# Patient Record
Sex: Female | Born: 1935
Health system: Southern US, Community
[De-identification: ages and names within clinical notes are randomized; demographics above are authoritative.]

## PROBLEM LIST (undated history)

## (undated) DIAGNOSIS — K219 Gastro-esophageal reflux disease without esophagitis: Secondary | ICD-10-CM

## (undated) DIAGNOSIS — Z8739 Personal history of other diseases of the musculoskeletal system and connective tissue: Secondary | ICD-10-CM

## (undated) DIAGNOSIS — I639 Cerebral infarction, unspecified: Secondary | ICD-10-CM

## (undated) DIAGNOSIS — K222 Esophageal obstruction: Secondary | ICD-10-CM

## (undated) DIAGNOSIS — D126 Benign neoplasm of colon, unspecified: Secondary | ICD-10-CM

## (undated) DIAGNOSIS — I495 Sick sinus syndrome: Secondary | ICD-10-CM

## (undated) DIAGNOSIS — K2210 Ulcer of esophagus without bleeding: Secondary | ICD-10-CM

## (undated) DIAGNOSIS — M1990 Unspecified osteoarthritis, unspecified site: Secondary | ICD-10-CM

## (undated) DIAGNOSIS — I2510 Atherosclerotic heart disease of native coronary artery without angina pectoris: Secondary | ICD-10-CM

## (undated) DIAGNOSIS — K449 Diaphragmatic hernia without obstruction or gangrene: Secondary | ICD-10-CM

## (undated) DIAGNOSIS — E785 Hyperlipidemia, unspecified: Secondary | ICD-10-CM

## (undated) DIAGNOSIS — E039 Hypothyroidism, unspecified: Secondary | ICD-10-CM

## (undated) DIAGNOSIS — K5790 Diverticulosis of intestine, part unspecified, without perforation or abscess without bleeding: Secondary | ICD-10-CM

## (undated) DIAGNOSIS — I480 Paroxysmal atrial fibrillation: Secondary | ICD-10-CM

## (undated) DIAGNOSIS — I10 Essential (primary) hypertension: Secondary | ICD-10-CM

## (undated) DIAGNOSIS — F0390 Unspecified dementia without behavioral disturbance: Secondary | ICD-10-CM

## (undated) DIAGNOSIS — E669 Obesity, unspecified: Secondary | ICD-10-CM

## (undated) HISTORY — PX: TONSILLECTOMY AND ADENOIDECTOMY: SUR1326

## (undated) HISTORY — DX: Sick sinus syndrome: I49.5

## (undated) HISTORY — DX: Unspecified osteoarthritis, unspecified site: M19.90

## (undated) HISTORY — PX: CHOLECYSTECTOMY: SHX55

## (undated) HISTORY — DX: Paroxysmal atrial fibrillation: I48.0

## (undated) HISTORY — DX: Hyperlipidemia, unspecified: E78.5

## (undated) HISTORY — DX: Hypothyroidism, unspecified: E03.9

## (undated) HISTORY — DX: Benign neoplasm of colon, unspecified: D12.6

## (undated) HISTORY — DX: Diaphragmatic hernia without obstruction or gangrene: K44.9

## (undated) HISTORY — DX: Obesity, unspecified: E66.9

## (undated) HISTORY — DX: Diverticulosis of intestine, part unspecified, without perforation or abscess without bleeding: K57.90

## (undated) HISTORY — PX: APPENDECTOMY: SHX54

## (undated) HISTORY — DX: Essential (primary) hypertension: I10

## (undated) HISTORY — DX: Gastro-esophageal reflux disease without esophagitis: K21.9

## (undated) HISTORY — DX: Ulcer of esophagus without bleeding: K22.10

## (undated) HISTORY — PX: TOTAL ABDOMINAL HYSTERECTOMY W/ BILATERAL SALPINGOOPHORECTOMY: SHX83

## (undated) HISTORY — DX: Cerebral infarction, unspecified: I63.9

## (undated) HISTORY — DX: Atherosclerotic heart disease of native coronary artery without angina pectoris: I25.10

## (undated) HISTORY — DX: Esophageal obstruction: K22.2

---

## 1997-01-17 ENCOUNTER — Encounter: Payer: Self-pay | Admitting: Gastroenterology

## 1998-02-13 ENCOUNTER — Other Ambulatory Visit: Admission: RE | Admit: 1998-02-13 | Discharge: 1998-02-13 | Payer: Self-pay | Admitting: Radiology

## 1998-03-29 ENCOUNTER — Encounter: Payer: Self-pay | Admitting: Gastroenterology

## 1999-03-24 ENCOUNTER — Encounter: Payer: Self-pay | Admitting: Internal Medicine

## 1999-03-24 ENCOUNTER — Ambulatory Visit (HOSPITAL_COMMUNITY): Admission: RE | Admit: 1999-03-24 | Discharge: 1999-03-24 | Payer: Self-pay | Admitting: Internal Medicine

## 1999-10-02 ENCOUNTER — Encounter: Admission: RE | Admit: 1999-10-02 | Discharge: 1999-11-04 | Payer: Self-pay | Admitting: Rheumatology

## 2000-02-22 HISTORY — PX: CORONARY ARTERY BYPASS GRAFT: SHX141

## 2000-03-13 ENCOUNTER — Encounter: Payer: Self-pay | Admitting: Emergency Medicine

## 2000-03-13 ENCOUNTER — Inpatient Hospital Stay (HOSPITAL_COMMUNITY): Admission: EM | Admit: 2000-03-13 | Discharge: 2000-03-26 | Payer: Self-pay | Admitting: Emergency Medicine

## 2000-03-16 ENCOUNTER — Encounter: Payer: Self-pay | Admitting: Internal Medicine

## 2000-03-16 ENCOUNTER — Encounter: Payer: Self-pay | Admitting: Cardiology

## 2000-03-18 ENCOUNTER — Encounter: Payer: Self-pay | Admitting: Thoracic Surgery (Cardiothoracic Vascular Surgery)

## 2000-03-19 ENCOUNTER — Encounter: Payer: Self-pay | Admitting: Thoracic Surgery (Cardiothoracic Vascular Surgery)

## 2000-03-20 ENCOUNTER — Encounter: Payer: Self-pay | Admitting: Thoracic Surgery (Cardiothoracic Vascular Surgery)

## 2000-03-21 ENCOUNTER — Encounter: Payer: Self-pay | Admitting: Thoracic Surgery (Cardiothoracic Vascular Surgery)

## 2000-03-22 ENCOUNTER — Encounter: Payer: Self-pay | Admitting: Thoracic Surgery (Cardiothoracic Vascular Surgery)

## 2000-03-24 ENCOUNTER — Encounter: Payer: Self-pay | Admitting: *Deleted

## 2000-03-25 HISTORY — PX: PACEMAKER PLACEMENT: SHX43

## 2000-03-26 ENCOUNTER — Encounter: Payer: Self-pay | Admitting: Cardiology

## 2000-04-12 ENCOUNTER — Encounter: Admission: RE | Admit: 2000-04-12 | Discharge: 2000-07-11 | Payer: Self-pay | Admitting: Internal Medicine

## 2000-05-18 ENCOUNTER — Encounter (HOSPITAL_COMMUNITY): Admission: RE | Admit: 2000-05-18 | Discharge: 2000-08-16 | Payer: Self-pay | Admitting: *Deleted

## 2001-02-11 ENCOUNTER — Encounter: Payer: Self-pay | Admitting: Emergency Medicine

## 2001-02-11 ENCOUNTER — Inpatient Hospital Stay (HOSPITAL_COMMUNITY): Admission: EM | Admit: 2001-02-11 | Discharge: 2001-02-13 | Payer: Self-pay | Admitting: Emergency Medicine

## 2001-02-13 ENCOUNTER — Encounter: Payer: Self-pay | Admitting: *Deleted

## 2001-11-23 HISTORY — PX: COLONOSCOPY W/ POLYPECTOMY: SHX1380

## 2002-07-24 DIAGNOSIS — D126 Benign neoplasm of colon, unspecified: Secondary | ICD-10-CM

## 2002-07-24 HISTORY — DX: Benign neoplasm of colon, unspecified: D12.6

## 2002-07-26 ENCOUNTER — Encounter: Payer: Self-pay | Admitting: Gastroenterology

## 2002-11-23 HISTORY — PX: REPLACEMENT TOTAL KNEE: SUR1224

## 2003-01-24 ENCOUNTER — Encounter: Payer: Self-pay | Admitting: Gastroenterology

## 2003-05-30 ENCOUNTER — Encounter: Admission: RE | Admit: 2003-05-30 | Discharge: 2003-05-30 | Payer: Self-pay | Admitting: Orthopedic Surgery

## 2003-05-30 ENCOUNTER — Encounter: Payer: Self-pay | Admitting: Orthopedic Surgery

## 2003-06-29 ENCOUNTER — Encounter: Payer: Self-pay | Admitting: Cardiology

## 2003-07-13 ENCOUNTER — Encounter: Payer: Self-pay | Admitting: Orthopedic Surgery

## 2003-07-18 ENCOUNTER — Inpatient Hospital Stay (HOSPITAL_COMMUNITY): Admission: RE | Admit: 2003-07-18 | Discharge: 2003-07-22 | Payer: Self-pay | Admitting: Orthopedic Surgery

## 2003-07-18 ENCOUNTER — Encounter: Payer: Self-pay | Admitting: Orthopedic Surgery

## 2003-07-20 ENCOUNTER — Encounter: Payer: Self-pay | Admitting: Orthopedic Surgery

## 2003-07-22 ENCOUNTER — Inpatient Hospital Stay (HOSPITAL_COMMUNITY)
Admission: RE | Admit: 2003-07-22 | Discharge: 2003-07-28 | Payer: Self-pay | Admitting: Physical Medicine & Rehabilitation

## 2004-06-10 ENCOUNTER — Encounter: Admission: RE | Admit: 2004-06-10 | Discharge: 2004-06-10 | Payer: Self-pay | Admitting: Otolaryngology

## 2004-06-11 ENCOUNTER — Ambulatory Visit (HOSPITAL_BASED_OUTPATIENT_CLINIC_OR_DEPARTMENT_OTHER): Admission: RE | Admit: 2004-06-11 | Discharge: 2004-06-11 | Payer: Self-pay | Admitting: Otolaryngology

## 2004-06-11 ENCOUNTER — Ambulatory Visit (HOSPITAL_COMMUNITY): Admission: RE | Admit: 2004-06-11 | Discharge: 2004-06-11 | Payer: Self-pay | Admitting: Otolaryngology

## 2004-12-20 ENCOUNTER — Ambulatory Visit: Payer: Self-pay | Admitting: Cardiology

## 2004-12-26 ENCOUNTER — Ambulatory Visit: Payer: Self-pay

## 2005-01-29 ENCOUNTER — Ambulatory Visit: Payer: Self-pay | Admitting: Cardiology

## 2005-03-03 ENCOUNTER — Ambulatory Visit: Payer: Self-pay | Admitting: Internal Medicine

## 2005-03-18 ENCOUNTER — Ambulatory Visit: Payer: Self-pay | Admitting: Cardiology

## 2005-04-17 ENCOUNTER — Ambulatory Visit: Payer: Self-pay | Admitting: Cardiology

## 2005-05-23 HISTORY — PX: CATARACT EXTRACTION: SUR2

## 2005-05-29 ENCOUNTER — Ambulatory Visit: Payer: Self-pay | Admitting: Cardiology

## 2005-06-30 ENCOUNTER — Ambulatory Visit: Payer: Self-pay | Admitting: Cardiology

## 2005-07-24 ENCOUNTER — Ambulatory Visit: Payer: Self-pay | Admitting: Gastroenterology

## 2005-08-05 ENCOUNTER — Ambulatory Visit: Payer: Self-pay | Admitting: Cardiology

## 2005-08-06 ENCOUNTER — Ambulatory Visit: Payer: Self-pay | Admitting: Gastroenterology

## 2005-08-06 ENCOUNTER — Encounter (INDEPENDENT_AMBULATORY_CARE_PROVIDER_SITE_OTHER): Payer: Self-pay | Admitting: Specialist

## 2005-09-17 ENCOUNTER — Ambulatory Visit: Payer: Self-pay | Admitting: Internal Medicine

## 2005-10-01 ENCOUNTER — Ambulatory Visit: Payer: Self-pay | Admitting: Internal Medicine

## 2005-10-06 ENCOUNTER — Ambulatory Visit: Payer: Self-pay | Admitting: Cardiology

## 2005-10-27 ENCOUNTER — Encounter: Admission: RE | Admit: 2005-10-27 | Discharge: 2005-11-22 | Payer: Self-pay | Admitting: Internal Medicine

## 2005-11-05 ENCOUNTER — Ambulatory Visit: Payer: Self-pay | Admitting: Cardiology

## 2005-11-26 ENCOUNTER — Ambulatory Visit: Payer: Self-pay | Admitting: Cardiology

## 2005-12-24 ENCOUNTER — Ambulatory Visit: Payer: Self-pay | Admitting: Cardiology

## 2006-01-26 ENCOUNTER — Ambulatory Visit: Payer: Self-pay | Admitting: Cardiology

## 2006-02-24 ENCOUNTER — Ambulatory Visit: Payer: Self-pay | Admitting: Cardiology

## 2006-04-06 ENCOUNTER — Ambulatory Visit: Payer: Self-pay | Admitting: Cardiology

## 2006-04-08 ENCOUNTER — Ambulatory Visit: Payer: Self-pay | Admitting: Internal Medicine

## 2006-05-10 ENCOUNTER — Ambulatory Visit: Payer: Self-pay | Admitting: Cardiology

## 2006-06-15 ENCOUNTER — Ambulatory Visit: Payer: Self-pay | Admitting: Cardiology

## 2006-07-21 ENCOUNTER — Ambulatory Visit: Payer: Self-pay | Admitting: Cardiology

## 2006-08-18 ENCOUNTER — Ambulatory Visit: Payer: Self-pay | Admitting: Cardiology

## 2006-09-15 ENCOUNTER — Ambulatory Visit: Payer: Self-pay | Admitting: Cardiology

## 2006-10-11 ENCOUNTER — Ambulatory Visit: Payer: Self-pay | Admitting: Internal Medicine

## 2006-10-11 LAB — CONVERTED CEMR LAB
Chol/HDL Ratio, serum: 4.1
Creatinine,U: 95.7 mg/dL
Microalb, Ur: 0.6 mg/dL (ref 0.0–1.9)
Potassium: 3.4 meq/L — ABNORMAL LOW (ref 3.5–5.1)
Triglyceride fasting, serum: 261 mg/dL (ref 0–149)

## 2006-10-19 ENCOUNTER — Ambulatory Visit: Payer: Self-pay | Admitting: Cardiology

## 2006-11-11 ENCOUNTER — Ambulatory Visit: Payer: Self-pay | Admitting: Cardiology

## 2006-11-18 ENCOUNTER — Ambulatory Visit: Payer: Self-pay | Admitting: Internal Medicine

## 2006-11-19 ENCOUNTER — Ambulatory Visit: Payer: Self-pay

## 2006-12-02 ENCOUNTER — Encounter (INDEPENDENT_AMBULATORY_CARE_PROVIDER_SITE_OTHER): Payer: Self-pay | Admitting: *Deleted

## 2006-12-02 ENCOUNTER — Ambulatory Visit: Payer: Self-pay | Admitting: Cardiology

## 2006-12-03 ENCOUNTER — Ambulatory Visit: Payer: Self-pay | Admitting: Cardiology

## 2007-01-06 ENCOUNTER — Ambulatory Visit: Payer: Self-pay | Admitting: Cardiology

## 2007-02-03 ENCOUNTER — Ambulatory Visit: Payer: Self-pay | Admitting: Cardiology

## 2007-02-17 ENCOUNTER — Ambulatory Visit: Payer: Self-pay | Admitting: Internal Medicine

## 2007-02-17 LAB — CONVERTED CEMR LAB
AST: 27 units/L (ref 0–37)
BUN: 21 mg/dL (ref 6–23)
Creatinine, Ser: 0.9 mg/dL (ref 0.4–1.2)
Direct LDL: 89.4 mg/dL

## 2007-03-03 ENCOUNTER — Ambulatory Visit: Payer: Self-pay | Admitting: Cardiology

## 2007-03-23 DIAGNOSIS — Z860100 Personal history of colon polyps, unspecified: Secondary | ICD-10-CM | POA: Insufficient documentation

## 2007-03-24 ENCOUNTER — Ambulatory Visit: Payer: Self-pay | Admitting: Internal Medicine

## 2007-03-31 ENCOUNTER — Ambulatory Visit: Payer: Self-pay | Admitting: Cardiology

## 2007-04-28 ENCOUNTER — Ambulatory Visit: Payer: Self-pay | Admitting: Cardiology

## 2007-05-01 ENCOUNTER — Encounter: Payer: Self-pay | Admitting: Internal Medicine

## 2007-05-26 ENCOUNTER — Ambulatory Visit: Payer: Self-pay | Admitting: Cardiology

## 2007-05-30 ENCOUNTER — Telehealth (INDEPENDENT_AMBULATORY_CARE_PROVIDER_SITE_OTHER): Payer: Self-pay | Admitting: *Deleted

## 2007-05-30 ENCOUNTER — Encounter: Payer: Self-pay | Admitting: Internal Medicine

## 2007-06-14 ENCOUNTER — Ambulatory Visit: Payer: Self-pay | Admitting: Internal Medicine

## 2007-06-14 DIAGNOSIS — E039 Hypothyroidism, unspecified: Secondary | ICD-10-CM | POA: Insufficient documentation

## 2007-06-17 ENCOUNTER — Encounter (INDEPENDENT_AMBULATORY_CARE_PROVIDER_SITE_OTHER): Payer: Self-pay | Admitting: *Deleted

## 2007-06-17 LAB — CONVERTED CEMR LAB
ALT: 27 units/L (ref 0–35)
AST: 28 units/L (ref 0–37)
HDL: 33.7 mg/dL — ABNORMAL LOW (ref >39.0)
Hgb A1c MFr Bld: 6.8 % — ABNORMAL HIGH (ref 4.6–6.0)
TSH: 6.01 microintl units/mL — ABNORMAL HIGH (ref 0.35–5.50)
Triglycerides: 308 mg/dL (ref 0–149)

## 2007-06-23 ENCOUNTER — Ambulatory Visit: Payer: Self-pay | Admitting: Cardiology

## 2007-07-11 ENCOUNTER — Ambulatory Visit: Payer: Self-pay | Admitting: Internal Medicine

## 2007-07-11 LAB — CONVERTED CEMR LAB: LDL Goal: 100 mg/dL

## 2007-07-21 ENCOUNTER — Ambulatory Visit: Payer: Self-pay | Admitting: Cardiology

## 2007-08-18 ENCOUNTER — Ambulatory Visit: Payer: Self-pay | Admitting: Cardiology

## 2007-08-24 ENCOUNTER — Telehealth (INDEPENDENT_AMBULATORY_CARE_PROVIDER_SITE_OTHER): Payer: Self-pay | Admitting: *Deleted

## 2007-09-16 ENCOUNTER — Ambulatory Visit: Payer: Self-pay | Admitting: Cardiology

## 2007-09-27 ENCOUNTER — Encounter: Payer: Self-pay | Admitting: Internal Medicine

## 2007-10-12 ENCOUNTER — Ambulatory Visit: Payer: Self-pay | Admitting: Cardiology

## 2007-10-13 ENCOUNTER — Ambulatory Visit: Payer: Self-pay | Admitting: Internal Medicine

## 2007-10-17 ENCOUNTER — Encounter (INDEPENDENT_AMBULATORY_CARE_PROVIDER_SITE_OTHER): Payer: Self-pay | Admitting: *Deleted

## 2007-10-25 ENCOUNTER — Ambulatory Visit: Payer: Self-pay | Admitting: Internal Medicine

## 2007-10-25 DIAGNOSIS — I2510 Atherosclerotic heart disease of native coronary artery without angina pectoris: Secondary | ICD-10-CM

## 2007-11-10 ENCOUNTER — Ambulatory Visit: Payer: Self-pay | Admitting: Cardiology

## 2007-11-24 HISTORY — PX: ROTATOR CUFF REPAIR: SHX139

## 2007-12-08 ENCOUNTER — Ambulatory Visit: Payer: Self-pay | Admitting: Cardiology

## 2007-12-29 ENCOUNTER — Ambulatory Visit: Payer: Self-pay | Admitting: Internal Medicine

## 2007-12-31 LAB — CONVERTED CEMR LAB
Creatinine,U: 62 mg/dL
Hgb A1c MFr Bld: 6 % (ref 4.6–6.0)
Microalb, Ur: 1.7 mg/dL (ref 0.0–1.9)

## 2008-01-02 ENCOUNTER — Encounter (INDEPENDENT_AMBULATORY_CARE_PROVIDER_SITE_OTHER): Payer: Self-pay | Admitting: *Deleted

## 2008-01-02 ENCOUNTER — Ambulatory Visit: Payer: Self-pay | Admitting: Cardiology

## 2008-01-05 ENCOUNTER — Ambulatory Visit: Payer: Self-pay | Admitting: Internal Medicine

## 2008-01-09 ENCOUNTER — Telehealth: Payer: Self-pay | Admitting: Internal Medicine

## 2008-03-07 ENCOUNTER — Ambulatory Visit: Payer: Self-pay | Admitting: Internal Medicine

## 2008-03-12 ENCOUNTER — Encounter (INDEPENDENT_AMBULATORY_CARE_PROVIDER_SITE_OTHER): Payer: Self-pay | Admitting: *Deleted

## 2008-03-15 ENCOUNTER — Ambulatory Visit: Payer: Self-pay | Admitting: Cardiology

## 2008-05-08 ENCOUNTER — Encounter: Admission: RE | Admit: 2008-05-08 | Discharge: 2008-05-08 | Payer: Self-pay | Admitting: Orthopedic Surgery

## 2008-06-05 ENCOUNTER — Encounter: Payer: Self-pay | Admitting: Internal Medicine

## 2008-06-05 ENCOUNTER — Encounter: Payer: Self-pay | Admitting: Cardiology

## 2008-06-06 ENCOUNTER — Ambulatory Visit: Payer: Self-pay | Admitting: Cardiology

## 2008-06-06 ENCOUNTER — Ambulatory Visit: Payer: Self-pay

## 2008-06-11 ENCOUNTER — Telehealth (INDEPENDENT_AMBULATORY_CARE_PROVIDER_SITE_OTHER): Payer: Self-pay | Admitting: *Deleted

## 2008-06-15 ENCOUNTER — Encounter: Payer: Self-pay | Admitting: Internal Medicine

## 2008-06-19 ENCOUNTER — Telehealth (INDEPENDENT_AMBULATORY_CARE_PROVIDER_SITE_OTHER): Payer: Self-pay | Admitting: *Deleted

## 2008-06-26 ENCOUNTER — Ambulatory Visit: Payer: Self-pay | Admitting: Internal Medicine

## 2008-06-27 ENCOUNTER — Telehealth (INDEPENDENT_AMBULATORY_CARE_PROVIDER_SITE_OTHER): Payer: Self-pay | Admitting: *Deleted

## 2008-07-02 ENCOUNTER — Encounter (INDEPENDENT_AMBULATORY_CARE_PROVIDER_SITE_OTHER): Payer: Self-pay | Admitting: *Deleted

## 2008-07-02 LAB — CONVERTED CEMR LAB: Hgb A1c MFr Bld: 6.5 % — ABNORMAL HIGH (ref 4.6–6.0)

## 2008-07-04 ENCOUNTER — Ambulatory Visit (HOSPITAL_COMMUNITY): Admission: RE | Admit: 2008-07-04 | Discharge: 2008-07-05 | Payer: Self-pay | Admitting: Orthopedic Surgery

## 2008-08-10 ENCOUNTER — Ambulatory Visit: Payer: Self-pay | Admitting: Gastroenterology

## 2008-08-14 ENCOUNTER — Ambulatory Visit: Payer: Self-pay | Admitting: Internal Medicine

## 2008-08-22 ENCOUNTER — Encounter: Payer: Self-pay | Admitting: Gastroenterology

## 2008-08-22 ENCOUNTER — Ambulatory Visit: Payer: Self-pay | Admitting: Gastroenterology

## 2008-08-22 LAB — HM COLONOSCOPY

## 2008-08-23 ENCOUNTER — Encounter: Payer: Self-pay | Admitting: Gastroenterology

## 2008-08-23 ENCOUNTER — Telehealth: Payer: Self-pay | Admitting: Gastroenterology

## 2008-09-07 ENCOUNTER — Ambulatory Visit: Payer: Self-pay | Admitting: Cardiology

## 2008-09-16 DIAGNOSIS — I495 Sick sinus syndrome: Secondary | ICD-10-CM | POA: Insufficient documentation

## 2008-09-16 DIAGNOSIS — Z950 Presence of cardiac pacemaker: Secondary | ICD-10-CM

## 2008-09-16 DIAGNOSIS — I25810 Atherosclerosis of coronary artery bypass graft(s) without angina pectoris: Secondary | ICD-10-CM

## 2008-09-16 DIAGNOSIS — E785 Hyperlipidemia, unspecified: Secondary | ICD-10-CM

## 2008-09-16 DIAGNOSIS — I10 Essential (primary) hypertension: Secondary | ICD-10-CM

## 2008-09-20 ENCOUNTER — Telehealth (INDEPENDENT_AMBULATORY_CARE_PROVIDER_SITE_OTHER): Payer: Self-pay | Admitting: *Deleted

## 2008-09-27 ENCOUNTER — Telehealth (INDEPENDENT_AMBULATORY_CARE_PROVIDER_SITE_OTHER): Payer: Self-pay | Admitting: *Deleted

## 2008-09-28 ENCOUNTER — Encounter: Payer: Self-pay | Admitting: Internal Medicine

## 2008-12-07 ENCOUNTER — Ambulatory Visit: Payer: Self-pay | Admitting: Cardiology

## 2008-12-21 ENCOUNTER — Encounter (INDEPENDENT_AMBULATORY_CARE_PROVIDER_SITE_OTHER): Payer: Self-pay | Admitting: *Deleted

## 2009-01-09 ENCOUNTER — Telehealth (INDEPENDENT_AMBULATORY_CARE_PROVIDER_SITE_OTHER): Payer: Self-pay | Admitting: *Deleted

## 2009-01-10 ENCOUNTER — Ambulatory Visit: Payer: Self-pay | Admitting: Cardiology

## 2009-01-10 ENCOUNTER — Ambulatory Visit: Payer: Self-pay | Admitting: Internal Medicine

## 2009-01-23 ENCOUNTER — Telehealth (INDEPENDENT_AMBULATORY_CARE_PROVIDER_SITE_OTHER): Payer: Self-pay | Admitting: *Deleted

## 2009-01-27 LAB — CONVERTED CEMR LAB
ALT: 26 units/L (ref 0–35)
AST: 27 units/L (ref 0–37)
Alkaline Phosphatase: 22 units/L — ABNORMAL LOW (ref 39–117)
Creatinine, Ser: 1.3 mg/dL — ABNORMAL HIGH (ref 0.4–1.2)
Microalb, Ur: 3 mg/dL — ABNORMAL HIGH (ref 0.0–1.9)
Potassium: 3.9 meq/L (ref 3.5–5.1)
Total Bilirubin: 0.6 mg/dL (ref 0.3–1.2)
Total CHOL/HDL Ratio: 4.7
Triglycerides: 298 mg/dL (ref 0–149)

## 2009-01-28 ENCOUNTER — Ambulatory Visit: Payer: Self-pay | Admitting: Internal Medicine

## 2009-03-07 ENCOUNTER — Telehealth (INDEPENDENT_AMBULATORY_CARE_PROVIDER_SITE_OTHER): Payer: Self-pay | Admitting: *Deleted

## 2009-03-08 ENCOUNTER — Telehealth (INDEPENDENT_AMBULATORY_CARE_PROVIDER_SITE_OTHER): Payer: Self-pay | Admitting: *Deleted

## 2009-03-08 ENCOUNTER — Ambulatory Visit: Payer: Self-pay | Admitting: Cardiology

## 2009-03-28 ENCOUNTER — Encounter: Payer: Self-pay | Admitting: Internal Medicine

## 2009-05-31 ENCOUNTER — Ambulatory Visit: Payer: Self-pay | Admitting: Internal Medicine

## 2009-06-07 ENCOUNTER — Ambulatory Visit: Payer: Self-pay | Admitting: Cardiology

## 2009-06-09 LAB — CONVERTED CEMR LAB
Cholesterol: 247 mg/dL — ABNORMAL HIGH (ref 0–200)
Creatinine, Ser: 1.4 mg/dL — ABNORMAL HIGH (ref 0.4–1.2)
Direct LDL: 113.9 mg/dL
HDL: 42.1 mg/dL (ref >39.00)
Triglycerides: 423 mg/dL — ABNORMAL HIGH (ref 0.0–149.0)

## 2009-06-11 ENCOUNTER — Encounter (INDEPENDENT_AMBULATORY_CARE_PROVIDER_SITE_OTHER): Payer: Self-pay | Admitting: *Deleted

## 2009-06-12 ENCOUNTER — Telehealth (INDEPENDENT_AMBULATORY_CARE_PROVIDER_SITE_OTHER): Payer: Self-pay | Admitting: *Deleted

## 2009-06-25 ENCOUNTER — Ambulatory Visit: Payer: Self-pay | Admitting: Internal Medicine

## 2009-06-25 DIAGNOSIS — R079 Chest pain, unspecified: Secondary | ICD-10-CM

## 2009-06-26 ENCOUNTER — Encounter: Payer: Self-pay | Admitting: Internal Medicine

## 2009-06-26 ENCOUNTER — Encounter (INDEPENDENT_AMBULATORY_CARE_PROVIDER_SITE_OTHER): Payer: Self-pay | Admitting: *Deleted

## 2009-06-26 LAB — CONVERTED CEMR LAB
CK-MB: 2.9 ng/mL (ref 0.3–4.0)
Total CK: 104 units/L (ref 7–177)

## 2009-06-27 ENCOUNTER — Encounter (INDEPENDENT_AMBULATORY_CARE_PROVIDER_SITE_OTHER): Payer: Self-pay | Admitting: *Deleted

## 2009-07-31 ENCOUNTER — Encounter: Payer: Self-pay | Admitting: Internal Medicine

## 2009-08-27 ENCOUNTER — Ambulatory Visit: Payer: Self-pay | Admitting: Internal Medicine

## 2009-08-27 DIAGNOSIS — R319 Hematuria, unspecified: Secondary | ICD-10-CM

## 2009-08-27 LAB — CONVERTED CEMR LAB
Glucose, Urine, Semiquant: NEGATIVE
Specific Gravity, Urine: <1.005
pH: 7.5

## 2009-08-30 ENCOUNTER — Telehealth (INDEPENDENT_AMBULATORY_CARE_PROVIDER_SITE_OTHER): Payer: Self-pay | Admitting: *Deleted

## 2009-09-06 ENCOUNTER — Ambulatory Visit: Payer: Self-pay | Admitting: Cardiology

## 2009-09-23 ENCOUNTER — Telehealth (INDEPENDENT_AMBULATORY_CARE_PROVIDER_SITE_OTHER): Payer: Self-pay | Admitting: *Deleted

## 2009-09-30 ENCOUNTER — Encounter: Payer: Self-pay | Admitting: Internal Medicine

## 2009-10-10 ENCOUNTER — Ambulatory Visit: Payer: Self-pay | Admitting: Internal Medicine

## 2009-10-13 LAB — CONVERTED CEMR LAB
Cholesterol: 196 mg/dL (ref 0–200)
Direct LDL: 80.4 mg/dL
HDL: 38.8 mg/dL — ABNORMAL LOW (ref >39.00)
Hgb A1c MFr Bld: 6 % (ref 4.6–6.5)

## 2009-10-14 ENCOUNTER — Encounter (INDEPENDENT_AMBULATORY_CARE_PROVIDER_SITE_OTHER): Payer: Self-pay | Admitting: *Deleted

## 2009-11-13 ENCOUNTER — Ambulatory Visit: Payer: Self-pay | Admitting: Internal Medicine

## 2009-11-13 DIAGNOSIS — R1032 Left lower quadrant pain: Secondary | ICD-10-CM

## 2009-11-18 LAB — CONVERTED CEMR LAB
Basophils Absolute: 0.1 10*3/uL (ref 0.0–0.1)
Eosinophils Relative: 2.7 % (ref 0.0–5.0)
Hemoglobin: 10.9 g/dL — ABNORMAL LOW (ref 12.0–15.0)
MCHC: 33.7 g/dL (ref 30.0–36.0)
Monocytes Absolute: 0.6 10*3/uL (ref 0.1–1.0)
Neutrophils Relative %: 49.9 % (ref 43.0–77.0)
Platelets: 198 10*3/uL (ref 150.0–400.0)
RBC: 3.46 M/uL — ABNORMAL LOW (ref 3.87–5.11)
RDW: 12.3 % (ref 11.5–14.6)

## 2009-11-27 ENCOUNTER — Ambulatory Visit: Payer: Self-pay | Admitting: Internal Medicine

## 2009-11-27 LAB — CONVERTED CEMR LAB: OCCULT 3: NEGATIVE

## 2009-11-28 ENCOUNTER — Encounter (INDEPENDENT_AMBULATORY_CARE_PROVIDER_SITE_OTHER): Payer: Self-pay | Admitting: *Deleted

## 2009-11-29 ENCOUNTER — Ambulatory Visit: Payer: Self-pay | Admitting: Internal Medicine

## 2009-12-02 LAB — CONVERTED CEMR LAB
Eosinophils Relative: 2.1 % (ref 0.0–5.0)
Folate: >20 ng/mL
Lymphs Abs: 2.2 10*3/uL (ref 0.7–4.0)
MCHC: 32.4 g/dL (ref 30.0–36.0)
MCV: 93.3 fL (ref 78.0–100.0)
Monocytes Absolute: 0.6 10*3/uL (ref 0.1–1.0)
Monocytes Relative: 9.5 % (ref 3.0–12.0)
Neutro Abs: 3.8 10*3/uL (ref 1.4–7.7)
Platelets: 179 10*3/uL (ref 150.0–400.0)
RBC: 3.41 M/uL — ABNORMAL LOW (ref 3.87–5.11)
RDW: 12.4 % (ref 11.5–14.6)
Transferrin: 207.6 mg/dL — ABNORMAL LOW (ref 212.0–360.0)
Vitamin B-12: 238 pg/mL (ref 211–911)

## 2009-12-06 ENCOUNTER — Ambulatory Visit: Payer: Self-pay | Admitting: Cardiology

## 2009-12-26 ENCOUNTER — Ambulatory Visit: Payer: Self-pay | Admitting: Cardiology

## 2009-12-27 ENCOUNTER — Telehealth (INDEPENDENT_AMBULATORY_CARE_PROVIDER_SITE_OTHER): Payer: Self-pay | Admitting: *Deleted

## 2010-01-05 ENCOUNTER — Encounter: Payer: Self-pay | Admitting: Internal Medicine

## 2010-01-07 ENCOUNTER — Ambulatory Visit: Payer: Self-pay | Admitting: Internal Medicine

## 2010-01-13 LAB — CONVERTED CEMR LAB
Cholesterol: 153 mg/dL (ref 0–200)
Microalb Creat Ratio: 10.9 mg/g (ref 0.0–30.0)
Microalb, Ur: 1.1 mg/dL (ref 0.0–1.9)
Total CHOL/HDL Ratio: 4
Triglycerides: 385 mg/dL — ABNORMAL HIGH (ref 0.0–149.0)
VLDL: 77 mg/dL — ABNORMAL HIGH (ref 0.0–40.0)

## 2010-01-14 ENCOUNTER — Encounter (INDEPENDENT_AMBULATORY_CARE_PROVIDER_SITE_OTHER): Payer: Self-pay | Admitting: *Deleted

## 2010-01-14 ENCOUNTER — Ambulatory Visit: Payer: Self-pay | Admitting: Internal Medicine

## 2010-01-14 DIAGNOSIS — D649 Anemia, unspecified: Secondary | ICD-10-CM

## 2010-02-13 ENCOUNTER — Ambulatory Visit: Payer: Self-pay | Admitting: Gastroenterology

## 2010-02-13 DIAGNOSIS — K5900 Constipation, unspecified: Secondary | ICD-10-CM | POA: Insufficient documentation

## 2010-02-13 DIAGNOSIS — R109 Unspecified abdominal pain: Secondary | ICD-10-CM | POA: Insufficient documentation

## 2010-03-07 ENCOUNTER — Ambulatory Visit: Payer: Self-pay | Admitting: Cardiology

## 2010-03-19 ENCOUNTER — Ambulatory Visit: Payer: Self-pay | Admitting: Internal Medicine

## 2010-03-19 DIAGNOSIS — R531 Weakness: Secondary | ICD-10-CM

## 2010-03-24 ENCOUNTER — Encounter: Payer: Self-pay | Admitting: Internal Medicine

## 2010-03-24 ENCOUNTER — Telehealth: Payer: Self-pay | Admitting: Internal Medicine

## 2010-04-07 ENCOUNTER — Telehealth (INDEPENDENT_AMBULATORY_CARE_PROVIDER_SITE_OTHER): Payer: Self-pay | Admitting: *Deleted

## 2010-04-07 ENCOUNTER — Encounter: Payer: Self-pay | Admitting: Internal Medicine

## 2010-04-08 ENCOUNTER — Ambulatory Visit: Payer: Self-pay | Admitting: Internal Medicine

## 2010-04-09 ENCOUNTER — Encounter: Admission: RE | Admit: 2010-04-09 | Discharge: 2010-04-09 | Payer: Self-pay | Admitting: Neurology

## 2010-04-12 LAB — CONVERTED CEMR LAB
HDL: 41.2 mg/dL (ref >39.00)
Total CHOL/HDL Ratio: 5
Total Protein: 6.6 g/dL (ref 6.0–8.3)
Triglycerides: 464 mg/dL — ABNORMAL HIGH (ref 0.0–149.0)
VLDL: 92.8 mg/dL — ABNORMAL HIGH (ref 0.0–40.0)

## 2010-04-14 ENCOUNTER — Ambulatory Visit: Payer: Self-pay | Admitting: Internal Medicine

## 2010-04-14 DIAGNOSIS — R1013 Epigastric pain: Secondary | ICD-10-CM

## 2010-04-14 DIAGNOSIS — K3189 Other diseases of stomach and duodenum: Secondary | ICD-10-CM

## 2010-04-24 ENCOUNTER — Ambulatory Visit (HOSPITAL_COMMUNITY): Admission: RE | Admit: 2010-04-24 | Discharge: 2010-04-24 | Payer: Self-pay | Admitting: Neurology

## 2010-04-24 ENCOUNTER — Encounter (INDEPENDENT_AMBULATORY_CARE_PROVIDER_SITE_OTHER): Payer: Self-pay | Admitting: Neurology

## 2010-04-24 ENCOUNTER — Telehealth (INDEPENDENT_AMBULATORY_CARE_PROVIDER_SITE_OTHER): Payer: Self-pay | Admitting: *Deleted

## 2010-04-24 ENCOUNTER — Ambulatory Visit: Payer: Self-pay

## 2010-04-24 ENCOUNTER — Ambulatory Visit: Payer: Self-pay | Admitting: Internal Medicine

## 2010-04-25 ENCOUNTER — Encounter: Payer: Self-pay | Admitting: Internal Medicine

## 2010-04-25 ENCOUNTER — Telehealth (INDEPENDENT_AMBULATORY_CARE_PROVIDER_SITE_OTHER): Payer: Self-pay | Admitting: *Deleted

## 2010-04-29 ENCOUNTER — Telehealth (INDEPENDENT_AMBULATORY_CARE_PROVIDER_SITE_OTHER): Payer: Self-pay | Admitting: *Deleted

## 2010-04-30 ENCOUNTER — Ambulatory Visit: Payer: Self-pay | Admitting: Internal Medicine

## 2010-04-30 DIAGNOSIS — K219 Gastro-esophageal reflux disease without esophagitis: Secondary | ICD-10-CM

## 2010-04-30 DIAGNOSIS — Z8679 Personal history of other diseases of the circulatory system: Secondary | ICD-10-CM | POA: Insufficient documentation

## 2010-05-20 ENCOUNTER — Encounter: Payer: Self-pay | Admitting: Internal Medicine

## 2010-06-06 ENCOUNTER — Ambulatory Visit: Payer: Self-pay | Admitting: Cardiology

## 2010-07-09 ENCOUNTER — Encounter: Payer: Self-pay | Admitting: Internal Medicine

## 2010-07-15 ENCOUNTER — Ambulatory Visit: Payer: Self-pay | Admitting: Internal Medicine

## 2010-07-18 LAB — CONVERTED CEMR LAB
Basophils Relative: 0.6 % (ref 0.0–3.0)
Cholesterol: 246 mg/dL — ABNORMAL HIGH (ref 0–200)
Direct LDL: 130.8 mg/dL
Eosinophils Relative: 3.5 % (ref 0.0–5.0)
HCT: 32 % — ABNORMAL LOW (ref 36.0–46.0)
Lymphocytes Relative: 52.8 % — ABNORMAL HIGH (ref 12.0–46.0)
MCHC: 33.6 g/dL (ref 30.0–36.0)
MCV: 91.8 fL (ref 78.0–100.0)
Neutro Abs: 1.9 10*3/uL (ref 1.4–7.7)
Neutrophils Relative %: 32.6 % — ABNORMAL LOW (ref 43.0–77.0)
Platelets: 207 10*3/uL (ref 150.0–400.0)
RDW: 13.1 % (ref 11.5–14.6)
Total CHOL/HDL Ratio: 5
VLDL: 68.8 mg/dL — ABNORMAL HIGH (ref 0.0–40.0)
WBC: 5.7 10*3/uL (ref 4.5–10.5)

## 2010-07-22 ENCOUNTER — Ambulatory Visit: Payer: Self-pay | Admitting: Family Medicine

## 2010-07-24 ENCOUNTER — Ambulatory Visit: Payer: Self-pay | Admitting: Internal Medicine

## 2010-07-24 LAB — CONVERTED CEMR LAB
Bilirubin Urine: NEGATIVE
Ketones, urine, test strip: NEGATIVE
Specific Gravity, Urine: <1.005
pH: 7.5

## 2010-08-12 ENCOUNTER — Telehealth (INDEPENDENT_AMBULATORY_CARE_PROVIDER_SITE_OTHER): Payer: Self-pay | Admitting: *Deleted

## 2010-08-13 ENCOUNTER — Encounter: Payer: Self-pay | Admitting: Internal Medicine

## 2010-08-22 ENCOUNTER — Telehealth (INDEPENDENT_AMBULATORY_CARE_PROVIDER_SITE_OTHER): Payer: Self-pay | Admitting: *Deleted

## 2010-08-25 ENCOUNTER — Encounter: Payer: Self-pay | Admitting: Internal Medicine

## 2010-09-05 ENCOUNTER — Encounter: Payer: Self-pay | Admitting: Cardiology

## 2010-09-07 ENCOUNTER — Encounter: Payer: Self-pay | Admitting: Internal Medicine

## 2010-09-16 ENCOUNTER — Ambulatory Visit: Payer: Self-pay | Admitting: Cardiology

## 2010-09-19 ENCOUNTER — Encounter (INDEPENDENT_AMBULATORY_CARE_PROVIDER_SITE_OTHER): Payer: Self-pay | Admitting: *Deleted

## 2010-10-01 ENCOUNTER — Encounter: Payer: Self-pay | Admitting: Internal Medicine

## 2010-10-28 ENCOUNTER — Telehealth: Payer: Self-pay | Admitting: Internal Medicine

## 2010-11-25 ENCOUNTER — Ambulatory Visit
Admission: RE | Admit: 2010-11-25 | Discharge: 2010-11-25 | Payer: Self-pay | Source: Home / Self Care | Attending: Internal Medicine | Admitting: Internal Medicine

## 2010-12-01 LAB — CONVERTED CEMR LAB
Direct LDL: 159.5 mg/dL
HDL: 44.6 mg/dL (ref >39.00)
Hgb A1c MFr Bld: 6.4 % (ref 4.6–6.5)
Total CHOL/HDL Ratio: 6

## 2010-12-02 ENCOUNTER — Ambulatory Visit
Admission: RE | Admit: 2010-12-02 | Discharge: 2010-12-02 | Payer: Self-pay | Source: Home / Self Care | Attending: Internal Medicine | Admitting: Internal Medicine

## 2010-12-05 ENCOUNTER — Encounter: Payer: Self-pay | Admitting: Internal Medicine

## 2010-12-16 ENCOUNTER — Ambulatory Visit: Payer: Self-pay | Admitting: Cardiology

## 2010-12-23 ENCOUNTER — Telehealth (INDEPENDENT_AMBULATORY_CARE_PROVIDER_SITE_OTHER): Payer: Self-pay | Admitting: *Deleted

## 2010-12-23 NOTE — Procedures (Signed)
Summary: EGD/[facility]  EGD/[facility]   Imported By: Sherian Rein 02/13/2010 15:09:00  _____________________________________________________________________  External Attachment:    Type:   Image     Comment:   External Document

## 2010-12-23 NOTE — Cardiovascular Report (Signed)
Summary: TTM   TTM   Imported By: Roderic Ovens 08/21/2010 16:13:12  _____________________________________________________________________  External Attachment:    Type:   Image     Comment:   External Document

## 2010-12-23 NOTE — Assessment & Plan Note (Signed)
Summary: sinus infection??/kn   Vital Signs:  Patient profile:   77 year old female Weight:      178.6 pounds O2 Sat:      98 % on Room air Temp:     98.3 degrees F oral Pulse rate:   83 / minute Pulse rhythm:   regular BP sitting:   120 / 60  (left arm) Cuff size:   regular  Vitals Entered By: Almeta Monas CMA Duncan Dull) (July 22, 2010 11:17 AM)  O2 Flow:  Room air CC: c/o sinus pressure/tightness, post nasal drip causing a cough and difficulty breathing and post nasal drip, URI symptoms   History of Present Illness:       This is an 77 year old woman who presents with URI symptoms.  The symptoms began 2 weeks ago.  The patient complains of nasal congestion, purulent nasal discharge, sore throat, dry cough, and sick contacts.  The patient denies fever, low-grade fever (<100.5 degrees), fever of 100.5-103 degrees, fever of 103.1-104 degrees, fever to >104 degrees, stiff neck, dyspnea, wheezing, rash, vomiting, diarrhea, use of an antipyretic, and response to antipyretic.  The patient also reports itchy throat and headache.  The patient denies itchy watery eyes, sneezing, seasonal symptoms, response to antihistamine, muscle aches, and severe fatigue.  The patient denies the following risk factors for Strep sinusitis: unilateral facial pain, unilateral nasal discharge, poor response to decongestant, double sickening, tooth pain, Strep exposure, tender adenopathy, and absence of cough.  + b/l sinus pressure and pain.  Pt using otc afrin ---she used almost the whole bottle.    Current Medications (verified): 1)  Glimepiride 2 Mg Tabs (Glimepiride) .... Take One Tablet  Once Daily 2)  Metformin Hcl 1000 Mg  Tabs (Metformin Hcl) .... Take One Tablet Twice Daily 3)  Digoxin 0.125 Mg  Tabs (Digoxin) .... Take One Daily Except 1 and 1/2 Tab Q Wed 4)  Niacin 500 Mg  Tbcr (Niacin) .... Take One Tablet Daily 5)  Fish Oil 1000 Mg Caps (Omega-3 Fatty Acids) .Marland Kitchen.. 1 By Mouth Once Daily 6)  Metoprolol  Succinate 200 Mg  Tb24 (Metoprolol Succinate) .... Take One Tab Daily 7)  Levothyroxine Sodium 25 Mcg  Tabs (Levothyroxine Sodium) .... Take One Tablet Daily Except 1 & 1/2 Weds 8)  Enalapril Maleate 20 Mg  Tabs (Enalapril Maleate) .... Take One Tablet Daily 9)  Plavix 75 Mg Tabs (Clopidogrel Bisulfate) .Marland Kitchen.. 1 By Mouth Once Daily 10)  Januvia 100 Mg  Tabs (Sitagliptin Phosphate) .Marland Kitchen.. 1qd 11)  Hydrochlorothiazide 25 Mg  Tabs (Hydrochlorothiazide) .Marland Kitchen.. 1 By Mouth Once Daily 12)  Calcium 500 Mg Tabs (Calcium Carbonate) .Marland Kitchen.. 1 By Mouth Once Daily 13)  Nitrostat 0.3 Mg Subl (Nitroglycerin) .Marland Kitchen.. 1 Under Tongue As Needed Chest Pain 14)  Antara 130 Mg Caps (Fenofibrate Micronized) .Marland Kitchen.. 1 Once Daily 15)  Zantac 150 Maximum Strength 150 Mg Tabs (Ranitidine Hcl) .Marland Kitchen.. 1 Two Times A Day Pre Meals in Oplace of Omeprazole 16)  Augmentin 875-125 Mg Tabs (Amoxicillin-Pot Clavulanate) .Marland Kitchen.. 1 By Mouth Two Times A Day 17)  Nasonex 50 Mcg/act Susp (Mometasone Furoate) .... 2 Sprays Each Nostril Once Daily  Allergies (verified): 1)  Codeine  Past History:  Past medical, surgical, family and social histories (including risk factors) reviewed for relevance to current acute and chronic problems.  Past Medical History: Reviewed history from 02/13/2010 and no changes required. Diabetes mellitus, type I coronary artery disease 1. Coronary artery disease status post coronary bypass graft surgery  2001. 2. Sick sinus syndrome status post Medtronic DDD pacemaker implanted     with good pacer function not pacer dependent. 3. Hypertension. 4. Diabetes. 5. Hyperlipidemia.  Peptic stricture Erosive esophagitis Sliding hiatal hernia GERD Diverticulosis Adenomatous Colon Polyps 07/2002 Arthritis Hypothyroidism  Past Surgical History: Reviewed history from 02/13/2010 and no changes required. CABG-5 VESSEL-02/2000 PACEMAKER CP 2*- HH 01/2001 t&a APPENDECTOMY CHOLECYSTECTOMY TAH&BSO-ENDOMETIOSIS tkr  RIGHTR- 06/2003 2003 COLONSCOPY-POLYPS 05/2005-CATARACTS SURGERY Knee replacement  Family History: Reviewed history from 02/13/2010 and no changes required. Family History of Colon Cancer:Mother developed at age 30 and a brother at age 47. Family History of Breast Cancer:mother and sister Family History of Prostate Cancer:brother  Social History: Reviewed history from 02/13/2010 and no changes required. she lives with her grandson and his wife.  She does not smoke. 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  Otoscopic examination reveals clear canals, tympanic membranes are intact bilaterally without bulging, retraction, inflammation or discharge. Hearing is grossly normal bilaterally. Nose:  L frontal sinus tenderness, L maxillary sinus tenderness, R frontal sinus tenderness, and R maxillary sinus tenderness.   Mouth:  Oral mucosa and oropharynx without lesions or exudates.  Teeth in good repair. Neck:  No deformities, masses, or tenderness noted. Lungs:  Normal respiratory effort, chest expands symmetrically. Lungs are clear to auscultation, no crackles or wheezes. Heart:  Normal rate and regular rhythm. S1 and S2 normal without gallop, murmur, click, rub or other extra sounds. Psych:  Cognition and judgment appear intact. Alert and cooperative with normal attention span and concentration. No apparent delusions, illusions, hallucinations   Impression & Recommendations:  Problem # 1:  SINUSITIS - ACUTE-NOS (ICD-461.9)  Her updated medication list for this problem includes:    Augmentin 875-125 Mg Tabs (Amoxicillin-pot clavulanate) .Marland Kitchen... 1 by mouth two times a day    Nasonex 50 Mcg/act Susp (Mometasone  furoate) .Marland Kitchen... 2 sprays each nostril once daily  Instructed on treatment. Call if symptoms persist or worsen.   Complete Medication List: 1)  Glimepiride 2 Mg Tabs (Glimepiride) .... Take one tablet  once daily 2)  Metformin Hcl 1000 Mg Tabs (Metformin hcl) .... Take one tablet twice daily 3)  Digoxin 0.125 Mg Tabs (Digoxin) .... Take one daily except 1 and 1/2 tab q wed 4)  Niacin 500 Mg Tbcr (Niacin) .... Take one tablet daily 5)  Fish Oil 1000 Mg Caps (Omega-3 fatty acids) .Marland Kitchen.. 1 by mouth once daily 6)  Metoprolol Succinate 200 Mg Tb24 (Metoprolol succinate) .... Take one tab daily 7)  Levothyroxine Sodium 25 Mcg Tabs (Levothyroxine sodium) .... Take one tablet daily except 1 & 1/2 weds 8)  Enalapril Maleate 20 Mg Tabs (Enalapril maleate) .... Take one tablet daily 9)  Plavix 75 Mg Tabs (Clopidogrel bisulfate) .Marland Kitchen.. 1 by mouth once daily 10)  Januvia 100 Mg Tabs (Sitagliptin phosphate) .Marland Kitchen.. 1qd 11)  Hydrochlorothiazide 25 Mg Tabs (Hydrochlorothiazide) .Marland Kitchen.. 1 by mouth once daily 12)  Calcium 500 Mg Tabs (Calcium carbonate) .Marland Kitchen.. 1 by mouth once daily 13)  Nitrostat 0.3 Mg Subl (Nitroglycerin) .Marland Kitchen.. 1 under tongue as needed chest pain 14)  Antara 130 Mg Caps (Fenofibrate micronized) .Marland Kitchen.. 1 once daily 15)  Zantac 150 Maximum Strength 150 Mg Tabs (Ranitidine hcl) .Marland Kitchen.. 1 two  times a day pre meals in oplace of omeprazole 16)  Augmentin 875-125 Mg Tabs (Amoxicillin-pot clavulanate) .Marland Kitchen.. 1 by mouth two times a day 17)  Nasonex 50 Mcg/act Susp (Mometasone furoate) .... 2 sprays each nostril once daily  Patient Instructions: 1)  Acute sinusitis symptoms for less than 10 days are not helped by antibiotics. Use warm moist compresses, and over the counter decongestants( only as directed). Call if no improvement in 5-7 days, sooner if increasing pain, fever, or new symptoms.  2)  Nasonex 2 sprays each nostril once daily  3)  Use a Neti pot ( available at drug store) to rinse sinuses 4)  keep appointment  with Dr Alwyn Ren next week  Prescriptions: AUGMENTIN 875-125 MG TABS (AMOXICILLIN-POT CLAVULANATE) 1 by mouth two times a day  #20 x 0   Entered and Authorized by:   Loreen Freud DO   Signed by:   Loreen Freud DO on 07/22/2010   Method used:   Electronically to        [facility] (retail)       [address]       [place], [place]  [address]       Ph: [phone] or [phone]       Fax: [phone]   RxID:   [id number] AUGMENTIN 875-125 MG TABS (AMOXICILLIN-POT CLAVULANATE) 1 by mouth two times a day  #20 x 0   Entered and Authorized by:   Loreen Freud DO   Signed by:   Loreen Freud DO on 07/22/2010   Method used:   Electronically to        [facility] [address] (retail)       [phone]       [place], [place]  [address]       Ph: [phone]       Fax: [phone]   RxID:   [id number]

## 2010-12-23 NOTE — Miscellaneous (Signed)
Summary: [facility]  [facility]   Imported By: Lanelle Bal 09/13/2010 08:45:00  _____________________________________________________________________  External Attachment:    Type:   Image     Comment:   External Document

## 2010-12-23 NOTE — Assessment & Plan Note (Signed)
Summary: discuss patient wanting to drive/alr   Vital Signs:  Patient profile:   77 year old female Weight:      181.4 pounds Pulse rate:   76 / minute Resp:     17 per minute BP sitting:   140 / 78  (left arm) Cuff size:   large  Vitals Entered By: Shonna Chock (April 30, 2010 2:26 PM) CC: Here with daughter to discuss driving status Comments REVIEWED MED LIST, PATIENT AGREED DOSE AND INSTRUCTION CORRECT    Primary Care Provider:  Marga Melnick, MD  CC:  Here with daughter to discuss driving status.  History of Present Illness: [name] , her daughter [name] & her son -in-law are here to discuss Anaise driving. TIA occurred 03/15/2010. The prior visits were reviewed; the event 03/18/2010 was not dropping food  only. Her daughter noted complete weakness in LUE > RUE ; she actually dropped her purse X 2 that day. There wa improvement over 30 minutes but the next day she dropped her mail . That episode lasted 1-2 hrs. Since then she has some "pulling/ tight  sensation" in the LUE  after AC exposure. Office notes & 2D ECHO reviewed with & copy given to [name].  Allergies: 1)  Codeine  Review of Systems ENT:  Complains of difficulty swallowing; denies hoarseness. CV:  Denies chest pain or discomfort and palpitations. GI:  Complains of indigestion; denies abdominal pain, bloody stools, and dark tarry stools; She took PPI 06/07; she was not taking Ranitidine regularly. Neuro:  Complains of numbness, poor balance, and tingling; denies brief paralysis, disturbances in coordination, falling down, headaches, and weakness; Intermittent N&T in feet. Fall walking in hallway in 11/2009; "I stepped on the broom  I was carrying".  Physical Exam  General:  well-nourished,in no acute distress; alert,appropriate and cooperative throughout examination Eyes:  No corneal or conjunctival inflammation noted. EOMI. Perrla. Field of  Vision grossly normal. Lungs:  Normal respiratory effort, chest  expands symmetrically. Lungs are clear to auscultation, no crackles or wheezes. Heart:  Normal rate and regular rhythm. S1 and S2 normal without gallop, murmur, click, rub .Disatnt heart sounds; S4 with slurring Pulses:  R and L carotid,radial,dorsalis pedis and posterior tibial pulses are full and equal bilaterally. Faint carotid bruits Extremities:  1+ left pedal lipedema and 1+ right lipedal edema.   Neurologic:  alert & oriented X3, strength normal in all extremities, sensation intact to light touch, and DTRs symmetrical and normal.  Shuffling gait with forward lean & decreased arm swing. + Tinel's LUE Skin:  Intact without suspicious lesions or rashes Psych:  memory intact for recent and remote, normally interactive, good eye contact, not anxious appearing, and not depressed appearing.     Impression & Recommendations:  Problem # 1:  TRANSIENT ISCHEMIC ATTACKS, HX OF (ICD-V12.50)  Orders: Neurology Referral (Neuro)  Problem # 2:  GERD (ICD-530.81)  Her updated medication list for this problem includes:    Zantac 150 Maximum Strength 150 Mg Tabs (Ranitidine hcl) .Marland Kitchen... 1 two times a day pre meals in oplace of omeprazole. TAKE THIS WITHOUT FAIL two times a day PRE MEALS !  Complete Medication List: 1)  Glimepiride 2 Mg Tabs (Glimepiride) .... Take one tablet  once daily 2)  Metformin Hcl 1000 Mg Tabs (Metformin hcl) .... Take one tablet twice daily 3)  Digoxin 0.125 Mg Tabs (Digoxin) .... Take one daily except 1 and 1/2 tab q wed 4)  Niacin 500 Mg Tbcr (Niacin) .Marland KitchenMarland KitchenMarland Kitchen  Take one tablet daily 5)  Fish Oil 1000 Mg Caps (Omega-3 fatty acids) .Marland Kitchen.. 1 by mouth once daily 6)  Metoprolol Succinate 200 Mg Tb24 (Metoprolol succinate) .... Take one tab daily 7)  Levothyroxine Sodium 25 Mcg Tabs (Levothyroxine sodium) .... Take one tablet daily except 1 & 1/2 weds 8)  Enalapril Maleate 20 Mg Tabs (Enalapril maleate) .... Take one tablet daily 9)  Plavix 75 Mg Tabs (Clopidogrel bisulfate) .Marland Kitchen.. 1 by  mouth once daily 10)  Januvia 100 Mg Tabs (Sitagliptin phosphate) .Marland Kitchen.. 1qd 11)  Hydrochlorothiazide 25 Mg Tabs (Hydrochlorothiazide) .Marland Kitchen.. 1 by mouth once daily 12)  Calcium 500 Mg Tabs (Calcium carbonate) .Marland Kitchen.. 1 by mouth once daily 13)  Nitrostat 0.3 Mg Subl (Nitroglycerin) .Marland Kitchen.. 1 under tongue as needed chest pain 14)  Antara 130 Mg Caps (Fenofibrate micronized) .Marland Kitchen.. 1 once daily 15)  Zantac 150 Maximum Strength 150 Mg Tabs (Ranitidine hcl) .Marland Kitchen.. 1 two times a day pre meals in oplace of omeprazole  Patient Instructions: 1)  Do not take Omeprazole whle on Plavix.Rantidine  150 mg two times a day pre meals.Avoid foods high in acid (tomatoes, citrus juices, spicy foods). Avoid eating within two hours of lying down or before exercising. Do not over eat; try smaller more frequent meals. Elevate head of bed twelve inches when sleeping. Do not drive until cleared by Dr Terrace Arabia.

## 2010-12-23 NOTE — Progress Notes (Signed)
Summary: refill 90 day supply  Phone Note Refill Request Message from:  Patient on August 12, 2010 9:11 AM  Refills Requested: Medication #1:  LEVOTHYROXINE SODIUM 25 MCG  TABS take one tablet daily except 1 & 1/2 Weds  Medication #2:  ZANTAC 150 MAXIMUM STRENGTH 150 MG TABS 1 two times a day pre meals in oplace of Omeprazole patient wants 90 day supply - mail order  -  [facility]  Initial call taken by: Okey Regal Spring,  August 12, 2010 9:14 AM    Prescriptions: ZANTAC 150 MAXIMUM STRENGTH 150 MG TABS (RANITIDINE HCL) 1 two times a day pre meals in oplace of Omeprazole  #180 x 1   Entered by:   Shonna Chock CMA   Authorized by:   Marga Melnick MD   Signed by:   Shonna Chock CMA on 08/12/2010   Method used:   Faxed to ...       [facility] (mail[facility]order)       [address] [place], [place]  [address]       Ph: [phone]       Fax: [phone]   RxID:   [id number] LEVOTHYROXINE SODIUM 25 MCG  TABS (LEVOTHYROXINE SODIUM) take one tablet daily except 1 & 1/2 Weds  #90 Each x 1   Entered by:   Shonna Chock CMA   Authorized by:   Marga Melnick MD   Signed by:   Shonna Chock CMA on 08/12/2010   Method used:   Faxed to ...       [facility]       [address] [place], [place]  [address]       Ph: [phone]       Fax: [phone]   RxID:   [id number]

## 2010-12-23 NOTE — Assessment & Plan Note (Signed)
Summary: 3 month roa//lch   Vital Signs:  Patient profile:   77 year old female Weight:      183.6 pounds Pulse rate:   80 / minute Resp:     17 per minute BP sitting:   120 / 78  (left arm) Cuff size:   large  Vitals Entered By: Shonna Chock (Apr 14, 2010 3:18 PM) CC: 3 Month follow-up, discuss labs (copy given) Comments **Patient was rx'ed Plavix and has come to the conclusion after today she will no longer take cause it hurts her to swallow since taking med.**   Primary Care Provider:  Marga Melnick, MD  CC:  3 Month follow-up and discuss labs (copy given).  History of Present Illness: Labs reviewed & risks discussed. Role of High Fructose Corn Syrup  in raising TG reviewed. TG 464 & LDL 105 on Simvastain 40 mg at bedtime  M,W, F & Sun and Antara 130 mg T, Th & Sat. No specific diet ; no CVE . Dr Terrace Arabia Rxed Plavix ; the package insert instructed her not to take  Omeprazole. Dr Charlies Constable had Rxed Diclofennac 12/2008 according to her history but this is not in that OV note. His 12/2009 note has her on Diclofenac 50 mg qd.. Dyspepsia since Plavix started. Risks of NSAIDS , PPI & Plavix discussed.  Allergies: 1)  Codeine  Review of Systems ENT:  Complains of difficulty swallowing; denies hoarseness. GI:  Complains of indigestion; denies abdominal pain, bloody stools, and dark tarry stools.  Physical Exam  General:  in no acute distress; alert,appropriate and cooperative throughout examination Eyes:  No corneal or conjunctival inflammation noted. No icterus Mouth:  Oral mucosa and oropharynx without lesions or exudates.  Teeth in good repair. no pharyngeal erythema.   Lungs:  Normal respiratory effort, chest expands symmetrically. Lungs are clear to auscultation, no crackles or wheezes. Heart:  Normal rate and regular rhythm. S1 and S2 normal without gallop, murmur, click, rub . S4 with sluring Abdomen:  Bowel sounds positive,abdomen soft and non-tender without masses,  organomegaly or hernias noted. Pulses:  R and L carotid,radial,dorsalis pedis and posterior tibial pulses are full and equal bilaterally Extremities:  No clubbing, cyanosis. Lipedema   Impression & Recommendations:  Problem # 1:  HYPERLIPIDEMIA-MIXED (ICD-272.4)  The following medications were removed from the medication list:    Simvastatin 40 Mg Tabs (Simvastatin) .Marland Kitchen... Take one tab daily Her updated medication list for this problem includes:    Niacin 500 Mg Tbcr (Niacin) .Marland Kitchen... Take one tablet daily    Antara 130 Mg Caps (Fenofibrate micronized) .Marland Kitchen... 1 once daily.Note: statin will be held with concentration on getting TG < 300. If LDL rises significantly, Crestor & Trilipx combo will be considered.  Problem # 2:  DYSPEPSIA (ICD-536.8) In context of combimed Plavix, NSAID, PPI & low dose ASA therapy. See med adjustments.  Complete Medication List: 1)  Glimepiride 2 Mg Tabs (Glimepiride) .... Take one tablet  once daily 2)  Metformin Hcl 1000 Mg Tabs (Metformin hcl) .... Take one tablet twice daily 3)  Digoxin 0.125 Mg Tabs (Digoxin) .... Take one daily except 1 and 1/2 tab q wed 4)  Niacin 500 Mg Tbcr (Niacin) .... Take one tablet daily 5)  Fish Oil 1000 Mg Caps (Omega-3 fatty acids) .Marland Kitchen.. 1 by mouth once daily 6)  Metoprolol Succinate 200 Mg Tb24 (Metoprolol succinate) .... Take one tab daily 7)  Levothyroxine Sodium 25 Mcg Tabs (Levothyroxine sodium) .... Take one tablet daily  except 1 & 1/2 weds 8)  Enalapril Maleate 20 Mg Tabs (Enalapril maleate) .... Take one tablet daily 9)  Adult Aspirin Ec Low Strength 81 Mg Tbec (Aspirin) 10)  Januvia 100 Mg Tabs (Sitagliptin phosphate) .Marland Kitchen.. 1qd 11)  Hydrochlorothiazide 25 Mg Tabs (Hydrochlorothiazide) .Marland Kitchen.. 1 by mouth once daily 12)  Calcium 500 Mg Tabs (Calcium carbonate) .Marland Kitchen.. 1 by mouth once daily 13)  Nitrostat 0.3 Mg Subl (Nitroglycerin) .Marland Kitchen.. 1 under tongue as needed chest pain 14)  Antara 130 Mg Caps (Fenofibrate micronized) .Marland Kitchen.. 1  once daily 15)  Zantac 150 Maximum Strength 150 Mg Tabs (Ranitidine hcl) .Marland Kitchen.. 1 two times a day pre meals in oplace of omeprazole  Patient Instructions: 1)  Avoid Diclofenac & Omeprazole due to GI risks with Plavix. Stop Simvastatin . Take Ranitidine 150 mg before b'fast & eve meal. 2)  Please schedule a follow-up appointment in 3 months.Hepatic Panel prior to visit, ICD-9:995.20;Lipid Panel prior to visit, ICD-9:272.2;HbgA1C prior to visit, ICD-9:250.00; CBC, ICD -9: 530.81.Marland Kitchen Consume LESS THAN 40 grams of High Fructose Corn Syrup "sugar"/ day as discussed.Avoid foods high in acid (tomatoes, citrus juices, spicy foods). Avoid eating within two hours of lying down or before exercising. Do not over eat; try smaller more frequent meals. Elevate head of bed twelve inches when sleeping. Prescriptions: ZANTAC 150 MAXIMUM STRENGTH 150 MG TABS (RANITIDINE HCL) 1 two times a day pre meals in oplace of Omeprazole  #60 x 2   Entered and Authorized by:   Marga Melnick MD   Signed by:   Marga Melnick MD on 04/14/2010   Method used:   Print then Give to Patient   RxID:   [id number] ANTARA 130 MG CAPS (FENOFIBRATE MICRONIZED) 1 once daily  #30 x 2   Entered and Authorized by:   Marga Melnick MD   Signed by:   Marga Melnick MD on 04/14/2010   Method used:   Print then Give to Patient   RxID:   [id number]

## 2010-12-23 NOTE — Progress Notes (Signed)
Summary: schedule nct/emc per ov [id number]  Phone Note Call from Patient Call back at Home Phone [phone]   Caller: Patient Summary of Call: patient was given a splint & had labs - she said the splint isnt helping - per note on ov [id number] dr hopper notes nct/emc if not better - patient would like afternoon appt  Initial call taken by: Okey Regal Spring,  Mar 24, 2010 10:10 AM  Follow-up for Phone Call        REFERRAL ENTERED, WILL SCH'D APPT & INFORM PATIENT. Follow-up by: Magdalen Spatz [facility],  Mar 24, 2010 10:25 AM  Additional Follow-up for Phone Call Additional follow up Details #1::        thanks, Hopp Additional Follow-up by: Marga Melnick MD,  Mar 24, 2010 1:04 PM

## 2010-12-23 NOTE — Cardiovascular Report (Signed)
Summary: TTM   TTM   Imported By: Roderic Ovens 06/06/2010 15:48:38  _____________________________________________________________________  External Attachment:    Type:   Image     Comment:   External Document

## 2010-12-23 NOTE — Cardiovascular Report (Signed)
Summary: TTM   TTM   Imported By: Roderic Ovens 12/31/2009 16:00:58  _____________________________________________________________________  External Attachment:    Type:   Image     Comment:   External Document

## 2010-12-23 NOTE — Progress Notes (Signed)
Summary: re; driving  Phone Note Call from Patient   Caller: Daughter [name] Summary of Call: Daughter called left msg needs some help from doctor; mother wants to drive again and has had a TIA and still having some residual from that, daughter very anxious about it. OV scheduled to discuss .Kandice Hams  April 29, 2010 3:51 PM   Initial call taken by: Kandice Hams,  April 29, 2010 3:51 PM

## 2010-12-23 NOTE — Progress Notes (Signed)
Summary:  OPINION ON TAKING HER BABY ASPIRIN/left msg to call  Phone Note Call from Patient Call back at Home Phone [phone]   Caller: Patient Summary of Call: 1)  PATIENT SAID SHE WAS CONCERNED THAT DR YEN (NEUROLOGIST) HAS TAKEN HER OFF THE BABY ASPIRIN AND TOLD HER TO TAKE PLAVIX IN ITS PLACE---WANTS TO KNOW IF DR HOPPER IS OK WITH THIS   2)   FYI----SHE IS SCHEDULED FOR LAB WORK TOMORROW ON TUESDAY 5/17 AND APPOINTMENT WITH DR HOPPER ON MON 5/23, BUT WOULD LIKE PHONE CALL BEFORE HER APPOINTMENT WITH DR HOPPER  3)   FYI----SAID SHE WILL BE HAVING A CAT SCAN AND A DOPPLER OF HER NECK BEFORE HER APPOINTMENT WITH DR Debarah Crape = 6/28 Initial call taken by: Jerolyn Shin,  Apr 07, 2010 11:24 AM  Follow-up for Phone Call        the Plavix should provide greater protection than the small dose ASA Follow-up by: Marga Melnick MD,  Apr 08, 2010 6:02 AM  Additional Follow-up for Phone Call Additional follow up Details #1::        left msg for pt to call re; Dr Caryl Never recommendations .Kandice Hams  Apr 08, 2010 8:33 AM pt was in office today for labwork, Dr Caryl Never recommendations given .Kandice Hams  Apr 08, 2010 8:51 AM

## 2010-12-23 NOTE — Progress Notes (Signed)
Summary: Refill--Januvia  Phone Note Refill Request Message from:  Pharmacy on October 28, 2010 8:49 AM  Refills Requested: Medication #1:  JANUVIA 100 MG  TABS 1qd [facility]  Initial call taken by: Lucious Groves CMA,  October 28, 2010 8:49 AM    Prescriptions: JANUVIA 100 MG  TABS (SITAGLIPTIN PHOSPHATE) 1qd  #90 x 0   Entered by:   Lucious Groves CMA   Authorized by:   Marga Melnick MD   Signed by:   Lucious Groves CMA on 10/28/2010   Method used:   Faxed to ...       [facility] (mail[facility])       [address] [place], [place]  [address]       Ph: [phone]       Fax: [phone]   RxID:   [id number]

## 2010-12-23 NOTE — Assessment & Plan Note (Signed)
Summary: problem with hands//lch   Vital Signs:  Patient profile:   77 year old female Weight:      186.2 pounds Temp:     98.6 degrees F oral Pulse rate:   76 / minute Resp:     17 per minute BP sitting:   136 / 78  (left arm) Cuff size:   large  Vitals Entered By: Shonna Chock (March 19, 2010 12:27 PM) CC: Problem with hand giving out since Sunday Comments REVIEWED MED LIST, PATIENT AGREED DOSE AND INSTRUCTION CORRECT    Primary Care Provider:  Marga Melnick, MD  CC:  Problem with hand giving out since Sunday.  History of Present Illness: Soreness & cold  in feet overnight   03/16/2010; better with heating pad. As of 04/26  she noted weakness in hands; she actually dropped some food on blouse.On thyroid replacement;no PMH of CTS  Allergies: 1)  Codeine  Review of Systems Derm:  Denies lesion(s) and rash. Neuro:  Complains of numbness and tingling; denies brief paralysis; Some N&T in hands.  Physical Exam  General:  in no acute distress; alert,appropriate and cooperative throughout examination Pulses:  R and L radial pulses are full and equal bilaterally Extremities:  No clubbing, cyanosis, edema.Mild OA hand changes Neurologic:  alert & oriented X3 and strengthessentially normal in upper  extremities.  + Tinel's on L Skin:  Intact without suspicious lesions or rashes Axillary Nodes:  No palpable lymphadenopathy   Impression & Recommendations:  Problem # 1:  WEAKNESS (ICD-780.79)  in hands x 2 days, R/O CTS  Orders: Venipuncture ([address]) TLB-TSH (Thyroid Stimulating Hormone) (84443-TSH) Durable Medical Equipment (DME)  Problem # 2:  HYPOTHYROIDISM NOS (ICD-244.9)  Her updated medication list for this problem includes:    Levothyroxine Sodium 25 Mcg Tabs (Levothyroxine sodium) .Marland Kitchen... Take one tablet daily except 1 & 1/2 weds  Orders: Venipuncture ([address]) TLB-TSH (Thyroid Stimulating Hormone) (84443-TSH) Durable Medical Equipment (DME)  Complete Medication  List: 1)  Glimepiride 2 Mg Tabs (Glimepiride) .... Take one tablet  once daily 2)  Omeprazole 20 Mg Cpdr (Omeprazole) .Marland Kitchen.. 1 by mouth once daily 3)  Metformin Hcl 1000 Mg Tabs (Metformin hcl) .... Take one tablet twice daily 4)  Digoxin 0.125 Mg Tabs (Digoxin) .... Take one daily except 1 and 1/2 tab q wed 5)  Simvastatin 40 Mg Tabs (Simvastatin) .... Take one tab daily 6)  Diclofenac Sodium 50 Mg Tbec (Diclofenac sodium) .Marland Kitchen.. 1 by mouth once daily 7)  Niacin 500 Mg Tbcr (Niacin) .... Take one tablet daily 8)  Fish Oil 1000 Mg Caps (Omega-3 fatty acids) .Marland Kitchen.. 1 by mouth once daily 9)  Metoprolol Succinate 200 Mg Tb24 (Metoprolol succinate) .... Take one tab daily 10)  Levothyroxine Sodium 25 Mcg Tabs (Levothyroxine sodium) .... Take one tablet daily except 1 & 1/2 weds 11)  Enalapril Maleate 20 Mg Tabs (Enalapril maleate) .... Take one tablet daily 12)  Adult Aspirin Ec Low Strength 81 Mg Tbec (Aspirin) 13)  Januvia 100 Mg Tabs (Sitagliptin phosphate) .Marland Kitchen.. 1qd 14)  Hydrochlorothiazide 25 Mg Tabs (Hydrochlorothiazide) .Marland Kitchen.. 1 by mouth once daily 15)  Calcium 500 Mg Tabs (Calcium carbonate) .Marland Kitchen.. 1 by mouth once daily 16)  Nitrostat 0.3 Mg Subl (Nitroglycerin) .Marland Kitchen.. 1 under tongue as needed chest pain  Patient Instructions: 1)  Wear L wrist splint  @ night as a trial. NCT/EMG if no better  Appended Document: problem with hands//lch Medications Added * LEFT WRIST SPLINT DX: 780.79 (Weakness)  Prescriptions: LEFT WRIST SPLINT DX: 780.79 (Weakness)  #1 x 0   Entered by:   Shonna Chock   Authorized by:   Marga Melnick MD   Signed by:   Shonna Chock on 03/19/2010   Method used:   Print then Give to Patient   RxID:   [id number]

## 2010-12-23 NOTE — Letter (Signed)
Summary: CMN for Diabetes Supplies/[facility]  CMN for Diabetes Supplies/[facility]   Imported By: Lanelle Bal 08/29/2010 12:28:38  _____________________________________________________________________  External Attachment:    Type:   Image     Comment:   External Document

## 2010-12-23 NOTE — Progress Notes (Signed)
Summary: Refill Request  Phone Note Refill Request Call back at [phone] Message from:  Pharmacy on April 24, 2010 10:06 AM  Refills Requested: Medication #1:  JANUVIA 100 MG  TABS 1qd   Dosage confirmed as above?Dosage Confirmed   Supply Requested: 3 months [facility]  Next Appointment Scheduled: 8.30.11 Initial call taken by: Harold Barban,  April 24, 2010 10:06 AM    Prescriptions: JANUVIA 100 MG  TABS (SITAGLIPTIN PHOSPHATE) 1qd  #90 x 1   Entered by:   Shonna Chock   Authorized by:   Marga Melnick MD   Signed by:   Shonna Chock on 04/24/2010   Method used:   Faxed to ...       [facility] ([facility]       [address] [place], [place]  [address]       Ph: [phone]       Fax: [phone]   RxID:   [id number]

## 2010-12-23 NOTE — Procedures (Signed)
Summary: Colonoscopy and biopsy   Colonoscopy  Procedure date:  08/06/2005  Findings:      Results: Diverticulosis.       Pathology:  Adenomatous polyp.        Location:  [facility].    Procedures Next Due Date:    Colonoscopy: 07/2008 Patient Name: [name], [name] MRN:  Procedure Procedures: Colonoscopy CPT: [id number].    with biopsy. CPT: Q5068410.    with polypectomy. CPT: A3573898.  Personnel: Endoscopist: Venita Lick. Russella Dar, MD, Clementeen Graham.  Exam Location: Exam performed in Outpatient Clinic. Outpatient  Patient Consent: Procedure, Alternatives, Risks and Benefits discussed, consent obtained, from patient. Consent was obtained by the RN.  Indications  Surveillance of: Adenomatous Polyp(s). Initial polypectomy was performed in 2003. in Sep.  Increased Risk Screening: For family history of colorectal neoplasia, in  parent age at onset: 46. sibling age at onset: 79.  Comments: Mother and brother with colon cancer. History  Current Medications: Patient is not currently taking Coumadin.  Pre-Exam Physical: Performed Aug 06, 2005. Cardio-pulmonary exam, Rectal exam, HEENT exam , Abdominal exam, Mental status exam WNL.  Exam Exam: Extent of exam reached: Cecum, extent intended: Cecum.  The cecum was identified by appendiceal orifice and IC valve. Colon retroflexion performed. ASA Classification: II. Tolerance: excellent.  Monitoring: Pulse and BP monitoring, Oximetry used. Supplemental O2 given.  Colon Prep Used Glycolax for colon prep. Prep results: good.  Sedation Meds: Patient assessed and found to be appropriate for moderate (conscious) sedation. Fentanyl 50 mcg. given IV. Versed 3 mg. given IV.  Findings NORMAL EXAM: Hepatic Flexure.  NORMAL EXAM: Splenic Flexure.  - DIVERTICULOSIS: Descending Colon to Sigmoid Colon. Not bleeding. ICD9: Diverticulosis: 562.10.  POLYP: Ascending Colon, Maximum size: 4 mm. sessile polyp. Procedure:  snare without cautery,  removed, retrieved, Polyp sent to pathology. ICD9: Colon Polyps: 211.3.  POLYP: Transverse Colon, Maximum size: 2 mm. sessile polyp. Procedure:  biopsy without cautery, removed, retrieved, sent to pathology. ICD9: Colon Polyps: 211.3.  NORMAL EXAM: Cecum.  NORMAL EXAM: Rectum.   Assessment  Diagnoses: 211.3: Colon Polyps.  562.10: Diverticulosis.   Events  Unplanned Interventions: No intervention was required.  Unplanned Events: There were no complications. Plans  Post Exam Instructions: Post sedation instructions given. No aspirin or non-steroidal containing medications: 2 weeks.  Medication Plan: Await pathology.  Patient Education: Patient given standard instructions for: Polyps. Diverticulosis.  Disposition: After procedure patient sent to recovery. After recovery patient sent home.  Scheduling/Referral: Colonoscopy, to [facility] T. Russella Dar, MD, [facility], FHx & h/o adenomas, around Aug 06, 2008.    This report was created from the original endoscopy report, which was reviewed and signed by the above listed endoscopist.    cc: Titus Dubin. Alwyn Ren, MD           SP Surgical Pathology - STATUS: Final             By: Morrie Sheldon,       Perform Date: 14Sep06 00:00  Ordered By: Rica Records Date: 14Sep06 17:09  Facility: [facility]                               Department: CPATH  Service Report Text  [facility]   [address] [address]   [place], [place] [address]   Telephone [phone] or [phone] Fax [phone]  REPORT OF SURGICAL PATHOLOGY    Case #: [id number]   Patient Name: [name], [name]   PID: [id number]   Pathologist: Beulah Gandy. Luisa Hart, MD   DOB/Age 12/26/1924 (Age: 77) Gender: F   Date Taken: 08/06/2005   Date Received: 08/06/2005    FINAL DIAGNOSIS    ***MICROSCOPIC EXAMINATION AND DIAGNOSIS***    COLON, ASCENDING AND TRANSVERSE POLYPS: TUBULAR ADENOMAS. NO   HIGH GRADE DYSPLASIA OR  MALIGNANCY IDENTIFIED.    gdt   Date Reported: 08/07/2005 Beulah Gandy. Luisa Hart, MD   *** Electronically Signed Out By JDP ***    Clinical information   HX adenomatous polyps; R/O adenoma (caf)    specimen(s) obtained   Colon, polyp(s), ascending, transverse    Gross Description   Received in formalin are tan, soft tissue fragments that are   submitted in toto. Number: 3   Size: 0.3 to 0.5 cm, 1 block submitted.   NOTE: There was only one bottle received for this case. (lc:tw   08/06/05)    tw/

## 2010-12-23 NOTE — Miscellaneous (Signed)
Summary: dx code correction   Clinical Lists Changes  Problems: Changed problem from PACEMAKER (ICD-V45.Marland Kitchen01) to PACEMAKER, PERMANENT (ICD-V45.01) changed the incorrect dx code to correct dx code Genella Mech  September 19, 2010 10:31 AM

## 2010-12-23 NOTE — Assessment & Plan Note (Signed)
Summary: Gastroenterology  [name]  MR#:  [id number] Page #  [name]   GASTROENTEROLOGY OFFICE NOTE  NAME:  [name], [name]   OFFICE NO:  [id number]  DATE:  01/24/03  DOB:  September 11, 2025  REFERRING PHYSICIAN:  Dr. Marga Melnick.  HISTORY OF PRESENT ILLNESS:  The patient has had worsening problems with chest pressure, bloating, and occasional dysphagia.  All of her symptoms appear to be related to meals.  Occasionally, she has a pressure sensation around her jaw and teeth.  These symptoms are not exertional, they have not been associated with shortness of breath or diaphoresis.  She was evaluated by Dr. Alwyn Ren for these complaints several weeks ago, and she is now referred here for further evaluation.  She is maintained on Prilosec 20 mg q.d.  Last endoscopy was performed in May of 1999 with dilation of a peptic stricture.  She did have erosive esophagitis and a sliding hiatal hernia noted.    CURRENT MEDICATIONS:  Listed on the chart, updated and reviewed.  ALLERGIES:  Codeine, leading to a rash.  No latex allergy.  PHYSICAL EXAMINATION:  No acute distress.  Vital signs:  Weight 190 pounds, blood pressure is 140/82, pulse 84 and regular.  HEENT exam:  Anicteric sclerae; oropharynx is clear.  Chest:  Clear to auscultation and percussion, no chest wall tenderness appreciated.  Cardiac:  Regular rate and rhythm without murmurs.  The abdomen is soft and nontender with normal, active bowel sounds, no palpable organomegaly, masses, or hernias.  Neurologic:  Alert and oriented x 3.  Grossly nonfocal.  ASSESSMENT AND PLAN:  Presumed gastroesophageal reflux disease.  Rule out peptic stricture.  Increase Prilosec to 20 mg p.o. b.i.d. taken a half-hour before breakfast and dinner.  After that is completed, change to Protonix 40 mg q.a.m.  Intensify all antireflux measures.  Plan for return office visit in six weeks.  If all the patient's symptoms have not come under good control, and in particular, if her  dysphagia persists, we will need to proceed with upper endoscopy.  If her chest and jaw symptoms  do not rapidly abate on increased acid suppression, she is to return to Dr. Alwyn Ren for further followup and evaluation.    Venita Lick. Russella Dar, M.D., F.A.C.G.  [id number] cc:  Dr. Marga Melnick D:  01/24/03; T:  ; Job [id number]

## 2010-12-23 NOTE — Procedures (Signed)
Summary: [facility]   Imported By: Sherian Rein 02/13/2010 15:06:30  _____________________________________________________________________  External Attachment:    Type:   Image     Comment:   External Document

## 2010-12-23 NOTE — Progress Notes (Signed)
Summary: Refill Request from Patient  Phone Note Refill Request Message from:  Patient on December 27, 2009 1:30 PM  Refills Requested: Medication #1:  JANUVIA 100 MG  TABS 1qd   Dosage confirmed as above?Dosage Confirmed Patient needs to have prescription printed out so she can mail it out. Would like to pick up on monday.  Initial call taken by: Harold Barban,  December 27, 2009 1:30 PM  Follow-up for Phone Call        RX placed at the front desk for pick-up Follow-up by: Shonna Chock,  December 27, 2009 1:41 PM    Prescriptions: JANUVIA 100 MG  TABS (SITAGLIPTIN PHOSPHATE) 1qd  #90 x 0   Entered by:   Shonna Chock   Authorized by:   Marga Melnick MD   Signed by:   Shonna Chock on 12/27/2009   Method used:   Print then Give to Patient   RxID:   [id number]

## 2010-12-23 NOTE — Progress Notes (Signed)
Summary: Note Dropped off by patient  Phone Note Refill Request Message from:  Patient  Refills Requested: Medication #1:  ANTARA 130 MG CAPS 1 once daily Patient was given a script for this but didnt get it filled cause for a 30 day supply from [facility] 127.00, [facility] will charge 131.00 for 67-months supply   Method Requested: Fax to Mail Away Pharmacy    Prescriptions: ANTARA 130 MG CAPS (FENOFIBRATE MICRONIZED) 1 once daily  #90 x 0   Entered by:   Shonna Chock   Authorized by:   Marga Melnick MD   Signed by:   Shonna Chock on 04/25/2010   Method used:   Faxed to ...       [facility] ([facility])       [address] [place], [place]  [address]       Ph: [phone]       Fax: [phone]   RxID:   [id number]

## 2010-12-23 NOTE — Miscellaneous (Signed)
Summary: Orders Update  Clinical Lists Changes  Orders: Added new Referral order of Misc. Referral (Misc. Ref) - Signed 

## 2010-12-23 NOTE — Letter (Signed)
[facility]   Imported By: Lanelle Bal 01/11/2010 10:10:43  _____________________________________________________________________  External Attachment:    Type:   Image     Comment:   External Document

## 2010-12-23 NOTE — Assessment & Plan Note (Signed)
Summary: rto 3 months.cbs   Vital Signs:  Patient profile:   77 year old female Weight:      179.6 pounds BMI:     29.09 Pulse rate:   76 / minute Resp:     19 per minute BP sitting:   118 / 60  (left arm)  Vitals Entered By: Shonna Chock CMA (July 24, 2010 10:29 AM) CC: Follow-up on labs (patient with mailed copy)   Primary Care Provider:  Marga Melnick, MD  CC:  Follow-up on labs (patient with mailed copy).  History of Present Illness: Sinus symptoms improved with meds. Labs reviewed & risks discussed.  Old labs reviewed; TG improved  but not @ goal. Anemia stable to improved vs 11/2009. B12 level & iron levels were low normal then. Negative  ROS for blood loss or bleeding  dyscrasias. CCU urinalysis reviewed : leuks & large blood. S/P sulfa agent with resolution, ie no  active GU symptoms. Hyperlipidemia Follow-Up      This is an 77 year old woman who presents for Hyperlipidemia follow-up ( see comments concerning TG above).  The patient denies muscle aches, GI upset, abdominal pain, flushing, itching, constipation, and diarrhea.  The patient denies the following symptoms: chest pain/pressure, exercise intolerance, dypsnea, palpitations, syncope, and pedal edema.  Compliance with medications (by patient report) has been near 100% with Antara.   Role of HFCS sugar in raising TG discussed.  Current Medications (verified): 1)  Glimepiride 2 Mg Tabs (Glimepiride) .... Take One Tablet  Once Daily 2)  Metformin Hcl 1000 Mg  Tabs (Metformin Hcl) .... Take One Tablet Twice Daily 3)  Digoxin 0.125 Mg  Tabs (Digoxin) .... Take One Daily Except 1 and 1/2 Tab Q Wed 4)  Niacin 500 Mg  Tbcr (Niacin) .... Take One Tablet Daily 5)  Fish Oil 1000 Mg Caps (Omega-3 Fatty Acids) .Marland Kitchen.. 1 By Mouth Once Daily 6)  Metoprolol Succinate 200 Mg  Tb24 (Metoprolol Succinate) .... Take One Tab Daily 7)  Levothyroxine Sodium 25 Mcg  Tabs (Levothyroxine Sodium) .... Take One Tablet Daily Except 1 & 1/2  Weds 8)  Enalapril Maleate 20 Mg  Tabs (Enalapril Maleate) .... Take One Tablet Daily 9)  Plavix 75 Mg Tabs (Clopidogrel Bisulfate) .Marland Kitchen.. 1 By Mouth Once Daily 10)  Januvia 100 Mg  Tabs (Sitagliptin Phosphate) .Marland Kitchen.. 1qd 11)  Hydrochlorothiazide 25 Mg  Tabs (Hydrochlorothiazide) .Marland Kitchen.. 1 By Mouth Once Daily 12)  Calcium 500 Mg Tabs (Calcium Carbonate) .Marland Kitchen.. 1 By Mouth Once Daily 13)  Nitrostat 0.3 Mg Subl (Nitroglycerin) .Marland Kitchen.. 1 Under Tongue As Needed Chest Pain 14)  Antara 130 Mg Caps (Fenofibrate Micronized) .Marland Kitchen.. 1 Once Daily 15)  Zantac 150 Maximum Strength 150 Mg Tabs (Ranitidine Hcl) .Marland Kitchen.. 1 Two Times A Day Pre Meals in Oplace of Omeprazole 16)  Augmentin 875-125 Mg Tabs (Amoxicillin-Pot Clavulanate) .Marland Kitchen.. 1 By Mouth Two Times A Day 17)  Nasonex 50 Mcg/act Susp (Mometasone Furoate) .... 2 Sprays Each Nostril Once Daily  Allergies: 1)  Codeine  Review of Systems General:  WEight down 20+ # with modified W W s diet.. ENT:  Denies nosebleeds. Resp:  Denies coughing up blood. GI:  Denies bloody stools and dark tarry stools. GU:  Denies discharge, dysuria, and hematuria; Recent UTI treated @ UC. Heme:  Denies abnormal bruising and bleeding.  Physical Exam  General:  well-nourished,in no acute distress; alert,appropriate and cooperative throughout examination Lungs:  Normal respiratory effort, chest expands symmetrically. Lungs are clear to auscultation,  no crackles or wheezes. Heart:  Normal rate and regular rhythm. S1 and S2 normal without gallop, murmur, click, rub. S4 Pulses:  R and L dorsalis pedis and posterior tibial pulses are  decreased due to lipedema Skin:  Intact without suspicious lesions or rashes Psych:  memory intact for recent and remote, normally interactive, and good eye contact.     Impression & Recommendations:  Problem # 1:  HYPERLIPIDEMIA-MIXED (ICD-272.4)  The following medications were removed from the medication list:    Antara 130 Mg Caps (Fenofibrate  micronized) .Marland Kitchen... 1 once daily Her updated medication list for this problem includes:    Niacin 500 Mg Tbcr (Niacin) .Marland Kitchen... Take one tablet daily    Trilipix 135 Mg Cpdr (Choline fenofibrate) .Marland Kitchen... 1 once daily  Orders: Specimen Handling (64403) UA Dipstick w/o Micro (manual) (81002)  Problem # 2:  ANEMIA, MILD (ICD-285.9)  Orders: Venipuncture (47425) TLB-IBC Pnl (Iron/FE;Transferrin) (83550-IBC) TLB-B12, Serum-Total ONLY (95638-V56) T- * Misc. Laboratory test [id number]) Specimen Handling (51884) UA Dipstick w/o Micro (manual) (81002)  Problem # 3:  HEMATURIA (ICD-599.70)  Her updated medication list for this problem includes:    Augmentin 875-125 Mg Tabs (Amoxicillin-pot clavulanate) .Marland Kitchen... 1 by mouth two times a day  Orders: Specimen Handling ([address])  Complete Medication List: 1)  Glimepiride 2 Mg Tabs (Glimepiride) .... Take one tablet  once daily 2)  Metformin Hcl 1000 Mg Tabs (Metformin hcl) .... Take one tablet twice daily 3)  Digoxin 0.125 Mg Tabs (Digoxin) .... Take one daily except 1 and 1/2 tab q wed 4)  Niacin 500 Mg Tbcr (Niacin) .... Take one tablet daily 5)  Fish Oil 1000 Mg Caps (Omega-3 fatty acids) .Marland Kitchen.. 1 by mouth once daily 6)  Metoprolol Succinate 200 Mg Tb24 (Metoprolol succinate) .... Take one tab daily 7)  Levothyroxine Sodium 25 Mcg Tabs (Levothyroxine sodium) .... Take one tablet daily except 1 & 1/2 weds 8)  Enalapril Maleate 20 Mg Tabs (Enalapril maleate) .... Take one tablet daily 9)  Plavix 75 Mg Tabs (Clopidogrel bisulfate) .Marland Kitchen.. 1 by mouth once daily 10)  Januvia 100 Mg Tabs (Sitagliptin phosphate) .Marland Kitchen.. 1qd 11)  Hydrochlorothiazide 25 Mg Tabs (Hydrochlorothiazide) .Marland Kitchen.. 1 by mouth once daily 12)  Calcium 500 Mg Tabs (Calcium carbonate) .Marland Kitchen.. 1 by mouth once daily 13)  Nitrostat 0.3 Mg Subl (Nitroglycerin) .Marland Kitchen.. 1 under tongue as needed chest pain 14)  Zantac 150 Maximum Strength 150 Mg Tabs (Ranitidine hcl) .Marland Kitchen.. 1 two times a day pre meals in oplace of  omeprazole 15)  Augmentin 875-125 Mg Tabs (Amoxicillin-pot clavulanate) .Marland Kitchen.. 1 by mouth two times a day 16)  Nasonex 50 Mcg/act Susp (Mometasone furoate) .... 2 sprays each nostril once daily 17)  Trilipix 135 Mg Cpdr (Choline fenofibrate) .Marland Kitchen.. 1 once daily  Patient Instructions: 1)  Consume < 30 grams of High Fructose Corn Syrup sugar / day . 2)  Please schedule a follow-up appointment in 4 months. 3)  Lipid Panel prior to visit, ICD-9:272.4 4)  HbgA1C prior to visit, ICD-9:250.00 Prescriptions: TRILIPIX 135 MG CPDR (CHOLINE FENOFIBRATE) 1 once daily  #30 x 5   Entered and Authorized by:   Marga Melnick MD   Signed by:   Marga Melnick MD on 07/24/2010   Method used:   Print then Give to Patient   RxID:   [id number]   Laboratory Results   Urine Tests   Date/Time Reported: July 24, 2010 12:44 PM   Routine Urinalysis   Color: yellow Appearance: Clear Glucose:  negative   (Normal Range: Negative) Bilirubin: negative   (Normal Range: Negative) Ketone: negative   (Normal Range: Negative) Spec. Gravity: <1.005   (Normal Range: 1.003-1.035) Blood: negative   (Normal Range: Negative) pH: 7.5   (Normal Range: 5.0-8.0) Protein: negative   (Normal Range: Negative) Urobilinogen: negative   (Normal Range: 0-1) Nitrite: negative   (Normal Range: Negative) Leukocyte Esterace: negative   (Normal Range: Negative)    Comments: Floydene Flock  July 24, 2010 12:44 PM

## 2010-12-23 NOTE — Letter (Signed)
Summary: New Patient letter  [facility]  [address] [place], [place] [address]   Phone: [phone]  Fax: [phone]       01/14/2010 MRN: [id number]  [facility] [address] [place], [place]  [address]  Dear Ms. Bents,  Welcome to the Gastroenterology Division at [facility].    You are scheduled to see Dr. Russella Dar on 02-13-10 at 10:00a.m. on the 3rd floor at [facility], [address]  We ask that you try to arrive at our office 15 minutes prior to your appointment time to allow for check-in.  We would like you to complete the enclosed self-administered evaluation form prior to your visit and bring it with you on the day of your appointment.  We will review it with you.  Also, please bring a complete list of all your medications or, if you prefer, bring the medication bottles and we will list them.  Please bring your insurance card so that we may make a copy of it.  If your insurance requires a referral to see a specialist, please bring your referral form from your primary care physician.  Co-payments are due at the time of your visit and may be paid by cash, check or credit card.     Your office visit will consist of a consult with your physician (includes a physical exam), any laboratory testing he/she may order, scheduling of any necessary diagnostic testing (e.g. x-ray, ultrasound, CT-scan), and scheduling of a procedure (e.g. Endoscopy, Colonoscopy) if required.  Please allow enough time on your schedule to allow for any/all of these possibilities.    If you cannot keep your appointment, please call [phone] to cancel or reschedule prior to your appointment date.  This allows [place] the opportunity to schedule an appointment for another patient in need of care.  If you do not cancel or reschedule by 5 p.m. the business day prior to your appointment date, you will be charged a $50.00 late cancellation/no-show fee.    Thank you for choosing [facility] for your medical needs.  We appreciate the opportunity to care for you.  Please visit [place] at our website  to learn more about our practice.                     Sincerely,                                                             The Gastroenterology Division

## 2010-12-23 NOTE — Letter (Signed)
Summary: [facility]  [facility]   Imported By: Lanelle Bal 06/04/2010 09:14:42  _____________________________________________________________________  External Attachment:    Type:   Image     Comment:   External Document

## 2010-12-23 NOTE — Assessment & Plan Note (Signed)
Summary: yearly      Allergies Added:   Visit Type:  Follow-up Primary Provider:  Marga Melnick MD  CC:  chest pain and stomach pain.  History of Present Illness: Patient is 77 years old and return for management of CAD and her pacemaker. She had cardiac bypass surgery in 2001. She had a DD pacemaker which is a Medtronic pacemaker implanted for sick sinus syndrome.  She has been doing well has had no shortness of breath or palpitations. She has had some epigastric pain but this has not been exertional and does not sound anginal.  She has been seen by Dr. Alwyn Ren and has been found to be anemic and she has a low iron saturation.  Current Medications (verified): 1)  Glimepiride 2 Mg Tabs (Glimepiride) .... Take One Tablet  Once Daily 2)  Omeprazole 20 Mg  Cpdr (Omeprazole) .Marland Kitchen.. 1 By Mouth Once Daily 3)  Metformin Hcl 1000 Mg  Tabs (Metformin Hcl) .... Take One Tablet Twice Daily 4)  Digoxin 0.125 Mg  Tabs (Digoxin) .... Take One Daily Except 1 and 1/2 Tab Q Wed 5)  Simvastatin 40 Mg  Tabs (Simvastatin) .... Take One Tab Daily 6)  Diclofenac Sodium 50 Mg Tbec (Diclofenac Sodium) .Marland Kitchen.. 1 By Mouth Once Daily 7)  Niacin 500 Mg  Tbcr (Niacin) .... Take One Tablet Daily 8)  Fish Oil 1000 Mg Caps (Omega-3 Fatty Acids) .Marland Kitchen.. 1 By Mouth Once Daily 9)  Metoprolol Succinate 200 Mg  Tb24 (Metoprolol Succinate) .... Take One Tab Daily 10)  Levothyroxine Sodium 25 Mcg  Tabs (Levothyroxine Sodium) .... Take One Tablet Daily Except 1 & 1/2 Weds 11)  Enalapril Maleate 20 Mg  Tabs (Enalapril Maleate) .... Take One Tablet Daily 12)  Adult Aspirin Ec Low Strength 81 Mg  Tbec (Aspirin) 13)  Januvia 100 Mg  Tabs (Sitagliptin Phosphate) .Marland Kitchen.. 1qd 14)  Hydrochlorothiazide 25 Mg  Tabs (Hydrochlorothiazide) .Marland Kitchen.. 1 By Mouth Once Daily 15)  Calcium 500 Mg Tabs (Calcium Carbonate) .Marland Kitchen.. 1 By Mouth Once Daily 16)  Nitrostat 0.3 Mg Subl (Nitroglycerin) .Marland Kitchen.. 1 Under Tongue As Needed Chest Pain 17)  Nitrofurantoin  Monohyd Macro 100 Mg Caps (Nitrofurantoin Monohyd Macro) .Marland Kitchen.. 1 Two Times A Day 18)  Cipro 500 Mg Tabs (Ciprofloxacin Hcl) .Marland Kitchen.. 1 Two Times A Day 19)  Metronidazole 250 Mg Tabs (Metronidazole) .Marland Kitchen.. 1 Three Times A Day  Allergies (verified): 1)  Codeine  Past History:  Past Medical History: Reviewed history from 09/16/2008 and no changes required. Colonic polyps, hx of Diabetes mellitus, type I coronary artery disease  1. Coronary artery disease status post coronary bypass graft surgery     2001. 2. Sick sinus syndrome status post Medtronic DDD pacemaker implanted     with good pacer function not pacer dependent. 3. Hypertension. 4. Diabetes. 5. Hyperlipidemia.   Review of Systems       ROS is negative except as outlined in HPI.   Vital Signs:  Patient profile:   77 year old female Height:      66 inches Weight:      183 pounds BMI:     29.64 Pulse rate:   74 / minute BP sitting:   125 / 68  (left arm) Cuff size:   large  Vitals Entered By: Burnett Kanaris, CNA (December 26, 2009 12:03 PM)  Physical Exam  Additional Exam:  Gen. Well-nourished, in no distress   Neck: No JVD, thyroid not enlarged, no carotid bruits Lungs: No tachypnea, clear without  rales, rhonchi or wheezes Cardiovascular: Rhythm regular, PMI not displaced,  heart sounds  normal, no murmurs or gallops, no peripheral edema, pulses normal in all 4 extremities. Abdomen: BS normal, abdomen soft and non-tender without masses or organomegaly, no hepatosplenomegaly. MS: No deformities, no cyanosis or clubbing   Neuro:  No focal sns   Skin:  no lesions    PPM Specifications PPM Vendor:  Medtronic     PPM Model Number:  [id number]     PPM Serial Number:  [id number] PPM DOI:  03/25/2000     PPM Implanting MD:  Everardo Beals. Juanda Chance, MD  Lead 1    Location: RA     DOI: 03/25/2000     Model #: [id number]     Serial #: [id number]     Status: active Lead 2    Location: RV     DOI: 03/25/2000     Model #: [id number]     Serial #:  [id number]     Status: active  Magnet Response Rate:  BOL 85 ERI 65  Indications:  Tachy-brady syndrome   PPM Follow Up Remote Check?  No Battery Voltage:  2.72 V     Battery Est. Longevity:  17 months     Pacer Dependent:  Yes       PPM Device Measurements Atrium  Impedance: 462 ohms, Threshold: 0.5 V at 0.4 msec Right Ventricle  Amplitude: 11.2 mV, Impedance: 804 ohms, Threshold: 1.0 V at 0.4 msec  Episodes MS Episodes:  0     Percent Mode Switch:  0     Ventricular High Rate:  0     Atrial Pacing:  100     Ventricular Pacing:  33  Parameters Mode:  DDDR     Lower Rate Limit:  70     Upper Rate Limit:  120 Paced AV Delay:  320     Sensed AV Delay:  300 Rate Response Parameters:  low/7 Tech Comments:  No changes made. Nearing ERI approx. 1.5 years.  Impression & Recommendations:  Problem # 1:  PACEMAKER (ICD-V45.Marland Kitchen01) We interrogated the pacemaker today and the function was good. She is atrial pacing all the time and she is pacing the ventricle about one third of the time. She is program with a long AV delay.  Problem # 2:  CAD, AUTOLOGOUS BYPASS GRAFT (ICD-414.02) She had bypass surgery in 2001. She had some epigastric pain but this is nonexertional and doesn't sound anginal. We will continue current therapy. We may consider a followup Myoview scan in the next visit since she will be more than 10 years from bypass surgery. Her updated medication list for this problem includes:    Metoprolol Succinate 200 Mg Tb24 (Metoprolol succinate) .Marland Kitchen... Take one tab daily    Enalapril Maleate 20 Mg Tabs (Enalapril maleate) .Marland Kitchen... Take one tablet daily    Adult Aspirin Ec Low Strength 81 Mg Tbec (Aspirin)    Nitrostat 0.3 Mg Subl (Nitroglycerin) .Marland Kitchen... 1 under tongue as needed chest pain  Other Orders: EKG w/ Interpretation (93000)  Patient Instructions: 1)  Your physician wants you to follow-up in: 1 year with Dr. Hillis Range.  You will receive a reminder letter in the mail two months in  advance. If you don't receive a letter, please call our office to schedule the follow-up appointment. 2)  Call Dr. Frederik Pear office to followup on your labwork. Prescriptions: HYDROCHLOROTHIAZIDE 25 MG  TABS (HYDROCHLOROTHIAZIDE) 1 by mouth once daily  #90 x 3  Entered by:   Sherri Rad, RN, BSN   Authorized by:   Lenoria Farrier, MD, [facility]   Signed by:   Sherri Rad, RN, BSN on 12/26/2009   Method used:   Faxed to ...       [facility]       [address] [place], [place]  [address]       Ph: [phone]       Fax: [phone]   RxID:   [id number] ENALAPRIL MALEATE 20 MG  TABS (ENALAPRIL MALEATE) take one tablet daily  #90 x 3   Entered by:   Sherri Rad, RN, BSN   Authorized by:   Lenoria Farrier, MD, [facility]   Signed by:   Sherri Rad, RN, BSN on 12/26/2009   Method used:   Faxed to ...       [facility] (mail[facility]order)       [address] [place], [place]  [address]       Ph: [phone]       Fax: [phone]   RxID:   [id number] METOPROLOL SUCCINATE 200 MG  TB24 (METOPROLOL SUCCINATE) take one tab daily  #90 x 3   Entered by:   Sherri Rad, RN, BSN   Authorized by:   Lenoria Farrier, MD, [facility]   Signed by:   Sherri Rad, RN, BSN on 12/26/2009   Method used:   Faxed to ...       [facility] (mail[facility]order)       [address] [place], [place]  [address]       Ph: [phone]       Fax: [phone]   RxID:   [id number] DICLOFENAC SODIUM 50 MG TBEC (DICLOFENAC SODIUM) 1 by mouth once daily  #90 x 3   Entered by:   Sherri Rad, RN, BSN   Authorized by:   Lenoria Farrier, MD, [facility]   Signed by:   Sherri Rad, RN, BSN on 12/26/2009   Method used:   Faxed to ...       [facility] (mail[facility])       [address] [place], [place]  [address]       Ph: [phone]       Fax: [phone]   RxID:   [id number] SIMVASTATIN 40 MG  TABS (SIMVASTATIN) take one tab daily  #90 x 3   Entered by:   Sherri Rad, RN, BSN   Authorized by:   Lenoria Farrier, MD, [facility]   Signed by:   Sherri Rad, RN, BSN on 12/26/2009   Method used:   Faxed to ...       [facility] (mail[facility]order)       [address] [place], [place]  [address]       Ph: [phone]       Fax: [phone]   RxID:   [id number] DIGOXIN 0.125 MG  TABS (DIGOXIN) take one daily except 1 and 1/2 tab q wed  #135 x 3   Entered by:   Sherri Rad, RN, BSN   Authorized by:   Lenoria Farrier, MD, [facility]   Signed by:   Sherri Rad, RN, BSN on 12/26/2009   Method used:   Faxed to ...       [facility]  [address] [place], [place]  [address]       Ph: [phone]       Fax: [phone]   RxID:   [id number]

## 2010-12-23 NOTE — Assessment & Plan Note (Signed)
Summary: constipation/bloating--ch.   History of Present Illness Visit Type: follow up Primary GI MD: Elie Goody MD [facility] Primary Provider: Marga Melnick, MD Chief Complaint: constipation, lower abdominal and bloating History of Present Illness:   This is an 77 year old female with the recent onset of constipation associated bloating and with left lower quadrant and right lower abdominal pain. Her symptoms have improved substantially with the use of a stool softener for the past few weeks. Recent Hemoccults obtained by Dr. Alwyn Ren, were negative. Her last colonoscopy was in September 2009 which showed diverticulosis and a small, hyperplastic colon polyps.   GI Review of Systems    Reports abdominal pain, acid reflux, and  chest pain.     Location of  Abdominal pain: lower abdomen.    Denies belching, bloating, dysphagia with liquids, dysphagia with solids, heartburn, loss of appetite, nausea, vomiting, vomiting blood, weight loss, and  weight gain.      Reports change in bowel habits, constipation, diarrhea, and  diverticulosis.     Denies anal fissure, black tarry stools, fecal incontinence, heme positive stool, hemorrhoids, irritable bowel syndrome, jaundice, light color stool, liver problems, rectal bleeding, and  rectal pain.   Current Medications (verified): 1)  Glimepiride 2 Mg Tabs (Glimepiride) .... Take One Tablet  Once Daily 2)  Omeprazole 20 Mg  Cpdr (Omeprazole) .Marland Kitchen.. 1 By Mouth Once Daily 3)  Metformin Hcl 1000 Mg  Tabs (Metformin Hcl) .... Take One Tablet Twice Daily 4)  Digoxin 0.125 Mg  Tabs (Digoxin) .... Take One Daily Except 1 and 1/2 Tab Q Wed 5)  Simvastatin 40 Mg  Tabs (Simvastatin) .... Take One Tab Daily 6)  Diclofenac Sodium 50 Mg Tbec (Diclofenac Sodium) .Marland Kitchen.. 1 By Mouth Once Daily 7)  Niacin 500 Mg  Tbcr (Niacin) .... Take One Tablet Daily 8)  Fish Oil 1000 Mg Caps (Omega-3 Fatty Acids) .Marland Kitchen.. 1 By Mouth Once Daily 9)  Metoprolol Succinate 200 Mg  Tb24  (Metoprolol Succinate) .... Take One Tab Daily 10)  Levothyroxine Sodium 25 Mcg  Tabs (Levothyroxine Sodium) .... Take One Tablet Daily Except 1 & 1/2 Weds 11)  Enalapril Maleate 20 Mg  Tabs (Enalapril Maleate) .... Take One Tablet Daily 12)  Adult Aspirin Ec Low Strength 81 Mg  Tbec (Aspirin) 13)  Januvia 100 Mg  Tabs (Sitagliptin Phosphate) .Marland Kitchen.. 1qd 14)  Hydrochlorothiazide 25 Mg  Tabs (Hydrochlorothiazide) .Marland Kitchen.. 1 By Mouth Once Daily 15)  Calcium 500 Mg Tabs (Calcium Carbonate) .Marland Kitchen.. 1 By Mouth Once Daily 16)  Nitrostat 0.3 Mg Subl (Nitroglycerin) .Marland Kitchen.. 1 Under Tongue As Needed Chest Pain  Allergies (verified): 1)  Codeine  Past History:  Past Medical History: Diabetes mellitus, type I coronary artery disease 1. Coronary artery disease status post coronary bypass graft surgery     2001. 2. Sick sinus syndrome status post Medtronic DDD pacemaker implanted     with good pacer function not pacer dependent. 3. Hypertension. 4. Diabetes. 5. Hyperlipidemia.  Peptic stricture Erosive esophagitis Sliding hiatal hernia GERD Diverticulosis Adenomatous Colon Polyps 07/2002 Arthritis Hypothyroidism  Past Surgical History: CABG-5 VESSEL-02/2000 PACEMAKER CP 2*- HH 01/2001 t&a APPENDECTOMY CHOLECYSTECTOMY TAH&BSO-ENDOMETIOSIS tkr RIGHTR- 06/2003 2003 COLONSCOPY-POLYPS 05/2005-CATARACTS SURGERY Knee replacement  Family History: Reviewed history from 02/12/2010 and no changes required. Family History of Colon Cancer:Mother developed at age 67 and a brother at age 61. Family History of Breast Cancer:mother and sister Family History of Prostate Cancer:brother  Social History: Reviewed history from 01/10/2009 and no changes required. she lives with her  grandson and his wife.  She does not smoke. 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         The pertinent positives and negatives are noted as above and in the HPI. All other ROS were reviewed and were negative.   Vital Signs:  Patient profile:   77 year old female Height:      66 inches Weight:      188 pounds BMI:     30.45 Pulse rate:   88 / minute Pulse rhythm:   regular BP sitting:   126 / 66  (left arm)  Vitals Entered By: Milford Cage NCMA (February 13, 2010 10:21 AM)  Physical Exam  General:  Well developed, well nourished, no acute distress. Head:  Normocephalic and atraumatic. Eyes:  PERRLA, no icterus. Ears:  Normal auditory acuity. Mouth:  No deformity or lesions, dentition normal. Neck:  Supple; no masses or thyromegaly. Lungs:  Clear throughout to auscultation. Heart:  Regular rate and rhythm; no murmurs, rubs,  or bruits. Abdomen:  Soft, nontender and nondistended. No masses, hepatosplenomegaly or hernias noted. Normal bowel sounds. Rectal:  Normal exam. hemocult negative brown stool.   Msk:  Symmetrical with no gross deformities. Normal posture. Pulses:  Normal pulses noted. Extremities:  No clubbing, cyanosis, edema or deformities noted. Neurologic:  Alert and  oriented x4;  grossly normal neurologically. Cervical Nodes:  No significant cervical adenopathy. Inguinal Nodes:  No significant inguinal adenopathy. Psych:  Alert and cooperative. Normal mood and affect.  Impression & Recommendations:  Problem # 1:  CONSTIPATION (ICD-564.00) Recent onset constipation, with lower abdominal pain. All symptoms had improved with the use of stool softeners. Colonoscopy September 2009 unremarkable. Begin a high fiber diet and increase fluid intake. Continue stool softeners. If her symptoms do not completely resolve with the above measures, consider repeat colonoscopy before the planned date of September 2012.  Problem # 2:  ABDOMINAL  PAIN-MULTIPLE SITES (ICD-789.09) Assessment: New As in problem #1.  Problem # 3:  COLONIC POLYPS, HX OF (ICD-V12.72) Surveillance colonoscopy recommended September 2012.  Problem # 4:  FAMILY HX COLON CANCER (ICD-V16.0) As in problem #3.  Patient Instructions: 1)  Pick up your prescription from your pharmacy.  2)  Start Benefiber once daily. 3)  High Fiber, Low Fat  Healthy Eating Plan brochure given.  4)  Constipation and Hemorrhoids brochure given.  5)  Copy sent to : Marga Melnick, MD 6)  The medication list was reviewed and reconciled.  All changed / newly prescribed medications were explained.  A complete medication list was provided to the patient / caregiver. 7)  Please schedule a follow-up appointment as needed.   Prescriptions: OMEPRAZOLE 20 MG  CPDR (OMEPRAZOLE) 1 by mouth once daily  #90 x 3   Entered by:   Christie Nottingham CMA (AAMA)   Authorized by:   Meryl Dare MD [facility]   Signed by:   Christie Nottingham CMA (AAMA) on 02/13/2010   Method used:   Faxed to ...       [facility] (mail[facility]       [address] [place], [place]  [address]       Ph: [phone]  Fax: [phone]   RxID:   [id number]

## 2010-12-23 NOTE — Letter (Signed)
[facility]   Imported By: Lanelle Bal 08/21/2010 10:09:54  _____________________________________________________________________  External Attachment:    Type:   Image     Comment:   External Document

## 2010-12-23 NOTE — Letter (Signed)
Summary: Results Follow up Letter  [facility]  [address] [place], [place] [address]   Phone: [phone]  Fax: [phone]    11/28/2009 MRN: [id number]  [facility] [address] [place], [place]  [address]  Dear Ms. Trueheart,  The following are the results of your recent test(s):  Test         Result    Pap Smear:        Normal _____  Not Normal _____ Comments: ______________________________________________________ Cholesterol: LDL(Bad cholesterol):         Your goal is less than:         HDL (Good cholesterol):       Your goal is more than: Comments:  ______________________________________________________ Mammogram:        Normal _____  Not Normal _____ Comments:  ___________________________________________________________________ Hemoccult:        Normal __X___  Not normal _______ Comments:    _____________________________________________________________________ Other Tests:    We routinely do not discuss normal results over the telephone.  If you desire a copy of the results, or you have any questions about this information we can discuss them at your next office visit.   Sincerely,

## 2010-12-23 NOTE — Assessment & Plan Note (Signed)
Summary: 2 mth fu/ns/kdc   Vital Signs:  Patient profile:   77 year old female Weight:      184.4 pounds Pulse rate:   72 / minute Resp:     17 per minute BP sitting:   122 / 68  (right arm) Cuff size:   large  Vitals Entered By: Shonna Chock (January 14, 2010 10:33 AM) CC: Follow-up visit: Discuss labs (copy given) Comments REVIEWED MED LIST, PATIENT AGREED DOSE AND INSTRUCTION CORRECT    Primary Care Provider:  Marga Melnick MD  CC:  Follow-up visit: Discuss labs (copy given).  History of Present Illness: Labs reviewed & risks discussed; on [employer] . No CVE. Dr Regino Schultze Card OV reviewed.  Allergies: 1)  Codeine  Review of Systems ENT:  Denies difficulty swallowing. CV:  Complains of shortness of breath with exertion; denies chest pain or discomfort, leg cramps with exertion, and palpitations. GI:  Complains of indigestion; denies abdominal pain, bloody stools, and dark tarry stools; FOB negative; colonoscopy will be scheduled.   Impression & Recommendations:  Problem # 1:  HYPERLIPIDEMIA-MIXED (ICD-272.4) TG markedly elevatedwhich compromises DM control Her updated medication list for this problem includes:    Simvastatin 40 Mg Tabs (Simvastatin) .Marland Kitchen... Take one tab daily    Niacin 500 Mg Tbcr (Niacin) .Marland Kitchen... Take one tablet daily  Problem # 2:  DIABETES-TYPE 2 (ICD-250.00)  Her updated medication list for this problem includes:    Glimepiride 2 Mg Tabs (Glimepiride) .Marland Kitchen... Take one tablet  once daily    Metformin Hcl 1000 Mg Tabs (Metformin hcl) .Marland Kitchen... Take one tablet twice daily    Enalapril Maleate 20 Mg Tabs (Enalapril maleate) .Marland Kitchen... Take one tablet daily    Adult Aspirin Ec Low Strength 81 Mg Tbec (Aspirin)    Januvia 100 Mg Tabs (Sitagliptin phosphate) .Marland Kitchen... 1qd  Problem # 3:  ANEMIA, MILD (ICD-285.9) B12, folate , iron panel & stool cards normal. GI F/U to be scheduled by her  Complete Medication List: 1)  Glimepiride 2 Mg Tabs (Glimepiride) ....  Take one tablet  once daily 2)  Omeprazole 20 Mg Cpdr (Omeprazole) .Marland Kitchen.. 1 by mouth once daily 3)  Metformin Hcl 1000 Mg Tabs (Metformin hcl) .... Take one tablet twice daily 4)  Digoxin 0.125 Mg Tabs (Digoxin) .... Take one daily except 1 and 1/2 tab q wed 5)  Simvastatin 40 Mg Tabs (Simvastatin) .... Take one tab daily 6)  Diclofenac Sodium 50 Mg Tbec (Diclofenac sodium) .Marland Kitchen.. 1 by mouth once daily 7)  Niacin 500 Mg Tbcr (Niacin) .... Take one tablet daily 8)  Fish Oil 1000 Mg Caps (Omega-3 fatty acids) .Marland Kitchen.. 1 by mouth once daily 9)  Metoprolol Succinate 200 Mg Tb24 (Metoprolol succinate) .... Take one tab daily 10)  Levothyroxine Sodium 25 Mcg Tabs (Levothyroxine sodium) .... Take one tablet daily except 1 & 1/2 weds 11)  Enalapril Maleate 20 Mg Tabs (Enalapril maleate) .... Take one tablet daily 12)  Adult Aspirin Ec Low Strength 81 Mg Tbec (Aspirin) 13)  Januvia 100 Mg Tabs (Sitagliptin phosphate) .Marland Kitchen.. 1qd 14)  Hydrochlorothiazide 25 Mg Tabs (Hydrochlorothiazide) .Marland Kitchen.. 1 by mouth once daily 15)  Calcium 500 Mg Tabs (Calcium carbonate) .Marland Kitchen.. 1 by mouth once daily 16)  Nitrostat 0.3 Mg Subl (Nitroglycerin) .Marland Kitchen.. 1 under tongue as needed chest pain 17)  Nitrofurantoin Monohyd Macro 100 Mg Caps (Nitrofurantoin monohyd macro) .Marland Kitchen.. 1 two times a day 18)  Cipro 500 Mg Tabs (Ciprofloxacin hcl) .Marland Kitchen.. 1 two times a day  19)  Metronidazole 250 Mg Tabs (Metronidazole) .Marland Kitchen.. 1 three times a day  Patient Instructions: 1)  Consume LESS THAN 40 grams of sugar / day from foods & drinks with High Fructose Corn Syrup as #1 2 or # 3 on label. Take Simvastatin 40 mg M, W, F & Sun eve. Take Antara 130 mg Colin Benton & Sat eve. 2)  Please schedule a follow-up appointment in 3 months. 3)  Hepatic Panel prior to visit, ICD-9: 4)  Lipid Panel prior to visit, ICD-9: 5)  HbgA1C prior to visit, ICD-9:  Appended Document: 2 mth fu/ns/kdc Note: I examined patient in addition to discussing labs but inadvertently did not enter  findings.The  + findings were:bilat carotid bruits, G 1 / 6 systolic murmur. No edema. Chest clear; no increased WOB. Abd non tender.

## 2010-12-23 NOTE — Progress Notes (Signed)
Summary: Refill Request  Phone Note Refill Request Call back at [phone] Message from:  Pharmacy on August 22, 2010 8:15 AM  Refills Requested: Medication #1:  TRILIPIX 135 MG CPDR 1 once daily.   Dosage confirmed as above?Dosage Confirmed   Supply Requested: 3 months   Last Refilled: 07/24/2010 [facility]  Next Appointment Scheduled: 1.10.12 Initial call taken by: Harold Barban,  August 22, 2010 8:15 AM  Follow-up for Phone Call        RX faxed to [phone] Follow-up by: Shonna Chock CMA,  August 22, 2010 8:56 AM    Prescriptions: TRILIPIX 135 MG CPDR (CHOLINE FENOFIBRATE) 1 once daily  #90 x 1   Entered by:   Shonna Chock CMA   Authorized by:   Marga Melnick MD   Signed by:   Shonna Chock CMA on 08/22/2010   Method used:   Print then Give to Patient   RxID:   [id number]

## 2010-12-23 NOTE — Procedures (Signed)
Summary: Colonoscopy   Colonoscopy  Procedure date:  07/26/2002  Findings:      Results: Diverticulosis.       Pathology:  Adenomatous polyp.        Location:  [facility].    Procedures Next Due Date:    Colonoscopy: 07/2007 Patient Name: [name], [name] MRN:  Procedure Procedures: Colonoscopy CPT: 8780799024.    with polypectomy. CPT: [id number].  Personnel: Endoscopist: Venita Lick. Russella Dar, MD, Clementeen Graham.  Exam Location: Exam performed in Outpatient Clinic. Outpatient  Patient Consent: Procedure, Alternatives, Risks and Benefits discussed, consent obtained, from patient. Consent was obtained by the RN.  Indications Symptoms: Diarrhea  Increased Risk Screening: For family history of colorectal neoplasia, in  parent age at onset: 28. sibling age at onset: 3.  Comments: Mother and brother with colon ca. History  Pre-Exam Physical: Performed Jul 26, 2002. Cardio-pulmonary exam, Rectal exam, Abdominal exam, Neurological exam, Mental status exam WNL.  Exam Exam: Extent of exam reached: Cecum, extent intended: Cecum.  The cecum was identified by appendiceal orifice and IC valve. Colon retroflexion performed. ASA Classification: II. Tolerance: good.  Monitoring: Pulse and BP monitoring, Oximetry used. Supplemental O2 given.  Colon Prep Used Golytely for colon prep. Prep results: good.  Sedation Meds: Patient assessed and found to be appropriate for moderate (conscious) sedation. Fentanyl 100 mcg. given IV. Versed 4 mg. given IV.  Findings NORMAL EXAM: Cecum to Transverse Colon.  - DIVERTICULOSIS: Transverse Colon to Sigmoid Colon. Not bleeding. ICD9: Diverticulosis: 562.10.  POLYP: Sigmoid Colon, Maximum size: 7 mm. sessile polyp. Distance from Anus 25 cm. Procedure:  snare with cautery, removed, retrieved, Polyp sent to pathology. ICD9: Colon Polyps: 211.3.  NORMAL EXAM: Rectum.   Assessment  Diagnoses: 211.3: Colon Polyps.  562.10: Diverticulosis.    Events  Unplanned Interventions: No intervention was required.  Unplanned Events: There were no complications. Plans  Post Exam Instructions: No aspirin or non-steroidal containing medications: 2 weeks.  Medication Plan: Await pathology. Continue current medications.  Patient Education: Patient given standard instructions for: Polyps. Diverticulosis. IBS.  Disposition: After procedure patient sent to recovery. After recovery patient sent home.  Scheduling/Referral: Colonoscopy, to [facility] T. Russella Dar, MD, [facility], around Jul 27, 2007.  Primary Care Provider, to Titus Dubin. Alwyn Ren, MD,    This report was created from the original endoscopy report, which was reviewed and signed by the above listed endoscopist.    cc: Titus Dubin. Alwyn Ren, MD

## 2010-12-24 ENCOUNTER — Ambulatory Visit: Payer: Medicare Other | Admitting: Internal Medicine

## 2010-12-24 ENCOUNTER — Ambulatory Visit: Admit: 2010-12-24 | Payer: Self-pay | Admitting: Internal Medicine

## 2010-12-25 ENCOUNTER — Ambulatory Visit (INDEPENDENT_AMBULATORY_CARE_PROVIDER_SITE_OTHER): Payer: Medicare Other | Admitting: Family Medicine

## 2010-12-25 ENCOUNTER — Encounter: Payer: Self-pay | Admitting: Family Medicine

## 2010-12-25 DIAGNOSIS — R300 Dysuria: Secondary | ICD-10-CM | POA: Insufficient documentation

## 2010-12-25 LAB — CONVERTED CEMR LAB
Ketones, urine, test strip: NEGATIVE
Nitrite: NEGATIVE
Specific Gravity, Urine: 1.015
Urobilinogen, UA: 0.2
WBC Urine, dipstick: NEGATIVE
pH: 6

## 2010-12-25 NOTE — Assessment & Plan Note (Signed)
Summary: rto 4 months/cbs   Vital Signs:  Patient profile:   77 year old female Weight:      181.6 pounds BMI:     29.42 Pulse rate:   80 / minute Resp:     15 per minute BP sitting:   132 / 78  (left arm) Cuff size:   large  Vitals Entered By: Shonna Chock CMA (December 02, 2010 10:20 AM) CC: 4 month follow-up on labs (copy given), New Rx for Nitrostat   Primary Care Provider:  Marga Melnick, MD  CC:  4 month follow-up on labs (copy given) and New Rx for Nitrostat.  History of Present Illness: Hyperlipidemia Follow-Up      This is an 77 year old woman who presents for Hyperlipidemia follow-up.  The patient denies muscle aches, GI upset, abdominal pain, flushing, itching, constipation, diarrhea, and fatigue.  Other symptoms include dypsnea and pedal edema.  The patient denies the following symptoms: chest pain/pressure, palpitations, and syncope.  Compliance with medications (by patient report) has been near 100%.  Dietary compliance has been fair.  The patient reports no exercise.  Adjunctive measures currently used by the patient include niacin and fish oil supplements.  Labs reviewed ; TG 371; A1c 6.4%.  Allergies: 1)  Codeine  Physical Exam  General:  in no acute distress; alert,appropriate and cooperative throughout examination Lungs:  Normal respiratory effort, chest expands symmetrically. Lungs are clear to auscultation, no crackles or wheezes. Heart:  Normal rate and regular rhythm. S1 and S2 normal without gallop, murmur, click, rub . S4 Pulses:  R and L carotid,radial and posterior tibial pulses are full and equal bilaterally. Decreased DPP Extremities:  Lipidema   Impression & Recommendations:  Problem # 1:  HYPERLIPIDEMIA-MIXED (ICD-272.4)  Her updated medication list for this problem includes:    Niacin 500 Mg Tbcr (Niacin) .Marland Kitchen... Take one tablet daily    Trilipix 135 Mg Cpdr (Choline fenofibrate) .Marland Kitchen... 1 each am    Pravastatin Sodium 20 Mg Tabs (Pravastatin  sodium) .Marland Kitchen... 1 at bedtime  Problem # 2:  DIABETES MELLITUS, TYPE I (ICD-250.01) controlled Her updated medication list for this problem includes:    Glimepiride 2 Mg Tabs (Glimepiride) .Marland Kitchen... 1/2 once daily    Metformin Hcl 1000 Mg Tabs (Metformin hcl) .Marland Kitchen... 1 by mouth two times a day **labs due 11/2010**    Enalapril Maleate 20 Mg Tabs (Enalapril maleate) .Marland Kitchen... Take one tablet daily    Januvia 100 Mg Tabs (Sitagliptin phosphate) .Marland Kitchen... 1qd  Complete Medication List: 1)  Glimepiride 2 Mg Tabs (Glimepiride) .... 1/2 once daily 2)  Metformin Hcl 1000 Mg Tabs (Metformin hcl) .Marland Kitchen.. 1 by mouth two times a day **labs due 11/2010** 3)  Digoxin 0.125 Mg Tabs (Digoxin) .... Take one daily except 1 and 1/2 tab q wed 4)  Niacin 500 Mg Tbcr (Niacin) .... Take one tablet daily 5)  Fish Oil 1000 Mg Caps (Omega-3 fatty acids) .Marland Kitchen.. 1 by mouth once daily 6)  Metoprolol Succinate 200 Mg Tb24 (Metoprolol succinate) .... Take one tab daily 7)  Levothyroxine Sodium 25 Mcg Tabs (Levothyroxine sodium) .... Take one tablet daily except 1 & 1/2 weds 8)  Enalapril Maleate 20 Mg Tabs (Enalapril maleate) .... Take one tablet daily 9)  Plavix 75 Mg Tabs (Clopidogrel bisulfate) .Marland Kitchen.. 1 by mouth once daily 10)  Januvia 100 Mg Tabs (Sitagliptin phosphate) .Marland Kitchen.. 1qd 11)  Hydrochlorothiazide 25 Mg Tabs (Hydrochlorothiazide) .Marland Kitchen.. 1 by mouth once daily 12)  Calcium 500 Mg Tabs (  Calcium carbonate) .Marland Kitchen.. 1 by mouth once daily 13)  Nitrostat 0.3 Mg Subl (Nitroglycerin) .Marland Kitchen.. 1 under tongue as needed chest pain 14)  Zantac 150 Maximum Strength 150 Mg Tabs (Ranitidine hcl) .Marland Kitchen.. 1 two times a day pre meals in oplace of omeprazole 15)  Nasonex 50 Mcg/act Susp (Mometasone furoate) .... 2 sprays each nostril once daily as needed 16)  Trilipix 135 Mg Cpdr (Choline fenofibrate) .Marland Kitchen.. 1 each am 17)  Pravastatin Sodium 20 Mg Tabs (Pravastatin sodium) .Marland Kitchen.. 1 at bedtime  Patient Instructions: 1)  Please schedule a follow-up appointment in 3  months. 2)  Hepatic Panel prior to visit, ICD-9:995.20 3)  Lipid Panel prior to visit, ICD-9:272.4 4)  HbgA1C prior to visit, ICD-9:250.00. Avoid High Fructose Corn Syrup sugar as discussed. Prescriptions: PRAVASTATIN SODIUM 20 MG TABS (PRAVASTATIN SODIUM) 1 at bedtime  #90 x 0   Entered and Authorized by:   Marga Melnick MD   Signed by:   Marga Melnick MD on 12/02/2010   Method used:   Print then Give to Patient   RxID:   [id number]    Orders Added: 1)  Est. Patient Level III [30865]

## 2010-12-25 NOTE — Cardiovascular Report (Signed)
Summary: TTM   TTM   Imported By: Roderic Ovens 12/19/2010 16:13:43  _____________________________________________________________________  External Attachment:    Type:   Image     Comment:   External Document

## 2010-12-25 NOTE — Cardiovascular Report (Signed)
Summary: TTM   TTM   Imported By: Roderic Ovens 11/19/2010 15:42:36  _____________________________________________________________________  External Attachment:    Type:   Image     Comment:   External Document

## 2010-12-26 ENCOUNTER — Encounter: Payer: Self-pay | Admitting: Internal Medicine

## 2010-12-26 ENCOUNTER — Ambulatory Visit (INDEPENDENT_AMBULATORY_CARE_PROVIDER_SITE_OTHER): Payer: Medicare Other | Admitting: Internal Medicine

## 2010-12-26 DIAGNOSIS — I495 Sick sinus syndrome: Secondary | ICD-10-CM

## 2010-12-26 DIAGNOSIS — I5022 Chronic systolic (congestive) heart failure: Secondary | ICD-10-CM

## 2010-12-26 DIAGNOSIS — I2510 Atherosclerotic heart disease of native coronary artery without angina pectoris: Secondary | ICD-10-CM

## 2010-12-31 NOTE — Assessment & Plan Note (Signed)
Summary: ov per dr Juanda Chance   Vital Signs:  Patient profile:   77 year old female Height:      66 inches Weight:      181 pounds BMI:     29.32 Pulse rate:   78 / minute Pulse rhythm:   regular BP sitting:   119 / 67  (left arm) Cuff size:   large  Vitals Entered By: Danielle Rankin, CMA (December 26, 2010 12:00 PM)  Visit Type:  Pacemaker check Primary Provider:  Marga Melnick, MD  CC:  edema/ankles....sob at times....denies any other complaints today.  History of Present Illness: The patient presents today for routine cardiology followup. She reports doing reasonbly well since last being seen in our clinic by Dr Juanda Chance.  She did have a TIA last summer, but denies further neurologic episodes.  The patient denies symptoms of palpitations, chest pain, orthopnea, PND, lower extremity edema, dizziness, presyncope, or syncope.  She reports occasional chest heaviness with moderate exertion.  The patient is tolerating medications without difficulties and is otherwise without complaint today.   Current Medications (verified): 1)  Glimepiride 2 Mg Tabs (Glimepiride) .... 1/2 Once Daily 2)  Metformin Hcl 1000 Mg  Tabs (Metformin Hcl) .Marland Kitchen.. 1 By Mouth Two Times A Day **labs Due 11/2010** 3)  Digoxin 0.125 Mg  Tabs (Digoxin) .... Take One Daily Except 1 and 1/2 Tab Q Wed 4)  Niacin 500 Mg  Tbcr (Niacin) .... Take One Tablet Daily 5)  Fish Oil 1000 Mg Caps (Omega-3 Fatty Acids) .Marland Kitchen.. 1 By Mouth Once Daily 6)  Metoprolol Succinate 200 Mg  Tb24 (Metoprolol Succinate) .... Take One Tab Daily 7)  Levothyroxine Sodium 25 Mcg  Tabs (Levothyroxine Sodium) .... Take One Tablet Daily Except 1 & 1/2 Weds **labs Due 02/2011** 8)  Enalapril Maleate 20 Mg  Tabs (Enalapril Maleate) .... Take One Tablet Daily 9)  Plavix 75 Mg Tabs (Clopidogrel Bisulfate) .Marland Kitchen.. 1 By Mouth Once Daily 10)  Januvia 100 Mg  Tabs (Sitagliptin Phosphate) .Marland Kitchen.. 1qd 11)  Hydrochlorothiazide 25 Mg  Tabs (Hydrochlorothiazide) .Marland Kitchen.. 1 By Mouth  Once Daily 12)  Calcium 500 Mg Tabs (Calcium Carbonate) .Marland Kitchen.. 1 By Mouth Once Daily 13)  Nitrostat 0.3 Mg Subl (Nitroglycerin) .Marland Kitchen.. 1 Under Tongue As Needed Chest Pain 14)  Zantac 150 Maximum Strength 150 Mg Tabs (Ranitidine Hcl) .Marland Kitchen.. 1 Two Times A Day Pre Meals in [place] of Omeprazole 15)  Nasonex 50 Mcg/act Susp (Mometasone Furoate) .... 2 Sprays Each Nostril Once Daily As Needed 16)  Trilipix 135 Mg Cpdr (Choline Fenofibrate) .Marland Kitchen.. 1 Each Am 17)  Pravastatin Sodium 20 Mg Tabs (Pravastatin Sodium) .Marland Kitchen.. 1 At Bedtime  Allergies: 1)  Codeine  Past History:  Past Medical History: Diabetes mellitus, type I coronary artery disease 1. Coronary artery disease status post coronary bypass graft surgery     2001. 2. Sick sinus syndrome status post Medtronic DDD pacemaker implanted  3. Hypertension. 4. Diabetes. 5. Hyperlipidemia.  Peptic stricture Erosive esophagitis Sliding hiatal hernia GERD Diverticulosis Adenomatous Colon Polyps 07/2002 Arthritis Hypothyroidism  Past Surgical History: Reviewed history from 02/13/2010 and no changes required. CABG-5 VESSEL-02/2000 PACEMAKER CP 2*- HH 01/2001 t&a APPENDECTOMY CHOLECYSTECTOMY TAH&BSO-ENDOMETIOSIS tkr RIGHTR- 06/2003 2003 COLONSCOPY-POLYPS 05/2005-CATARACTS SURGERY Knee replacement  Social History: Reviewed history from 02/13/2010 and no changes required. she lives with her grandson and his wife.  She does not smoke. 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   Physical Exam  General:  elderly, NAD Head:  normocephalic and atraumatic Eyes:  PERRLA/EOM intact; conjunctiva and lids normal. Mouth:  Teeth, gums and palate normal. Oral mucosa normal. Neck:  supple Chest Wall:  pacemaker pocket is well healed Lungs:  Clear bilaterally to auscultation and percussion. Heart:  RRR, no  m/r/g Abdomen:  Bowel sounds positive; abdomen soft and non-tender without masses, organomegaly, or hernias noted. No hepatosplenomegaly. Msk:  Back normal, normal gait. Muscle strength and tone normal. Extremities:  No clubbing or cyanosis.  1+ BLE edema Neurologic:  Alert and oriented x 3. Skin:  Intact without lesions or rashes. Psych:  Normal affect.   Impression & Recommendations:  Problem # 1:  SICK SINUS/ TACHY-BRADY SYNDROME (ICD-427.81)  normal pacemaker function as above  Her updated medication list for this problem includes:    Metoprolol Succinate 200 Mg Tb24 (Metoprolol succinate) .Marland Kitchen... Take one tab daily    Enalapril Maleate 20 Mg Tabs (Enalapril maleate) .Marland Kitchen... Take one tablet daily    Plavix 75 Mg Tabs (Clopidogrel bisulfate) .Marland Kitchen... 1 by mouth once daily    Nitrostat 0.3 Mg Subl (Nitroglycerin) .Marland Kitchen... 1 under tongue as needed chest pain  Problem # 2:  HYPERTENSION, BENIGN (ICD-401.1)  stable  Her updated medication list for this problem includes:    Metoprolol Succinate 200 Mg Tb24 (Metoprolol succinate) .Marland Kitchen... Take one tab daily    Enalapril Maleate 20 Mg Tabs (Enalapril maleate) .Marland Kitchen... Take one tablet daily    Hydrochlorothiazide 25 Mg Tabs (Hydrochlorothiazide) .Marland Kitchen... 1 by mouth once daily  Problem # 3:  HYPERLIPIDEMIA-MIXED (ICD-272.4)  stable  Her updated medication list for this problem includes:    Niacin 500 Mg Tbcr (Niacin) .Marland Kitchen... Take one tablet daily    Trilipix 135 Mg Cpdr (Choline fenofibrate) .Marland Kitchen... 1 each am    Pravastatin Sodium 20 Mg Tabs (Pravastatin sodium) .Marland Kitchen... 1 at bedtime  Problem # 4:  CAD, AUTOLOGOUS BYPASS GRAFT (ICD-414.02)  no symptoms of CAD continue current medicine regimen  Her updated medication list for this problem includes:    Metoprolol Succinate 200 Mg Tb24 (Metoprolol succinate) .Marland Kitchen... Take one tab daily    Enalapril Maleate 20 Mg Tabs (Enalapril maleate) .Marland Kitchen... Take one tablet daily    Plavix 75 Mg Tabs (Clopidogrel  bisulfate) .Marland Kitchen... 1 by mouth once daily    Nitrostat 0.3 Mg Subl (Nitroglycerin) .Marland Kitchen... 1 under tongue as needed chest pain  Problem # 5:  CHRONIC SYSTOLIC HEART FAILURE (ICD-428.22)  stable salt restriction no changes  Her updated medication list for this problem includes:    Digoxin 0.125 Mg Tabs (Digoxin) .Marland Kitchen... Take one daily except 1 and 1/2 tab q wed    Metoprolol Succinate 200 Mg Tb24 (Metoprolol succinate) .Marland Kitchen... Take one tab daily    Enalapril Maleate 20 Mg Tabs (Enalapril maleate) .Marland Kitchen... Take one tablet daily    Plavix 75 Mg Tabs (Clopidogrel bisulfate) .Marland Kitchen... 1 by mouth once daily    Hydrochlorothiazide 25 Mg Tabs (Hydrochlorothiazide) .Marland Kitchen... 1 by mouth once daily    Nitrostat 0.3 Mg Subl (Nitroglycerin) .Marland Kitchen... 1 under tongue as needed chest pain  Patient Instructions: 1)  Your physician wants you to follow-up in:  6 months in the device clinic and 12 months with Dr Johney Frame  Bonita Quin will receive a reminder letter in the mail two months in advance. If you don't receive a letter, please call our office to schedule the follow-up appointment.  Prescriptions: NITROSTAT 0.3 MG SUBL (NITROGLYCERIN) 1 under tongue as needed chest pain  #25 x 5   Entered by:   Dennis Bast, RN, BSN   Authorized by:   Hillis Range, MD   Signed by:   Dennis Bast, RN, BSN on 12/26/2010   Method used:   Faxed to ...       [facility] (mail-order)       [address] [place], [place]  [address]       Ph: [phone]       Fax: [phone]   RxID:   [id number] HYDROCHLOROTHIAZIDE 25 MG  TABS (HYDROCHLOROTHIAZIDE) 1 by mouth once daily  #90 x 3   Entered by:   Dennis Bast, RN, BSN   Authorized by:   Hillis Range, MD   Signed by:   Dennis Bast, RN, BSN on 12/26/2010   Method used:   Faxed to ...       [facility] (mail-order)       [address] [place], [place]  [address]       Ph: [phone]       Fax: [phone]   RxID:   [id number] PLAVIX 75 MG TABS (CLOPIDOGREL BISULFATE) 1 by mouth  once daily  #90 x 3   Entered by:   Dennis Bast, RN, BSN   Authorized by:   Hillis Range, MD   Signed by:   Dennis Bast, RN, BSN on 12/26/2010   Method used:   Faxed to ...       [facility] (mail-order)       [address] [place], [place]  [address]       Ph: [phone]       Fax: [phone]   RxID:   [id number] ENALAPRIL MALEATE 20 MG  TABS (ENALAPRIL MALEATE) take one tablet daily  #90 x 3   Entered by:   Dennis Bast, RN, BSN   Authorized by:   Hillis Range, MD   Signed by:   Dennis Bast, RN, BSN on 12/26/2010   Method used:   Faxed to ...       [facility] (mail-order)       [address] [place], [place]  [address]       Ph: [phone]       Fax: [phone]   RxID:   [id number] METOPROLOL SUCCINATE 200 MG  TB24 (METOPROLOL SUCCINATE) take one tab daily  #90 x 3   Entered by:   Dennis Bast, RN, BSN   Authorized by:   Hillis Range, MD   Signed by:   Dennis Bast, RN, BSN on 12/26/2010   Method used:   Faxed to ...       [facility] (mail-order)       [address] [place], [place]  [address]       Ph: [phone]       Fax: [phone]   RxID:   [id number] DIGOXIN 0.125 MG  TABS (DIGOXIN) take one daily except 1 and 1/2 tab q wed  #135 x 3   Entered by:   Dennis Bast, RN, BSN   Authorized by:   Hillis Range, MD   Signed by:   Dennis Bast, RN, BSN on 12/26/2010   Method used:   Faxed to .Marland KitchenMarland Kitchen  [facility] (mail-order)       [address] [place], [place]  [address]       Ph: [phone]       Fax: [phone]   RxID:   [id number]       PPM Specifications PPM Vendor:  Medtronic     PPM Model Number:  [id number]     PPM Serial Number:  [id number] PPM DOI:  03/25/2000     PPM Implanting MD:  Everardo Beals. Juanda Chance, MD  Lead 1    Location: RA     DOI: 03/25/2000     Model #: [id number]     Serial #: [id number]     Status: active Lead 2    Location: RV     DOI: 03/25/2000     Model #: [id number]     Serial #: [id number]     Status:  active  Magnet Response Rate:  BOL 85 ERI 65  Indications:  Tachy-brady syndrome   PPM Follow Up Battery Voltage:  2.71 V     Battery Est. Longevity:  14 mths     Pacer Dependent:  Yes       PPM Device Measurements Atrium  Amplitude: PACED mV, Impedance: 530 ohms, Threshold: 0.50 V at 0.40 msec Right Ventricle  Amplitude: 11.20 mV, Impedance: 965 ohms, Threshold: 1.00 V at 0.40 msec  Episodes MS Episodes:  0     Ventricular High Rate:  0     Atrial Pacing:  98.5%     Ventricular Pacing:  30.8%  Parameters Mode:  DDDR     Lower Rate Limit:  70     Upper Rate Limit:  120 Paced AV Delay:  320     Sensed AV Delay:  300 Rate Response Parameters:  low/7 Next Cardiology Appt Due:  06/24/2011 Tech Comments:  NORMAL DEVICE FUNCTION.  NO EPISODES OR MODE SWITCHES.  NO CHANGES MADE. ROV IN 6 MTHS W/DEVICE CLINIC. Vella Kohler  December 26, 2010 2:57 PM

## 2010-12-31 NOTE — Progress Notes (Signed)
Summary: RX  Phone Note Refill Request Call back at Home Phone [phone] Message from:  Patient on December 23, 2010 3:27 PM  Refills Requested: Medication #1:  SYNTHROID 0.025 MG   Dosage confirmed as above?Dosage Confirmed   Supply Requested: 3 months  Medication #2:  ZANTAC 150 MAXIMUM STRENGTH 150 MG TABS 1 two times a day pre meals in oplace of Omeprazole MAIL ORDERS--[facility]   Initial call taken by: Freddy Jaksch,  December 23, 2010 3:24 PM    New/Updated Medications: LEVOTHYROXINE SODIUM 25 MCG  TABS (LEVOTHYROXINE SODIUM) take one tablet daily except 1 & 1/2 Weds **LABS DUE 02/2011** Prescriptions: ZANTAC 150 MAXIMUM STRENGTH 150 MG TABS (RANITIDINE HCL) 1 two times a day pre meals in oplace of Omeprazole  #180 x 1   Entered by:   Shonna Chock CMA   Authorized by:   Marga Melnick MD   Signed by:   Shonna Chock CMA on 12/24/2010   Method used:   Faxed to ...       [facility] (mail-order)       [address] [place], [place]  [address]       Ph: [phone]       Fax: [phone]   RxID:   [id number] LEVOTHYROXINE SODIUM 25 MCG  TABS (LEVOTHYROXINE SODIUM) take one tablet daily except 1 & 1/2 Weds **LABS DUE 02/2011**  #90 x 0   Entered by:   Shonna Chock CMA   Authorized by:   Marga Melnick MD   Signed by:   Shonna Chock CMA on 12/24/2010   Method used:   Faxed to ...       [facility] (mail-order)       [address] [place], [place]  [address]       Ph: [phone]       Fax: [phone]   RxID:   [id number]

## 2010-12-31 NOTE — Assessment & Plan Note (Signed)
Summary: dsyuria    Vital Signs:  Patient profile:   77 year old female Weight:      180 pounds BMI:     29.16 Pulse rate:   80 / minute BP sitting:   130 / 60  (left arm)  Vitals Entered By: Doristine Devoid CMA (December 25, 2010 9:42 AM) CC: dysuria x2 days went to UC last week given Bactrim   History of Present Illness: 77 yo woman here today for dysuria.  last week had burning w/ urination (hx of UTIs), hematuria- went to [facility] and was started on Bactrim.  sxs improved.  Tuesday night took last pill and had 'a tinge like it was recurring'.  yesterday afternoon again had burning.  this AM no pain.  'i just want to be sure everything's ok'.  Current Medications (verified): 1)  Glimepiride 2 Mg Tabs (Glimepiride) .... 1/2 Once Daily 2)  Metformin Hcl 1000 Mg  Tabs (Metformin Hcl) .Marland Kitchen.. 1 By Mouth Two Times A Day **labs Due 11/2010** 3)  Digoxin 0.125 Mg  Tabs (Digoxin) .... Take One Daily Except 1 and 1/2 Tab Q Wed 4)  Niacin 500 Mg  Tbcr (Niacin) .... Take One Tablet Daily 5)  Fish Oil 1000 Mg Caps (Omega-3 Fatty Acids) .Marland Kitchen.. 1 By Mouth Once Daily 6)  Metoprolol Succinate 200 Mg  Tb24 (Metoprolol Succinate) .... Take One Tab Daily 7)  Levothyroxine Sodium 25 Mcg  Tabs (Levothyroxine Sodium) .... Take One Tablet Daily Except 1 & 1/2 Weds **labs Due 02/2011** 8)  Enalapril Maleate 20 Mg  Tabs (Enalapril Maleate) .... Take One Tablet Daily 9)  Plavix 75 Mg Tabs (Clopidogrel Bisulfate) .Marland Kitchen.. 1 By Mouth Once Daily 10)  Januvia 100 Mg  Tabs (Sitagliptin Phosphate) .Marland Kitchen.. 1qd 11)  Hydrochlorothiazide 25 Mg  Tabs (Hydrochlorothiazide) .Marland Kitchen.. 1 By Mouth Once Daily 12)  Calcium 500 Mg Tabs (Calcium Carbonate) .Marland Kitchen.. 1 By Mouth Once Daily 13)  Nitrostat 0.3 Mg Subl (Nitroglycerin) .Marland Kitchen.. 1 Under Tongue As Needed Chest Pain 14)  Zantac 150 Maximum Strength 150 Mg Tabs (Ranitidine Hcl) .Marland Kitchen.. 1 Two Times A Day Pre Meals in Oplace of Omeprazole 15)  Nasonex 50 Mcg/act Susp (Mometasone Furoate) .... 2  Sprays Each Nostril Once Daily As Needed 16)  Trilipix 135 Mg Cpdr (Choline Fenofibrate) .Marland Kitchen.. 1 Each Am 17)  Pravastatin Sodium 20 Mg Tabs (Pravastatin Sodium) .Marland Kitchen.. 1 At Bedtime  Allergies (verified): 1)  Codeine  Review of Systems      See HPI  Physical Exam  General:  in no acute distress; alert,appropriate and cooperative throughout examination Abdomen:  soft, NT/ND, no rebound/guarding.  no suprapubic or CVA tenderness   Impression & Recommendations:  Problem # 1:  DYSURIA (ICD-788.1) Assessment New sxs have resolved on their own.  UA w/out evidence of infxn today.  PE WNL.  reviewed supportive care and red flags that should prompt return.  Pt expresses understanding and is in agreement w/ this plan. Orders: UA Dipstick w/o Micro (manual) ([address])  Complete Medication List: 1)  Glimepiride 2 Mg Tabs (Glimepiride) .... 1/2 once daily 2)  Metformin Hcl 1000 Mg Tabs (Metformin hcl) .Marland Kitchen.. 1 by mouth two times a day **labs due 11/2010** 3)  Digoxin 0.125 Mg Tabs (Digoxin) .... Take one daily except 1 and 1/2 tab q wed 4)  Niacin 500 Mg Tbcr (Niacin) .... Take one tablet daily 5)  Fish Oil 1000 Mg Caps (Omega-3 fatty acids) .Marland Kitchen.. 1 by mouth once daily 6)  Metoprolol Succinate  200 Mg Tb24 (Metoprolol succinate) .... Take one tab daily 7)  Levothyroxine Sodium 25 Mcg Tabs (Levothyroxine sodium) .... Take one tablet daily except 1 & 1/2 weds **labs due 02/2011** 8)  Enalapril Maleate 20 Mg Tabs (Enalapril maleate) .... Take one tablet daily 9)  Plavix 75 Mg Tabs (Clopidogrel bisulfate) .Marland Kitchen.. 1 by mouth once daily 10)  Januvia 100 Mg Tabs (Sitagliptin phosphate) .Marland Kitchen.. 1qd 11)  Hydrochlorothiazide 25 Mg Tabs (Hydrochlorothiazide) .Marland Kitchen.. 1 by mouth once daily 12)  Calcium 500 Mg Tabs (Calcium carbonate) .Marland Kitchen.. 1 by mouth once daily 13)  Nitrostat 0.3 Mg Subl (Nitroglycerin) .Marland Kitchen.. 1 under tongue as needed chest pain 14)  Zantac 150 Maximum Strength 150 Mg Tabs (Ranitidine hcl) .Marland Kitchen.. 1 two times a  day pre meals in oplace of omeprazole 15)  Nasonex 50 Mcg/act Susp (Mometasone furoate) .... 2 sprays each nostril once daily as needed 16)  Trilipix 135 Mg Cpdr (Choline fenofibrate) .Marland Kitchen.. 1 each am 17)  Pravastatin Sodium 20 Mg Tabs (Pravastatin sodium) .Marland Kitchen.. 1 at bedtime  Patient Instructions: 1)  Your urine is clear of infection (thank goodness!) 2)  Continue to drink plenty of fluids 3)  Call with any questions or concerns 4)  Hang in there!!   Orders Added: 1)  UA Dipstick w/o Micro (manual) [81002] 2)  Est. Patient Level III [[address]]    Laboratory Results   Urine Tests    Routine Urinalysis   Glucose: negative   (Normal Range: Negative) Bilirubin: negative   (Normal Range: Negative) Ketone: negative   (Normal Range: Negative) Spec. Gravity: 1.015   (Normal Range: 1.003-1.035) Blood: negative   (Normal Range: Negative) pH: 6.0   (Normal Range: 5.0-8.0) Protein: negative   (Normal Range: Negative) Urobilinogen: 0.2   (Normal Range: 0-1) Nitrite: negative   (Normal Range: Negative) Leukocyte Esterace: negative   (Normal Range: Negative)

## 2011-01-06 ENCOUNTER — Other Ambulatory Visit: Payer: Self-pay | Admitting: Family Medicine

## 2011-01-06 ENCOUNTER — Encounter: Payer: Self-pay | Admitting: Family Medicine

## 2011-01-06 ENCOUNTER — Ambulatory Visit (INDEPENDENT_AMBULATORY_CARE_PROVIDER_SITE_OTHER): Payer: Medicare Other | Admitting: Family Medicine

## 2011-01-06 DIAGNOSIS — R609 Edema, unspecified: Secondary | ICD-10-CM

## 2011-01-06 LAB — BASIC METABOLIC PANEL
CO2: 27 mEq/L (ref 19–32)
Calcium: 10.1 mg/dL (ref 8.4–10.5)
Creatinine, Ser: 1.8 mg/dL — ABNORMAL HIGH (ref 0.4–1.2)
GFR: 28.01 mL/min — ABNORMAL LOW (ref >60.00)
Sodium: 136 mEq/L (ref 135–145)

## 2011-01-07 ENCOUNTER — Telehealth (INDEPENDENT_AMBULATORY_CARE_PROVIDER_SITE_OTHER): Payer: Self-pay | Admitting: *Deleted

## 2011-01-13 ENCOUNTER — Other Ambulatory Visit: Payer: Self-pay | Admitting: Internal Medicine

## 2011-01-13 ENCOUNTER — Encounter (INDEPENDENT_AMBULATORY_CARE_PROVIDER_SITE_OTHER): Payer: Self-pay | Admitting: *Deleted

## 2011-01-13 ENCOUNTER — Other Ambulatory Visit (INDEPENDENT_AMBULATORY_CARE_PROVIDER_SITE_OTHER): Payer: Medicare Other

## 2011-01-13 DIAGNOSIS — R740 Nonspecific elevation of levels of transaminase and lactic acid dehydrogenase [LDH]: Secondary | ICD-10-CM

## 2011-01-13 LAB — BUN: BUN: 31 mg/dL — ABNORMAL HIGH (ref 6–23)

## 2011-01-13 LAB — CREATININE, SERUM: Creatinine, Ser: 1.6 mg/dL — ABNORMAL HIGH (ref 0.4–1.2)

## 2011-01-14 NOTE — Assessment & Plan Note (Signed)
Summary: ankles swollen/cbs   Vital Signs:  Patient profile:   77 year old female Weight:      181 pounds BMI:     29.32 Pulse rate:   70 / minute BP sitting:   120 / 58  (left arm)  Vitals Entered By: Doristine Devoid CMA (January 06, 2011 9:29 AM) CC: swellen in ankles painful and burning sensation    History of Present Illness: 77 yo woman here today for bilateral ankle edema.  reports she has had ankle swelling for 30 yrs but for a few days last week legs have been 'burning and itching' when they swelled.  yesterday was out shopping for most of the day and again had itching and burning.  swelling did not resolve last night as it normally does when lying down.  denies SOB, CP, swelling of hands.  did not eat anything high in sodium yesterday.  in shower this AM the warm water improved the sxs.  burning and itching are improving as swelling decreases.  Current Medications (verified): 1)  Glimepiride 2 Mg Tabs (Glimepiride) .... 1/2 Once Daily 2)  Metformin Hcl 1000 Mg  Tabs (Metformin Hcl) .Marland Kitchen.. 1 By Mouth Two Times A Day 3)  Digoxin 0.125 Mg  Tabs (Digoxin) .... Take One Daily Except 1 and 1/2 Tab Q Wed 4)  Niacin 500 Mg  Tbcr (Niacin) .... Take One Tablet Daily 5)  Fish Oil 1000 Mg Caps (Omega-3 Fatty Acids) .Marland Kitchen.. 1 By Mouth Once Daily 6)  Metoprolol Succinate 200 Mg  Tb24 (Metoprolol Succinate) .... Take One Tab Daily 7)  Levothyroxine Sodium 25 Mcg  Tabs (Levothyroxine Sodium) .... Take One Tablet Daily Except 1 & 1/2 Weds **labs Due 02/2011** 8)  Enalapril Maleate 20 Mg  Tabs (Enalapril Maleate) .... Take One Tablet Daily 9)  Plavix 75 Mg Tabs (Clopidogrel Bisulfate) .Marland Kitchen.. 1 By Mouth Once Daily 10)  Januvia 100 Mg  Tabs (Sitagliptin Phosphate) .Marland Kitchen.. 1qd 11)  Hydrochlorothiazide 25 Mg  Tabs (Hydrochlorothiazide) .Marland Kitchen.. 1 By Mouth Once Daily 12)  Calcium 500 Mg Tabs (Calcium Carbonate) .Marland Kitchen.. 1 By Mouth Once Daily 13)  Nitrostat 0.3 Mg Subl (Nitroglycerin) .Marland Kitchen.. 1 Under Tongue As Needed  Chest Pain 14)  Zantac 150 Maximum Strength 150 Mg Tabs (Ranitidine Hcl) .Marland Kitchen.. 1 Two Times A Day Pre Meals in [place]place of Omeprazole 15)  Nasonex 50 Mcg/act Susp (Mometasone Furoate) .... 2 Sprays Each Nostril Once Daily As Needed 16)  Trilipix 135 Mg Cpdr (Choline Fenofibrate) .Marland Kitchen.. 1 Each Am 17)  Pravastatin Sodium 20 Mg Tabs (Pravastatin Sodium) .Marland Kitchen.. 1 At Bedtime  Allergies (verified): 1)  Codeine  Past History:  Past medical, surgical, family and social histories (including risk factors) reviewed for relevance to current acute and chronic problems.  Past Medical History: Reviewed history from 12/26/2010 and no changes required. Diabetes mellitus, type I coronary artery disease 1. Coronary artery disease status post coronary bypass graft surgery     2001. 2. Sick sinus syndrome status post Medtronic DDD pacemaker implanted  3. Hypertension. 4. Diabetes. 5. Hyperlipidemia.  Peptic stricture Erosive esophagitis Sliding hiatal hernia GERD Diverticulosis Adenomatous Colon Polyps 07/2002 Arthritis Hypothyroidism  Past Surgical History: Reviewed history from 02/13/2010 and no changes required. CABG-5 VESSEL-02/2000 PACEMAKER CP 2*- HH 01/2001 t&a APPENDECTOMY CHOLECYSTECTOMY TAH&BSO-ENDOMETIOSIS tkr RIGHTR- 06/2003 2003 COLONSCOPY-POLYPS 05/2005-CATARACTS SURGERY Knee replacement  Family History: Reviewed history from 02/13/2010 and no changes required. Family History of Colon Cancer:Mother developed at age 82 and a brother at age 38. Family History of  Breast Cancer:mother and sister Family History of Prostate Cancer:brother  Social History: Reviewed history from 02/13/2010 and no changes required. she lives with her grandson and his wife.  She does not smoke. 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 Lungs are clear to auscultation, no crackles or wheezes. Heart:  Normal rate and regular rhythm. S1 and S2 normal without gallop, murmur, click, rub . S4 Pulses:  +2 DP Extremities:  no edema of feet bilaterally very large ankles and lower legs but edema is nonpitting no ulcers or wounds on feet bilaterally, sensation intact Skin:  very dry skin w/ excoriations on anterior lower legs bilaterally   Impression & Recommendations:  Problem # 1:  EDEMA- LOCALIZED (ICD-782.3) Assessment New pt's itching and burning are likely due to excessive LE edema distending skin and compressing nerves.  sxs are improving w/ resolution of edema.  will check labs and add lasix as needed for excessive swelling.  recommended moisturizing legs to relieve itching and prevent cracking.  pt in agreement. Her updated medication list for this problem includes:    Hydrochlorothiazide 25 Mg Tabs (Hydrochlorothiazide) .Marland Kitchen... 1 by mouth once daily    Furosemide 20 Mg Tabs (Furosemide) .Marland Kitchen... Take 1 tab by mouth as needed for extreme swelling  Orders: Venipuncture (84696) TLB-BMP (Basic Metabolic Panel-BMET) (80048-METABOL) Prescription Created Electronically [id number])  Complete Medication List: 1)  Glimepiride 2 Mg Tabs (Glimepiride) .... 1/2 once daily 2)  Metformin Hcl 1000 Mg Tabs (Metformin hcl) .Marland Kitchen.. 1 by mouth two times a day 3)  Digoxin 0.125 Mg Tabs (Digoxin) .... Take one daily except 1 and 1/2 tab q wed 4)  Niacin 500 Mg Tbcr (Niacin) .... Take one tablet daily 5)  Fish Oil 1000 Mg Caps (Omega-3 fatty acids) .Marland Kitchen.. 1 by mouth once daily 6)  Metoprolol Succinate 200 Mg Tb24 (Metoprolol succinate) .... Take one tab daily 7)  Levothyroxine Sodium 25 Mcg Tabs (Levothyroxine sodium) .... Take one tablet daily except 1 & 1/2 weds **labs due 02/2011** 8)  Enalapril Maleate 20 Mg Tabs (Enalapril maleate) .... Take one tablet daily 9)  Plavix 75  Mg Tabs (Clopidogrel bisulfate) .Marland Kitchen.. 1 by mouth once daily 10)  Januvia 100 Mg Tabs (Sitagliptin phosphate) .Marland Kitchen.. 1qd 11)  Hydrochlorothiazide 25 Mg Tabs (Hydrochlorothiazide) .Marland Kitchen.. 1 by mouth once daily 12)  Calcium 500 Mg Tabs (Calcium carbonate) .Marland Kitchen.. 1 by mouth once daily 13)  Nitrostat 0.3 Mg Subl (Nitroglycerin) .Marland Kitchen.. 1 under tongue as needed chest pain 14)  Zantac 150 Maximum Strength 150 Mg Tabs (Ranitidine hcl) .Marland Kitchen.. 1 two times a day pre meals in oplace of omeprazole 15)  Nasonex 50 Mcg/act Susp (Mometasone furoate) .... 2 sprays each nostril once daily as needed 16)  Trilipix 135 Mg Cpdr (Choline fenofibrate) .Marland Kitchen.. 1 each am 17)  Pravastatin Sodium 20 Mg Tabs (Pravastatin sodium) .Marland Kitchen.. 1 at bedtime 18)  Furosemide 20 Mg Tabs (Furosemide) .... Take 1 tab by mouth as needed for extreme swelling  Patient Instructions: 1)  Please follow up as needed 2)  The pain and burning you feel is most likely due to the increased swelling pushing on nerves 3)  Try and elevate your legs as much as possible 4)  Use Aveeno lotion on your legs to moisturize and help  with your itching 5)  Take the Furosemide as needed for increased swelling 6)  We'll notify you of your lab results 7)  Call with any questions or concerns 8)  Hang in there! Prescriptions: FUROSEMIDE 20 MG TABS (FUROSEMIDE) Take 1 tab by mouth as needed for extreme swelling  #30 x 1   Entered and Authorized by:   Neena Rhymes MD   Signed by:   Neena Rhymes MD on 01/06/2011   Method used:   Electronically to        [facility] [address] (retail)       [phone]       [place], [place]  [address]       Ph: [phone]       Fax: [phone]   RxID:   [id number]    Orders Added: 1)  Venipuncture [24401] 2)  TLB-BMP (Basic Metabolic Panel-BMET) [80048-METABOL] 3)  Est. Patient Level III [02725] 4)  Prescription Created Electronically [phone]

## 2011-01-14 NOTE — Progress Notes (Signed)
Summary: refill  Phone Note Refill Request Message from:  Fax from Pharmacy on January 07, 2011 9:06 AM  Refills Requested: Medication #1:  JANUVIA 100 MG  TABS 1qd Jocelyn Lamer - fax [phone]  Initial call taken by: Okey Regal Spring,  January 07, 2011 9:07 AM    Prescriptions: JANUVIA 100 MG  TABS (SITAGLIPTIN PHOSPHATE) 1qd  #90 x 0   Entered by:   Shonna Chock CMA   Authorized by:   Marga Melnick MD   Signed by:   Shonna Chock CMA on 01/07/2011   Method used:   Faxed to ...       [facility] (mail-order)       [address] [place], [place]  [address]       Ph: [phone]       Fax: [phone]   RxID:   [id number]

## 2011-01-28 ENCOUNTER — Encounter: Payer: Self-pay | Admitting: Internal Medicine

## 2011-01-30 ENCOUNTER — Telehealth (INDEPENDENT_AMBULATORY_CARE_PROVIDER_SITE_OTHER): Payer: Self-pay | Admitting: *Deleted

## 2011-02-03 NOTE — Progress Notes (Signed)
Summary: refill  Phone Note Refill Request Message from:  Fax from Pharmacy on January 30, 2011 4:10 PM  Refills Requested: Medication #1:  TRILIPIX 135 MG CPDR 1 each am Jocelyn Lamer- fax [phone]  Initial call taken by: Okey Regal Spring,  January 30, 2011 4:10 PM    Prescriptions: TRILIPIX 135 MG CPDR (CHOLINE FENOFIBRATE) 1 each am  #90 x 0   Entered by:   Shonna Chock CMA   Authorized by:   Marga Melnick MD   Signed by:   Shonna Chock CMA on 01/31/2011   Method used:   Printed then faxed to ...       [facility] (mail-order)       [address] [place], [place]  [address]       Ph: [phone]       Fax: [phone]   RxID:   [id number]

## 2011-02-04 ENCOUNTER — Telehealth (INDEPENDENT_AMBULATORY_CARE_PROVIDER_SITE_OTHER): Payer: Self-pay | Admitting: *Deleted

## 2011-02-06 ENCOUNTER — Telehealth (INDEPENDENT_AMBULATORY_CARE_PROVIDER_SITE_OTHER): Payer: Self-pay | Admitting: *Deleted

## 2011-02-10 NOTE — Progress Notes (Signed)
Summary: refill  Phone Note Refill Request Message from:  Fax from Pharmacy on February 06, 2011 10:35 AM  Refills Requested: Medication #1:  PRAVASTATIN SODIUM 20 MG TABS 1 at bedtime**LABS DUE 02/2011** [facility] [address] - fax [phone]  Initial call taken by: Okey Regal Spring,  February 06, 2011 10:36 AM    Prescriptions: PRAVASTATIN SODIUM 20 MG TABS (PRAVASTATIN SODIUM) 1 at bedtime**LABS DUE 02/2011**  #90 x 0   Entered by:   Shonna Chock CMA   Authorized by:   Marga Melnick MD   Signed by:   Shonna Chock CMA on 02/06/2011   Method used:   Electronically to        [facility] (retail)       [phone]       [place], [place]  [address]       Ph: [phone]       Fax: [phone]   RxID:   [id number]

## 2011-02-10 NOTE — Progress Notes (Signed)
Summary: Januvia refill (already ordered in Feb??)  Phone Note Refill Request Call back at Home Phone [phone] Message from:  Patient on February 04, 2011 10:07 AM  Refills Requested: Medication #1:  JANUVIA 100 MG  TABS 1qd [facility]     patient called to order this medication, but there is a phone note for this med on 2/15  Initial call taken by: Jerolyn Shin,  February 04, 2011 10:08 AM  Follow-up for Phone Call        Patient states she has 5 pills in her current bottle then another bottle with #90. Patient informed we will refill med at her pending appointment in 02/2011 based on her lab work that she will have prior Follow-up by: Shonna Chock CMA,  February 04, 2011 11:13 AM

## 2011-02-25 ENCOUNTER — Other Ambulatory Visit: Payer: Self-pay

## 2011-03-04 ENCOUNTER — Ambulatory Visit: Payer: Self-pay | Admitting: Internal Medicine

## 2011-03-06 ENCOUNTER — Other Ambulatory Visit: Payer: Self-pay | Admitting: *Deleted

## 2011-03-06 ENCOUNTER — Encounter: Payer: Self-pay | Admitting: Internal Medicine

## 2011-03-06 DIAGNOSIS — I495 Sick sinus syndrome: Secondary | ICD-10-CM

## 2011-03-06 MED ORDER — LEVOTHYROXINE SODIUM 25 MCG PO TABS: 25.0000 ug | ORAL_TABLET | Freq: Every day | ORAL | Status: DC

## 2011-03-11 ENCOUNTER — Other Ambulatory Visit (INDEPENDENT_AMBULATORY_CARE_PROVIDER_SITE_OTHER): Payer: Medicare Other

## 2011-03-11 DIAGNOSIS — E119 Type 2 diabetes mellitus without complications: Secondary | ICD-10-CM

## 2011-03-11 DIAGNOSIS — E785 Hyperlipidemia, unspecified: Secondary | ICD-10-CM

## 2011-03-11 DIAGNOSIS — N189 Chronic kidney disease, unspecified: Secondary | ICD-10-CM

## 2011-03-11 DIAGNOSIS — T887XXA Unspecified adverse effect of drug or medicament, initial encounter: Secondary | ICD-10-CM

## 2011-03-11 LAB — HEMOGLOBIN A1C: Hgb A1c MFr Bld: 7.4 % — ABNORMAL HIGH (ref 4.6–6.5)

## 2011-03-11 LAB — LIPID PANEL
Cholesterol: 243 mg/dL — ABNORMAL HIGH (ref 0–200)
HDL: 50.7 mg/dL (ref >39.00)
Total CHOL/HDL Ratio: 5
Triglycerides: 424 mg/dL — ABNORMAL HIGH (ref 0.0–149.0)

## 2011-03-11 LAB — HEPATIC FUNCTION PANEL
Bilirubin, Direct: 0.1 mg/dL (ref 0.0–0.3)
Total Bilirubin: 0.3 mg/dL (ref 0.3–1.2)

## 2011-03-18 ENCOUNTER — Ambulatory Visit (INDEPENDENT_AMBULATORY_CARE_PROVIDER_SITE_OTHER): Payer: Medicare Other | Admitting: Internal Medicine

## 2011-03-18 DIAGNOSIS — E119 Type 2 diabetes mellitus without complications: Secondary | ICD-10-CM

## 2011-03-18 DIAGNOSIS — R1032 Left lower quadrant pain: Secondary | ICD-10-CM

## 2011-03-18 DIAGNOSIS — K5900 Constipation, unspecified: Secondary | ICD-10-CM

## 2011-03-18 DIAGNOSIS — E785 Hyperlipidemia, unspecified: Secondary | ICD-10-CM

## 2011-03-18 NOTE — Progress Notes (Signed)
Subjective:    Patient ID: [name], female    DOB: July 10, 1925, 77 y.o.   MRN: [id number]  HPI#1  Dyslipidemia assessment: Lab results  review: LDL 129 on Pravastatin 20 mg, TG 424               Family history of premature CAD/ MI: bro MI in 25s                                                                               Nutrition: her description is  Vague; she maintains she is avoiding HFCS sugars (see TG above). "Maybe I need someone to tell me what to eat"                                                                              Exercise: no   Smoking : quit 1983   HTN: not checked      Weight :no significant  change                 ROS: fatigue: no ; chest pain : no ;claudication: no; palpitations:  no;  myalgias:no;  syncope : no ; memory loss: no;skin changes: warts   #2 Diabetes status assessment :Fasting or morning glucose range:  Up to 189 ; average :  ? 140    ;   Highest glucose 2 hours after any meal:  Not checked ;  Hypoglycemia :  no  .                                          Excess thirst :no;  Excess hunger:  no ;  Excess urination:  no .                                            Lightheadedness with standing:  no ;                                                                                                          Non healing skin  ulcers or sores,especially over the feet:  no ; Numbness or tingling or burning in feet :  In feet.  Vision changes : no                                                                        Medication compliance : yes;  Eye exam : 09/11 ; Foot care : no ;  A1c/ urine microalbumin monitor:  7.4%   #3 Constipation :Onset: 1 month ago   Severity: very severe                                        Better with: CVS stool softener   Worse with: ? Related to New med( pravastatin) Associated  Symptoms: Nausea/Vomiting: no  Diarrhea: no  Melena/BRBPR: no  Hematemesis: no  Anorexia: no  Fever/Chills: no  Past Surgeries: Colon Polypectomy,Colonoscopy due 10/12 , Dr Russella Dar    Review of Systems     Objective:   Physical Exam Gen.: Healthy  in appearance but weight excess. Alert, appropriate and cooperative throughout exam.Slightly vague in reference to history, especially dietary . Neck: No deformities, masses, or tenderness noted. . Thyroid normal. Lungs: Normal respiratory effort; chest expands symmetrically. Lungs are clear to auscultation without rales, wheezes, or increased work of breathing. Heart: Normal rate and rhythm. Normal S1 and S2. No gallop, click, or rub. S4 with slurring; no murmur. Abdomen: Bowel sounds normal; abdomen soft but  Slightly tender LLQ. No masses, organomegaly or hernias noted.  No clubbing, cyanosis.OA finger deformities noted. Lipedema of ankles. Nail health  good. Vascular: Carotid, radial arterypulses are full and equal. No bruits present. Decreased pedal pulses Neurologic: Alert and oriented x3. Deep tendon reflexes symmetrical and normal.         Skin: Intact without suspicious lesions or rashes. Lymph: No cervical, axillary, or inguinal lymphadenopathy present. Psych: Mood and affect are normal. Normally interactive .                                                                                       Assessment & Plan:  #1 diabetes; control is adequate based on age and comorbidities  #2 dyslipidemia; triglycerides are 400 suggest major factor contributions  #3 constipation with slight tenderness on palpation left lower quadrant. R/O active GI process; R/O hypothyroidism #4 PMH of colon polyps Plan: #1 Nutrition consultation  #2 MiraLax every third day as needed(samples given)  #3 stool cards, TSH, CBC and differential.

## 2011-03-18 NOTE — Patient Instructions (Addendum)
.   Please complete stool cards. After this is done , start  MiraLax one packet every third day as needed. Repeat the fasting lipids and A1c in 4 months (250.02) after seeing the Nutritionist

## 2011-03-19 ENCOUNTER — Encounter: Payer: Self-pay | Admitting: Internal Medicine

## 2011-03-19 MED ORDER — POLYETHYLENE GLYCOL 3350 17 G PO PACK: 17.0000 g | PACK | Freq: Every day | ORAL | Status: AC

## 2011-03-30 ENCOUNTER — Other Ambulatory Visit (INDEPENDENT_AMBULATORY_CARE_PROVIDER_SITE_OTHER): Payer: Medicare Other

## 2011-03-30 ENCOUNTER — Telehealth: Payer: Self-pay | Admitting: Internal Medicine

## 2011-03-30 DIAGNOSIS — Z1211 Encounter for screening for malignant neoplasm of colon: Secondary | ICD-10-CM

## 2011-03-30 LAB — HEMOCCULT GUIAC POC 1CARD (OFFICE): Card #3 Fecal Occult Blood, POC: NEGATIVE

## 2011-03-30 NOTE — Telephone Encounter (Signed)
Needs refill for Januvia called into [facility] for 90 day supply plus one refill;   Also needs to let Dr Frederik Pear nurse know that [facility] will not fill prescription for Mirolax

## 2011-03-31 MED ORDER — SITAGLIPTIN PHOSPHATE 100 MG PO TABS: 100.0000 mg | ORAL_TABLET | Freq: Every day | ORAL | Status: DC

## 2011-03-31 NOTE — Telephone Encounter (Signed)
RX sent to the pharmacy, noted about Miralax

## 2011-04-07 NOTE — Assessment & Plan Note (Signed)
[facility]                            CARDIOLOGY OFFICE NOTE   NAME:Porco, ELARIA OSIAS                         MRN:          [id number]  DATE:01/02/2008                            DOB:          10-23-1925    CLINICAL HISTORY:  Mrs.  Horiuchi is 77 years old and has returned for  followup management for coronary heart disease and pacemaker.  She had  coronary bypass surgery in 2001.  She also had a Medtronic DDD pacemaker  implanted for sick sinus syndrome.  She says she done quite well since  that time.  She does have exertional shortness of breath, but this is  stable and has not changed.  She had no chest pain or palpitations.  She  had a Myoview scan 2007, which showed no evidence of ischemia and good  left ventricular function.   PAST MEDICAL HISTORY:  1. Hypertension.  2. Diabetes.  3. Hyperlipidemia.   CURRENT MEDICATIONS:  1. Include glimepiride.  2. Prilosec.  3. Actos.  4. Metformin.  5. Digoxin 0.125 mg daily except for one and a half on Wednesdays.  6. Simvastatin 40 mg daily.  7. Celebrex.  8. Niacin 500 mg daily.  9. Flaxseed oil.  10.Metoprolol 200 mg daily.  11.Enalapril 20 mg daily.  12.Levothyroxine/hydrochlorothiazide 12.5 mg daily.  13.Januvia.  14.Aspirin.  15.Calcium.   PHYSICAL EXAMINATION:  Blood pressure 142/69, pulse 74 and regular.  There is no venous tension.  The carotid pulses were full without  bruits.  CHEST:  Was clear.  CARDIAC:  Rhythm was regular.  I could hear no murmurs or gallops.  ABDOMEN:  Soft, normal bowel sounds.  There is no hepatosplenomegaly.  Peripherals were full. There is nonpitting peripheral edema.   We checked her pacemaker and she had good thresholds on both leads.  She  was atrial pacing almost all the time and ventricular pacing 34% of time  with an AV delay programmed at 320.   IMPRESSION:  1. Coronary artery disease status post coronary bypass graft surgery      2001.  2. Sick sinus  syndrome status post Medtronic DDD pacemaker implanted      with good pacer function not pacer dependent.  3. Hypertension.  4. Diabetes.  5. Hyperlipidemia.   RECOMMENDATIONS:  I think Ms. Morlock is doing quite well.  Her blood  pressure is borderline but she says it has been good on other readings.  She is only on 500 niacin and I encouraged her to take a gram a day.  Will plan to see her back in the year.  She will be 9 years post bypass  surgery at that time and we will plan a rest/stress Myoview scan prior  to that visit.     Bruce Elvera Lennox Juanda Chance, MD, [facility]  Electronically Signed    BRB/MedQ  DD: 01/02/2008  DT: 01/03/2008  Job #: [id number]

## 2011-04-07 NOTE — Op Note (Signed)
[name], [name]                  ACCOUNT NO.:  [id number]   MEDICAL RECORD NO.:  [id number]          PATIENT TYPE:  AMB   LOCATION:  DAY                          FACILITY:  [facility]   PHYSICIAN:  Ronald A. Gioffre, M.D.DATE OF BIRTH:  15-Jul-1925   DATE OF PROCEDURE:  07/04/2008  DATE OF DISCHARGE:                               OPERATIVE REPORT   SURGEON:  Georges Lynch. Darrelyn Hillock, M.D.   ASSISTANT:  Arlyn Leak, P.A.-C.   PREOPERATIVE DIAGNOSIS:  1. Tear of the rotator cuff tendon, left shoulder.  2. Severe impingement syndrome, left shoulder.  3. Frozen shoulder on the left.   PROCEDURE:  Under general anesthesia, we did a closed manipulation of a  frozen shoulder, and a routine orthopedic prepping and draping of the  left upper extremity was carried out.  Prior to taking her back for the  prep, she had an interscalene nerve block on the left in the holding  area.  She was then brought back under general anesthesia in the main OR  at which time we did a sterile prep and drape of the left shoulder.  She  first had 1 gram of IV Ancef.  An incision was then made over the  anterior aspect of the left shoulder.  Bleeders identified and  cauterized.  I then inserted self-retaining retractors.  I then  separated deltoid tendon from the acromion in the usual fashion.  I  split the proximal part of the deltoid muscle.  At this particular time,  I then protected the rotator cuff after I excised the subdeltoid bursa.  We protected the cup with a Bennett retractor, and we then did a partial  acromionectomy and acromioplasty.  We tried to reestablish the  subacromial space which was severely compromised.  At this time after  the acromionectomy acromioplasty was carried out in the usual fashion, I  then noticed a large tear of the rotator cuff anteriorly.  I utilized  the bur to bur down the lateral articular surface of the humerus.  I  then inserted a PEEK anchor.  I had 4 sutures out of the  anchor.  Following that, I used 2 of the sutures to tie the rotator cuff down to  the bone.  Following that, we then applied a TissueMend graft in the  usual fashion to reinforce the rotator cuff tendon that was sutured down  in place in the usual fashion with Ethibond suture.  Thoroughly irrigated out the area.  I then reapproximated deltoid tendon  and muscle in usual fashion.  Subcutaneous was closed with 0 Vicryl,  skin with metal staples.  Sterile Neosporin dressing was applied, and  she was placed in a shoulder immobilizer.           ______________________________  Georges Lynch Darrelyn Hillock, M.D.     RAG/MEDQ  D:  07/04/2008  T:  07/04/2008  Job:  [id number]   cc:   Windy Fast A. Darrelyn Hillock, M.D.  Fax: [phone]   Bruce R. Juanda Chance, MD, [facility]  [address]  [place], [place] [address]   Titus Dubin.  Alwyn Ren, MD,FACP,FCCP  [phone] [place]  [place] [address]

## 2011-04-10 NOTE — Op Note (Signed)
[name], [name]                            ACCOUNT NO.:  [id number]   MEDICAL RECORD NO.:  [id number]                   PATIENT TYPE:  INP   LOCATION:  2899                                 FACILITY:  [facility]   PHYSICIAN:  Georges Lynch. Darrelyn Hillock, M.D.             DATE OF BIRTH:  1925-03-14   DATE OF PROCEDURE:  07/18/2003  DATE OF DISCHARGE:                                 OPERATIVE REPORT   SURGEON:  Georges Lynch. Darrelyn Hillock, M.D.   ASSISTANT:  Ebbie Ridge. Paitsel, P.A.   PREOPERATIVE DIAGNOSIS:  Severe degenerative arthritis of the right knee  with a valgus deformity.   POSTOPERATIVE DIAGNOSIS:  Severe degenerative arthritis of the right knee  with a valgus deformity.   OPERATION:  Right total knee arthroplasty. I cemented all three components.  Vancomycin was used mixed in with the cement.   SIZES USED:  First of all we used an [employer] total knee posterior cruciate  sacrificing type. I utilized a size 9 right posterior cruciate sacrificing  type femoral  component, size 7 tibial tray with a 10 mm thickness insert.  The size of the patella was a size 26 patella. All three components were  cemented.   DESCRIPTION OF PROCEDURE:  Prior to the general anesthesia in the recovery  area, she was first given a femoral nerve block by anesthesia. She was taken  back, given a general anesthetic, sterile prep and draping of the right  lower extremity was carried out. She had 1 g of IV Ancef preop. At this  time, an incision was made over the anterior aspect of the right knee,  bleeders identified and cauterized. Two flaps were created. I then carried  out a median parapatellar incision,  reflected the patella laterally and  then flexed the knee and removed all the spurs and then did medial and  lateral meniscectomies and excised the anterior and posterior cruciate  ligaments. The initial drill hole was made in the intercondylar notch of the  femur. 12 mm thickness was removed from the distal  femur. We utilized a  second jig for a size 9 right femur and we made our anterior, posterior and  chamfering cuts. Following this, we removed 4 mm thickness off the affected  medial side of the tibia. We did utilize the intermedullary guide rods.  Following this, we then prepared our patella. We removed 10 mm thickness off  the articular surface of the patella, three drill holes then were made in  the patella for a size 26 patella. We then flexed the knee and then cut our  keel cut out of the proximal tibial metaphysis for a size 7 tray. We then  thoroughly water picked the knee, dried the knee out and did good soft  tissue releases. We then cemented all three components in simultaneously.  After this, we then removed all loose pieces of cement. We then went  through  trials again and selected a 10 mm thickness insert. We then water picked the  knee and searched for loose pieces of cement again and then dried the knee  out and then inserted our permanent 10 mm thickness tibial insert. We then  reduced the knee, took the knee through flexion extension. He had good  function and good balance medially and laterally. We then inserted a Hemovac  drain and closed the knee in layers over a Hemovac drain. Sterile dressings  were applied. The patient left the operating room in satisfactory condition.                                               Ronald A. Darrelyn Hillock, M.D.   RAG/MEDQ  D:  07/18/2003  T:  07/18/2003  Job:  [id number]

## 2011-04-10 NOTE — Discharge Summary (Signed)
[place]  Patient:    [name], [name]                   MRN: [id number] Adm. Date:  [id number] Disc. Date: [id number] Attending:  Charlett Lango Dictator:   Eugenia Pancoast, P.A. CC:         Salvatore Decent. Dorris Fetch, M.D.             Titus Dubin. Alwyn Ren, M.D. [facility]             Evette Georges, M.D. [facility]             Daisey Must, M.D. [facility]                           Discharge Summary  DATE OF BIRTH:  02/25/1925  FINAL DIAGNOSES: 1. Coronary artery disease. 2. Hypertension. 3. Diabetes mellitus, type 2. 4. Hypothyroid. 5. Hyperlipidemia. 6. Osteoarthritis. 7. Postoperative cardiac dysrhythmia.  PROCEDURES:  March 16, 2000, left heart catheterization per Dr. Loraine Leriche Pulsipher.  March 18, 2000, coronary artery bypass x 5 with the left internal mammary artery to the left anterior descending and saphenous vein graft to the posterior descending artery and saphenous vein graft to the diagonal artery, and a sequential saphenous vein graft to the first and second obtuse marginals.  Mar 25, 2000, pacer implant per Dr. Juanda Chance.  HISTORY OF PRESENT ILLNESS:  This is a 77 year old female who had been well until the afternoon of her admission when she had gone on a long bus ride where she ate too much and got back to [place].  At that time, she got off the bus and developed the sudden onset of severe midsternal chest pain. Patient points to the middle of her chest as the source of her pain.  She says the pain felt like an "explosion."  Patient also noticed some slight diaphoresis and shortness of breath, and some radiation of the pain up into the jaw and down the left arm to the medial side of the elbow.  Pain lasted approximately 30 minutes.  She was able to drive a friend home although she states the pain was a 9 out of a total of 10.  When she got home, she called her daughter.  Her daughter noted her to be profoundly short of breath on  the phone and told her to call EMS.  She went to her mothers house and then came to the emergency room with her.  The patient said she had never had any pain like this before since her hiatal hernia pain but it had been mostly been a burning sensation, not like this before.  She does not exercise on a regular basis because she has severe osteoarthritis.  EMS was called and, as noted above, she was brought to the emergency room.  She was given sublingual nitroglycerin and aspirin x 4 and an EKG was done prior to her getting to the emergency room.  She was seen and evaluated in the emergency room by Dr. Cathren Laine.  Because of her presentation suggestive of coronary artery pain and longstanding hypertension, she was subsequently admitted.  HOSPITAL COURSE:  Patient was admitted to [facility] on March 13, 2000.  At that time, she was seen initially by the emergency room doctor, then she was seen by Dr. Tawanna Cooler with the [facility] group.  He saw the patient and admitted her.  Subsequently, Dr.  Loraine Leriche Pulsipher had been consulted for cardiac workup.  She was hospitalized.  She was started on Integrilin.  She was then scheduled to undergo left heart catheterization which was done on March 16, 2000, by Dr. Loraine Leriche Pulsipher.  She was noted to have significant three-vessel disease at that time.  Because of her symptoms and the findings at catheterization, she was referred to Dr. Charlett Lango for surgical revascularization.  Dr. Dorris Fetch met with the patient and spoke with her and discussed the surgery.  Risks and benefits were explained.  Patient understood and agreed to surgery.  Subsequently, on March 18, 2000, patient underwent coronary artery bypass x 5 with the LIMA to the LAD, saphenous vein graft to the posterior descending artery, saphenous vein graft to diagonal artery, and a sequential saphenous vein graft to the first and second obtuse marginals.  Patient tolerated the  procedure well.  No intraoperative complications occurred.  Prior to patients admission, she was possibly a borderline diabetic but, postoperatively, she was started on Glucotrol for her diabetes.  She did have noted elevated blood sugars.  Postoperatively, she did satisfactorily.  She was extubated on the operative day and she continued to progress in a satisfactory manner.  She did have an episode of brady- tachycardia noted on April 28 and an episode of paroxysmal atrial fibrillation, also.  She was watched through this.  She was worked up because of this and subsequently scheduled to have a pacer implant.  Patient was transferred to the step-down unit on March 22, 2000, her fourth postoperative day.  There, she continued to progress satisfactorily.  No other brady-tachy rhythms were noted at this time.  She did go through her cardiac rehab phase without difficulty.  Medications were adjusted accordingly.  She was, as noted, started on Glucotrol.  She was also started on Zocor.  On Mar 25, 2000, the patient underwent insertion of a Sigma DDD pacemaker.  Patient tolerated the procedure well and no intraoperative complications occurred during that procedure.  She had been on Toprol and this was increased.  Patient was doing quite well.  Incisions were all healing satisfactorily at this time.  She was subsequently prepared for discharge.  DISCHARGE MEDICATIONS: 1. Lanoxin 0.125 mg q.d. 2. Zocor 20 mg q.d. 3. Synthroid 0.1 mg q.d. 4. Glucotrol XL 5 mg q.d. 5. Toprol XL 50 mg q.d. 6. Tylox one or two p.o. q.4-6h. p.r.n. pain. 7. Prilosec 20 mg q.d.  DISCHARGE INSTRUCTIONS:  Patient noted to have new-onset diabetes and, prior to her discharge, she was instructed on diet.  Patient will follow up with Dr. Gerri Spore in two weeks.  She will see Dr. Dorris Fetch in three weeks. Patient has staples in her right leg incision and these will be removed at Dr. Rondel Jumbo office on Thursday, May  10, at 9:00 a.m.   CONDITION ON DISCHARGE:  Patient is subsequently discharged home in satisfactory and stable condition on Mar 26, 2000. DD:  03/25/00 TD:  03/27/00 Job: [id number]

## 2011-04-10 NOTE — H&P (Signed)
[name], [name]                            ACCOUNT NO.:  [id number]   MEDICAL RECORD NO.:  [id number]                   PATIENT TYPE:  AMB   LOCATION:  DSC                                  FACILITY:  [facility]   PHYSICIAN:  Hermelinda Medicus, M.D.                DATE OF BIRTH:  06/26/1925   DATE OF ADMISSION:  06/11/2004  DATE OF DISCHARGE:                                HISTORY & PHYSICAL   This patient is a 77 year old female who has had persistent ear problems,  fluid behind the tympanic membrane.  She has been on antibiotics to resolve  an otitis media and mastoiditis.  She has resolved the infection, but a CT  scan was obtained after she did not resolve the fluid behind the tympanic  membrane, and this showed bilateral mastoid opacification with suspect acute  inflammatory change.  Osseous destruction is not seen, ruling out probable  cholesteatoma.  However, otitis media and serous otitis continue.  Her  hearing is down by approximately 30 decibels.  She has a flat impedance  study and bilateral myringotomy and tubes, I feel, would be an excellent way  to try to ventilate the middle ear and try to improve the mastoid status.   Her past history is pertinent in the fact that she has had a pacemaker.  She  has had coronary artery disease with previous bypass surgery in 2001.  She  has had the pacer implanted because of sick sinus syndrome.  She has had  status post DDD pacemaker.  She also has hypertension and diabetes, and her  chest x-ray showed some bronchitis, nodular opacity left base, probable  callus, rib fracture.  Her latest stress test was quite excellent in the  fact that she had a 2-dimensional transthoracic echocardiogram with M-Mode  and Doppler performed.  The study was performed and was technically adequate  and with normal IVC.  The patient has been doing well medically.  She has  felt well.   MEDICATIONS:  1. Digitek 0.125 mg.  2. Zocor 40 mg.  3. Glipizide 5 mg  daily.  4. Toprol XL 200 mg daily.  5. Protonix 40 mg daily.  6. Synthroid 0.25 mg daily.  7. Altace 10 mg daily.  8. A baby aspirin once a day.  9. Clarinex 5 mg daily.  10.      Hydrochlorothiazide 25 mg daily.  11.      Actos 30 mg daily.  12.      Niacin 500 mg daily.   She does not smoke or drink.  She had a knee replaced in August 2004.  The  coronary artery bypass graft was in April 2001.  She had a tonsillectomy and  a hysterectomy multiple years ago.   PHYSICAL EXAMINATION:  VITAL SIGNS:  Blood pressure of 134/64.  Her CBG was  122.  Weight 195.  She is 5 feet  5 inches.  Her heart rate is 74.  HEENT:  The ears show very dark tympanic membranes with the above-mentioned  hearing loss and obvious fluid behind the tympanic membranes, however no  erythema at this time.  The nose is clear, oral cavity is clear.  The  larynx, the true cords, false cords, epiglottis, base of tongue are clear of  ulceration or mass.  True cord mobility, gag reflex, tongue mobility, EOMs,  facial nerve are all symmetrical.  NECK:  Free of any thyromegaly, cervical adenopathy, or mass.  CHEST:  Clear.  No rales, rhonchi, or wheezes.  Decreased breath sounds.  CARDIOVASCULAR:  Pacemaker in place.  No opening snaps, murmurs, or gallops.  EXTREMITIES:  Unremarkable except for the knee replacement.  ABDOMEN:  Essentially mildly obese but no liver, spleen, or kidneys  palpable.   INITIAL DIAGNOSES:  1. Bilateral serous otitis, otitis media with mastoiditis.  2. History of pacemaker.  3. History of coronary artery disease and elevation of blood pressure.  4. Diabetes.  5. Post DDD pacemaker for sick sinus syndrome and paroxysmal atrial     fibrillation.                                                Hermelinda Medicus, M.D.    JC/MEDQ  D:  06/11/2004  T:  06/11/2004  Job:  [id number]   cc:   Titus Dubin. Alwyn Ren, M.D. [facility]   Carole Binning, M.D. [facility]

## 2011-04-10 NOTE — Assessment & Plan Note (Signed)
[facility]                            CARDIOLOGY OFFICE NOTE   NAME:[name]                         MRN:          [id number]  DATE:12/02/2006                            DOB:          06-30-1925    PRIMARY CARE PHYSICIAN:  Dr. Alwyn Ren.   CLINICAL HISTORY:  [name] is 77 years old and has documented coronary  artery disease with coronary artery bypass surgery in 2001.  She also  has a Medtronic DDD pacemaker implanted for a sick sinus syndrome.  She  has done quite well.  She does have shortness of breath with exertion,  which is chronic, but she has had no chest pain or palpitations.  We did  an adenosine rest stress Myoview scan prior to this visit, which showed  no evidence of ischemia.  Her ejection fraction was 56%.   PAST MEDICAL HISTORY:  1. Hypertension.  2. Diabetes.  3. Hyperlipidemia.   CURRENT MEDICATIONS:  Zocor, aspirin, Altace, Synthroid,  hydrochlorothiazide, Protonix, Toprol, Actos, Digitek, metformin and  glimepiride, both of which has just been started, Niacin, Celebrex and  calcium.   PHYSICAL EXAMINATION:  The blood pressure is 136/78 and the pulse 89 and  regular.  There is no venous distention.  The carotid pulses were full without  bruits.  CHEST:  Clear.  CARDIAC:  Rhythm was regular.  I could here no murmurs or gallops.  ABDOMEN:  Soft with normal bowel sounds.  There was no  hepatosplenomegaly.  There was non-pitting edema in the lower extremities.  The pedal pulses  were equal.   We interrogated her pacemaker today and she was pacing in both chambers  most all of the time with a short AV delay.  We increased her AV delay  to allow intrinsic conduction.  We also cut back on her output to the  right ventricle.  She has a rather modest rate response, but in view of  her age, this is probably appropriate.  She had good threshold in both  channels.   IMPRESSION:  1. Coronary artery disease, status post  coronary artery bypass graft      surgery in 2001, now stable.  2. Sick sinus syndrome, status post Medtronic DDD pacemaker      implantation, now with good pacer function.  3. Hypertension.  4. Diabetes.  5. Hyperlipidemia.   I think [name] is doing well.  Dr. Alwyn Ren has just seen her and is  adjusting her medications with regards to her sugar and her cholesterol.  She does have a history of atrial fibrillation and she is not on  Coumadin and I will query this.  Otherwise, we will see her back in a  year.   ADDENDUM:  On interrogation of the pacemaker, the mode switch was off,  so we cannot tell often she had mode switching.  We have turned the mode  switching on this time and will reassess her on her next visit to  readdress the issue of whether she should be on Coumadin.     Bruce Elvera Lennox Juanda Chance, MD, [facility]  Electronically Signed    BRB/MedQ  DD: 12/02/2006  DT: 12/03/2006  Job #: [id number]

## 2011-04-10 NOTE — Procedures (Signed)
[place]  Patient:    [name], [name]                   MRN: [id number] Proc. Date: 03/25/00 Adm. Date:  [id number] Disc. Date: [id number] Attending:  Charlett Lango CC:         Titus Dubin. Alwyn Ren, M.D. [facility]             Steven C. Dorris Fetch, M.D.             Daisey Must, M.D. [facility]             Cardiopulmonary Laboratory                           Procedure Report  PROCEDURE:  Implantation of a Medtronic Sigma DDDR generator model #SDR303-B, serial [id number] with a Medtronic bipolar screw-in ventricular lead, model #576, 52.0 cm, serial #[id number], and a Medtronic capture fix Nova atrial lead, model #5076, 45.0 cm, serial #[id number].  CARDIOLOGISTEverardo Beals Juanda Chance, M.D.  ANESTHESIA:  Local Xylocaine 1%.  INDICATIONS:  Ms. Laprade is 77 years old and recently was admitted with a small non-Q-wave infarction and was found to have three-vessel disease at cardiac catheterization by Dr. Daisey Must, and underwent bypass surgery by Dr. Salvatore Decent. Hendrickson.  She had marked periods of bradycardia and tachycardia with paroxysmal atrial fibrillation postoperatively, and had a history of this preoperatively.  The decision was made to put in a pacemaker.  It was originally scheduled yesterday for Dr. Carolanne Grumbling, but had to be postponed due to a busy schedule.  ESTIMATED BLOOD LOSS:  Less than 20 cc.  COMPLICATIONS:  None.  DESCRIPTION OF PROCEDURE:  The procedure was performed in laboratory room #3. he right anterior chest was prepped and draped in the usual fashion.  A venogram was done prior to the pacemaker insertion.  The skin and subcutaneous tissue were anesthetized with 1% local Xylocaine.  Using a #18 gauge thin-walled needle the  subclavian vein was entered and access was secured with the #0.038 wire.  An incision was made below the clavicle and extended to the prepectoral fascia.  A  pocket was made inferior to the  incision using blunt dissection.  Using two 9-French sheaths the atrial and ventricular leads were passed to the right atrium. The #0.038 wire was left in the subclavian vein for later access.  A figure-of-eight suture was placed in the entry site for hemostasis and for later securing the leads.  The ventricular lead was positioned in the right ventricular apex, with good pacing parameters as described below.  The atrial lead was screwed into the anterior atrial wall, with good pacing parameters described below. After the removal of the stylettes, and the #0.038 wire, the figure-of-eight suture was secured at the entry site.  The leads were attached to the posterior aspect of he pocket with two sutures of #1-0 silk around each Silastic collar.  The pocket was irrigated with sterile kanamycin solution.  The leads were attached to the generator with a hex nut wrench, and ________ supine into the pocket and secured loosely to the prepectoral fascia with #1-0 silk.  The subcutaneous tissue was closed with running #2-0 Dexon.  The skin was closed with running #5-0 Dexon.  PACING PARAMETERS: Ventricular bipolar:  R-wave 8.8 mV, minimum threshold capture 0.5 V, resistance 824 ohms. Ventricular unipolar:  R-wave 3.6 mV, minimum threshold capture 0.3 V, resistance 731 ohms.  Atrial bipolar:  P-wave 7.3 mV, minimum threshold capture 0.8 V, resistance 479  ohms. Atrial unipolar:  P-wave 6.9 mV, minimum threshold capture 0.4 V, resistance 410 ohms.  There was no pacing of the diaphragm in either the unipolar or the bipolar mode at 10 volts on either lead.  The patient tolerated the procedure well and left the laboratory overriding her  pacemaker in sinus rhythm. DD:  03/25/00 TD:  03/27/00 Job: [id number]

## 2011-04-10 NOTE — H&P (Signed)
NAME:  [name], [name]                            ACCOUNT NO.:  [id number]   MEDICAL RECORD NO.:  [id number]                   PATIENT TYPE:  INP   LOCATION:  NA                                   FACILITY:  [facility]   PHYSICIAN:  Georges Lynch. Gioffre, M.D.             DATE OF BIRTH:  08-Sep-1925   DATE OF ADMISSION:  DATE OF DISCHARGE:                                HISTORY & PHYSICAL   HISTORY:  The patient has had right knee pain for several years.  She has  had known degenerative arthritis in her right knee.  She has had numerous  injections of cortisone offering temporary relief.  She is taking Celebrex  200 mg twice daily, but she is no longer getting relief of her symptoms.  Her symptoms have progressed to the point that she often requires a cane for  ambulation.  She has elected to proceed with a right total hip arthroplasty.   ALLERGIES:  CODEINE causes a rash and itching.   PRIMARY CARE PHYSICIAN:  Titus Dubin. Alwyn Ren, M.D. [facility]   PAST MEDICAL HISTORY:  1. Hypertension.  2. Coronary artery disease.  3. History of myocardial infarction in 2001.  4. Hiatal hernia.  5. Gastroesophageal reflux disease.  6. History of frequent urinary tract infection.  7. Hypothyriodism.  8. Degenerative arthritis.  9. Noninsulin dependent diabetes mellitus.   CURRENT MEDICATIONS:  1. Altace 10 mg daily.  2. Synthroid 25 micrograms daily.  3. Clarinex 5 mg daily.  4. HCTZ 25 mg daily.  5. Meclizine one-half tablet q.8h. p.r.n. dizziness.  6. Metoclopramide 5 mg q.6h. p.r.n.  7. Glipizide XL 5 mg daily.  8. Actos 30 mg daily.  9. Lanoxin 0.125 mg daily.  10.      Toprol XL, 200 mg daily.  11.      Zocor 40 mg daily.  12.      Protonix 40 mg daily.   FAMILY HISTORY:  Mother as well as four brothers and one sister have heart  disease.  Mother and four brothers and sister have hypertension.  Sister has  diabetes.  Mother had colon and breast cancer.  Brother had prostate and  colon cancer.   Family history of arthritis both parents, brothers and one  sister.   REVIEW OF SYSTEMS:  GENERAL:  Denies weight change, fever, chills or  fatigue.  HEENT:  Denies headache, visual changes, tinnitus, hearing loss or  sore throat.  CARDIOVASCULAR:  Denies chest pain, palpitations, shortness of  breath or orthopnea.  PULMONARY:  Denies dyspnea, wheezing, cough or sputum  production, hemoptysis.  GI:  Denies dysphagia, nausea, vomiting,  hematemesis, or abdominal pain.  GU:  Denies dysuria, frequency, urgency or  hematuria.  ENDOCRINE:  Denies polyuria, polydipsia, appetite change, heat  or cold intolerance.  MUSCULOSKELETAL:  The patient has bilateral hip pain,  right worse than the left.  NEUROLOGIC:  Denies  dizziness, vertigo, syncope,  or seizures.  SKIN:  Denies itching, rashes, masses or moles.   PHYSICAL EXAMINATION:  VITAL SIGNS:  Temperature 97.8, pulse 64,  respirations 18, blood pressure 110/70, right arm sitting.  GENERAL:  A 77 year old female, in no acute distress.  HEENT:  PERRL, EOM'S intact.  Pharynx clear.  TM's are intact.  NECK:  Supple without masses.  CHEST:  Clear to auscultation bilaterally.  No wheezing, rales or rhonchi  noted.  HEART:  Regular rate and rhythm without murmur, gallop or rub.  ABDOMEN:  Positive bowel sounds, soft, nontender, no organomegaly or  abnormal masses.  EXTREMITIES:  Examination of her right knee reveals decreased range of  motion, very painful range of motion.  She has valgus deformity.  SKIN:  Warm and dry.   X-ray of her right knee shows severe, degenerative arthritis of the lateral  joint and valgus deformity.   IMPRESSION:  Degenerative arthritis right knee.   PLAN:  The patient is to be admitted to [facility] on July 18, 2003, to undergo a right total knee arthroplasty.      Ebbie Ridge. Paitsel, P.A.                     Ronald A. Darrelyn Hillock, M.D.    Tilden Dome  D:  07/12/2003  T:  07/13/2003  Job:  [id number]

## 2011-04-10 NOTE — Cardiovascular Report (Signed)
. [facility]  Patient:    [name], [name]                   MRN: [id number] Proc. Date: 03/16/00 Adm. Date:  [id number] Attending:  Evette Georges CC:         Cardiac Catheterization Laboratory             Titus Dubin. Alwyn Ren, M.D. [facility]                        Cardiac Catheterization  PROCEDURE PERFORMED:  Left heart catheterization with coronary angiography, left ventriculography, and an abdominal aortography.  CARDIOLOGIST:  Daisey Must, M.D.  INDICATIONS:  Ms. Gillham is a 77 year old woman who was admitted two days ago with chest pain.  She had positive troponins, and evolved anterolateral and inferior  T-wave inversions on her electrocardiogram.  DESCRIPTION OF PROCEDURE:  A 6-French sheath was placed in the right femoral artery.  The standard Judkins 6-French catheters were utilized.  Contrast was Omnipaque.  There were no complications.  RESULTS: HEMODYNAMICS: Left ventricular pressure:  156/16. Aortic pressure:  146/66.  There is no aortic valve gradient.  LEFT VENTRICULOGRAM:  There is a small focal area of akinesis of the inferior wall with hypokinesis of the inferior apical wall.  The ejection fraction is estimated at 55%-60%.  No mitral regurgitation.  ABDOMINAL AORTOGRAM:  Reveals a 25% stenosis in the left renal artery.  There is mild plaque at the distal aortic bifurcation.  The iliac arteries are free of significant disease.  CORONARY ARTERIOGRAPHY (RIGHT DOMINANT): 1. Left main coronary artery:  The left main coronary artery is normal. 2. Left anterior descending coronary artery:  The LAD has an ostial 30%-40%    stenosis.  In the midvessel just after the bifurcation of a large first    diagonal branch is a discrete 90% stenosis.  Following this is a diffuse    70% stenosis.  Further down in the midvessel is a 50%, followed by an 80%    stenosis.  There is a large first diagonal branch with a 70% stenosis at  its origin.  A small second diagonal. 3. Left circumflex coronary artery:  The left circumflex has a 40% stenosis in    the midvessel, and 40% stenosis further down in the midvessel just before    the origin of a large OM-I.  There is a small ramus intermedius.  The OM-I    is large and has a 60% stenosis at its origin, and a 75% stenosis in the body.    There is a small OM-II which has a diffuse subtotal occlusion with TIMI-2    flow.  The OM-III is a small vessel. 4. Right coronary artery:  Is diffusely ectatic.  The proximal vessel has a    30% stenosis.  There is a 90% stenosis in the midvessel, followed by a    30% stenosis further down in the midvessel.  In the distal vessel is a    90% stenosis.  There is a normal-sized posterior descending coronary artery    with a proximal 30% stenosis, followed by a 99% stenosis.  There are two    small posterolateral branches.  IMPRESSION: 1. Normal to mildly decreased left ventricular systolic function. 2. Significant three-vessel coronary artery disease as described.  PLAN:  Cardiovascular surgery will be consulted.  The patient needs aggressive isk factor reduction. DD:  03/16/00 TD:  03/15/00  Job: [id number]

## 2011-04-10 NOTE — Op Note (Signed)
[name], [name]                            ACCOUNT NO.:  [id number]   MEDICAL RECORD NO.:  [id number]                   PATIENT TYPE:  AMB   LOCATION:  DSC                                  FACILITY:  [facility]   PHYSICIAN:  Hermelinda Medicus, M.D.                DATE OF BIRTH:  18-Apr-1925   DATE OF PROCEDURE:  06/11/2004  DATE OF DISCHARGE:                                 OPERATIVE REPORT   PREOPERATIVE DIAGNOSIS:  Bilateral serous otitis and otitis media with  mastoiditis as seen on CAT scan.   POSTOPERATIVE DIAGNOSIS:  Bilateral serous otitis and otitis media with  mastoiditis as seen on CAT scan.   OPERATION:  Bilateral myringotomy and tubes.   SURGEON:  Hermelinda Medicus, M.D.   ANESTHESIA:  Local MAC standby.   ANESTHESIOLOGIST:  Janetta Hora. Gelene Mink, M.D.   PROCEDURE:  Patient was placed in supine position under local and was  prepped and draped in the usual manner.  Betadine was used for prep.  Xylocaine 1% with epinephrine in the 4 quadrants of external auditory canal  on each ear canal on each side was used for our local and then myringotomy  was carried out in each ear on the anteroinferior aspect of the tympanic  membrane.  Fluid was suctioned from beneath both tympanic membranes, which  was thick and glue-like, but did not appear to be infected.  Type 1  Paparella PE tubes were placed on both sides.  The Pediotic drops were  placed postoperatively.  The patient tolerated the procedure well and is  doing well postop.   DISCHARGE INSTRUCTIONS:  Followup will be in 2 weeks.  She is to keep her  ears dry and to use the Pediotic drops, 1-2 drops in each ear once a day x5.   FOLLOWUP:  Her followup will be in 2 weeks, then in 6 weeks, 3 months and 6  months.                                               Hermelinda Medicus, M.D.    JC/MEDQ  D:  06/11/2004  T:  06/11/2004  Job:  [id number]   cc:   Carole Binning, M.D. [facility]   Titus Dubin. Alwyn Ren, M.D. [facility]

## 2011-04-10 NOTE — Op Note (Signed)
[place]  Patient:    [name], [name]                   MRN: [id number] Proc. Date: 03/18/00 Adm. Date:  [id number] Attending:  Charlett Lango Dictator:   Salvatore Decent. Dorris Fetch, M.D. CC:         Daisey Must, M.D. [facility]             Titus Dubin. Alwyn Ren, M.D. [facility]                           Operative Report  PREOPERATIVE DIAGNOSIS:  Three vessel coronary disease, status post non-Q-wave myocardial infarction.  POSTOPERATIVE DIAGNOSIS:  Three vessel coronary disease, status post non-Q-wave myocardial infarction.  PROCEDURE: 1. Median sternotomy. 2. Coronary artery bypass grafting x 5 (LIMA to left anterior descending    artery, saphenous vein graft to first diagonal, saphenous vein graft to    obtuse marginal #1 and 2 sequentially, saphenous vein graft to posterior    descending).  SURGEON:  Dr. Charlett Lango.  ASSISTANT:  Lissa Merlin.  ANESTHESIA:  General.  FINDINGS:  The left anterior descending artery, obtuse marginal #1, PDA good targets, diagonal fair target, obtuse marginal #2 poor target, PL too small to graft.  LIMA good, vein fair.  Normal left ventricle.  HISTORY OF PRESENT ILLNESS:  [name] is a 77 year old female with no previous cardiac history who presented with acute substernal prolonged chest pain.  She ruled in for a non-Q-wave myocardial infarction.  She underwent cardiac catheterization which revealed severe three vessel coronary disease.  Her ejection fraction was 55 to 60%.  The patient was referred for coronary artery bypass grafting.  The indications, risks, benefits, and alternative treatments were discussed in detail with the patient and are outlined in the patients chart.  She understood the risks, accepted them, and agreed to proceed.  DESCRIPTION OF PROCEDURE:  [name] was taken to the preoperative holding area on 03/18/00.  Lines were placed to monitor arterial central venous and pulmonary arterial  pressure.  EKG leads were placed for continuous telemetry. The patient was taken to the operating room, anesthetized, and intubated.  A Foley catheter was placed, intravenous antibiotics were administered.  The chest, abdomen, and legs were prepped and draped in the usual fashion.  A median sternotomy was performed, and the left internal mammary artery was harvested in standard fashion.  The left internal mammary artery was a good 2 mm conduit.  Simultaneous to this the greater saphenous vein was harvested from the right leg.  The greater saphenous vein was of fair quality.  There was good quality at the lower leg, and then became very small just below the knee, and then was slightly enlarged with mild varicosities in the thigh.  The patient was fully heparinized prior to dividing the distal end of the mammary artery.  There was good flow to the cut end of the vessel.  The mammary was placed in a ______ soaked sponge and placed into the left pleural space.  The pericardium was opened.  The ascending aorta was palpated.  There was no palpable atherosclerotic disease.  The aorta was cannulated via concentric 2-0 Ethibond pledgettes and pursestring sutures.  The dual stage venous CAM was placed with a pursestring suture in the right atrial appendage. Cardiopulmonary bypass was instituted, and the patient was cooled to 32 degrees Celcius.  The coronary arteries were inspected, and anastomotic  sites were chosen.  The conduits were inspected and cut to length.  A foam pad was placed in the pericardium to protect the left phrenic nerve, and a temperature probe was placed in the myocardial septum.  A cardioplegia cannula was placed in the ascending aorta.  The aorta was cross clamped, the left ventricle was emptied via aortic root. Cardiac arrest then was achieved with a combination of cold antegrade blood cardioplegia and topical ice saline. Five-hundred cc of cardioplegia was administered.   Myocardial surface temperature was 9 degrees Celcius.  The following distal anastomoses were then performed.  First, a reverse saphenous vein graft was placed end-to-side to the posterior descending branch of the right coronary artery, the posterior descending was a 1.5 mm vessel, good quality at the site of the anastomosis.  There was a very tight lesion in the proximal portion of the vessel.  There was a posterolateral branch distal to the PVA which was too small to graft.  The end-to-side anastomosis was performed with a running 7-0 Prolene suture and was probed proximally and distally with a 1.5 mm probe to ensure patency before tying the suture.  The graft flushed well.  Additional Cardioplegia was administered down the graft, and there was good hemostasis.  Next, a reverse saphenous graft was placed end-to-side to the first diagonal branch at the left anterior descending artery.  The first diagonal was a 1.5 mm fair quality diffusely diseased vessel.  The vein graft to the diagonal was the smallest of the vein grafts, but was of good quality.  It was anastomosed end-to-side using a running 7-0 Prolene suture.  Again, the anastomosis was probed proximally and distally.  The probe passed easily, and there was good flow through the graft with flushing.  Cardioplegia was administered and there was good hemostasis.  Next, a reverse saphenous vein graft was placed sequentially to the first and second obtuse marginal branches of the left circumflex coronary artery.  The first obtuse marginal branch was a 1.5 mm good quality vessel.  A side-to-side anastomosis was performed off a side branch of the vein graft using a running 7-0 Prolene suture.  This vein graft was the best quality, was a good quality vein.  The distal end of the vein graft then was anastomosed end-to-side to the second obtuse marginal.  This was a small vessel, it was totally occluded proximally.  It was approximately 1  mm in diameter, it was diffusely diseased, and poor quality.  This anastomosis was also performed with a running 7-0  Prolene suture.  Additional cardioplegia then was administered down the vein grafts.  Next, the left internal mammary artery was brought through a window in the anterior portion of the pericardium.  The distal end was spatulated, and it was anastomosed end-to-side to the distal left anterior descending artery. The left anterior descending artery was a 1.8 mm vessel at the site of the anastomosis.  The mammary was a 2 mm vessel.  Both vessels were of good quality.  The anastomosis was performed with a running 8-0 Prolene suture.  At the completion of the mammary to left anterior descending artery anastomosis, the bullet clamp was removed from the mammary artery.  Immediate and rapid septal rewarming was noted.  Lidocaine was administered.  The mammary pedicle was taken to the epicardial surface of the heart with 6-0 Prolene sutures. The aortic cross clamp was removed.  Total cross clamp time was 56 minutes. Two defibrillations with 20 Joules were required.  The patient then resumed sinus rhythm.  A partial occlusion clamp was placed on the ascending aorta.  The Cardioplegia cannula was removed.  The proximal vein graft anastomoses were performed to 4.4 mm punch aortotomies with running 6-0 Prolene sutures.  The proximal anastomoses of the OM and PDA grafts were placed to the aorta.  Because the diagonal graft was significantly smaller and would not sit well on a 4.4 mm punch, the vein was cut short where it was larger in caliber, and anastomosed to the site of the obtuse marginal sequential graft.  This anastomosis was performed with a running 7-0 Prolene suture.  Air was aspirated from all grafts before restoring flow to the coronary circulation.  Epicardial pacing wires were placed on the right ventricle and right atrium.  The patient was rewarmed.  The patient was  weaned from cardiopulmonary bypass when the core temperature reached 37 degrees Celcius.  The patient weaned from bypass without difficulty.  Total bypass time was 129 minutes.  A test dose of Protamine was administered.  The atrial and aortic cannulas were removed.  There was good hemostasis at both cannulation sites.  The remainder of the Protamine was administered without incident.  The chest was irrigated with 1 L of warm normal saline containing 1 g of vancomycin. Hemostasis was achieved.  The pericardium was reapproximated over the base of the heart and ascending aorta with interrupted 3-0 silk sutures.  A left pleural and two mediastinal chest tubes were placed through separate subcostal incisions.  The sternum was closed with interrupted stainless steel wires. The pectoralis fascia was closed with a running #1 Vicryl suture. Subcutaneous tissue was closed with a running 2-0 Vicryl suture, and the skin was closed with a 3-0 Vicryl subcuticular suture.  The leg incision was closed in two layers with a 2-0 Vicryl subcutaneous suture and skin staples.  All sponge, needle, and instrument counts were correct at the end of the procedure.  There were no intraoperative complications.  The patient was taken from the operating table to the surgical intensive care unit intubated in stable condition.DD:  03/18/00 TD:  03/19/00 Job: [id number]

## 2011-04-10 NOTE — H&P (Signed)
[place]  Patient:    [name], [name]                   MRN: [id number] Adm. Date:  03/13/00 Attending:  Evette Georges, M.D. [facility] CC:         Titus Dubin. Alwyn Ren, M.D. [facility]                         History and Physical  TIME:  11:30 p.m.  PRIMARY CARE PHYSICIAN:  Dr. Marga Melnick.  HISTORY OF PRESENT ILLNESS:  This is one of many [facility] admissions for this 77 year old, widowed, white female who comes into the emergency room via the EMS for evaluation of chest pain.  Patient says she was well until this afternoon after she had gone on a long bus ride where she ate too much, got back to [place], got off the bus and developed the sudden onset of severe midsternal chest pain.  She points to the middle of her chest as the source of her pain.  She says the pain felt like an "explosion."  She also noticed some slight diaphoresis, some shortness of breath, and some radiation of pain up into the jaw and down the left arm to the medial side of the elbow.  This pain lasted about 30 minutes.  She was able to drive a friend home although she states the pain was a 9.  When she got home she called her daughter.  Her daughter noted her to be profoundly short of breath on the phone and told her to call EMS, went to her mothers house, and then came to the emergency room with her.  Patient says she has never had any pain like this before since her hiatal hernia pain but it has mostly been a burning sensation not like this before.  She does not exercise on a regular basis because she has severe OA so it is difficult to tell if she has had previous exercise-induced chest pain.  EMS was called as noted above. She was given sublingual nitroglycerin, ASA x 4, EKG was done, and she was brought to the emergency room.  She was seen and evaluated in the emergency room by Dr. Cathren Laine.  At that time, he felt she was clinically stable but was  ______ because of the type of pain she was having.  He therefore called me and I came to the hospital for evaluation.  Because the above history is suggestive of coronary pain and she has longstanding hypertension x 15 years, and she is an occult diabetic with a blood sugar of 180+, and because she has a positive family history of coronary disease it was felt necessary to admit patient to the hospital for evaluation.  PAST MEDICAL HISTORY:  Hospitalized: T&A, appendectomy, childbirth x 1, TAH/BSO for endometriosis, and cholecystitis.  Past illnesses: None. Injuries: None.  She has had hypertension for 15 years.  DRUG ALLERGIES:  CODEINE gives her a rash.  No airway edema.  CURRENT MEDICATIONS: 1. Celebrex 200 b.i.d. 2. Synthroid 0.1 q.d. 3. Norvasc 10 q.d. 4. Capoten 25 b.i.d. 5. Prilosec 20 q.d. 6. Claritin 10 q.d. 7. Baby aspirin one q.d.  HABITS:  She does not smoke or drink any alcohol.  As noted above, she does not exercise.  REVIEW OF SYSTEMS:  Her weight has been steady.  She says it is "too high." She has a floater in the  left eye, recently saw Dr. Nile Riggs, she said he said that was of no concern, and her glaucoma screening was negative.  She has never had any history of cardiopulmonary disease.  She had one bout of asthma when she had a bad viral infection.  She has had no history of GI problems except for the hiatal hernia.  She has been seen and evaluated by Dr. Claudette Head in 1999.  At that time, she had colon polyps removed.  She also had upper endoscopy and was told she was H. pylori positive and was treated for that.  She was asymptomatic GI-wise at the time.  She has had no history of any kidney or urinary tract problems.  Musculoskeletal problems: She has had severe OA.  She is followed by Dr. Darrelyn Hillock and had an injection in her right knee yesterday.  She has also seen Dr. Vicki Mallet for evaluation of OA.  SOCIAL HISTORY:  She is originally from [place], moved to [place] when she was 76 years old, born and raised here, had one daughter, husband died, she is a widow, and she lives alone.  FAMILY HISTORY:  Dad died of stomach problems.  She is not much sure about him because he left home.  Mother died in 36 of colon cancer.  She also had a history of cardiac arrhythmias and hypertension.  One sister died in 40 of breast cancer.  Five brothers - three died of coronary disease, two were smokers and had hypertension, the other one just had hypertension, one died of malaria, the other one is alive but he has had colon cancer.  PHYSICAL EXAMINATION:  VITALS:  Temperature 98, pulse 70 and regular, respirations 12 and regular, BP 148/62.  GENERAL:  She is a well-developed, well-nourished, slightly obese, white female in no acute distress.  HEENT:  Negative.  NECK:  Supple, thyroid not enlarged, no carotid bruits.  CHEST:  Clear to auscultation.  CARDIAC:  Negative.  ABDOMEN:  Negative, no bruits.  EXTREMITIES:  Normal skin, normal peripheral pulses, normal except for 1+ pedal edema.  She says she gets this when she sits on the bus and goes for long bus rides like she did today.  LABORATORY DATA:  Normal CBC and electrolytes except for sodium 130.  Blood sugar was 183.  LFTs were normal.  Protime was normal with an INR of 1.1 and PTT was normal.  Platelet count was normal.  CPK total of 68 with an MB fraction of 3.6, troponin 1 was negative at 0.05.  EKG was within normal limits as was the chest x-ray although it does appear to my eye to be some perihilar congestions probably secondary to the fact that it is a portable film.  IMPRESSION:  1. Chest pain rule out MI.  2. Hypertension.  3. Occult glucose intolerance.  4. Osteoarthritis - severe.  5. Hypothyroidism.  6. GERD.  7. History of allergic rhinitis.  8. Status post TAH and BSO.  9. Status post childbirth x 1. 10. Status post cholecystectomy. 11. Status  post appendectomy. 12. Status post T&A.   PLAN:  Admit, cardiac monitor, monitor blood sugar, start Glucotrol 5 XL q.d. because of elevated blood sugar.  We will also start her on heparin drip and nitroglycerin.  We will get serial enzymes, EKG, start her on a beta blocker 25 mg of atenolol q.a.m., and get a cardiac consult. DD:  03/14/00 TD:  03/13/00 Job: [id number]

## 2011-04-10 NOTE — Discharge Summary (Signed)
[place]  Patient:    [name], [name]                         MRN: [id number] Adm. Date:  [id number] Disc. Date: [id number] Attending:  Daisey Must Dictator:   Joellyn Rued, P.A.-C. CC:         Titus Dubin. Alwyn Ren, M.D. [facility]   Referring Physician Discharge [place]  DATE OF BIRTH:  [birth year]  SUMMARY OF HISTORY:  [name] is a 77 year old white female with a history of coronary artery disease, non-Q-wave myocardial infarction in May 2001.  She underwent five-vessel bypass surgery with a LIMA to the LAD, saphenous vein graft to the PDA, saphenous vein graft to the diagonal, saphenous vein graft to the OM-1 and OM-2.  She had a recent Cardiolite in January 2002 which was negative.  However, she again presents with lower substernal chest discomfort radiating to her right side and under the right scapula.  She denies any associated symptoms.  She had an episode approximately two days ago that lasted two to three hours.  Her discomfort decreased with activity.  Her history is also notable for diabetes, hypertension, permanent pacemaker post CABG secondary to bradycardia, hyperlipidemia, GERD, osteoarthritis.  LABORATORY DATA:  CKs and troponins were negative for myocardial infarction. Admission sodium was 141, potassium 4.3, BUN 24, creatinine 0.6, glucose 133. PT 13.6, PTT 26.  H&H 12.1 and 35.3, normal indices, platelets 172, wbcs 4.3.  EKG showed normal sinus rhythm, nonspecific ST-T wave changes, no acute abnormalities, as well as atrial pacing.  HOSPITAL COURSE:  [name] was admitted to the unit 2000.  Overnight, she did not have any further complaints.  Enzymes and EKGs were negative for myocardial infarction.  Stress Cardiolite was performed on March 24.  However, due to her pacemaker this was changed to an adenosine Cardiolite; adenosine Cardiolite was performed.  Imaging showed an EF of 62%.  She did not have any signs of ischemia or  scarring, thus it was felt that she could be discharged home.  DISCHARGE DIAGNOSES: 1. Chest discomfort of unknown etiology. 2. History as previously described.  DISPOSITION:  She is discharged home.  NOTE:  The discharge instruction sheet is not on her chart currently in medical records. DD:  02/28/01 TD:  02/28/01 Job: [id number]

## 2011-04-10 NOTE — Discharge Summary (Signed)
NAMEAMARE, [name]                            ACCOUNT NO.:  [id number]   MEDICAL RECORD NO.:  [id number]                   PATIENT TYPE:  INP   LOCATION:  4146                                 FACILITY:  [facility]   PHYSICIAN:  Ranelle Oyster, M.D.             DATE OF BIRTH:  April 28, 1925   DATE OF ADMISSION:  07/22/2003  DATE OF DISCHARGE:  07/28/2003                                 DISCHARGE SUMMARY   DISCHARGE DIAGNOSES:  1. Right total knee replacement.  2. Hypokalemia, resolved.  3. Hypertension.   HISTORY OF PRESENT ILLNESS:  [name] is a 77 year old female who has a  history of diabetes, hypertension, severe degenerative arthritis in the  right knee with difficulty in ambulation.  She elected to undergo right  total knee replacement July 18, 2003, by Dr. Darrelyn Hillock.  Postoperatively,  she was weightbearing as tolerated and on Coumadin for DVT prophylaxis.  She  was noted to have some sedation initially secondary to meds.  She did  require treatment with packed red blood cells for postoperative anemia.  Physical therapy has been ongoing, and currently the patient is moderate  assist for transfers, minimal assist for ambulation with walker.   PAST MEDICAL HISTORY:  1. Hypertension.  2. Coronary artery disease.  3. Status post coronary artery bypass graft with permanent pacemaker in     place.  4. Occasional urinary tract infections.  5. Hypothyroidism.  6. Occasional dizziness.  7. Diabetes mellitus, type 2.  8. Gastroparesis.  9. Osteoarthritis.  10.      Hysterectomy.  11.      Cholecystectomy.  12.      Appendectomy.  13.      Tonsillectomy.  14.      Dyslipidemia.  15.      Question of labyrinthitis.   ALLERGIES:  Codeine.   SOCIAL HISTORY:  The patient lives alone in a home with one step at entry.  She was independent with assist device prior to admission.  She does not use  any tobacco or alcohol.  The family can provide some supervision.   HOSPITAL COURSE:   [name] was admitted to rehabilitation on July 22, 2003, for inpatient therapy.  She received PT/TO daily.  Post admission,  she was maintained on Coumadin for DVT prophylaxis.  She was monitored on  a.c. and q.h.s. CBG checks and has shown reasonable control from 109 to  occasional high 150s.  The p.o. intake has been good.  The patient's  pressures initially were noted to be on the low side and hydrochlorothiazide  was held.  She was also noted to be hypokalemic with potassium at 2.9.  This  was supplemented and follow up labs of July 25, 2003, showed her  hyponatremia and hypokalemia to be much improved with sodium of 134,  potassium 4.0, chloride 96, CO2 22, BUN 16, creatinine 0.8, glucose 120.  Followup on postoperative anemia showed H&H at 10.2 and 29.3.  A urinalysis  and urine culture were done which were negative.  The patient's right hip  incision healed well without any signs or symptoms of infection; no drainage  or erythema were noted.  Sutures remained intact, and the patient is to  follow up with Dr. Darrelyn Hillock three to four days past discharge for staple  removal.  As the patient's blood pressure starting climbing upwards with  increase in mobility, hydrochlorothiazide was resumed.  At the time of  discharge, the patient was modified independent level of transfer, modified  independent level for ambulating 150 feet with a rolling walker.  Knee  flexion was at 79 degrees.  The patient was moderate independent for ADLs  including toileting as well as advanced ADLs.  Further followup therapies  are to include home health PT, OT through [facility] with  home R.N. arranged to draw pro times and [facility] to adjust  Coumadin.  On July 28, 2003, the patient is discharged to home.   DISCHARGE MEDICATIONS:  1. Glucotrol XL 5 mg daily.  2. Levothyroxine 25 mg per day.  3. Protonix 40 mg a day.  4. Ancef 10 mg every day.  5. Zocor 40 mg  q.h.s.  6. Trinsicon one p.o. b.i.d.  7. Coumadin 2 mg 1/2 tablet q.p.m.  8. Lanoxin 0.125 mg every day  9. Toprol XL 200 mg every day.  10.      Actos 30 mg every day.  11.      Oxycodone IR 50-10 mg q.4-6h. p.r.n. pain.  12.      Hydrochlorothiazide 25 mg every day.   ACTIVITY:  Walker for ambulation.  CPM use for at least 3-4 hours daily.   DIET:  Diabetic diet.  Check blood sugars on a b.i.d. basis.  Keep wound  area clean and dry.   SPECIAL INSTRUCTIONS:  No alcohol, no smoking, no driving.  No aspirin or  aspirin containing products while on Coumadin.  [facility] to check  pro time by R.N. with next pro time drawn July 31, 2003.  Coumadin to be  continued through August 18, 2003.    FOLLOW UP:  The patient is to follow up with Dr. Darrelyn Hillock and Dr. Alwyn Ren for  postoperative check and follow up with Dr. Riley Kill as needed.      Greg Cutter, P.A.                    Ranelle Oyster, M.D.    PP/MEDQ  D:  08/14/2003  T:  08/15/2003  Job:  [id number]   cc:   Windy Fast A. Darrelyn Hillock, M.D.  [address]  [place]  [place] [address]  Fax: [phone]   Titus Dubin. Alwyn Ren, M.D. [facility]

## 2011-04-10 NOTE — Discharge Summary (Signed)
NAME:  [name], [name]                            ACCOUNT NO.:  [id number]   MEDICAL RECORD NO.:  [id number]                   PATIENT TYPE:  INP   LOCATION:  5006                                 FACILITY:  [facility]   PHYSICIAN:  Georges Lynch. Darrelyn Hillock, M.D.             DATE OF BIRTH:  [birth year]   DATE OF ADMISSION:  07/18/2003  DATE OF DISCHARGE:  07/22/2003                                 DISCHARGE SUMMARY   ADMISSION DIAGNOSES:  1. Degenerative arthritis right knee.  2. Hypertension.  3. Coronary artery disease.  4. History of myocardial infarction in 2001.  5. Hiatal hernia.  6. Gastroesophageal reflux disease.  7. History of recurrent urinary tract infections.  8. Hypothyroidism.  9. Non-insulin-dependent diabetes.   DISCHARGE DIAGNOSES:  1. Degenerative arthritis right knee status post right total knee     arthroplasty.  2. Hypertension.  3. Coronary artery disease.  4. History of myocardial infarction in 2001.  5. Hiatal hernia.  6. Gastroesophageal reflux disease.  7. History of recurrent urinary tract infections.  8. Hypothyroidism.  9. Non-insulin-dependent diabetes.   PROCEDURE:  The patient was taken to the operating room July 18, 2003 to  undergo a right total knee arthroplasty.  Surgeon Georges Lynch. Darrelyn Hillock, M.D.  Assistant Ebbie Ridge. Paitsel, P.A.  Surgery was performed under general  anesthesia.  Hemovac drain was placed at the time of surgery.   CONSULTS:  Physical therapy, occupational therapy, rehabilitation.   BRIEF HISTORY:  This patient is a 77 year old female who has had right knee  pain for the past several years.  She has had known degenerative arthritis  in her right knee.  She has had numerous injections of cortisone offering  temporary relief.  She is currently taking Celebrex 200 mg b.i.d. but she is  no longer getting relief of her symptoms.  Her symptoms had progressed to  the point that she often requires a cane for ambulation.  She has elected to  proceed with a right total knee arthroplasty.   LABORATORY DATA:  Pre admission CBC:  WBC 5.4, RBC 3.92, hemoglobin 12.1,  hematocrit 34.7, platelet count 153,000.  Pre admission PT 13.6, INR 1.1,  PTT 30.  Pre admission chemistry:  Sodium 139, potassium 4.3, chloride 104,  CO2 28, glucose 92, BUN 23, creatinine 0.9, calcium 10.  Pre admission  urinalysis is negative.  The patient's blood type is A-.  Negative antibody  screen.  The patient's hemoglobin and hematocrit were followed throughout  her hospitalization.  She had a drop in her hemoglobin on July 21, 2003.  On postoperative day three hemoglobin was 7.4.  She was transfused 2 units  of packed red cells and her hemoglobin stabilized 9.2.  Pre admission x-ray  of right knee showed severe degenerative arthritis.  Postoperative x-ray of  right knee revealed good position of right total knee prosthesis.  Pre  admission chest x-ray:  No acute process seen.  Pre admission EKG:  Normal  sinus rhythm at a rate of 73.   HOSPITAL COURSE:  The patient was admitted to [facility] and taken  to the operating room.  She underwent the above stated procedure without  complication.  The patient tolerated procedure well.  Was allowed to return  to the recovery room and then to the orthopedic floor to continue her  postoperative care.  Hemovac drain was placed at the time of surgery.  It  was pulled on postoperative day one.  She was placed on PC analgesia for  pain control following surgery.  The PCA was kept until postoperative day  three when she was weaned over to oral analgesics.  Her hemoglobin and  hematocrit were followed closely throughout her hospitalization.  She did  have a drop in her hemoglobin postoperative day three which required blood  transfusion.  Her hemoglobin stabilized following the transfusion.  The  patient was slow to progress and was ambulating a few feet by postoperative  day four and it was felt that she could  benefit from an inpatient  rehabilitation stay and she was transferred to the rehabilitation unit on  postoperative day four.   DISPOSITION:  The patient was transferred to rehabilitation on July 22, 2003, postoperative day four.   TRANSFER MEDICATIONS:  1. Coumadin 5 mg one daily.  2. Trinsicon one tablet t.i.d.  3. Altace 2 mg daily.  4. Synthroid 25 mcg daily.  5. Clarinex 5 mg daily.  6. HCTZ 25 mg daily.  7. Meclizine 25 mg one-half tablet q.8h. p.r.n. dizziness.  8. Metoclopramide 5 mg q.6h. p.r.n.  9. Glipizide XL 5 mg daily.  10.      Actos 30 mg daily.  11.      Lanoxin 0.125 mg daily.  12.      Toprol XL 200 mg daily.  13.      Zocor 40 mg daily.  14.      Protonix 40 mg daily.  15.      Percocet 10/650 one to two q.6h. as needed for pain.  16.      Robaxin 500 mg one q.6h. as needed for muscle spasm.   DIET:  As tolerated.   ACTIVITY:  Total knee precautions.  Full weightbearing with walker.   FOLLOWUP:  The patient is to follow up with Windy Fast A. Gioffre, M.D. two  weeks from the date of surgery.   CONDITION ON TRANSFER:  Stable.      Ebbie Ridge. Paitsel, P.A.                     Ronald A. Darrelyn Hillock, M.D.    Tilden Dome  D:  08/06/2003  T:  08/06/2003  Job:  [id number]

## 2011-04-22 ENCOUNTER — Encounter: Payer: Medicare Other | Attending: Internal Medicine | Admitting: Dietician

## 2011-04-22 DIAGNOSIS — E119 Type 2 diabetes mellitus without complications: Secondary | ICD-10-CM | POA: Insufficient documentation

## 2011-04-22 DIAGNOSIS — Z713 Dietary counseling and surveillance: Secondary | ICD-10-CM | POA: Insufficient documentation

## 2011-04-29 ENCOUNTER — Other Ambulatory Visit: Payer: Self-pay

## 2011-04-29 MED ORDER — RANITIDINE HCL 150 MG PO TABS: 150.0000 mg | ORAL_TABLET | Freq: Two times a day (BID) | ORAL | Status: DC

## 2011-04-29 MED ORDER — CHOLINE FENOFIBRATE 135 MG PO CPDR: 135.0000 mg | DELAYED_RELEASE_CAPSULE | Freq: Every day | ORAL | Status: DC

## 2011-06-05 ENCOUNTER — Encounter: Payer: Self-pay | Admitting: Internal Medicine

## 2011-06-05 DIAGNOSIS — I495 Sick sinus syndrome: Secondary | ICD-10-CM

## 2011-06-11 ENCOUNTER — Encounter: Payer: Self-pay | Admitting: Internal Medicine

## 2011-06-15 ENCOUNTER — Encounter: Payer: Self-pay | Admitting: *Deleted

## 2011-06-15 ENCOUNTER — Ambulatory Visit (INDEPENDENT_AMBULATORY_CARE_PROVIDER_SITE_OTHER): Payer: Medicare Other | Admitting: Internal Medicine

## 2011-06-15 ENCOUNTER — Encounter: Payer: Self-pay | Admitting: Internal Medicine

## 2011-06-15 DIAGNOSIS — Z950 Presence of cardiac pacemaker: Secondary | ICD-10-CM

## 2011-06-15 DIAGNOSIS — I495 Sick sinus syndrome: Secondary | ICD-10-CM

## 2011-06-15 DIAGNOSIS — I10 Essential (primary) hypertension: Secondary | ICD-10-CM

## 2011-06-15 DIAGNOSIS — I2510 Atherosclerotic heart disease of native coronary artery without angina pectoris: Secondary | ICD-10-CM

## 2011-06-15 LAB — PACEMAKER DEVICE OBSERVATION
AL THRESHOLD: 1 V
ATRIAL PACING PM: 95.5
BAMS-0001: 150 {beats}/min
RV LEAD AMPLITUDE: 11.2 mv
RV LEAD IMPEDENCE PM: 993 Ohm
RV LEAD THRESHOLD: 1 V

## 2011-06-15 NOTE — Assessment & Plan Note (Signed)
No ischemic symptoms No changes today 

## 2011-06-15 NOTE — Progress Notes (Signed)
The patient presents today for routine electrophysiology followup.  Since last being seen in our clinic, the patient reports doing very well.  Her primary concern is with elevated blood sugars.  She continues to work with her PCP regarding this issue.  Today, she denies symptoms of palpitations, chest pain, shortness of breath, lower extremity edema (above baseline), dizziness, presyncope, or syncope.  The patient feels that she is tolerating medications without difficulties and is otherwise without complaint today.   Past Medical History  Diagnosis Date  . Sliding hiatal hernia   . Erosive esophagitis   . Peptic stricture of esophagus   . Diverticulosis   . Arthritis   . Hypothyroidism   . Diabetes mellitus     Type I  . Coronary artery disease     s/p CABG 2001  . Sick sinus syndrome     s/p Medtronic DDD pacemaker implanted  . Hypertension   . Hyperlipidemia   . GERD (gastroesophageal reflux disease)    Past Surgical History  Procedure Date  . Pacemaker placement 03/25/2000    by Dr Juanda Chance  . Tonsillectomy and adenoidectomy   . Appendectomy   . Cholecystectomy   . Total abdominal hysterectomy w/ bilateral salpingoophorectomy     Endometiosis  . Replacement total knee 2004    Right  . Colonoscopy w/ polypectomy 2003  . Cataract extraction 05/2005  . Coronary artery bypass graft 02/2000    5 vessel    Current Outpatient Prescriptions  Medication Sig Dispense Refill  . Calcium Carbonate (CALCIUM 500 PO) Take 500 mg by mouth once.        . Choline Fenofibrate (TRILIPIX) 135 MG capsule Take 1 capsule (135 mg total) by mouth daily.  90 capsule  3  . clopidogrel (PLAVIX) 75 MG tablet Take 75 mg by mouth daily.        . digoxin (LANOXIN) 0.125 MG tablet Take 125 mcg by mouth. One daily except 1 1/2 tab every Wed       . enalapril (VASOTEC) 20 MG tablet Take 20 mg by mouth daily.        . furosemide (LASIX) 20 MG tablet Take 20 mg by mouth daily. Once daily as needed for extreme  swelling       . glimepiride (AMARYL) 2 MG tablet Take 1 mg by mouth as directed.       . hydrochlorothiazide 25 MG tablet Take 25 mg by mouth daily.        Marland Kitchen levothyroxine (SYNTHROID, LEVOTHROID) 25 MCG tablet Take 1 tablet (25 mcg total) by mouth daily. One tab daily except 1 1/2 on Wed  90 tablet  0  . metoprolol (TOPROL-XL) 200 MG 24 hr tablet Take 200 mg by mouth daily.        . niacin 500 MG tablet Take 500 mg by mouth daily with breakfast.        . nitroGLYCERIN (NITROSTAT) 0.3 MG SL tablet Place 0.3 mg under the tongue every 5 (five) minutes as needed.        . Omega-3 Fatty Acids (FISH OIL) 1000 MG CAPS Take 1,000 mg by mouth daily.        . pravastatin (PRAVACHOL) 20 MG tablet Take 20 mg by mouth daily.        . ranitidine (ZANTAC) 150 MG tablet Take 1 tablet (150 mg total) by mouth 2 (two) times daily.  180 tablet  3  . sitaGLIPtan (JANUVIA) 100 MG tablet Take 1 tablet (100 mg  total) by mouth daily.  90 tablet  3    Allergies  Allergen Reactions  . Codeine     History   Social History  . Marital Status: Widowed    Spouse Name: N/A    Number of Children: N/A  . Years of Education: N/A   Occupational History  . Not on file.   Social History Main Topics  . Smoking status: Former Games developer  . Smokeless tobacco: Never Used   Comment: quit 28+ yrs ago (as of 2012)  . Alcohol Use: No  . Drug Use: No  . Sexually Active: Not on file   Other Topics Concern  . Not on file   Social History Narrative   Lives with her grandson and his wife.Daily caffeine use.    Family History  Problem Relation Age of Onset  . Colon cancer Mother 28  . Colon cancer Brother 44  . Breast cancer Mother   . Breast cancer Sister   . Prostate cancer Brother    Physical Exam: Filed Vitals:   06/15/11 0900  BP: 148/77  Pulse: 83  Height: 5\' 5"  (1.651 m)  Weight: 188 lb (85.276 kg)    GEN- The patient is overweight appearing, alert and oriented x 3 today.   Head- normocephalic,  atraumatic Eyes-  Sclera clear, conjunctiva pink Ears- hearing intact Oropharynx- clear Neck- supple, no JVP Lymph- no cervical lymphadenopathy Lungs- Clear to ausculation bilaterally, normal work of breathing Chest- R sided pacemaker pocket is well healed Heart- Regular rate and rhythm,  GI- soft, NT, ND, + BS Extremities- no clubbing, cyanosis, trace edema MS- no significant deformity or atrophy Skin- no rash or lesion  Pacemaker interrogation- reviewed in detail today,  See PACEART report  Assessment and Plan:

## 2011-06-15 NOTE — Patient Instructions (Signed)
Your physician recommends that you schedule a follow-up appointment in: 6 MONTHS WITH [facility] PAULA  [facility] MONTHLY  Your physician recommends that you continue on your current medications as directed. Please refer to the Current Medication list given to you today.

## 2011-06-15 NOTE — Assessment & Plan Note (Signed)
Normal pacemaker function See Arita Miss Art report No changes today  She is approaching ERI (estimated longevity 9 months).  We will therefore have her perform monthly TTMs.  She will return to device clinic in 6 months.

## 2011-06-15 NOTE — Assessment & Plan Note (Signed)
Above goal today She will follow BP closely at home and alert our office if remains elevated

## 2011-06-16 ENCOUNTER — Other Ambulatory Visit: Payer: Self-pay | Admitting: Internal Medicine

## 2011-06-17 ENCOUNTER — Other Ambulatory Visit (INDEPENDENT_AMBULATORY_CARE_PROVIDER_SITE_OTHER): Payer: Medicare Other

## 2011-06-17 LAB — LIPID PANEL
Cholesterol: 226 mg/dL — ABNORMAL HIGH (ref 0–200)
HDL: 48.8 mg/dL (ref >39.00)
Triglycerides: 382 mg/dL — ABNORMAL HIGH (ref 0.0–149.0)

## 2011-06-17 LAB — HEMOGLOBIN A1C: Hgb A1c MFr Bld: 8.8 % — ABNORMAL HIGH (ref 4.6–6.5)

## 2011-06-17 NOTE — Progress Notes (Signed)
Labs only

## 2011-06-18 NOTE — Progress Notes (Signed)
Labs only

## 2011-06-30 ENCOUNTER — Encounter: Payer: Self-pay | Admitting: Internal Medicine

## 2011-06-30 ENCOUNTER — Ambulatory Visit (INDEPENDENT_AMBULATORY_CARE_PROVIDER_SITE_OTHER): Payer: Medicare Other | Admitting: Internal Medicine

## 2011-06-30 DIAGNOSIS — E785 Hyperlipidemia, unspecified: Secondary | ICD-10-CM

## 2011-06-30 DIAGNOSIS — E119 Type 2 diabetes mellitus without complications: Secondary | ICD-10-CM

## 2011-06-30 DIAGNOSIS — I10 Essential (primary) hypertension: Secondary | ICD-10-CM

## 2011-06-30 MED ORDER — LINAGLIPTIN-METFORMIN HCL 2.5-500 MG PO TABS: 2.5000 mg | ORAL_TABLET | ORAL | Status: DC

## 2011-06-30 NOTE — Patient Instructions (Signed)
Do not take Januvia  or metformin. Take the Jentadueto 2.5/500 mg pills 1 daily. Check labs in 6 weeks: BUN , creatinine, K+, A1c.(250.02). Eat a low-fat diet with lots of fruits and vegetables, up to 7-9 servings per day. Avoid obesity; your goal is waist measurement < 40 inches.Consume less than 40 grams of sugar per day from foods & drinks with High Fructose Corn Sugar as #1,2,3 or # 4 on label. Follow the low carb nutrition program in [employer] as closely as possible to prevent Diabetes progression & complications. White carbohydrates (potatoes, rice, bread, and pasta) have a high spike of sugar and a high load of sugar. For example a  baked potato has a cup of sugar and a  french fry  2 teaspoons of sugar. Yams, wild  rice, whole grained bread &  wheat pasta have been much lower spike and load of  sugar. Portions should be the size of a deck of cards or your palm.

## 2011-06-30 NOTE — Progress Notes (Signed)
  Subjective:    Patient ID: [name], female    DOB: [birth year], 77 y.o.   MRN: [id number]  HPI  #1 HYPERTENSION: Disease Monitoring: Blood pressure range-148/77 @ Cardiology appt for pacer recheck; no change in meds  Chest pain, palpitations- no       Dyspnea- DOE with fast walking Medications: Compliance- yes  Lightheadedness,Syncope- no    Edema- yes but stable  #2 DIABETES: Disease Monitoring: Blood Sugar ranges-up to 200 off Metformin  & decreased Glimiperide dose   Polyuria/phagia/dipsia- no       Visual problems- no Medications: Compliance- yes  Hypoglycemic symptoms- no A1c 8.8%; creatinine 1.4 (4/12)   #3 HYPERLIPIDEMIA: Disease Monitoring: See symptoms for Hypertension Medications: Compliance- yes  Abd pain, bowel changes- no   Muscle aches- no LDL 110.8  ROS See HPI above   PMH Smoking Status noted : quit 1983       Review of Systems     Objective:   Physical Exam she is healthy appearing and well-nourished. She appears younger than her stated age.  Her chest is clear with no walls or rhonchi  She has S4 gallop with a grade 1/2-1 systolic murmur  She has no organomegaly or masses. There is no aortic aneurysm or bruits.  All pulses are intact and without deficit.  Lip edema is present at the ankles.  She is alert and oriented x3.  She has no she has no significant skin lesions. Nail health is excellent.          Assessment & Plan:   #1 diabetes; suboptimal control  #2 renal insufficiency, mild, improved off metformin  #3 hypertension well controlled  #4 dyslipidemia, LDL improved and almost at goal. Major dietary issue is elevated triglycerides.

## 2011-07-06 ENCOUNTER — Ambulatory Visit: Payer: Medicare Other | Admitting: Dietician

## 2011-07-18 ENCOUNTER — Other Ambulatory Visit: Payer: Self-pay | Admitting: Internal Medicine

## 2011-08-05 ENCOUNTER — Other Ambulatory Visit: Payer: Self-pay | Admitting: Internal Medicine

## 2011-08-10 ENCOUNTER — Other Ambulatory Visit: Payer: Self-pay | Admitting: Internal Medicine

## 2011-08-11 ENCOUNTER — Other Ambulatory Visit (INDEPENDENT_AMBULATORY_CARE_PROVIDER_SITE_OTHER): Payer: Medicare Other

## 2011-08-11 LAB — HEMOGLOBIN A1C: Hgb A1c MFr Bld: 8.6 % — ABNORMAL HIGH (ref 4.6–6.5)

## 2011-08-21 LAB — DIFFERENTIAL
Eosinophils Absolute: 0.1
Lymphs Abs: 2.3
Neutro Abs: 3
Neutrophils Relative %: 50

## 2011-08-21 LAB — COMPREHENSIVE METABOLIC PANEL
AST: 33
Albumin: 4
Calcium: 10.3
Chloride: 105
Creatinine, Ser: 1.19
GFR calc Af Amer: 53 — ABNORMAL LOW
Total Protein: 6.7

## 2011-08-21 LAB — APTT: aPTT: 29

## 2011-08-21 LAB — URINALYSIS, ROUTINE W REFLEX MICROSCOPIC
Hgb urine dipstick: NEGATIVE
Protein, ur: NEGATIVE
Urobilinogen, UA: 0.2

## 2011-08-21 LAB — URINE CULTURE: Colony Count: 10000

## 2011-08-21 LAB — GLUCOSE, CAPILLARY
Glucose-Capillary: 125 — ABNORMAL HIGH
Glucose-Capillary: 91

## 2011-08-21 LAB — PROTIME-INR
INR: 1
Prothrombin Time: 13.7

## 2011-08-21 LAB — CBC
MCHC: 33.9
MCV: 90.5
Platelets: 173
RDW: 13.2
WBC: 6

## 2011-08-21 LAB — TYPE AND SCREEN: Antibody Screen: NEGATIVE

## 2011-08-24 LAB — GLUCOSE, CAPILLARY
Glucose-Capillary: 107 — ABNORMAL HIGH
Glucose-Capillary: 127 — ABNORMAL HIGH

## 2011-09-01 ENCOUNTER — Encounter: Payer: Self-pay | Admitting: Internal Medicine

## 2011-09-01 ENCOUNTER — Ambulatory Visit (INDEPENDENT_AMBULATORY_CARE_PROVIDER_SITE_OTHER): Payer: Medicare Other | Admitting: Internal Medicine

## 2011-09-01 VITALS — BP 130/78 | HR 93 | Temp 97.8°F | Wt 189.0 lb

## 2011-09-01 NOTE — Patient Instructions (Signed)
Eat a low-fat diet with lots of fruits and vegetables, up to 7-9 servings per day. Avoid obesity; your goal is waist measurement < 40 inches.Consume less than 40 grams of sugar per day from foods & drinks with High Fructose Corn Sugar as #1,2,3 or # 4 on label. Follow the low carb nutrition program in [employer] as closely as possible to prevent Diabetes progression & complications. White carbohydrates (potatoes, rice, bread, and pasta) have a high spike of sugar and a high load of sugar. For example a  baked potato has a cup of sugar and a  french fry  2 teaspoons of sugar. Yams, wild  rice, whole grained bread &  wheat pasta have been much lower spike and load of  sugar. Portions should be the size of a deck of cards or your palm.  

## 2011-09-01 NOTE — Progress Notes (Signed)
Subjective:    Patient ID: [name], female    DOB: 1925/10/02, 77 y.o.   MRN: [id number]  HPI Serial labs were reviewed. The A1c over the period 2/11 to present has ranged from a low of 6.3 and a high of 8.8. Presently the A1c is 8.6. Over the last 3 years creatinine has ranged from 1.19 in 8/09 to  1.8 in 2/12. Over the last 5 months the  Range is  1.4-1.5. Diabetes status assessment: Fasting or morning glucose range:  180- 240. Highest glucose 2 hours after any meal:  Not checked. Hypoglycemia :  "Never" .                                                     Excess thirst ; hunger;urination:  no.                                  Lightheadedness with standing:  no. Chest pain:  no ; Palpitations :no ;  Pain in  calves with walking:  no .                                                                                                                                 Non healing skin  ulcers or sores,especially over the feet:  no. Numbness or tingling or burning in feet : intermittently .                                                                                                                                              Significant change in  Weight : stable. Vision changes : appt 10/12  .                                                                    Nutrition/diet: to start  Weight Watchers'. Medication compliance :  ran out of Jentadueto last week. Medication adverse  Effects:  no .   Review of Systems     Objective:   Physical Exam  She is alert and oriented and appears well-nourished.  Chest is clear with no increased work of breathing  She is an S4 with slurring.  Pedal pulses are decreased by lymphedema.          Assessment & Plan:  #1 uncontrolled diabetes. Reasonable A1c goal is 8%. Present therapies increase risk of progressive renal insufficiency (metformin) or hypoglycemia (Glimiperide). The latter may be ineffective if she is insulin  insufficient.  Plan: I recommended long-acting insulin adjusting the dose to bring the fasting blood sugars in the range of 150-200. A second opinion will be obtained from Dr. Everardo All, endocrinologist.

## 2011-09-03 DIAGNOSIS — I495 Sick sinus syndrome: Secondary | ICD-10-CM

## 2011-09-04 ENCOUNTER — Encounter: Payer: Self-pay | Admitting: Endocrinology

## 2011-09-04 ENCOUNTER — Encounter: Payer: Self-pay | Admitting: Internal Medicine

## 2011-09-04 ENCOUNTER — Ambulatory Visit (INDEPENDENT_AMBULATORY_CARE_PROVIDER_SITE_OTHER): Payer: Medicare Other | Admitting: Endocrinology

## 2011-09-04 DIAGNOSIS — E119 Type 2 diabetes mellitus without complications: Secondary | ICD-10-CM

## 2011-09-04 DIAGNOSIS — M79609 Pain in unspecified limb: Secondary | ICD-10-CM

## 2011-09-04 DIAGNOSIS — N259 Disorder resulting from impaired renal tubular function, unspecified: Secondary | ICD-10-CM

## 2011-09-04 DIAGNOSIS — Z8673 Personal history of transient ischemic attack (TIA), and cerebral infarction without residual deficits: Secondary | ICD-10-CM

## 2011-09-04 DIAGNOSIS — I2510 Atherosclerotic heart disease of native coronary artery without angina pectoris: Secondary | ICD-10-CM

## 2011-09-04 NOTE — Patient Instructions (Addendum)
good diet and exercise habits significanly improve the control of your diabetes.  please let me know if you wish to be referred to a dietician.  high blood sugar is very risky to your health.  you should see an eye doctor every year. controlling your blood pressure and cholesterol drastically reduces the damage diabetes does to your body.  this also applies to quitting smoking.  please discuss these with your doctor.  you should take an aspirin every day, unless you have been advised by a doctor not to. check your blood sugar 1 time a day.  vary the time of day when you check, between before the 3 meals, and at bedtime.  also check if you have symptoms of your blood sugar being too high or too low.  please keep a record of the readings and bring it to your next appointment here.  please call us sooner if you are having low blood sugar episodes, or if it stays over 200. we will need to take this complex situation in stages For now, add januvia 1/2 of 100 mg each morning.  Here are some samples. Our goal should be to keep the blood sugar good, while minimizing or eliminating the glimepiride. Please come back for a follow-up appointment for 1 month.  Please make an appointment.  There are several other medications we can add later.

## 2011-09-04 NOTE — Progress Notes (Signed)
Subjective:    Patient ID: [name], female    DOB: 1925-01-23, 77 y.o.   MRN: [id number]  HPI Pt was widowed many years ago.  She is here with her dtr today.  pt states 11 years h/o dm. it is complicated by cad and tia's.  she has never been on insulin.  pt says his diet is good, but and exercise is limited by med probs. Symptomatically, pt states few years of slight "stinging" of the legs, but no assoc numbness.   she brings a record of her cbg's which i have reviewed today.  It is approx 200 Past Medical History  Diagnosis Date  . Sliding hiatal hernia   . Erosive esophagitis   . Peptic stricture of esophagus   . Diverticulosis   . Arthritis   . Hypothyroidism   . Diabetes mellitus     Type I  . Coronary artery disease     s/p CABG 2001  . Sick sinus syndrome     s/p Medtronic DDD pacemaker implanted  . Hypertension   . Hyperlipidemia   . GERD (gastroesophageal reflux disease)     Past Surgical History  Procedure Date  . Pacemaker placement 03/25/2000    by Dr Juanda Chance  . Tonsillectomy and adenoidectomy   . Appendectomy   . Cholecystectomy   . Total abdominal hysterectomy w/ bilateral salpingoophorectomy     Endometiosis  . Replacement total knee 2004    Right  . Colonoscopy w/ polypectomy 2003  . Cataract extraction 05/2005  . Coronary artery bypass graft 02/2000    5 vessel    History   Social History  . Marital Status: Widowed    Spouse Name: N/A    Number of Children: N/A  . Years of Education: N/A   Occupational History  . Not on file.   Social History Main Topics  . Smoking status: Former Games developer  . Smokeless tobacco: Never Used   Comment: quit 28+ yrs ago (as of 2012)  . Alcohol Use: No  . Drug Use: No  . Sexually Active: Not on file   Other Topics Concern  . Not on file   Social History Narrative   Lives with her grandson and his wife.Daily caffeine use.    Current Outpatient Prescriptions on File Prior to Visit  Medication Sig  Dispense Refill  . Calcium Carbonate (CALCIUM 500 PO) Take 500 mg by mouth once.        . Choline Fenofibrate (TRILIPIX) 135 MG capsule Take 1 capsule (135 mg total) by mouth daily.  90 capsule  3  . clopidogrel (PLAVIX) 75 MG tablet Take 75 mg by mouth daily.        . digoxin (LANOXIN) 0.125 MG tablet Take 125 mcg by mouth. One daily except 1 1/2 tab every Wed       . enalapril (VASOTEC) 20 MG tablet Take 20 mg by mouth daily.        Marland Kitchen glimepiride (AMARYL) 2 MG tablet Take 1 mg by mouth as directed. 1/2 by mouth daily      . hydrochlorothiazide 25 MG tablet Take 25 mg by mouth daily.        . metoprolol (TOPROL-XL) 200 MG 24 hr tablet Take 200 mg by mouth daily.        . niacin 500 MG tablet Take 500 mg by mouth daily with breakfast.        . nitroGLYCERIN (NITROSTAT) 0.3 MG SL tablet Place 0.3 mg under  the tongue every 5 (five) minutes as needed.        . Omega-3 Fatty Acids (FISH OIL) 1000 MG CAPS Take 1,000 mg by mouth daily.        . pravastatin (PRAVACHOL) 20 MG tablet TAKE ONE TABLET BY MOUTH AT BEDTIME **LABS DUE 02/2011**  90 tablet  3  . ranitidine (ZANTAC) 150 MG tablet Take 1 tablet (150 mg total) by mouth 2 (two) times daily.  180 tablet  3  . SYNTHROID 25 MCG tablet TAKE 1 TABLET DAILY EXCEPT TAKE 1 AND 1/2 TABLETS ON  WEDNESDAY. COMMENTS: DUE   FOR PHYSICAL AND LABS  90 tablet  0    Allergies  Allergen Reactions  . Codeine     Rash     Family History  Problem Relation Age of Onset  . Colon cancer Mother 65  . Breast cancer Mother   . Cancer Mother 14    colon,breast  . Colon cancer Brother 64  . Cancer Brother 56    colon  . Breast cancer Sister   . Cancer Sister     breast  . Prostate cancer Brother   . Cancer Brother     prostate    BP 132/78  Pulse 84  Temp(Src) 97 F (36.1 C) (Oral)  Ht 5\' 6"  (1.676 m)  Wt 190 lb 4 oz (86.297 kg)  BMI 30.71 kg/m2  SpO2 96%   Review of Systems denies weight loss, blurry vision, chest pain, sob, n/v, urinary  frequency, excessive diaphoresis, depression, hypoglycemia, and easy bruising.  She has burning of the toes.  She has intermittent headache, memory loss, rhinorrhea, and leg cramps.     Objective:   Physical Exam VS: see vs page GEN: no distress HEAD: head: no deformity eyes: no periorbital swelling, no proptosis external nose and ears are normal mouth: no lesion seen NECK: supple, thyroid is slightly enlarged bilaterally.  No palpable nodule CHEST WALL: no deformity.  There is a median sternotomy scar LUNGS:  Clear to auscultation CV: reg rate and rhythm, no murmur. ABD: abdomen is soft, nontender.  no hepatosplenomegaly.  not distended.  no hernia MUSCULOSKELETAL: muscle bulk and strength are grossly normal.  no obvious joint swelling.  gait is normal and steady EXTEMITIES: no deformity.  no ulcer on the feet.  feet are of normal color and temp.  Trace blat leg edema.  There are bilat varicosities.  there is a vein harvest scar on the right leg.   PULSES: dorsalis pedis intact bilat.  no carotid bruit. NEURO:  cn 2-12 grossly intact.   readily moves all 4's.  sensation is intact to touch on the feet SKIN:  Normal texture and temperature.  No rash or suspicious lesion is visible.   NODES:  None palpable at the neck. PSYCH: alert, oriented x3.  Does not appear anxious nor depressed.  Lab Results  Component Value Date   HGBA1C 8.6* 08/11/2011      Assessment & Plan:  Dm, Needs increased rx, if it can be done with a regimen that avoids or minimizes hypoglycemia. Renal insufficiency.  This limits dm med options H/o tia and other med probs.  These severely limit exercise rx of dm. Leg sxs, prob neuropathic.   Cad. This makes hypoglycemia very risky.

## 2011-09-11 ENCOUNTER — Encounter: Payer: Self-pay | Admitting: Gastroenterology

## 2011-09-11 ENCOUNTER — Ambulatory Visit (INDEPENDENT_AMBULATORY_CARE_PROVIDER_SITE_OTHER): Payer: Medicare Other | Admitting: Gastroenterology

## 2011-09-11 VITALS — BP 142/80 | HR 80 | Ht 65.0 in | Wt 188.0 lb

## 2011-09-11 DIAGNOSIS — I639 Cerebral infarction, unspecified: Secondary | ICD-10-CM | POA: Insufficient documentation

## 2011-09-11 DIAGNOSIS — Z800 Family history of malignant neoplasm of digestive organs: Secondary | ICD-10-CM

## 2011-09-11 DIAGNOSIS — K5900 Constipation, unspecified: Secondary | ICD-10-CM

## 2011-09-11 DIAGNOSIS — Z86010 Personal history of colonic polyps: Secondary | ICD-10-CM

## 2011-09-11 MED ORDER — PEG-KCL-NACL-NASULF-NA ASC-C 100 G PO SOLR: 1.0000 | Freq: Once | ORAL | Status: DC

## 2011-09-11 NOTE — Progress Notes (Signed)
History of Present Illness: This is an 77 year old female with a history of an adenomatous polyp diagnosed in 2003. Her mother developed colon cancer at 61 and her brother developed colon cancer at 52. She notes a change in bowel habits starting about 6 or 7 months ago with constipation. She began a stool softener/laxative combination every other day and her constipation resolved. Her chronic reflux symptoms are fairly well controlled on ranitidine twice daily. Control was better when she took omeprazole. Denies weight loss, abdominal pain, diarrhea, change in stool caliber, melena, hematochezia, nausea, vomiting, dysphagia, chest pain.    Review of Systems: Pertinent positive and negative review of systems were noted in the above HPI section. All other review of systems were otherwise negative.  Current Medications, Allergies, Past Medical History, Past Surgical History, Family History and Social History were reviewed in [employer] record.  Physical Exam: General: Well developed , well nourished, no acute distress Head: Normocephalic and atraumatic Eyes:  sclerae anicteric, EOMI Ears: Normal auditory acuity Mouth: No deformity or lesions Neck: Supple, no masses or thyromegaly Lungs: Clear throughout to auscultation Heart: Regular rate and rhythm; no murmurs, rubs or bruits Abdomen: Soft, non tender and non distended. No masses, hepatosplenomegaly or hernias noted. Normal Bowel sounds Rectal: Deferred to colonoscopy  Musculoskeletal: Symmetrical with no gross deformities  Skin: No lesions on visible extremities Pulses:  Normal pulses noted Extremities: No clubbing, cyanosis, edema or deformities noted Neurological: Alert oriented x 4, grossly nonfocal Cervical Nodes:  No significant cervical adenopathy Inguinal Nodes: No significant inguinal adenopathy Psychological:  Alert and cooperative. Normal mood and affect  Assessment and Recommendations:  1. Personal  history of adenomatous colon polyps, strong family history of colon cancer, change in bowel habits. High-fiber diet with increased daily water intake. Continue stool softener laxative combination as this has been effective for her. The risks, benefits, and alternatives to colonoscopy with possible biopsy and possible polypectomy were discussed with the patient and they consent to proceed.   2. Prior history of TIA. Contact her prescribing physician regarding a 5-7 day hold the Plavix for colonoscopy.  3. GERD. Continue standard antireflux measures and ranitidine 150 mg twice a day. Consider increasing to 300 mg twice a day if her symptoms are not adequately controlled.

## 2011-09-11 NOTE — Patient Instructions (Signed)
You have been scheduled for a Colonoscopy. See separate instructions.  Pick up your prep kit from your pharmacy.  You will be contacted by our office prior to your procedure for directions on holding your Plavix.  If you do not hear from our office 1 week prior to your scheduled procedure, please call [phone] to discuss.  cc: Marga Melnick, MD

## 2011-09-21 ENCOUNTER — Encounter: Payer: Self-pay | Admitting: Gastroenterology

## 2011-09-21 NOTE — Telephone Encounter (Signed)
Error

## 2011-09-23 ENCOUNTER — Telehealth: Payer: Self-pay

## 2011-09-23 ENCOUNTER — Telehealth: Payer: Self-pay | Admitting: Internal Medicine

## 2011-09-23 NOTE — Telephone Encounter (Signed)
Discussed with Dr  Johney Frame  He says patient is okay to stop Plavix for her colonoscopy 5 days prior

## 2011-09-23 NOTE — Telephone Encounter (Signed)
Per Marchelle Folks calling regarding  plavix . Due to colonoscopy on 11/6. Need an answer today.

## 2011-09-23 NOTE — Telephone Encounter (Signed)
Informed patient of Dr. Johney Frame recommendations on Plavix clearance to come off 5 days prior to her procedure. Pt agreed and verbalized understanding.

## 2011-09-29 ENCOUNTER — Encounter: Payer: Self-pay | Admitting: Gastroenterology

## 2011-09-29 ENCOUNTER — Ambulatory Visit (AMBULATORY_SURGERY_CENTER): Payer: Medicare Other | Admitting: Gastroenterology

## 2011-09-29 ENCOUNTER — Ambulatory Visit (INDEPENDENT_AMBULATORY_CARE_PROVIDER_SITE_OTHER): Payer: Medicare Other | Admitting: Endocrinology

## 2011-09-29 ENCOUNTER — Encounter: Payer: Self-pay | Admitting: Endocrinology

## 2011-09-29 DIAGNOSIS — D126 Benign neoplasm of colon, unspecified: Secondary | ICD-10-CM

## 2011-09-29 DIAGNOSIS — E119 Type 2 diabetes mellitus without complications: Secondary | ICD-10-CM

## 2011-09-29 DIAGNOSIS — Z1211 Encounter for screening for malignant neoplasm of colon: Secondary | ICD-10-CM

## 2011-09-29 DIAGNOSIS — Z800 Family history of malignant neoplasm of digestive organs: Secondary | ICD-10-CM

## 2011-09-29 DIAGNOSIS — K5900 Constipation, unspecified: Secondary | ICD-10-CM

## 2011-09-29 DIAGNOSIS — Z86010 Personal history of colonic polyps: Secondary | ICD-10-CM

## 2011-09-29 LAB — GLUCOSE, CAPILLARY: Glucose-Capillary: 147 mg/dL — ABNORMAL HIGH (ref 70–99)

## 2011-09-29 MED ORDER — SODIUM CHLORIDE 0.9 % IV SOLN: 500.0000 mL | INTRAVENOUS | Status: DC

## 2011-09-29 MED ORDER — COLESEVELAM HCL 625 MG PO TABS: ORAL_TABLET | ORAL | Status: DC

## 2011-09-29 NOTE — Patient Instructions (Addendum)
good diet and exercise habits significanly improve the control of your diabetes.  please let me know if you wish to be referred to a dietician.  high blood sugar is very risky to your health.  you should see an eye doctor every year. controlling your blood pressure and cholesterol drastically reduces the damage diabetes does to your body.  this also applies to quitting smoking.  please discuss these with your doctor.  you should take an aspirin every day, unless you have been advised by a doctor not to. check your blood sugar 1 time a day.  vary the time of day when you check, between before the 3 meals, and at bedtime.  also check if you have symptoms of your blood sugar being too high or too low.  please keep a record of the readings and bring it to your next appointment here.  please call us sooner if you are having low blood sugar episodes, or if it stays over 200. we will need to take this complex situation in stages For now, add welchol 6x625 mg daily.  You should not take this with other medications.   Our goal should be to keep the blood sugar good, while minimizing or eliminating the glimepiride. Please come back for a follow-up appointment for 1 month.  Please make an appointment.

## 2011-09-29 NOTE — Progress Notes (Signed)
Subjective:    Patient ID: [name], female    DOB: 1925-05-17, 77 y.o.   MRN: [id number]  HPI pt states she feels well in general.  she brings a record of her cbg's which i have reviewed today.  It varies from 140-250.  There is no trend throughout the day.  Most are in the mid-100's.  She can't take actos due to edema.   Past Medical History  Diagnosis Date  . Sliding hiatal hernia   . Erosive esophagitis   . Peptic stricture of esophagus   . Diverticulosis   . Arthritis   . Hypothyroidism   . Diabetes mellitus     Type I  . Coronary artery disease     s/p CABG 2001  . Sick sinus syndrome     s/p Medtronic DDD pacemaker implanted  . Hypertension   . Hyperlipidemia   . GERD (gastroesophageal reflux disease)   . Adenomatous colon polyp 07/2002  . CVA (cerebral vascular accident)     Past Surgical History  Procedure Date  . Pacemaker placement 03/25/2000    by Dr Juanda Chance  . Tonsillectomy and adenoidectomy   . Appendectomy   . Cholecystectomy   . Total abdominal hysterectomy w/ bilateral salpingoophorectomy     Endometiosis  . Replacement total knee 2004    Right  . Colonoscopy w/ polypectomy 2003  . Cataract extraction 05/2005  . Coronary artery bypass graft 02/2000    5 vessel    History   Social History  . Marital Status: Widowed    Spouse Name: N/A    Number of Children: N/A  . Years of Education: N/A   Occupational History  . Not on file.   Social History Main Topics  . Smoking status: Former Games developer  . Smokeless tobacco: Never Used   Comment: quit 28+ yrs ago (as of 2012)  . Alcohol Use: No  . Drug Use: No  . Sexually Active: Not on file   Other Topics Concern  . Not on file   Social History Narrative   Lives with her grandson and his wife.Daily caffeine use.    Current Outpatient Prescriptions on File Prior to Visit  Medication Sig Dispense Refill  . Calcium Carbonate (CALCIUM 500 PO) Take 500 mg by mouth once.        . Choline Fenofibrate  (TRILIPIX) 135 MG capsule Take 1 capsule (135 mg total) by mouth daily.  90 capsule  3  . clopidogrel (PLAVIX) 75 MG tablet Take 75 mg by mouth daily.        . digoxin (LANOXIN) 0.125 MG tablet Take 125 mcg by mouth. One daily except 1 1/2 tab every Wed       . enalapril (VASOTEC) 20 MG tablet Take 20 mg by mouth daily.        Marland Kitchen glimepiride (AMARYL) 2 MG tablet Take 1 mg by mouth as directed. 1/2 by mouth daily      . hydrochlorothiazide 25 MG tablet Take 25 mg by mouth daily.        . metoprolol (TOPROL-XL) 200 MG 24 hr tablet Take 200 mg by mouth daily.        . niacin 500 MG tablet Take 500 mg by mouth daily with breakfast.        . nitroGLYCERIN (NITROSTAT) 0.3 MG SL tablet Place 0.3 mg under the tongue every 5 (five) minutes as needed.        . Omega-3 Fatty Acids (FISH OIL) 1000  MG CAPS Take 1,000 mg by mouth daily.        . peg 3350 powder (MOVIPREP) 100 G SOLR Take 1 kit (100 g total) by mouth once.  1 kit  0  . pravastatin (PRAVACHOL) 20 MG tablet TAKE ONE TABLET BY MOUTH AT BEDTIME **LABS DUE 02/2011**  90 tablet  3  . ranitidine (ZANTAC) 150 MG tablet Take 1 tablet (150 mg total) by mouth 2 (two) times daily.  180 tablet  3  . sitaGLIPtin (JANUVIA) 100 MG tablet Take 50 mg by mouth daily.        Marland Kitchen SYNTHROID 25 MCG tablet TAKE 1 TABLET DAILY EXCEPT TAKE 1 AND 1/2 TABLETS ON  WEDNESDAY. COMMENTS: DUE   FOR PHYSICAL AND LABS  90 tablet  0    Allergies  Allergen Reactions  . Codeine     Rash     Family History  Problem Relation Age of Onset  . Colon cancer Mother 109  . Breast cancer Mother   . Colon cancer Brother 29  . Breast cancer Sister   . Prostate cancer Brother     BP 128/82  Pulse 87  Temp(Src) 97.9 F (36.6 C) (Oral)  Ht 5\' 6"  (1.676 m)  Wt 181 lb 4 oz (82.214 kg)  BMI 29.25 kg/m2  SpO2 96%    Review of Systems denies hypoglycemia    Objective:   Physical Exam VITAL SIGNS:  See vs page GENERAL: no distress Ext" 1+ bilat leg edema     Assessment &  Plan:  Dm, needs increased rx

## 2011-09-29 NOTE — Patient Instructions (Signed)
Please refer to the blue and neon green sheets for instructions regarding diet and activity for the rest of today.  Handouts on polyps and diverticulosis given. Resume previous medications.

## 2011-09-30 ENCOUNTER — Telehealth: Payer: Self-pay | Admitting: *Deleted

## 2011-09-30 NOTE — Telephone Encounter (Signed)

## 2011-10-01 ENCOUNTER — Other Ambulatory Visit: Payer: Self-pay | Admitting: *Deleted

## 2011-10-01 MED ORDER — GLIMEPIRIDE 2 MG PO TABS: 1.0000 mg | ORAL_TABLET | Freq: Every day | ORAL | Status: DC

## 2011-10-05 ENCOUNTER — Encounter: Payer: Self-pay | Admitting: Gastroenterology

## 2011-10-16 ENCOUNTER — Encounter: Payer: Self-pay | Admitting: Internal Medicine

## 2011-10-20 ENCOUNTER — Other Ambulatory Visit: Payer: Self-pay | Admitting: Internal Medicine

## 2011-10-22 ENCOUNTER — Encounter: Payer: Self-pay | Admitting: Gastroenterology

## 2011-10-22 ENCOUNTER — Ambulatory Visit (INDEPENDENT_AMBULATORY_CARE_PROVIDER_SITE_OTHER): Payer: Medicare Other | Admitting: Gastroenterology

## 2011-10-22 ENCOUNTER — Other Ambulatory Visit: Payer: Self-pay | Admitting: Internal Medicine

## 2011-10-22 ENCOUNTER — Other Ambulatory Visit (INDEPENDENT_AMBULATORY_CARE_PROVIDER_SITE_OTHER): Payer: Medicare Other

## 2011-10-22 ENCOUNTER — Other Ambulatory Visit: Payer: Self-pay | Admitting: *Deleted

## 2011-10-22 VITALS — BP 124/68 | HR 72 | Ht 65.0 in | Wt 187.4 lb

## 2011-10-22 DIAGNOSIS — K219 Gastro-esophageal reflux disease without esophagitis: Secondary | ICD-10-CM

## 2011-10-22 DIAGNOSIS — E038 Other specified hypothyroidism: Secondary | ICD-10-CM

## 2011-10-22 DIAGNOSIS — R1013 Epigastric pain: Secondary | ICD-10-CM

## 2011-10-22 LAB — TSH: TSH: 3.61 u[IU]/mL (ref 0.35–5.50)

## 2011-10-22 MED ORDER — PANTOPRAZOLE SODIUM 40 MG PO TBEC: 40.0000 mg | DELAYED_RELEASE_TABLET | Freq: Every day | ORAL | Status: DC

## 2011-10-22 MED ORDER — HYDROCHLOROTHIAZIDE 25 MG PO TABS: 25.0000 mg | ORAL_TABLET | Freq: Every day | ORAL | Status: DC

## 2011-10-22 NOTE — Patient Instructions (Signed)
Stop Zantac and start Aciphex samples one tablet by mouth once daily.  A prescription for pantoprazole has been sent the pharmacy for you to take long term for acid reflux.  Patient advised to avoid spicy, acidic, citrus, chocolate, mints, fruit and fruit juices.  Limit the intake of caffeine, alcohol and Soda.  Don't exercise too soon after eating.  Don't lie down within 3-4 hours of eating.  Elevate the head of your bed. cc: Marga Melnick, MD

## 2011-10-22 NOTE — Progress Notes (Signed)
History of Present Illness: This is an 77 -year-old female who relates worsening problems with epigastric and substernal burning frequently following meals and occasionally at night for the past several weeks. She has a history of erosive esophagitis and an esophageal stricture. Her last endoscopy was in 1999. She has recently been maintained on ranitidine which is not been effective. Denies weight loss, constipation, diarrhea, change in stool caliber, melena, hematochezia, nausea, vomiting, dysphagia, chest pain.  Current Medications, Allergies, Past Medical History, Past Surgical History, Family History and Social History were reviewed in [employer] record.  Physical Exam: General: Well developed , well nourished, elderly, no acute distress Head: Normocephalic and atraumatic Eyes:  sclerae anicteric, EOMI Ears: Normal auditory acuity Mouth: No deformity or lesions Lungs: Clear throughout to auscultation Heart: Regular rate and rhythm; no murmurs, rubs or bruits Abdomen: Soft, non tender and non distended. No masses, hepatosplenomegaly or hernias noted. Normal Bowel sounds Extremities: No clubbing, cyanosis, edema or deformities noted Neurological: Alert oriented x 4, grossly nonfocal Psychological:  Alert and cooperative. Normal mood and affect  Assessment and Recommendations:  1. GERD with refractory symptoms. History of a peptic stricture and erosive esophagitis. Discontinue ranitidine and begin PPI therapy with AcipHex samples 20 mg daily and then pantoprazole 40 mg daily for long term use. Will avoid omeprazole as she is maintained on Plavix. Continue all standard antireflux measures. Contact us if her symptoms do not resolve over the next few weeks.

## 2011-10-22 NOTE — Telephone Encounter (Signed)
TSH 244.9 

## 2011-10-29 ENCOUNTER — Ambulatory Visit (INDEPENDENT_AMBULATORY_CARE_PROVIDER_SITE_OTHER): Payer: Medicare Other | Admitting: Endocrinology

## 2011-10-29 ENCOUNTER — Other Ambulatory Visit (INDEPENDENT_AMBULATORY_CARE_PROVIDER_SITE_OTHER): Payer: Medicare Other

## 2011-10-29 ENCOUNTER — Encounter: Payer: Self-pay | Admitting: Endocrinology

## 2011-10-29 VITALS — BP 122/68 | HR 89 | Temp 97.9°F | Ht 66.0 in | Wt 187.0 lb

## 2011-10-29 DIAGNOSIS — E119 Type 2 diabetes mellitus without complications: Secondary | ICD-10-CM

## 2011-10-29 LAB — HEMOGLOBIN A1C: Hgb A1c MFr Bld: 8.1 % — ABNORMAL HIGH (ref 4.6–6.5)

## 2011-10-29 MED ORDER — BROMOCRIPTINE MESYLATE 2.5 MG PO TABS: 2.5000 mg | ORAL_TABLET | Freq: Every day | ORAL | Status: DC

## 2011-10-29 NOTE — Progress Notes (Signed)
Subjective:    Patient ID: [name], female    DOB: 05/27/1925, 77 y.o.   MRN: [id number]  HPI Pt returns for f/u of type 2 DM (2001).  She is not on metformin, due to chf.  pt states she feels well in general.  she brings a record of her cbg's which i have reviewed today.  It varies from 165-227.  It is in general higher as the day goes on Past Medical History  Diagnosis Date  . Sliding hiatal hernia   . Erosive esophagitis   . Peptic stricture of esophagus   . Diverticulosis   . Arthritis   . Hypothyroidism   . Diabetes mellitus     Type I  . Coronary artery disease     s/p CABG 2001  . Sick sinus syndrome     s/p Medtronic DDD pacemaker implanted  . Hypertension   . Hyperlipidemia   . GERD (gastroesophageal reflux disease)   . Adenomatous colon polyp 07/2002  . CVA (cerebral vascular accident)     Past Surgical History  Procedure Date  . Pacemaker placement 03/25/2000    by Dr Juanda Chance  . Tonsillectomy and adenoidectomy   . Appendectomy   . Cholecystectomy   . Total abdominal hysterectomy w/ bilateral salpingoophorectomy     Endometiosis  . Replacement total knee 2004    Right  . Colonoscopy w/ polypectomy 2003  . Cataract extraction 05/2005  . Coronary artery bypass graft 02/2000    5 vessel    History   Social History  . Marital Status: Widowed    Spouse Name: N/A    Number of Children: 1  . Years of Education: N/A   Occupational History  . Retired    Social History Main Topics  . Smoking status: Former Games developer  . Smokeless tobacco: Never Used   Comment: quit 28+ yrs ago (as of 2012)  . Alcohol Use: No  . Drug Use: No  . Sexually Active: Not on file   Other Topics Concern  . Not on file   Social History Narrative   Lives with her grandson and his wife.Daily caffeine use.    Current Outpatient Prescriptions on File Prior to Visit  Medication Sig Dispense Refill  . Calcium Carbonate (CALCIUM 500 PO) Take 500 mg by mouth once.        . Choline  Fenofibrate (TRILIPIX) 135 MG capsule Take 1 capsule (135 mg total) by mouth daily.  90 capsule  3  . clopidogrel (PLAVIX) 75 MG tablet Take 75 mg by mouth daily.        . colesevelam (WELCHOL) 625 MG tablet 6 tabs daily  180 tablet  11  . digoxin (LANOXIN) 0.125 MG tablet Take 125 mcg by mouth. One daily except 1 1/2 tab every Wed       . enalapril (VASOTEC) 20 MG tablet Take 20 mg by mouth daily.        Marland Kitchen glimepiride (AMARYL) 2 MG tablet Take 0.5 tablets (1 mg total) by mouth daily.  30 tablet  2  . hydrochlorothiazide (HYDRODIURIL) 25 MG tablet Take 1 tablet (25 mg total) by mouth daily.  90 tablet  3  . metoprolol (TOPROL-XL) 200 MG 24 hr tablet Take 200 mg by mouth daily.        . niacin 500 MG tablet Take 500 mg by mouth daily with breakfast.        . nitroGLYCERIN (NITROSTAT) 0.3 MG SL tablet Place 0.3 mg under  the tongue every 5 (five) minutes as needed.        . Omega-3 Fatty Acids (FISH OIL) 1000 MG CAPS Take 1,000 mg by mouth daily.        . pantoprazole (PROTONIX) 40 MG tablet Take 1 tablet (40 mg total) by mouth daily.  30 tablet  11  . pravastatin (PRAVACHOL) 20 MG tablet TAKE ONE TABLET BY MOUTH AT BEDTIME **LABS DUE 02/2011**  90 tablet  3  . sitaGLIPtin (JANUVIA) 100 MG tablet Take 50 mg by mouth daily.        Marland Kitchen SYNTHROID 25 MCG tablet TAKE 1 TABLET DAILY EXCEPT TAKE 1 AND 1/2 TABLETS ON  WEDNESDAY. COMMENTS: DUE   FOR PHYSICAL AND LABS  90 tablet  0   Current Facility-Administered Medications on File Prior to Visit  Medication Dose Route Frequency Provider Last Rate Last Dose  . 0.9 %  sodium chloride infusion  500 mL Intravenous Continuous Eliezer Bottom., MD,FACG        Allergies  Allergen Reactions  . Codeine     Rash     Family History  Problem Relation Age of Onset  . Colon cancer Mother 18  . Breast cancer Mother   . Colon cancer Brother 34  . Breast cancer Sister   . Prostate cancer Brother    BP 122/68  Pulse 89  Temp(Src) 97.9 F (36.6 C) (Oral)   Ht 5\' 6"  (1.676 m)  Wt 187 lb (84.823 kg)  BMI 30.18 kg/m2  SpO2 96%  Review of Systems denies hypoglycemia    Objective:   Physical Exam VITAL SIGNS:  See vs page GENERAL: no distress PSYCH: Alert and oriented x 3.  Does not appear anxious nor depressed.  Lab Results  Component Value Date   HGBA1C 8.1* 10/29/2011      Assessment & Plan:  DM, needs increased rx

## 2011-10-29 NOTE — Patient Instructions (Addendum)
check your blood sugar 1 time a day.  vary the time of day when you check, between before the 3 meals, and at bedtime.  also check if you have symptoms of your blood sugar being too high or too low.  please keep a record of the readings and bring it to your next appointment here.  please call us sooner if you are having low blood sugar episodes, or if it stays over 200. we will need to take this complex situation in stages. Please come back for a follow-up appointment for 1 month.  Please make an appointment.   blood tests are being requested for you today.  please call [phone] to hear your test results.  You will be prompted to enter the 9-digit "MRN" number that appears at the top left of this page, followed by #.  Then you will hear the message.   This blood test will probably say you need insulin.  The best way to take insulin is to take it 3x a day (just before each meal).  i would be happy to refer you to a diabetes education specialist, to learn about insulin injections.   (update: i left message on phone-tree:  i sent rx to [facility].  The first week, take just 1/2 pill.  Ret 3 mos).

## 2011-11-25 DIAGNOSIS — M1710 Unilateral primary osteoarthritis, unspecified knee: Secondary | ICD-10-CM | POA: Diagnosis not present

## 2011-12-03 ENCOUNTER — Other Ambulatory Visit: Payer: Self-pay | Admitting: Internal Medicine

## 2011-12-03 MED ORDER — LEVOTHYROXINE SODIUM 25 MCG PO TABS: 25.0000 ug | ORAL_TABLET | Freq: Every day | ORAL | Status: DC

## 2011-12-03 NOTE — Telephone Encounter (Signed)
RX sent

## 2011-12-03 NOTE — Telephone Encounter (Signed)
I don't see [facility] in the computer? I called patient and she stated the fax number is [phone]. I can't do it from [place] though because Hopp needs to sign it.

## 2011-12-04 ENCOUNTER — Encounter: Payer: Self-pay | Admitting: Internal Medicine

## 2011-12-04 DIAGNOSIS — I495 Sick sinus syndrome: Secondary | ICD-10-CM | POA: Diagnosis not present

## 2011-12-07 ENCOUNTER — Ambulatory Visit (INDEPENDENT_AMBULATORY_CARE_PROVIDER_SITE_OTHER): Payer: Medicare Other | Admitting: Internal Medicine

## 2011-12-07 DIAGNOSIS — H01009 Unspecified blepharitis unspecified eye, unspecified eyelid: Secondary | ICD-10-CM | POA: Diagnosis not present

## 2011-12-07 MED ORDER — DOXYCYCLINE HYCLATE 100 MG PO TABS: 100.0000 mg | ORAL_TABLET | Freq: Two times a day (BID) | ORAL | Status: AC

## 2011-12-07 MED ORDER — GENTAMICIN SULFATE 0.3 % OP SOLN: 1.0000 [drp] | OPHTHALMIC | Status: AC

## 2011-12-07 NOTE — Progress Notes (Signed)
  Subjective:    Patient ID: [name], female    DOB: 1925/04/18, 78 y.o.   MRN: [id number]  HPI Acute visit Complains of right more than left eye irritation for the last few days along with a mild frontal headache and some sinus discharge. 3 months ago her eye checkup showed no evidence of glaucoma.  Past Medical History  Diagnosis Date  . Sliding hiatal hernia   . Erosive esophagitis   . Peptic stricture of esophagus   . Diverticulosis   . Arthritis   . Hypothyroidism   . Diabetes mellitus     Type I  . Coronary artery disease     s/p CABG 2001  . Sick sinus syndrome     s/p Medtronic DDD pacemaker implanted  . Hypertension   . Hyperlipidemia   . GERD (gastroesophageal reflux disease)   . Adenomatous colon polyp 07/2002  . CVA (cerebral vascular accident)    Past Surgical History  Procedure Date  . Pacemaker placement 03/25/2000    by Dr Juanda Chance  . Tonsillectomy and adenoidectomy   . Appendectomy   . Cholecystectomy   . Total abdominal hysterectomy w/ bilateral salpingoophorectomy     Endometiosis  . Replacement total knee 2004    Right  . Colonoscopy w/ polypectomy 2003  . Cataract extraction 05/2005  . Coronary artery bypass graft 02/2000    5 vessel     Review of Systems Vision is normal No fever or chills No chest congestion No nausea, vomiting, diarrhea.     Objective:   Physical Exam  Constitutional: She appears well-developed and well-nourished. No distress.  HENT:  Head: Normocephalic and atraumatic.       Nose not congested, sinuses nontender to palpation  Eyes: EOM are normal. Pupils are equal, round, and reactive to light.    Pulmonary/Chest: Effort normal and breath sounds normal. No respiratory distress. She has no wheezes. She has no rales.  Skin: She is not diaphoretic.       Assessment & Plan:  Posterior blepharitis: Patient with diabetes presents with blepharitis. Plan: Antibiotics, patient aware that needs to be seen again or  go to the ER if facial swelling gets worse. At the present time she does not appear to have facial cellulitis just swelling from the blepharitis.

## 2011-12-07 NOTE — Patient Instructions (Signed)
Warm compress to the right eye. Take medications as prescribed. Go to the ER immediately if the if facial swelling gets worse. Call if not better in 3 - 4  days

## 2011-12-08 ENCOUNTER — Encounter: Payer: Self-pay | Admitting: Internal Medicine

## 2011-12-09 ENCOUNTER — Telehealth: Payer: Self-pay

## 2011-12-09 NOTE — Telephone Encounter (Signed)
thank you !!

## 2011-12-09 NOTE — Telephone Encounter (Signed)
Called pt to get an update on how she was feeling.  Pt states her eye is much better and the swelling has gone down some.

## 2011-12-10 ENCOUNTER — Other Ambulatory Visit: Payer: Self-pay | Admitting: Gastroenterology

## 2011-12-10 ENCOUNTER — Other Ambulatory Visit: Payer: Self-pay | Admitting: *Deleted

## 2011-12-10 MED ORDER — PANTOPRAZOLE SODIUM 40 MG PO TBEC: 40.0000 mg | DELAYED_RELEASE_TABLET | Freq: Every day | ORAL | Status: DC

## 2011-12-10 NOTE — Telephone Encounter (Signed)
Pt is requesting refill of Welchol and Glimepiride for 90 day supply to be printed and faxed to her mail order pharmacy. Pt wants to know if Glimeperide has a 1mg  dosage so that she doesn't have to cut Glimepiride 2mg  tablet in half. Plese advise on Glimepiride dosage

## 2011-12-10 NOTE — Telephone Encounter (Signed)
Prescription printed and sent to mail order pharmacy. Pt notified by voicemail.

## 2011-12-10 NOTE — Telephone Encounter (Signed)
i changed glimepiride.  Please refill both x 1 year

## 2011-12-11 MED ORDER — COLESEVELAM HCL 625 MG PO TABS: ORAL_TABLET | ORAL | Status: DC

## 2011-12-11 MED ORDER — GLIMEPIRIDE 1 MG PO TABS: 1.0000 mg | ORAL_TABLET | Freq: Every day | ORAL | Status: DC

## 2011-12-11 NOTE — Telephone Encounter (Signed)
Rx for [facility] faxed to [facility]. Rx for Glimepiride sent to [facility]. Pt informed both rx's sent to pharmacies.

## 2011-12-15 DIAGNOSIS — H1030 Unspecified acute conjunctivitis, unspecified eye: Secondary | ICD-10-CM | POA: Diagnosis not present

## 2011-12-16 ENCOUNTER — Other Ambulatory Visit: Payer: Self-pay | Admitting: *Deleted

## 2011-12-16 MED ORDER — CLOPIDOGREL BISULFATE 75 MG PO TABS: 75.0000 mg | ORAL_TABLET | Freq: Every day | ORAL | Status: DC

## 2011-12-21 ENCOUNTER — Other Ambulatory Visit: Payer: Self-pay | Admitting: Internal Medicine

## 2011-12-21 MED ORDER — CLOPIDOGREL BISULFATE 75 MG PO TABS: 75.0000 mg | ORAL_TABLET | Freq: Every day | ORAL | Status: DC

## 2011-12-21 NOTE — Telephone Encounter (Signed)
New Msg: pt calling wanting generic plavix called into [facility].

## 2011-12-23 ENCOUNTER — Encounter: Payer: Self-pay | Admitting: Internal Medicine

## 2011-12-23 ENCOUNTER — Ambulatory Visit (INDEPENDENT_AMBULATORY_CARE_PROVIDER_SITE_OTHER): Payer: Medicare Other | Admitting: *Deleted

## 2011-12-23 DIAGNOSIS — I442 Atrioventricular block, complete: Secondary | ICD-10-CM

## 2011-12-23 LAB — PACEMAKER DEVICE OBSERVATION
BATTERY VOLTAGE: 2.65 V
RV LEAD AMPLITUDE: 11.2 mv
VENTRICULAR PACING PM: 35

## 2011-12-23 NOTE — Progress Notes (Signed)
PPM check 

## 2011-12-29 ENCOUNTER — Other Ambulatory Visit: Payer: Self-pay | Admitting: Internal Medicine

## 2011-12-29 MED ORDER — HYDROCHLOROTHIAZIDE 25 MG PO TABS: 25.0000 mg | ORAL_TABLET | Freq: Every day | ORAL | Status: DC

## 2011-12-29 NOTE — Telephone Encounter (Signed)
Refill request HCTZ.  Fax # [phone]

## 2012-01-07 ENCOUNTER — Other Ambulatory Visit: Payer: Self-pay | Admitting: *Deleted

## 2012-01-07 MED ORDER — ENALAPRIL MALEATE 20 MG PO TABS: 20.0000 mg | ORAL_TABLET | Freq: Every day | ORAL | Status: DC

## 2012-01-07 MED ORDER — METOPROLOL SUCCINATE ER 200 MG PO TB24: 200.0000 mg | ORAL_TABLET | Freq: Every day | ORAL | Status: DC

## 2012-01-25 ENCOUNTER — Encounter: Payer: Self-pay | Admitting: Internal Medicine

## 2012-01-25 ENCOUNTER — Ambulatory Visit (INDEPENDENT_AMBULATORY_CARE_PROVIDER_SITE_OTHER): Payer: Medicare Other | Admitting: Internal Medicine

## 2012-01-25 DIAGNOSIS — Z950 Presence of cardiac pacemaker: Secondary | ICD-10-CM | POA: Diagnosis not present

## 2012-01-25 DIAGNOSIS — I495 Sick sinus syndrome: Secondary | ICD-10-CM | POA: Diagnosis not present

## 2012-01-25 LAB — PACEMAKER DEVICE OBSERVATION: RV LEAD AMPLITUDE: 11.2 mv

## 2012-01-25 NOTE — Progress Notes (Signed)
PCP:  Marga Melnick, MD, MD  The patient presents today for routine electrophysiology followup.  Since last being seen in our clinic, the patient reports doing very well.  She remains active despite her age. Today, she denies symptoms of palpitations, chest pain, shortness of breath,  dizziness, presyncope, syncope, or neurologic sequela.  The patient feels that she is tolerating medications without difficulties and is otherwise without complaint today.   Past Medical History  Diagnosis Date  . Sliding hiatal hernia   . Erosive esophagitis   . Peptic stricture of esophagus   . Diverticulosis   . Arthritis   . Hypothyroidism   . Diabetes mellitus     Type I  . Coronary artery disease     s/p CABG 2001  . Sick sinus syndrome     s/p Medtronic DDD pacemaker implanted  . Hypertension   . Hyperlipidemia   . GERD (gastroesophageal reflux disease)   . Adenomatous colon polyp 07/2002  . CVA (cerebral vascular accident)    Past Surgical History  Procedure Date  . Pacemaker placement 03/25/2000    by Dr Juanda Chance  . Tonsillectomy and adenoidectomy   . Appendectomy   . Cholecystectomy   . Total abdominal hysterectomy w/ bilateral salpingoophorectomy     Endometiosis  . Replacement total knee 2004    Right  . Colonoscopy w/ polypectomy 2003  . Cataract extraction 05/2005  . Coronary artery bypass graft 02/2000    5 vessel    Current Outpatient Prescriptions  Medication Sig Dispense Refill  . bromocriptine (PARLODEL) 2.5 MG tablet Take 1 tablet (2.5 mg total) by mouth daily.  30 tablet  11  . Calcium Carbonate (CALCIUM 500 PO) Take 500 mg by mouth once.        . Choline Fenofibrate (TRILIPIX) 135 MG capsule Take 1 capsule (135 mg total) by mouth daily.  90 capsule  3  . clopidogrel (PLAVIX) 75 MG tablet Take 1 tablet (75 mg total) by mouth daily.  90 tablet  0  . colesevelam (WELCHOL) 625 MG tablet 6 tabs daily  540 tablet  3  . digoxin (LANOXIN) 0.125 MG tablet Take 125 mcg by mouth.  One daily except 1 1/2 tab every Wed       . enalapril (VASOTEC) 20 MG tablet Take 1 tablet (20 mg total) by mouth daily.  90 tablet  0  . glimepiride (AMARYL) 1 MG tablet Take 1 tablet (1 mg total) by mouth daily before breakfast.  90 tablet  3  . glimepiride (AMARYL) 1 MG tablet Take 1 tablet (1 mg total) by mouth daily before breakfast.  90 tablet  3  . hydrochlorothiazide (HYDRODIURIL) 25 MG tablet Take 1 tablet (25 mg total) by mouth daily.  90 tablet  3  . levothyroxine (SYNTHROID) 25 MCG tablet Take 1 tablet (25 mcg total) by mouth daily.  90 tablet  3  . metoprolol (TOPROL-XL) 200 MG 24 hr tablet Take 1 tablet (200 mg total) by mouth daily.  90 tablet  0  . niacin 500 MG tablet Take 500 mg by mouth daily with breakfast.        . nitroGLYCERIN (NITROSTAT) 0.3 MG SL tablet Place 0.3 mg under the tongue every 5 (five) minutes as needed.        . Omega-3 Fatty Acids (FISH OIL) 1000 MG CAPS Take 1,000 mg by mouth daily.        . pantoprazole (PROTONIX) 40 MG tablet Take 1 tablet (40 mg total)  by mouth daily.  90 tablet  3  . pravastatin (PRAVACHOL) 20 MG tablet TAKE ONE TABLET BY MOUTH AT BEDTIME **LABS DUE 02/2011**  90 tablet  3  . sitaGLIPtin (JANUVIA) 100 MG tablet Take 50 mg by mouth daily.         Current Facility-Administered Medications  Medication Dose Route Frequency Provider Last Rate Last Dose  . 0.9 %  sodium chloride infusion  500 mL Intravenous Continuous Eliezer Bottom., MD,FACG        Allergies  Allergen Reactions  . Codeine     Rash     History   Social History  . Marital Status: Widowed    Spouse Name: N/A    Number of Children: 1  . Years of Education: N/A   Occupational History  . Retired    Social History Main Topics  . Smoking status: Former Games developer  . Smokeless tobacco: Never Used   Comment: quit 28+ yrs ago (as of 2012)  . Alcohol Use: No  . Drug Use: No  . Sexually Active: Not on file   Other Topics Concern  . Not on file   Social  History Narrative   Lives with her grandson and his wife.Daily caffeine use.    Family History  Problem Relation Age of Onset  . Colon cancer Mother 34  . Breast cancer Mother   . Colon cancer Brother 48  . Breast cancer Sister   . Prostate cancer Brother     Physical Exam: Filed Vitals:   01/25/12 1003  BP: 150/76  Pulse: 82  Resp: 18  Height: 5\' 5"  (1.651 m)  Weight: 192 lb 1.6 oz (87.136 kg)    GEN- The patient is well appearing, alert and oriented x 3 today.   Head- normocephalic, atraumatic Eyes-  Sclera clear, conjunctiva pink Ears- hearing intact Oropharynx- clear Neck- supple, no JVP R sided pacemaker is well healed Lungs- Clear to ausculation bilaterally, normal work of breathing Chest- pacemaker pocket is well healed Heart- Regular rate and rhythm, no murmurs, rubs or gallops, PMI not laterally displaced GI- soft, NT, ND, + BS Extremities- no clubbing, cyanosis, or edema  Pacemaker interrogation- reviewed in detail today,  See PACEART report  Assessment and Plan:

## 2012-01-25 NOTE — Patient Instructions (Signed)
Your physician recommends that you schedule a follow-up appointment in: 4 weeks with Dr Allred.  

## 2012-01-25 NOTE — Assessment & Plan Note (Signed)
Normal pacemaker function See Arita Miss Art report No changes today  Approaching ERI, Return in 1 month for battery check

## 2012-01-27 ENCOUNTER — Other Ambulatory Visit (INDEPENDENT_AMBULATORY_CARE_PROVIDER_SITE_OTHER): Payer: Medicare Other

## 2012-01-27 ENCOUNTER — Encounter: Payer: Self-pay | Admitting: Endocrinology

## 2012-01-27 ENCOUNTER — Ambulatory Visit (INDEPENDENT_AMBULATORY_CARE_PROVIDER_SITE_OTHER): Payer: Medicare Other | Admitting: Endocrinology

## 2012-01-27 VITALS — BP 142/72 | HR 89 | Temp 97.0°F | Ht 66.0 in | Wt 192.8 lb

## 2012-01-27 DIAGNOSIS — E1165 Type 2 diabetes mellitus with hyperglycemia: Secondary | ICD-10-CM | POA: Diagnosis not present

## 2012-01-27 DIAGNOSIS — E1169 Type 2 diabetes mellitus with other specified complication: Secondary | ICD-10-CM

## 2012-01-27 LAB — HEMOGLOBIN A1C: Hgb A1c MFr Bld: 8 % — ABNORMAL HIGH (ref 4.6–6.5)

## 2012-01-27 MED ORDER — BROMOCRIPTINE MESYLATE 2.5 MG PO TABS: 2.5000 mg | ORAL_TABLET | Freq: Two times a day (BID) | ORAL | Status: DC

## 2012-01-27 NOTE — Progress Notes (Signed)
Subjective:    Patient ID: [name], female    DOB: [birth year], 78 y.o.   MRN: [id number]  HPI Pt returns for f/u of type 2 DM (2001).  She is not on metformin, due to chf.  She is on minimal amaryl, due to renal insuff.  she brings a record of her cbg's which i have reviewed today.  It varies from 93-300, but most are in the 100's.  There is no trend throughout the day.  Past Medical History  Diagnosis Date  . Sliding hiatal hernia   . Erosive esophagitis   . Peptic stricture of esophagus   . Diverticulosis   . Arthritis   . Hypothyroidism   . Diabetes mellitus     Type I  . Coronary artery disease     s/p CABG 2001  . Sick sinus syndrome     s/p Medtronic DDD pacemaker implanted  . Hypertension   . Hyperlipidemia   . GERD (gastroesophageal reflux disease)   . Adenomatous colon polyp 07/2002  . CVA (cerebral vascular accident)     Past Surgical History  Procedure Date  . Pacemaker placement 03/25/2000    by Dr Juanda Chance  . Tonsillectomy and adenoidectomy   . Appendectomy   . Cholecystectomy   . Total abdominal hysterectomy w/ bilateral salpingoophorectomy     Endometiosis  . Replacement total knee 2004    Right  . Colonoscopy w/ polypectomy 2003  . Cataract extraction 05/2005  . Coronary artery bypass graft 02/2000    5 vessel    History   Social History  . Marital Status: Widowed    Spouse Name: N/A    Number of Children: 1  . Years of Education: N/A   Occupational History  . Retired    Social History Main Topics  . Smoking status: Former Games developer  . Smokeless tobacco: Never Used   Comment: quit 28+ yrs ago (as of 2012)  . Alcohol Use: No  . Drug Use: No  . Sexually Active: Not on file   Other Topics Concern  . Not on file   Social History Narrative   Lives with her grandson and his wife.Daily caffeine use.    Current Outpatient Prescriptions on File Prior to Visit  Medication Sig Dispense Refill  . Calcium Carbonate (CALCIUM 500 PO) Take 500 mg  by mouth once.        . Choline Fenofibrate (TRILIPIX) 135 MG capsule Take 1 capsule (135 mg total) by mouth daily.  90 capsule  3  . clopidogrel (PLAVIX) 75 MG tablet Take 1 tablet (75 mg total) by mouth daily.  90 tablet  0  . colesevelam (WELCHOL) 625 MG tablet 6 tabs daily  540 tablet  3  . digoxin (LANOXIN) 0.125 MG tablet Take 125 mcg by mouth. One daily except 1 1/2 tab every Wed       . enalapril (VASOTEC) 20 MG tablet Take 1 tablet (20 mg total) by mouth daily.  90 tablet  0  . glimepiride (AMARYL) 1 MG tablet Take 1 tablet (1 mg total) by mouth daily before breakfast.  90 tablet  3  . hydrochlorothiazide (HYDRODIURIL) 25 MG tablet Take 1 tablet (25 mg total) by mouth daily.  90 tablet  3  . levothyroxine (SYNTHROID) 25 MCG tablet Take 1 tablet (25 mcg total) by mouth daily.  90 tablet  3  . metoprolol (TOPROL-XL) 200 MG 24 hr tablet Take 1 tablet (200 mg total) by mouth daily.  90  tablet  0  . niacin 500 MG tablet Take 500 mg by mouth daily with breakfast.        . nitroGLYCERIN (NITROSTAT) 0.3 MG SL tablet Place 0.3 mg under the tongue every 5 (five) minutes as needed.        . Omega-3 Fatty Acids (FISH OIL) 1000 MG CAPS Take 1,000 mg by mouth daily.        . pantoprazole (PROTONIX) 40 MG tablet Take 1 tablet (40 mg total) by mouth daily.  90 tablet  3  . pravastatin (PRAVACHOL) 20 MG tablet TAKE ONE TABLET BY MOUTH AT BEDTIME **LABS DUE 02/2011**  90 tablet  3  . sitaGLIPtin (JANUVIA) 100 MG tablet Take 50 mg by mouth daily.         Current Facility-Administered Medications on File Prior to Visit  Medication Dose Route Frequency Provider Last Rate Last Dose  . 0.9 %  sodium chloride infusion  500 mL Intravenous Continuous Meryl Dare, MD,FACG        Allergies  Allergen Reactions  . Codeine     Rash     Family History  Problem Relation Age of Onset  . Colon cancer Mother 44  . Breast cancer Mother   . Colon cancer Brother 71  . Breast cancer Sister   . Prostate  cancer Brother     BP 142/72  Pulse 89  Temp(Src) 97 F (36.1 C) (Oral)  Ht 5\' 6"  (1.676 m)  Wt 192 lb 12.8 oz (87.454 kg)  BMI 31.12 kg/m2  SpO2 96%    Review of Systems denies hypoglycemia    Objective:   Physical Exam VITAL SIGNS:  See vs page GENERAL: no distress EXTEMITIES: no deformity. no ulcer on the feet. feet are of normal color and temp. Trace blat leg edema. There are bilat varicosities. there is a vein harvest scar on the right leg.  PULSES: dorsalis pedis intact bilat.  NEURO: sensation is intact to touch on the feet    Lab Results  Component Value Date   HGBA1C 8.0* 01/27/2012      Assessment & Plan:  DM.  Therapy limited by variable cbg's.

## 2012-01-27 NOTE — Patient Instructions (Addendum)
check your blood sugar 1 time a day.  vary the time of day when you check, between before the 3 meals, and at bedtime.  also check if you have symptoms of your blood sugar being too high or too low.  please keep a record of the readings and bring it to your next appointment here.  please call us sooner if you are having low blood sugar episodes, or if it stays over 200. blood tests are being requested for you today.  please call [phone] to hear your test results.  You will be prompted to enter the 9-digit "MRN" number that appears at the top left of this page, followed by #.  Then you will hear the message. Please come back for a follow-up appointment for 3 months.  (update: i left message on phone-tree:  rx as we discussed).

## 2012-01-29 ENCOUNTER — Other Ambulatory Visit: Payer: Self-pay | Admitting: *Deleted

## 2012-01-29 MED ORDER — BROMOCRIPTINE MESYLATE 2.5 MG PO TABS: 2.5000 mg | ORAL_TABLET | Freq: Two times a day (BID) | ORAL | Status: DC

## 2012-01-29 NOTE — Telephone Encounter (Signed)
R'cd fax from [facility] regarding Bromocriptine rx. Rx was sent for 30 day supply and needs to be resent for 90 day supply. Rx sent to pharmacy.

## 2012-02-12 ENCOUNTER — Ambulatory Visit (INDEPENDENT_AMBULATORY_CARE_PROVIDER_SITE_OTHER): Payer: Medicare Other | Admitting: Family Medicine

## 2012-02-12 ENCOUNTER — Encounter: Payer: Self-pay | Admitting: Family Medicine

## 2012-02-12 VITALS — BP 140/72 | HR 86 | Temp 99.4°F | Wt 186.2 lb

## 2012-02-12 DIAGNOSIS — J329 Chronic sinusitis, unspecified: Secondary | ICD-10-CM

## 2012-02-12 DIAGNOSIS — R05 Cough: Secondary | ICD-10-CM | POA: Diagnosis not present

## 2012-02-12 MED ORDER — MOMETASONE FUROATE 50 MCG/ACT NA SUSP: 2.0000 | Freq: Every day | NASAL | Status: DC

## 2012-02-12 MED ORDER — AMOXICILLIN-POT CLAVULANATE 875-125 MG PO TABS
1.0000 | ORAL_TABLET | Freq: Two times a day (BID) | ORAL | Status: AC
Start: 2012-02-12 — End: 2012-02-22

## 2012-02-12 MED ORDER — BENZONATATE 100 MG PO CAPS: 100.0000 mg | ORAL_CAPSULE | Freq: Two times a day (BID) | ORAL | Status: AC | PRN

## 2012-02-12 NOTE — Progress Notes (Signed)
  Subjective:     [name] is a 78 y.o. female who presents for evaluation of sinus pain. Symptoms include: congestion, facial pain, headaches, nasal congestion and sinus pressure. Onset of symptoms was several weeks ago. Symptoms have been gradually worsening since that time. Past history is significant for no history of pneumonia or bronchitis. Patient is a non-smoker. Pt was using allegra, and mucinex with no relief. The following portions of the patient's history were reviewed and updated as appropriate: allergies, current medications, past family history, past medical history, past social history, past surgical history and problem list.  Review of Systems Pertinent items are noted in HPI.   Objective:    BP 140/72  Pulse 86  Temp(Src) 99.4 F (37.4 C) (Oral)  Wt 186 lb 3.2 oz (84.46 kg)  SpO2 95% General appearance: alert, cooperative, appears stated age and mild distress Ears: normal TM's and external ear canals both ears Nose: green discharge, moderate congestion, turbinates red, swollen, grade -, edematous, sinus tenderness bilateral Throat: abnormal findings: p Neck: moderate anterior cervical adenopathy, supple, symmetrical, trachea midline and thyroid not enlarged, symmetric, no tenderness/mass/nodules Lungs: clear to auscultation bilaterally Heart: S1, S2 normal   Assessment:    Acute bacterial sinusitis.    Plan:    Nasal steroids per medication orders. Antihistamines per medication orders. Augmentin per medication orders. f/u prn

## 2012-02-12 NOTE — Patient Instructions (Signed)

## 2012-02-29 ENCOUNTER — Encounter: Payer: Self-pay | Admitting: Internal Medicine

## 2012-02-29 ENCOUNTER — Ambulatory Visit (INDEPENDENT_AMBULATORY_CARE_PROVIDER_SITE_OTHER): Payer: Medicare Other | Admitting: Internal Medicine

## 2012-02-29 VITALS — BP 118/72 | HR 74 | Resp 18 | Wt 168.1 lb

## 2012-02-29 DIAGNOSIS — Z950 Presence of cardiac pacemaker: Secondary | ICD-10-CM | POA: Diagnosis not present

## 2012-02-29 DIAGNOSIS — I495 Sick sinus syndrome: Secondary | ICD-10-CM | POA: Diagnosis not present

## 2012-02-29 LAB — PACEMAKER DEVICE OBSERVATION

## 2012-02-29 NOTE — Patient Instructions (Signed)
Your physician recommends that you schedule a follow-up appointment on 04/04/12   Will tentatively schedule your pacemaker generator change for 04/05/12.  You will have labs and get instructions on 04/04/12 when you come for office visit

## 2012-02-29 NOTE — Assessment & Plan Note (Signed)
Normal pacemaker function See Arita Miss Art report No changes today  Approaching ERI, Return in 1 month for battery check

## 2012-02-29 NOTE — Progress Notes (Signed)
PCP:  Marga Melnick, MD, MD  The patient presents today for routine electrophysiology followup.  Since last being seen in our clinic, the patient reports doing very well.  She remains active despite her age. Today, she denies symptoms of palpitations, chest pain, shortness of breath,  dizziness, presyncope, syncope, or neurologic sequela.  The patient feels that she is tolerating medications without difficulties and is otherwise without complaint today.   Past Medical History  Diagnosis Date  . Sliding hiatal hernia   . Erosive esophagitis   . Peptic stricture of esophagus   . Diverticulosis   . Arthritis   . Hypothyroidism   . Diabetes mellitus     Type I  . Coronary artery disease     s/p CABG 2001  . Sick sinus syndrome     s/p Medtronic DDD pacemaker implanted  . Hypertension   . Hyperlipidemia   . GERD (gastroesophageal reflux disease)   . Adenomatous colon polyp 07/2002  . CVA (cerebral vascular accident)    Past Surgical History  Procedure Date  . Pacemaker placement 03/25/2000    by Dr Juanda Chance  . Tonsillectomy and adenoidectomy   . Appendectomy   . Cholecystectomy   . Total abdominal hysterectomy w/ bilateral salpingoophorectomy     Endometiosis  . Replacement total knee 2004    Right  . Colonoscopy w/ polypectomy 2003  . Cataract extraction 05/2005  . Coronary artery bypass graft 02/2000    5 vessel    Current Outpatient Prescriptions  Medication Sig Dispense Refill  . bromocriptine (PARLODEL) 2.5 MG tablet Take 1 tablet (2.5 mg total) by mouth 2 (two) times daily.  180 tablet  3  . Calcium Carbonate (CALCIUM 500 PO) Take 500 mg by mouth once.        . Choline Fenofibrate (TRILIPIX) 135 MG capsule Take 1 capsule (135 mg total) by mouth daily.  90 capsule  3  . clopidogrel (PLAVIX) 75 MG tablet Take 1 tablet (75 mg total) by mouth daily.  90 tablet  0  . colesevelam (WELCHOL) 625 MG tablet 6 tabs daily  540 tablet  3  . digoxin (LANOXIN) 0.125 MG tablet Take 125  mcg by mouth. One daily except 1 1/2 tab every Wed       . enalapril (VASOTEC) 20 MG tablet Take 1 tablet (20 mg total) by mouth daily.  90 tablet  0  . glimepiride (AMARYL) 1 MG tablet Take 1 tablet (1 mg total) by mouth daily before breakfast.  90 tablet  3  . hydrochlorothiazide (HYDRODIURIL) 25 MG tablet Take 1 tablet (25 mg total) by mouth daily.  90 tablet  3  . levothyroxine (SYNTHROID) 25 MCG tablet Take 1 tablet (25 mcg total) by mouth daily.  90 tablet  3  . metoprolol (TOPROL-XL) 200 MG 24 hr tablet Take 1 tablet (200 mg total) by mouth daily.  90 tablet  0  . mometasone (NASONEX) 50 MCG/ACT nasal spray Place 2 sprays into the nose daily.  17 g  12  . niacin 500 MG tablet Take 500 mg by mouth daily with breakfast.        . nitroGLYCERIN (NITROSTAT) 0.3 MG SL tablet Place 0.3 mg under the tongue every 5 (five) minutes as needed.        . Omega-3 Fatty Acids (FISH OIL) 1000 MG CAPS Take 1,000 mg by mouth daily.        . pantoprazole (PROTONIX) 40 MG tablet Take 1 tablet (40 mg total)  by mouth daily.  90 tablet  3  . pravastatin (PRAVACHOL) 20 MG tablet TAKE ONE TABLET BY MOUTH AT BEDTIME **LABS DUE 02/2011**  90 tablet  3  . sitaGLIPtin (JANUVIA) 100 MG tablet Take 50 mg by mouth daily.         Current Facility-Administered Medications  Medication Dose Route Frequency Provider Last Rate Last Dose  . 0.9 %  sodium chloride infusion  500 mL Intravenous Continuous Meryl Dare, MD,FACG        Allergies  Allergen Reactions  . Codeine     Rash     History   Social History  . Marital Status: Widowed    Spouse Name: N/A    Number of Children: 1  . Years of Education: N/A   Occupational History  . Retired    Social History Main Topics  . Smoking status: Former Games developer  . Smokeless tobacco: Never Used   Comment: quit 28+ yrs ago (as of 2012)  . Alcohol Use: No  . Drug Use: No  . Sexually Active: Not on file   Other Topics Concern  . Not on file   Social History  Narrative   Lives with her grandson and his wife.Daily caffeine use.    Family History  Problem Relation Age of Onset  . Colon cancer Mother 75  . Breast cancer Mother   . Colon cancer Brother 46  . Breast cancer Sister   . Prostate cancer Brother     Physical Exam: Filed Vitals:   02/29/12 1438  BP: 118/72  Pulse: 74  Resp: 18  Weight: 168 lb 1.9 oz (76.259 kg)    GEN- The patient is well appearing, alert and oriented x 3 today.   Head- normocephalic, atraumatic Eyes-  Sclera clear, conjunctiva pink Ears- hearing intact Oropharynx- clear Neck- supple, no JVP R sided pacemaker is well healed Lungs- Clear to ausculation bilaterally, normal work of breathing Chest- pacemaker pocket is well healed Heart- Regular rate and rhythm, no murmurs, rubs or gallops, PMI not laterally displaced GI- soft, NT, ND, + BS Extremities- no clubbing, cyanosis, or edema  Pacemaker interrogation- reviewed in detail today,  See PACEART report  Assessment and Plan:

## 2012-03-02 ENCOUNTER — Other Ambulatory Visit: Payer: Self-pay | Admitting: Internal Medicine

## 2012-03-02 MED ORDER — CLOPIDOGREL BISULFATE 75 MG PO TABS: 75.0000 mg | ORAL_TABLET | Freq: Every day | ORAL | Status: DC

## 2012-03-12 ENCOUNTER — Emergency Department (HOSPITAL_COMMUNITY)
Admission: EM | Admit: 2012-03-12 | Discharge: 2012-03-12 | Disposition: A | Discharge status: Home / Self Care | Payer: Medicare Other | Attending: Emergency Medicine | Admitting: Emergency Medicine

## 2012-03-12 ENCOUNTER — Encounter (HOSPITAL_COMMUNITY): Payer: Self-pay

## 2012-03-12 ENCOUNTER — Emergency Department (HOSPITAL_COMMUNITY): Payer: Medicare Other

## 2012-03-12 DIAGNOSIS — R0609 Other forms of dyspnea: Secondary | ICD-10-CM | POA: Insufficient documentation

## 2012-03-12 DIAGNOSIS — M129 Arthropathy, unspecified: Secondary | ICD-10-CM | POA: Diagnosis not present

## 2012-03-12 DIAGNOSIS — R0602 Shortness of breath: Secondary | ICD-10-CM | POA: Insufficient documentation

## 2012-03-12 DIAGNOSIS — I10 Essential (primary) hypertension: Secondary | ICD-10-CM | POA: Insufficient documentation

## 2012-03-12 DIAGNOSIS — I509 Heart failure, unspecified: Secondary | ICD-10-CM | POA: Diagnosis not present

## 2012-03-12 DIAGNOSIS — M7989 Other specified soft tissue disorders: Secondary | ICD-10-CM | POA: Diagnosis not present

## 2012-03-12 DIAGNOSIS — Z8673 Personal history of transient ischemic attack (TIA), and cerebral infarction without residual deficits: Secondary | ICD-10-CM | POA: Insufficient documentation

## 2012-03-12 DIAGNOSIS — R609 Edema, unspecified: Secondary | ICD-10-CM | POA: Diagnosis not present

## 2012-03-12 DIAGNOSIS — E109 Type 1 diabetes mellitus without complications: Secondary | ICD-10-CM | POA: Insufficient documentation

## 2012-03-12 DIAGNOSIS — E039 Hypothyroidism, unspecified: Secondary | ICD-10-CM | POA: Diagnosis not present

## 2012-03-12 DIAGNOSIS — Z79899 Other long term (current) drug therapy: Secondary | ICD-10-CM | POA: Diagnosis not present

## 2012-03-12 DIAGNOSIS — R0989 Other specified symptoms and signs involving the circulatory and respiratory systems: Secondary | ICD-10-CM | POA: Diagnosis not present

## 2012-03-12 DIAGNOSIS — I2510 Atherosclerotic heart disease of native coronary artery without angina pectoris: Secondary | ICD-10-CM | POA: Diagnosis not present

## 2012-03-12 DIAGNOSIS — K219 Gastro-esophageal reflux disease without esophagitis: Secondary | ICD-10-CM | POA: Insufficient documentation

## 2012-03-12 DIAGNOSIS — E785 Hyperlipidemia, unspecified: Secondary | ICD-10-CM | POA: Diagnosis not present

## 2012-03-12 DIAGNOSIS — Z950 Presence of cardiac pacemaker: Secondary | ICD-10-CM | POA: Insufficient documentation

## 2012-03-12 LAB — BASIC METABOLIC PANEL
BUN: 33 mg/dL — ABNORMAL HIGH (ref 6–23)
Creatinine, Ser: 1.38 mg/dL — ABNORMAL HIGH (ref 0.50–1.10)
GFR calc Af Amer: 39 mL/min — ABNORMAL LOW (ref >90)
GFR calc non Af Amer: 34 mL/min — ABNORMAL LOW (ref >90)
Potassium: 4.2 mEq/L (ref 3.5–5.1)

## 2012-03-12 LAB — DIFFERENTIAL
Basophils Relative: 0 % (ref 0–1)
Eosinophils Absolute: 0.3 10*3/uL (ref 0.0–0.7)
Monocytes Absolute: 0.5 10*3/uL (ref 0.1–1.0)
Monocytes Relative: 7 % (ref 3–12)
Neutrophils Relative %: 53 % (ref 43–77)

## 2012-03-12 LAB — CARDIAC PANEL(CRET KIN+CKTOT+MB+TROPI)
CK, MB: 4.1 ng/mL — ABNORMAL HIGH (ref 0.3–4.0)
Relative Index: INVALID (ref 0.0–2.5)
Total CK: 96 U/L (ref 7–177)
Troponin I: <0.3 ng/mL (ref <0.30)

## 2012-03-12 LAB — CBC
Hemoglobin: 11.2 g/dL — ABNORMAL LOW (ref 12.0–15.0)
MCH: 28.7 pg (ref 26.0–34.0)
MCHC: 33.6 g/dL (ref 30.0–36.0)

## 2012-03-12 MED ORDER — TECHNETIUM TO 99M ALBUMIN AGGREGATED
3.0000 | Freq: Once | INTRAVENOUS | Status: AC | PRN
  Administered 2012-03-12: 3 via INTRAVENOUS

## 2012-03-12 MED ORDER — FUROSEMIDE 40 MG PO TABS: 40.0000 mg | ORAL_TABLET | Freq: Every day | ORAL | Status: DC

## 2012-03-12 NOTE — Progress Notes (Signed)
9:50 PM Long wait for result of V/Q scan.  Finally called and was advised the V/Q scan was low prob for pulmonary embolism.  Reexam shows faint rales at left base, 2+ pedal edema bilaterally.  Will treat for mild CHF by changing her from HCTZ to Lasix 40 mg po QAM.  Recommended followup with Dr. Johney Frame, her cardiolgist, next week.

## 2012-03-12 NOTE — ED Notes (Signed)
Pt ambulated to bathroom with assistance of family member.

## 2012-03-12 NOTE — ED Notes (Signed)
CDU not available to take pt at this time

## 2012-03-12 NOTE — ED Notes (Signed)
Pt was brought in by EMS with c/o SOB onset after she put on her shoes worse with exertion. Pt also c/o chest tightness. Pt denies any cough, fever.

## 2012-03-12 NOTE — Discharge Instructions (Signed)
[name], your tests show that you have a mild case of congestive heart failure.  You will need to stop the diuretic medicine hydrochlorothiazide, and start the medicine furosemide, a stronger diuretic, tomorrow morning.  This will enable you to urinate out excess body fluid.  On Monday, call Dr. Jenel Lucks office to make a followup appointment to be checked for congestive heart failure.

## 2012-03-12 NOTE — ED Provider Notes (Signed)
History     CSN: [id number]  Arrival date & time 03/12/12  1355   First MD Initiated Contact with Patient 03/12/12 1412      Chief Complaint  Patient presents with  . Shortness of Breath    (Consider location/radiation/quality/duration/timing/severity/associated sxs/prior treatment) Patient is a 78 y.o. female presenting with shortness of breath. The history is provided by the patient.  Shortness of Breath  Associated symptoms include shortness of breath.  She was sitting down and when she got up at about 10 AM, she noted severe dyspnea. She denied chest pain, heaviness, tightness, or pressure. Family states that she felt like her clothes were too tight on her. Dyspnea is worse with exertion better with rest. She denies nausea, vomiting, diaphoresis. She has been treated for "the flu" over the last 3 weeks but is generally better at this point. She has chronic leg swelling which is unchanged. She does have a pacemaker and is scheduled for elective battery replacement in the next month. She's concerned that there is a problem with her pacemaker. Symptoms are described as severe.  Past Medical History  Diagnosis Date  . Sliding hiatal hernia   . Erosive esophagitis   . Peptic stricture of esophagus   . Diverticulosis   . Arthritis   . Hypothyroidism   . Diabetes mellitus     Type I  . Coronary artery disease     s/p CABG 2001  . Sick sinus syndrome     s/p Medtronic DDD pacemaker implanted  . Hypertension   . Hyperlipidemia   . GERD (gastroesophageal reflux disease)   . Adenomatous colon polyp 07/2002  . CVA (cerebral vascular accident)     Past Surgical History  Procedure Date  . Pacemaker placement 03/25/2000    by Dr Juanda Chance  . Tonsillectomy and adenoidectomy   . Appendectomy   . Cholecystectomy   . Total abdominal hysterectomy w/ bilateral salpingoophorectomy     Endometiosis  . Replacement total knee 2004    Right  . Colonoscopy w/ polypectomy 2003  . Cataract  extraction 05/2005  . Coronary artery bypass graft 02/2000    5 vessel    Family History  Problem Relation Age of Onset  . Colon cancer Mother 14  . Breast cancer Mother   . Colon cancer Brother 40  . Breast cancer Sister   . Prostate cancer Brother     History  Substance Use Topics  . Smoking status: Former Games developer  . Smokeless tobacco: Never Used   Comment: quit 28+ yrs ago (as of 2012)  . Alcohol Use: No    OB History    Grav Para Term Preterm Abortions TAB SAB Ect Mult Living                  Review of Systems  Respiratory: Positive for shortness of breath.   All other systems reviewed and are negative.    Allergies  Codeine  Home Medications   Current Outpatient Rx  Name Route Sig Dispense Refill  . BROMOCRIPTINE MESYLATE 2.5 MG PO TABS Oral Take 1 tablet (2.5 mg total) by mouth 2 (two) times daily. 180 tablet 3  . CALCIUM 500 PO Oral Take 500 mg by mouth once.      . CHOLINE FENOFIBRATE 135 MG PO CPDR Oral Take 1 capsule (135 mg total) by mouth daily. 90 capsule 3  . CLOPIDOGREL BISULFATE 75 MG PO TABS Oral Take 1 tablet (75 mg total) by mouth daily. 90 tablet  1  . COLESEVELAM HCL 625 MG PO TABS  6 tabs daily 540 tablet 3  . DIGOXIN 0.125 MG PO TABS Oral Take 125 mcg by mouth. One daily except 1 1/2 tab every Wed     . ENALAPRIL MALEATE 20 MG PO TABS Oral Take 1 tablet (20 mg total) by mouth daily. 90 tablet 0  . GLIMEPIRIDE 1 MG PO TABS Oral Take 1 tablet (1 mg total) by mouth daily before breakfast. 90 tablet 3  . HYDROCHLOROTHIAZIDE 25 MG PO TABS Oral Take 1 tablet (25 mg total) by mouth daily. 90 tablet 3  . LEVOTHYROXINE SODIUM 25 MCG PO TABS Oral Take 1 tablet (25 mcg total) by mouth daily. 90 tablet 3    [employer]  . METOPROLOL SUCCINATE ER 200 MG PO TB24 Oral Take 1 tablet (200 mg total) by mouth daily. 90 tablet 0  . MOMETASONE FUROATE 50 MCG/ACT NA SUSP Nasal Place 2 sprays into the nose daily. 17 g 12  . NIACIN 500 MG PO TABS Oral Take 500 mg  by mouth daily with breakfast.      . NITROGLYCERIN 0.3 MG SL SUBL Sublingual Place 0.3 mg under the tongue every 5 (five) minutes as needed.      Marland Kitchen FISH OIL 1000 MG PO CAPS Oral Take 1,000 mg by mouth daily.      Marland Kitchen PANTOPRAZOLE SODIUM 40 MG PO TBEC Oral Take 1 tablet (40 mg total) by mouth daily. 90 tablet 3  . PRAVASTATIN SODIUM 20 MG PO TABS  TAKE ONE TABLET BY MOUTH AT BEDTIME **LABS DUE 02/2011** 90 tablet 3  . SITAGLIPTIN PHOSPHATE 100 MG PO TABS Oral Take 50 mg by mouth daily.        BP 148/67  Pulse 65  Temp(Src) 98.2 F (36.8 C) (Oral)  Resp 18  Ht 5\' 5"  (1.651 m)  Wt 180 lb (81.647 kg)  BMI 29.95 kg/m2  SpO2 97%  Physical Exam  Nursing note and vitals reviewed.  78 year old female who is resting comfortably and in no acute distress. Vital signs are normal. Oxygen saturation is 97% which is normal. Head is normocephalic and atraumatic. PERRLA, EOMI. Oropharynx is clear. Neck is nontender and supple without adenopathy or JVD. Back is nontender and there is no presacral edema. Lungs are clear without rales, wheezes, or rhonchi. Heart has regular rate and rhythm without murmur. Abdomen is soft, flat, nontender without masses or hepatosplenomegaly. Extremities have 2-3+ edema without cyanosis. Full range of motion is present. Skin is warm and dry without rash. Neurologic: Mental status is normal, cranial nerves are intact, there no focal motor or sensory deficits.  ED Course  Procedures (including critical care time)  Results for orders placed during the hospital encounter of 03/12/12  CBC      Component Value Range   WBC 7.3  4.0 - 10.5 (K/uL)   RBC 3.90  3.87 - 5.11 (MIL/uL)   Hemoglobin 11.2 (*) 12.0 - 15.0 (g/dL)   HCT 40.9 (*) 81.1 - 46.0 (%)   MCV 85.4  78.0 - 100.0 (fL)   MCH 28.7  26.0 - 34.0 (pg)   MCHC 33.6  30.0 - 36.0 (g/dL)   RDW 91.4  78.2 - 95.6 (%)   Platelets 177  150 - 400 (K/uL)  DIFFERENTIAL      Component Value Range   Neutrophils Relative 53  43 - 77  (%)   Neutro Abs 3.8  1.7 - 7.7 (K/uL)   Lymphocytes Relative 37  12 - 46 (%)   Lymphs Abs 2.7  0.7 - 4.0 (K/uL)   Monocytes Relative 7  3 - 12 (%)   Monocytes Absolute 0.5  0.1 - 1.0 (K/uL)   Eosinophils Relative 3  0 - 5 (%)   Eosinophils Absolute 0.3  0.0 - 0.7 (K/uL)   Basophils Relative 0  0 - 1 (%)   Basophils Absolute 0.0  0.0 - 0.1 (K/uL)  BASIC METABOLIC PANEL      Component Value Range   Sodium 133 (*) 135 - 145 (mEq/L)   Potassium 4.2  3.5 - 5.1 (mEq/L)   Chloride 99  96 - 112 (mEq/L)   CO2 22  19 - 32 (mEq/L)   Glucose, Bld 196 (*) 70 - 99 (mg/dL)   BUN 33 (*) 6 - 23 (mg/dL)   Creatinine, Ser 1.61 (*) 0.50 - 1.10 (mg/dL)   Calcium 09.6  8.4 - 10.5 (mg/dL)   GFR calc non Af Amer 34 (*) >90 (mL/min)   GFR calc Af Amer 39 (*) >90 (mL/min)  PRO B NATRIURETIC PEPTIDE      Component Value Range   Pro B Natriuretic peptide (BNP) 623.2 (*) 0 - 450 (pg/mL)  CARDIAC PANEL(CRET KIN+CKTOT+MB+TROPI)      Component Value Range   Total CK 106  7 - 177 (U/L)   CK, MB 4.2 (*) 0.3 - 4.0 (ng/mL)   Troponin I <0.30  <0.30 (ng/mL)   Relative Index 4.0 (*) 0.0 - 2.5   D-DIMER, QUANTITATIVE      Component Value Range   D-Dimer, Quant 0.53 (*) 0.00 - 0.48 (ug/mL-FEU)   Dg Chest 2 View  03/12/2012  *RADIOLOGY REPORT*  Clinical Data: Productive cough for 3 weeks.  History of CABG, hypertension, diabetes, smoking.  CHEST - 2 VIEW  Comparison: 07/02/2008  Findings: The patient has right-sided transvenous pacemaker, leads to the right atrium, right ventricle.  The patient has had median sternotomy CABG.  Heart is mildly enlarged.  No edema.  There are no focal consolidations or pleural effusions.  There is mild perihilar bronchitic change.  Surgical clips are present in the right upper quadrant of the abdomen.  Degenerative changes are noted in the thoracic spine.  IMPRESSION:  1.  Mild cardiomegaly without pulmonary edema. 2.  Bronchitic changes without focal pulmonary abnormality.  Original  Report Authenticated By: Patterson Hammersmith, M.D.      Date: 03/12/2012  Rate: 65  Rhythm: Electronic ventricular pacing  QRS Axis: left  Intervals: normal  ST/T Wave abnormalities: normal  Conduction Disutrbances:none  Narrative Interpretation: Electronic ventricular paced rhythm. When compared with ECG of 07/04/2008, electronic ventricular pacing has replaced atrial paced rhythm.  Old EKG Reviewed: changes noted  Workup is positive for slight elevation in BNP, and slight elevation of d-dimer. Slight elevation of CK-MB is of uncertain significance. Her renal function has not adequate for her CT angiogram so she will be sent for a V./Q. scan. Cardiac markers will be repeated. If all testing is negative, anticipate sending her home with increased diuretic. Case will be signed out to Dr. Ignacia Palma.  No diagnosis found.    MDM  Acute dyspnea which seems most likely to be congestive heart failure. Cardiac markers will be obtained to evaluate for possible occult cardiac injury. D-dimer will be obtained to evaluate for possible pulmonary embolism and chest x-ray obtained.        Dione Booze, MD 03/12/12 [phone]

## 2012-03-14 MED ORDER — TECHNETIUM TO 99M ALBUMIN AGGREGATED
3.0000 | Freq: Once | INTRAVENOUS | Status: AC | PRN
  Administered 2012-03-12: 3 via INTRAVENOUS

## 2012-03-14 MED ORDER — XENON XE 133 GAS
20.0000 | GAS_FOR_INHALATION | Freq: Once | RESPIRATORY_TRACT | Status: AC | PRN
  Administered 2012-03-12: 20 via RESPIRATORY_TRACT

## 2012-03-16 ENCOUNTER — Encounter: Payer: Self-pay | Admitting: Internal Medicine

## 2012-03-16 ENCOUNTER — Encounter (HOSPITAL_COMMUNITY): Payer: Self-pay | Admitting: Pharmacy Technician

## 2012-03-16 ENCOUNTER — Encounter: Payer: Self-pay | Admitting: *Deleted

## 2012-03-16 ENCOUNTER — Other Ambulatory Visit: Payer: Self-pay | Admitting: *Deleted

## 2012-03-16 ENCOUNTER — Ambulatory Visit (INDEPENDENT_AMBULATORY_CARE_PROVIDER_SITE_OTHER): Payer: Medicare Other | Admitting: Internal Medicine

## 2012-03-16 VITALS — BP 140/78 | HR 66 | Resp 18 | Ht 65.0 in | Wt 193.1 lb

## 2012-03-16 DIAGNOSIS — I10 Essential (primary) hypertension: Secondary | ICD-10-CM

## 2012-03-16 DIAGNOSIS — I495 Sick sinus syndrome: Secondary | ICD-10-CM

## 2012-03-16 DIAGNOSIS — I5022 Chronic systolic (congestive) heart failure: Secondary | ICD-10-CM | POA: Diagnosis not present

## 2012-03-16 LAB — CBC WITH DIFFERENTIAL/PLATELET
Basophils Relative: 0.4 % (ref 0.0–3.0)
Eosinophils Relative: 1.9 % (ref 0.0–5.0)
HCT: 33.2 % — ABNORMAL LOW (ref 36.0–46.0)
Lymphs Abs: 2 10*3/uL (ref 0.7–4.0)
MCV: 89.4 fl (ref 78.0–100.0)
Monocytes Absolute: 0.6 10*3/uL (ref 0.1–1.0)
Monocytes Relative: 8.1 % (ref 3.0–12.0)
Neutrophils Relative %: 63.7 % (ref 43.0–77.0)
RBC: 3.71 Mil/uL — ABNORMAL LOW (ref 3.87–5.11)
WBC: 7.7 10*3/uL (ref 4.5–10.5)

## 2012-03-16 LAB — PACEMAKER DEVICE OBSERVATION: BRDY-0002RV: 65 {beats}/min

## 2012-03-16 LAB — BASIC METABOLIC PANEL
Chloride: 101 mEq/L (ref 96–112)
Potassium: 4 mEq/L (ref 3.5–5.1)

## 2012-03-16 NOTE — Assessment & Plan Note (Signed)
Stable No change required today  

## 2012-03-16 NOTE — Patient Instructions (Signed)
Pacer generator change on 03/18/12 at 4:30

## 2012-03-16 NOTE — Assessment & Plan Note (Signed)
She has converted to ERI battery status with her pacemaker and has thus lost AV synchrony with VVI pacing.  She is very symptomatic with this. Risks, benefits, and alternatives to PPM pulse generator replacement were discussed at length with the patient and her daughter.  They accept the risks and wish to proceed.  We will therefore plan to proceed with PPM generator change later this week.

## 2012-03-16 NOTE — Progress Notes (Signed)
PCP:  William Hopper, MD, MD  The patient presents today for electrophysiology followup.  Since last being seen in our clinic, she has reached ERI.  Upon reaching ERI 03/12/12, she abruptly developed shortness of breath for which she presented to [facility].  She was started on lasix.  Her exercise tolerance remains limited with conversion of her pacemaker to VVI.  Today, she denies symptoms of palpitations, chest pain, dizziness, presyncope, syncope, or neurologic sequela.  The patient feels that she is tolerating medications without difficulties and is otherwise without complaint today.   Past Medical History  Diagnosis Date  . Sliding hiatal hernia   . Erosive esophagitis   . Peptic stricture of esophagus   . Diverticulosis   . Arthritis   . Hypothyroidism   . Diabetes mellitus     Type I  . Coronary artery disease     s/p CABG 2001  . Sick sinus syndrome     s/p Medtronic DDD pacemaker implanted  . Hypertension   . Hyperlipidemia   . GERD (gastroesophageal reflux disease)   . Adenomatous colon polyp 07/2002  . CVA (cerebral vascular accident)    Past Surgical History  Procedure Date  . Pacemaker placement 03/25/2000    by Dr Brodie  . Tonsillectomy and adenoidectomy   . Appendectomy   . Cholecystectomy   . Total abdominal hysterectomy w/ bilateral salpingoophorectomy     Endometiosis  . Replacement total knee 2004    Right  . Colonoscopy w/ polypectomy 2003  . Cataract extraction 05/2005  . Coronary artery bypass graft 02/2000    5 vessel  . Rotator cuff repair     Current Outpatient Prescriptions  Medication Sig Dispense Refill  . Calcium Carbonate (CALCIUM 500 PO) Take 500 mg by mouth once.       . Choline Fenofibrate 135 MG capsule Take 135 mg by mouth daily.      . digoxin (LANOXIN) 0.125 MG tablet Take 125 mcg by mouth See admin instructions. One daily except 1 1/2 tab every Wed      . fish oil-omega-3 fatty acids 1000 MG capsule Take 1 g by mouth daily.      .  niacin 500 MG tablet Take 500 mg by mouth daily with breakfast.       . nitroGLYCERIN (NITROSTAT) 0.3 MG SL tablet Place 0.3 mg under the tongue every 5 (five) minutes as needed. For chest pain      . pravastatin (PRAVACHOL) 20 MG tablet Take 20 mg by mouth daily.      . sitaGLIPtin (JANUVIA) 100 MG tablet Take 50 mg by mouth daily.       . DISCONTD: bromocriptine (PARLODEL) 2.5 MG tablet Take 1 tablet (2.5 mg total) by mouth 2 (two) times daily.  180 tablet  3  . DISCONTD: clopidogrel (PLAVIX) 75 MG tablet Take 1 tablet (75 mg total) by mouth daily.  90 tablet  1  . DISCONTD: colesevelam (WELCHOL) 625 MG tablet 6 tabs daily  540 tablet  3  . DISCONTD: enalapril (VASOTEC) 20 MG tablet Take 1 tablet (20 mg total) by mouth daily.  90 tablet  0  . DISCONTD: furosemide (LASIX) 40 MG tablet Take 1 tablet (40 mg total) by mouth daily.  30 tablet  0  . DISCONTD: glimepiride (AMARYL) 1 MG tablet Take 1 tablet (1 mg total) by mouth daily before breakfast.  90 tablet  3  . DISCONTD: levothyroxine (SYNTHROID) 25 MCG tablet Take 1 tablet (25   mcg total) by mouth daily.  90 tablet  3  . DISCONTD: metoprolol (TOPROL-XL) 200 MG 24 hr tablet Take 1 tablet (200 mg total) by mouth daily.  90 tablet  0  . DISCONTD: mometasone (NASONEX) 50 MCG/ACT nasal spray Place 2 sprays into the nose daily.  17 g  12  . DISCONTD: pantoprazole (PROTONIX) 40 MG tablet Take 1 tablet (40 mg total) by mouth daily.  90 tablet  3  . bromocriptine (PARLODEL) 2.5 MG tablet Take 2.5 mg by mouth 2 (two) times daily.      . clopidogrel (PLAVIX) 75 MG tablet Take 75 mg by mouth daily.      . colesevelam (WELCHOL) 625 MG tablet Take 1,875 mg by mouth 2 (two) times daily with a meal.      . enalapril (VASOTEC) 20 MG tablet Take 20 mg by mouth daily.      . furosemide (LASIX) 40 MG tablet Take 40 mg by mouth daily.      . glimepiride (AMARYL) 1 MG tablet Take 1 mg by mouth daily before breakfast.      . levothyroxine (SYNTHROID, LEVOTHROID) 25  MCG tablet Take 25 mcg by mouth daily.      . meclizine (ANTIVERT) 25 MG tablet Take 25 mg by mouth 3 (three) times daily as needed. For dizziness      . metoprolol (TOPROL-XL) 200 MG 24 hr tablet Take 200 mg by mouth daily.      . mometasone (NASONEX) 50 MCG/ACT nasal spray Place 2 sprays into the nose daily.      . pantoprazole (PROTONIX) 40 MG tablet Take 40 mg by mouth daily.       Current Facility-Administered Medications  Medication Dose Route Frequency Provider Last Rate Last Dose  . 0.9 %  sodium chloride infusion  500 mL Intravenous Continuous Malcolm T Stark, MD,FACG        Allergies  Allergen Reactions  . Codeine     Rash     History   Social History  . Marital Status: Widowed    Spouse Name: N/A    Number of Children: 1  . Years of Education: N/A   Occupational History  . Retired    Social History Main Topics  . Smoking status: Former Smoker  . Smokeless tobacco: Never Used   Comment: quit 28+ yrs ago (as of 2012)  . Alcohol Use: No  . Drug Use: No  . Sexually Active: Not on file   Other Topics Concern  . Not on file   Social History Narrative   Lives with her grandson and his wife.Daily caffeine use.    Family History  Problem Relation Age of Onset  . Colon cancer Mother 88  . Breast cancer Mother   . Colon cancer Brother 76  . Breast cancer Sister   . Prostate cancer Brother     Physical Exam: Filed Vitals:   03/16/12 1024  BP: 140/78  Pulse: 66  Resp: 18  Height: 5' 5" (1.651 m)  Weight: 193 lb 1.9 oz (87.599 kg)    GEN- The patient is well appearing, alert and oriented x 3 today.   Head- normocephalic, atraumatic Eyes-  Sclera clear, conjunctiva pink Ears- hearing intact Oropharynx- clear Neck- supple, no JVP R sided pacemaker is well healed Lungs- Clear to ausculation bilaterally, normal work of breathing Chest- pacemaker pocket is well healed Heart- Regular rate and rhythm, no murmurs, rubs or gallops, PMI not laterally  displaced GI- soft, NT,   ND, + BS Extremities- no clubbing, cyanosis, +1 edema  Pacemaker interrogation- reviewed in detail today,  See PACEART report  Assessment and Plan:  

## 2012-03-16 NOTE — Assessment & Plan Note (Signed)
Worsened by VVI pacing as above. Proceed with generator change later this week

## 2012-03-17 ENCOUNTER — Other Ambulatory Visit: Payer: Self-pay | Admitting: *Deleted

## 2012-03-17 DIAGNOSIS — E785 Hyperlipidemia, unspecified: Secondary | ICD-10-CM | POA: Diagnosis not present

## 2012-03-17 DIAGNOSIS — I10 Essential (primary) hypertension: Secondary | ICD-10-CM | POA: Diagnosis not present

## 2012-03-17 DIAGNOSIS — E109 Type 1 diabetes mellitus without complications: Secondary | ICD-10-CM | POA: Diagnosis not present

## 2012-03-17 DIAGNOSIS — Z45018 Encounter for adjustment and management of other part of cardiac pacemaker: Secondary | ICD-10-CM | POA: Diagnosis not present

## 2012-03-17 DIAGNOSIS — I2510 Atherosclerotic heart disease of native coronary artery without angina pectoris: Secondary | ICD-10-CM | POA: Diagnosis not present

## 2012-03-17 DIAGNOSIS — Z8673 Personal history of transient ischemic attack (TIA), and cerebral infarction without residual deficits: Secondary | ICD-10-CM | POA: Diagnosis not present

## 2012-03-17 DIAGNOSIS — K219 Gastro-esophageal reflux disease without esophagitis: Secondary | ICD-10-CM | POA: Diagnosis not present

## 2012-03-17 MED ORDER — SODIUM CHLORIDE 0.9 % IR SOLN
80.0000 mg | Status: DC
  Filled 2012-03-17 (×2): qty 2

## 2012-03-17 MED ORDER — CEFAZOLIN SODIUM-DEXTROSE 2-3 GM-% IV SOLR
2.0000 g | INTRAVENOUS | Status: DC
  Filled 2012-03-17 (×2): qty 50

## 2012-03-17 MED ORDER — SODIUM CHLORIDE 0.9 % IV SOLN
INTRAVENOUS | Status: DC
  Administered 2012-03-18: 06:00:00 via INTRAVENOUS

## 2012-03-17 MED ORDER — CHLORHEXIDINE GLUCONATE 4 % EX LIQD
60.0000 mL | Freq: Once | CUTANEOUS | Status: DC
  Filled 2012-03-17: qty 60

## 2012-03-17 MED ORDER — METOPROLOL SUCCINATE ER 200 MG PO TB24: 200.0000 mg | ORAL_TABLET | Freq: Every day | ORAL | Status: DC

## 2012-03-17 NOTE — Telephone Encounter (Signed)
Refilled toprol

## 2012-03-18 ENCOUNTER — Encounter (HOSPITAL_COMMUNITY)
Admission: RE | Disposition: A | Discharge status: Home / Self Care | Payer: Self-pay | Source: Ambulatory Visit | Attending: Internal Medicine

## 2012-03-18 ENCOUNTER — Ambulatory Visit (HOSPITAL_COMMUNITY)
Admission: RE | Admit: 2012-03-18 | Discharge: 2012-03-18 | Disposition: A | Discharge status: Home / Self Care | Payer: Medicare Other | Source: Ambulatory Visit | Attending: Internal Medicine | Admitting: Internal Medicine

## 2012-03-18 DIAGNOSIS — Z45018 Encounter for adjustment and management of other part of cardiac pacemaker: Secondary | ICD-10-CM | POA: Insufficient documentation

## 2012-03-18 DIAGNOSIS — I2510 Atherosclerotic heart disease of native coronary artery without angina pectoris: Secondary | ICD-10-CM | POA: Insufficient documentation

## 2012-03-18 DIAGNOSIS — Z8673 Personal history of transient ischemic attack (TIA), and cerebral infarction without residual deficits: Secondary | ICD-10-CM | POA: Insufficient documentation

## 2012-03-18 DIAGNOSIS — I10 Essential (primary) hypertension: Secondary | ICD-10-CM | POA: Insufficient documentation

## 2012-03-18 DIAGNOSIS — K219 Gastro-esophageal reflux disease without esophagitis: Secondary | ICD-10-CM | POA: Insufficient documentation

## 2012-03-18 DIAGNOSIS — I495 Sick sinus syndrome: Secondary | ICD-10-CM

## 2012-03-18 DIAGNOSIS — E109 Type 1 diabetes mellitus without complications: Secondary | ICD-10-CM | POA: Insufficient documentation

## 2012-03-18 DIAGNOSIS — I5022 Chronic systolic (congestive) heart failure: Secondary | ICD-10-CM | POA: Insufficient documentation

## 2012-03-18 DIAGNOSIS — E785 Hyperlipidemia, unspecified: Secondary | ICD-10-CM | POA: Insufficient documentation

## 2012-03-18 HISTORY — PX: PERMANENT PACEMAKER GENERATOR CHANGE: SHX6022

## 2012-03-18 LAB — GLUCOSE, CAPILLARY
Glucose-Capillary: 186 mg/dL — ABNORMAL HIGH (ref 70–99)
Glucose-Capillary: 177 mg/dL — ABNORMAL HIGH (ref 70–99)

## 2012-03-18 LAB — SURGICAL PCR SCREEN: MRSA, PCR: NEGATIVE

## 2012-03-18 SURGERY — PERMANENT PACEMAKER GENERATOR CHANGE

## 2012-03-18 MED ORDER — HYDROCODONE-ACETAMINOPHEN 5-325 MG PO TABS: 1.0000 | ORAL_TABLET | ORAL | Status: DC | PRN

## 2012-03-18 MED ORDER — MUPIROCIN 2 % EX OINT
TOPICAL_OINTMENT | CUTANEOUS | Status: AC
  Administered 2012-03-18: 1 via NASAL
  Filled 2012-03-18: qty 22

## 2012-03-18 MED ORDER — SODIUM CHLORIDE 0.9 % IJ SOLN: 3.0000 mL | INTRAMUSCULAR | Status: DC | PRN

## 2012-03-18 MED ORDER — ACETAMINOPHEN 325 MG PO TABS: 325.0000 mg | ORAL_TABLET | ORAL | Status: DC | PRN

## 2012-03-18 MED ORDER — SODIUM CHLORIDE 0.9 % IV SOLN: 250.0000 mL | INTRAVENOUS | Status: DC | PRN

## 2012-03-18 MED ORDER — ONDANSETRON HCL 4 MG/2ML IJ SOLN: 4.0000 mg | Freq: Four times a day (QID) | INTRAMUSCULAR | Status: DC | PRN

## 2012-03-18 MED ORDER — SODIUM CHLORIDE 0.9 % IJ SOLN: 3.0000 mL | Freq: Two times a day (BID) | INTRAMUSCULAR | Status: DC

## 2012-03-18 MED ORDER — LIDOCAINE HCL (PF) 1 % IJ SOLN
INTRAMUSCULAR | Status: AC
  Filled 2012-03-18: qty 60

## 2012-03-18 NOTE — Discharge Instructions (Signed)
Pacemaker Battery Change A pacemaker battery usually lasts 4 to 12 years. Once or twice per year, you will be asked to visit your caregiver to have a full evaluation of your pacemaker. When a battery needs to be replaced, the entire pacemaker is actually replaced so that you can benefit from new circuitry and any new features that have recently been added to pacemakers. Most often, this procedure is very simple because the leads are already in place. After giving medicine to numb the skin, your health care provider makes a cut to reopen the pocket holding the pacemaker and disconnects the old device from its leads. The leads are routinely tested at this time. If they are working okay, the new pacemaker may simply be connected to the existing leads. If there is any problem with the old lead system, it may be wise to replace the lead system while inserting the new pacemaker. There are many things that affect how long a pacemaker battery will last:  Age of the pacemaker.   Number of leads (1, 2 or 3).   Pacemaker work load. If the pacemaker is helping the heart more often, then the battery will not last as long as if the pacemaker does not need to help the heart.   Resistance of the leads. The greater the resistance, the greater the drain on the battery. This can increase as the leads get older or if one or more of the leads does not have the best contact with the heart.   Power (voltage) settings.   The health of the person's heart. If the health of the heart gets worse, then the pacemaker may have to work more often and the setting changed to accommodate these changes.  Your health care provider will be alerted to the fact that it is time to replace the battery during follow-up exams. He or she will check your pacemaker using a small table-top computer, called a programmer, and a wand. The wand is about the same size as a remote control. Your provider puts the wand on your body in the area where the  pacemaker is located. Information from the pacemaker is received about how well your heart is working and the status of the battery. It is not painful, and it usually takes just a few minutes. You will have plenty of time before the battery is fully used up to plan for replacement.  LET YOUR CAREGIVER KNOW ABOUT:   Symptoms of chest pain, trouble breathing, palpitations, lightheadedness, or feelings of an abnormal or irregular heart beat.   Allergies.   Medications taken including herbs, eye drops, over the counter medications, and creams   Use of steroids (by mouth or creams).   Possible pregnancy, if applicable.   Previous problems with anesthetics or Novocaine.   History of blood clots (thrombophlebitis).   History of bleeding or blood problems.   Surgery since your last pacemaker placement.   Other health problems.  RISKS AND COMPLICATIONS These are very uncommon but include:  Bleeding.   Bruising of the skin around where the incision was made.   Pain at the site of the incision.   Pulling apart of the skin at the incision site.   Infection.   Allergic reaction to anesthetics or medicines used during the procedure.  Diabetics may have a temporary increase in their blood sugar after any surgical procedure.  BEFORE THE PROCEDURE  Wash all of the skin around the area of the chest where the pacemaker is located.   Try to remove any loose, scaling skin. Unless advised otherwise, avoid using aspirin, ibuprofen, or naproxen for 3-4 days before the procedure. Ask your caregiver for help with any other medication adjustments before the pacemaker is replaced. Unless advised otherwise, do not eat or drink after midnight on the night before the procedure EXCEPT for drinking water and taking your medications as you normally would. AFTER THE PROCEDURE   A heart monitor and the pacemaker programmer will be used to make sure that the new pacemaker is working properly.   You can go home  after the procedure.   Your caregiver will advise you if you need to have any stitches. They will be removed 5-7 days after the procedure.  HOME CARE INSTRUCTIONS   Keep the incision clean and dry.   Unless advised otherwise, you may shower after carefully covering the incision with plastic wrap that is taped to your chest.   For the first week after the replacement, avoid stretching motions that pull at the incision site and avoid heavy exercise with the arm on the same side as the incision.   Only take over-the-counter or prescription medicines for pain, discomfort, or fever as directed by your caregiver.   Your caregiver will tell you when you will need to next test your pacemaker by telephone or when to return to the office for re-exam and/or removal of stitches, if necessary.  SEEK MEDICAL CARE IF:   You have unusual pain at the incision site that is not adequately helped by over-the-counter or prescription medicine.   There is drainage or pus from the incision site.   You develop red streaking that extends above or below the incision site.   You feel brief intermittent palpitations, lightheadedness or any symptoms that you feel might be related to your heart.  SEEK IMMEDIATE MEDICAL CARE IF:   You experience chest pain that is different than the pain at the incision site.   You experience:   Shortness of breath.   Palpitations.   Irregular heart beat.   Lightheadedness that does not go away quickly.   Fainting.   You develop a fever.   You have pain that gets worse even though you are taking pain medicine.  MAKE SURE YOU:   Understand these instructions.   Will watch your condition.   Will get help right away if you are not doing well or get worse.  Document Released: 02/17/2007 Document Revised: 10/29/2011 Document Reviewed: 05/23/2007 [employer] Patient Information 2012 [employer]. 

## 2012-03-18 NOTE — Op Note (Signed)
SURGEON:  Hillis Range, MD     PREPROCEDURE DIAGNOSES:   1. Sick sinus syndrome.   2. Pacemaker at elective replacement indicator    POSTPROCEDURE DIAGNOSES:   1. Sick sinus syndrome.   2. Pacemaker at elective replacement indicator    PROCEDURES:   1. Pacemaker pulse generator replacement.   2. Skin pocket revision.     INTRODUCTION:  [name] is a 78 y.o. female with a history of sick sinus syndrome. She has done well since his pacemaker was implanted.  She has recently reached ERI battery status.  She presents today for pacemaker pulse generator replacement.       DESCRIPTION OF THE PROCEDURE:  Informed written consent was obtained, and the patient was brought to the electrophysiology lab in the fasting state.  The patient's pacemaker was interrogated today and found to be at elective replacement indicator battery status.  The patient was adequately sedated with intravenous Versed as outlined in the nursing report.  The patient's right chest was prepped and draped in the usual sterile fashion by the EP lab staff.  The skin overlying the existing pacemaker was infiltrated with lidocaine for local analgesia.  A 4-cm incision was made over the pacemaker pocket.  Using a combination of sharp and blunt dissection, the pacemaker was exposed and removed from the body.  The device was disconnected from the leads There was no foreign matter or debris within the pocket.  The atrial lead was confirmed to be a Medtronic model [id number] (serial number [id number]) lead implanted on 03/25/2000.  The right ventricular lead was confirmed to be a Medtronic model [id number] (serial number [id number]) lead implanted on the same date as the atrial lead (above).  Both leads were examined and their integrity was confirmed to be intact.  Atrial lead P-waves measured 4 mV with impedance of 421 ohms and a threshold of 0.6 V at 0.5 msec.  Right ventricular lead R-waves measured 19 mV with impedance of 880 ohms and a  threshold of 0.8 V at 0.5 msec.  Both leads were connected to a Medtronic adapt L model ADDDR 1 (serial number [id number]) pacemaker.  The pocket was revised to accommodate this new device.  Electrocautery was required to assure hemostasis.  The pocket was irrigated with copious gentamicin solution. The pacemaker was then placed into the pocket.  The pocket was then closed in 2 layers with 2-0 Vicryl suture over the subcutaneous and subcuticular layers.  Steri-Strips and a sterile dressing were then applied.  There were no early apparent complications.     CONCLUSIONS:   1. Successful pacemaker pulse generator replacement for elective replacement indicator battery status   2. No early apparent complications.     Hillis Range, MD 03/18/2012 8:29 AM

## 2012-03-18 NOTE — H&P (View-Only) (Signed)
PCP:  Marga Melnick, MD, MD  The patient presents today for electrophysiology followup.  Since last being seen in our clinic, she has reached ERI.  Upon reaching ERI 03/12/12, she abruptly developed shortness of breath for which she presented to [facility] ER.  She was started on lasix.  Her exercise tolerance remains limited with conversion of her pacemaker to VVI.  Today, she denies symptoms of palpitations, chest pain, dizziness, presyncope, syncope, or neurologic sequela.  The patient feels that she is tolerating medications without difficulties and is otherwise without complaint today.   Past Medical History  Diagnosis Date  . Sliding hiatal hernia   . Erosive esophagitis   . Peptic stricture of esophagus   . Diverticulosis   . Arthritis   . Hypothyroidism   . Diabetes mellitus     Type I  . Coronary artery disease     s/p CABG 2001  . Sick sinus syndrome     s/p Medtronic DDD pacemaker implanted  . Hypertension   . Hyperlipidemia   . GERD (gastroesophageal reflux disease)   . Adenomatous colon polyp 07/2002  . CVA (cerebral vascular accident)    Past Surgical History  Procedure Date  . Pacemaker placement 03/25/2000    by Dr Juanda Chance  . Tonsillectomy and adenoidectomy   . Appendectomy   . Cholecystectomy   . Total abdominal hysterectomy w/ bilateral salpingoophorectomy     Endometiosis  . Replacement total knee 2004    Right  . Colonoscopy w/ polypectomy 2003  . Cataract extraction 05/2005  . Coronary artery bypass graft 02/2000    5 vessel  . Rotator cuff repair     Current Outpatient Prescriptions  Medication Sig Dispense Refill  . Calcium Carbonate (CALCIUM 500 PO) Take 500 mg by mouth once.       . Choline Fenofibrate 135 MG capsule Take 135 mg by mouth daily.      . digoxin (LANOXIN) 0.125 MG tablet Take 125 mcg by mouth See admin instructions. One daily except 1 1/2 tab every Wed      . fish oil-omega-3 fatty acids 1000 MG capsule Take 1 g by mouth daily.      .  niacin 500 MG tablet Take 500 mg by mouth daily with breakfast.       . nitroGLYCERIN (NITROSTAT) 0.3 MG SL tablet Place 0.3 mg under the tongue every 5 (five) minutes as needed. For chest pain      . pravastatin (PRAVACHOL) 20 MG tablet Take 20 mg by mouth daily.      . sitaGLIPtin (JANUVIA) 100 MG tablet Take 50 mg by mouth daily.       Marland Kitchen DISCONTD: bromocriptine (PARLODEL) 2.5 MG tablet Take 1 tablet (2.5 mg total) by mouth 2 (two) times daily.  180 tablet  3  . DISCONTD: clopidogrel (PLAVIX) 75 MG tablet Take 1 tablet (75 mg total) by mouth daily.  90 tablet  1  . DISCONTD: colesevelam (WELCHOL) 625 MG tablet 6 tabs daily  540 tablet  3  . DISCONTD: enalapril (VASOTEC) 20 MG tablet Take 1 tablet (20 mg total) by mouth daily.  90 tablet  0  . DISCONTD: furosemide (LASIX) 40 MG tablet Take 1 tablet (40 mg total) by mouth daily.  30 tablet  0  . DISCONTD: glimepiride (AMARYL) 1 MG tablet Take 1 tablet (1 mg total) by mouth daily before breakfast.  90 tablet  3  . DISCONTD: levothyroxine (SYNTHROID) 25 MCG tablet Take 1 tablet (25  mcg total) by mouth daily.  90 tablet  3  . DISCONTD: metoprolol (TOPROL-XL) 200 MG 24 hr tablet Take 1 tablet (200 mg total) by mouth daily.  90 tablet  0  . DISCONTD: mometasone (NASONEX) 50 MCG/ACT nasal spray Place 2 sprays into the nose daily.  17 g  12  . DISCONTD: pantoprazole (PROTONIX) 40 MG tablet Take 1 tablet (40 mg total) by mouth daily.  90 tablet  3  . bromocriptine (PARLODEL) 2.5 MG tablet Take 2.5 mg by mouth 2 (two) times daily.      . clopidogrel (PLAVIX) 75 MG tablet Take 75 mg by mouth daily.      . colesevelam (WELCHOL) 625 MG tablet Take 1,875 mg by mouth 2 (two) times daily with a meal.      . enalapril (VASOTEC) 20 MG tablet Take 20 mg by mouth daily.      . furosemide (LASIX) 40 MG tablet Take 40 mg by mouth daily.      Marland Kitchen glimepiride (AMARYL) 1 MG tablet Take 1 mg by mouth daily before breakfast.      . levothyroxine (SYNTHROID, LEVOTHROID) 25  MCG tablet Take 25 mcg by mouth daily.      . meclizine (ANTIVERT) 25 MG tablet Take 25 mg by mouth 3 (three) times daily as needed. For dizziness      . metoprolol (TOPROL-XL) 200 MG 24 hr tablet Take 200 mg by mouth daily.      . mometasone (NASONEX) 50 MCG/ACT nasal spray Place 2 sprays into the nose daily.      . pantoprazole (PROTONIX) 40 MG tablet Take 40 mg by mouth daily.       Current Facility-Administered Medications  Medication Dose Route Frequency Provider Last Rate Last Dose  . 0.9 %  sodium chloride infusion  500 mL Intravenous Continuous Meryl Dare, MD,FACG        Allergies  Allergen Reactions  . Codeine     Rash     History   Social History  . Marital Status: Widowed    Spouse Name: N/A    Number of Children: 1  . Years of Education: N/A   Occupational History  . Retired    Social History Main Topics  . Smoking status: Former Games developer  . Smokeless tobacco: Never Used   Comment: quit 28+ yrs ago (as of 2012)  . Alcohol Use: No  . Drug Use: No  . Sexually Active: Not on file   Other Topics Concern  . Not on file   Social History Narrative   Lives with her grandson and his wife.Daily caffeine use.    Family History  Problem Relation Age of Onset  . Colon cancer Mother 33  . Breast cancer Mother   . Colon cancer Brother 6  . Breast cancer Sister   . Prostate cancer Brother     Physical Exam: Filed Vitals:   03/16/12 1024  BP: 140/78  Pulse: 66  Resp: 18  Height: 5\' 5"  (1.651 m)  Weight: 193 lb 1.9 oz (87.599 kg)    GEN- The patient is well appearing, alert and oriented x 3 today.   Head- normocephalic, atraumatic Eyes-  Sclera clear, conjunctiva pink Ears- hearing intact Oropharynx- clear Neck- supple, no JVP R sided pacemaker is well healed Lungs- Clear to ausculation bilaterally, normal work of breathing Chest- pacemaker pocket is well healed Heart- Regular rate and rhythm, no murmurs, rubs or gallops, PMI not laterally  displaced GI- soft, NT,  ND, + BS Extremities- no clubbing, cyanosis, +1 edema  Pacemaker interrogation- reviewed in detail today,  See PACEART report  Assessment and Plan:

## 2012-03-18 NOTE — Interval H&P Note (Signed)
History and Physical Interval Note:  03/18/2012 7:21 AM  Lynnell L Gorelick  has presented today for surgery, with the diagnosis of chf/eri  The various methods of treatment have been discussed with the patient and family. After consideration of risks, benefits and other options for treatment, the patient has consented to  Procedure(s) (LRB): PERMANENT PACEMAKER INSERTION (N/A) as a surgical intervention .  The patients' history has been reviewed, patient examined, no change in status, stable for surgery.  I have reviewed the patients' chart and labs.  Questions were answered to the patient's satisfaction.     Hillis Range

## 2012-03-23 ENCOUNTER — Other Ambulatory Visit: Payer: Self-pay | Admitting: *Deleted

## 2012-03-23 MED ORDER — ENALAPRIL MALEATE 20 MG PO TABS: 20.0000 mg | ORAL_TABLET | Freq: Every day | ORAL | Status: DC

## 2012-03-25 ENCOUNTER — Encounter: Payer: Self-pay | Admitting: Internal Medicine

## 2012-03-25 ENCOUNTER — Other Ambulatory Visit: Payer: Medicare Other

## 2012-03-25 ENCOUNTER — Ambulatory Visit (INDEPENDENT_AMBULATORY_CARE_PROVIDER_SITE_OTHER): Payer: Medicare Other | Admitting: Nurse Practitioner

## 2012-03-25 ENCOUNTER — Encounter: Payer: Self-pay | Admitting: Nurse Practitioner

## 2012-03-25 ENCOUNTER — Ambulatory Visit (INDEPENDENT_AMBULATORY_CARE_PROVIDER_SITE_OTHER): Payer: Medicare Other | Admitting: *Deleted

## 2012-03-25 VITALS — BP 138/68 | HR 82 | Ht 66.0 in | Wt 196.4 lb

## 2012-03-25 DIAGNOSIS — I5022 Chronic systolic (congestive) heart failure: Secondary | ICD-10-CM

## 2012-03-25 DIAGNOSIS — I495 Sick sinus syndrome: Secondary | ICD-10-CM

## 2012-03-25 DIAGNOSIS — I10 Essential (primary) hypertension: Secondary | ICD-10-CM

## 2012-03-25 LAB — PACEMAKER DEVICE OBSERVATION
AL IMPEDENCE PM: 403 Ohm
ATRIAL PACING PM: 99
BATTERY VOLTAGE: 2.79 V
RV LEAD IMPEDENCE PM: 644 Ohm
RV LEAD THRESHOLD: 1 V

## 2012-03-25 NOTE — Progress Notes (Signed)
[name] Date of Birth: 09/05/1925 Medical Record #[id number]  History of Present Illness: [name] is seen back today for a post pacemaker generator visit. She is seen for Dr. Johney Frame. She has a known history of CAD, remote CABG, prior CVA, CRI, DM, HTN, HLD, and sick sinus syndrome.   She comes in today. She is here alone. She is doing well. Feels much better with her new generator. She had lost her AV synchrony and was quite symptomatic. She says she is no longer short of breath. She is back on her regular medicines. She has her regular follow up with Dr. Everardo All next month. No chest pain. Not short of breath. Her pacemaker site is a little sore but overall, she is doing well. She is anxious to resume driving.   Current Outpatient Prescriptions on File Prior to Visit  Medication Sig Dispense Refill  . bromocriptine (PARLODEL) 2.5 MG tablet Take 2.5 mg by mouth 2 (two) times daily.      . Calcium Carbonate (CALCIUM 500 PO) Take 500 mg by mouth once.       . Choline Fenofibrate 135 MG capsule Take 135 mg by mouth daily.      . clopidogrel (PLAVIX) 75 MG tablet Take 75 mg by mouth daily.      . colesevelam (WELCHOL) 625 MG tablet Take 1,875 mg by mouth 2 (two) times daily with a meal.      . digoxin (LANOXIN) 0.125 MG tablet Take 125 mcg by mouth See admin instructions. One daily except 1 1/2 tab every Wed      . enalapril (VASOTEC) 20 MG tablet Take 1 tablet (20 mg total) by mouth daily.  90 tablet  1  . fish oil-omega-3 fatty acids 1000 MG capsule Take 1 g by mouth daily.      Marland Kitchen glimepiride (AMARYL) 1 MG tablet Take 1 mg by mouth daily before breakfast.      . levothyroxine (SYNTHROID, LEVOTHROID) 25 MCG tablet Take 25 mcg by mouth daily.      . meclizine (ANTIVERT) 25 MG tablet Take 25 mg by mouth 3 (three) times daily as needed. For dizziness      . metoprolol (TOPROL-XL) 200 MG 24 hr tablet Take 1 tablet (200 mg total) by mouth daily.  90 tablet  3  . niacin 500 MG tablet Take 500  mg by mouth daily with breakfast.       . nitroGLYCERIN (NITROSTAT) 0.3 MG SL tablet Place 0.3 mg under the tongue every 5 (five) minutes as needed. For chest pain      . pantoprazole (PROTONIX) 40 MG tablet Take 40 mg by mouth daily.      . pravastatin (PRAVACHOL) 20 MG tablet Take 20 mg by mouth daily.      . sitaGLIPtin (JANUVIA) 100 MG tablet Take 50 mg by mouth daily.         Allergies  Allergen Reactions  . Codeine     Rash     Past Medical History  Diagnosis Date  . Sliding hiatal hernia   . Erosive esophagitis   . Peptic stricture of esophagus   . Diverticulosis   . Arthritis   . Hypothyroidism   . Diabetes mellitus     Type I  . Coronary artery disease     s/p CABG 2001  . Sick sinus syndrome     s/p Medtronic DDD pacemaker implanted  . Hypertension   . Hyperlipidemia   . GERD (gastroesophageal  reflux disease)   . Adenomatous colon polyp 07/2002  . CVA (cerebral vascular accident)     Past Surgical History  Procedure Date  . Pacemaker placement 03/25/2000    by Dr Juanda Chance  . Tonsillectomy and adenoidectomy   . Appendectomy   . Cholecystectomy   . Total abdominal hysterectomy w/ bilateral salpingoophorectomy     Endometiosis  . Replacement total knee 2004    Right  . Colonoscopy w/ polypectomy 2003  . Cataract extraction 05/2005  . Coronary artery bypass graft 02/2000    5 vessel  . Rotator cuff repair     History  Smoking status  . Former Smoker  Smokeless tobacco  . Never Used  Comment: quit 28+ yrs ago (as of 2012)    History  Alcohol Use No    Family History  Problem Relation Age of Onset  . Colon cancer Mother 21  . Breast cancer Mother   . Colon cancer Brother 45  . Breast cancer Sister   . Prostate cancer Brother     Review of Systems: The review of systems is positive for some arthritis.  She says her blood pressure at home has been very good. She was anxious about being late for today's appointment which is probably why it is  elevated. All other systems were reviewed and are negative.  Physical Exam: BP 138/68  Pulse 82  Ht 5\' 6"  (1.676 m)  Wt 196 lb 6.4 oz (89.086 kg)  BMI 31.70 kg/m2 Patient is very pleasant and in no acute distress. She is obese. Skin is warm and dry. Color is normal.  HEENT is unremarkable. Normocephalic/atraumatic. PERRL. Sclera are nonicteric. Neck is supple. No masses. No JVD. Lungs are clear. Cardiac exam shows a regular rate and rhythm. Pacemaker is in the right upper chest. Little swelling but no signs of infection. Abdomen is obese but soft. Extremities are without edema. Gait and ROM are intact. No gross neurologic deficits noted.   LABORATORY DATA: Lab Results  Component Value Date   WBC 7.7 03/16/2012   HGB 11.2* 03/16/2012   HCT 33.2* 03/16/2012   PLT 177.0 03/16/2012   GLUCOSE 252* 03/16/2012   CHOL 226* 06/17/2011   TRIG 382.0* 06/17/2011   HDL 48.80 06/17/2011   LDLDIRECT 110.8 06/17/2011   ALT 23 03/11/2011   AST 23 03/11/2011   NA 134* 03/16/2012   K 4.0 03/16/2012   CL 101 03/16/2012   CREATININE 1.9* 03/16/2012   BUN 43* 03/16/2012   CO2 21 03/16/2012   TSH 3.61 10/22/2011   INR 1.0 07/02/2008   HGBA1C 8.0* 01/27/2012   MICROALBUR 1.1 01/07/2010    Assessment / Plan:

## 2012-03-25 NOTE — Assessment & Plan Note (Signed)
Her blood pressure is well controlled at home. No change in her current medicines.

## 2012-03-25 NOTE — Assessment & Plan Note (Signed)
She is now s/p generator replacement. She feels much better. Her device has already been checked today. Her wound is ok. She may resume driving. She will have her regular follow up with Dr. Everardo All next month. No change in her current medicines. We will see her back according in 3 months with the device clinic. Patient is agreeable to this plan and will call if any problems develop in the interim.

## 2012-03-25 NOTE — Patient Instructions (Signed)
I think you are doing well. You may resume driving  Check your blood pressure at home.   Call the [facility] office at [phone] if you have any questions, problems or concerns.

## 2012-03-25 NOTE — Assessment & Plan Note (Signed)
Her shortness of breath is now resolved with resumption of AV synchrony.

## 2012-03-25 NOTE — Progress Notes (Signed)
Wound check pacer in clinic/seeing lori

## 2012-04-04 ENCOUNTER — Encounter: Payer: Medicare Other | Admitting: Internal Medicine

## 2012-04-13 ENCOUNTER — Telehealth: Payer: Self-pay | Admitting: Internal Medicine

## 2012-04-13 NOTE — Telephone Encounter (Signed)
[facility] Triage Call Report Triage Record Num: [id number] Operator: Amy Head Patient Name: [name] Call Date & Time: 04/12/2012 5:15:54PM Patient Phone: [phone] PCP: Marga Melnick Patient Gender: Female PCP Fax : [phone] Patient DOB: 07/23/1925 Practice Name: [facility] Reason for Call: Caller: [name]/Other; PCP: Marga Melnick; CB#: [phone]; Call regarding Stroke sx; hx TIA approx 1 year ago. Pt went out today, driving and got lost. Could not make sense of where she was. Went by office recognized it and was able to get home. (This was Friday, 04/08/12) Has been alert with some trouble "retrieving words since." Today, 04/12/12 is very confused and is arguing that today is not "today" is confused on date and time. Since Friday, has not been "right." Is now C/O that she can not see. Is also having hearing problems that are increased. Advised 911 per disp. Protocol(s) Used: Neurological Deficits Recommended Outcome per Protocol: Activate EMS 911 Reason for Outcome: Sudden change in mental status Care Advice: ~ Protect the patient from falling or other harm. ~ Do not give the patient anything to eat or drink. ~ An adult should stay with the patient, preferably one trained in CPR. ~ IMMEDIATE ACTION Write down provider's name. List or place the following in a bag for transport with the patient: current prescription and/or nonprescription medications; alternative treatments, therapies and medications; and street drugs. ~

## 2012-04-13 NOTE — Telephone Encounter (Signed)
Noted, will forward to Dr.Hopper as a FYI  

## 2012-04-14 ENCOUNTER — Encounter: Payer: Self-pay | Admitting: Internal Medicine

## 2012-04-14 ENCOUNTER — Ambulatory Visit (INDEPENDENT_AMBULATORY_CARE_PROVIDER_SITE_OTHER): Payer: Medicare Other | Admitting: Internal Medicine

## 2012-04-14 ENCOUNTER — Ambulatory Visit: Payer: Medicare Other | Admitting: Internal Medicine

## 2012-04-14 VITALS — BP 148/90 | HR 83 | Temp 97.9°F | Wt 189.0 lb

## 2012-04-14 DIAGNOSIS — N289 Disorder of kidney and ureter, unspecified: Secondary | ICD-10-CM

## 2012-04-14 DIAGNOSIS — E785 Hyperlipidemia, unspecified: Secondary | ICD-10-CM

## 2012-04-14 DIAGNOSIS — E871 Hypo-osmolality and hyponatremia: Secondary | ICD-10-CM | POA: Diagnosis not present

## 2012-04-14 DIAGNOSIS — R4182 Altered mental status, unspecified: Secondary | ICD-10-CM

## 2012-04-14 DIAGNOSIS — H9190 Unspecified hearing loss, unspecified ear: Secondary | ICD-10-CM | POA: Diagnosis not present

## 2012-04-14 DIAGNOSIS — H539 Unspecified visual disturbance: Secondary | ICD-10-CM

## 2012-04-14 DIAGNOSIS — R0989 Other specified symptoms and signs involving the circulatory and respiratory systems: Secondary | ICD-10-CM

## 2012-04-14 NOTE — Patient Instructions (Addendum)
Please try to go on My Chart within the next 24 hours to allow me to release the results directly to you. .Share reports with  N[facility].  You can  NOT drive until the evaluation is complete and you're cleared by the specialist. Driving could jeopardize your health as well as the health of the general population.

## 2012-04-14 NOTE — Progress Notes (Signed)
Subjective:    Patient ID: [name], female    DOB: [birth year], 78 y.o.   MRN: [id number]  HPI She presents today with significant mental status changes over the last 2 weeks at least. On 04/08/12 she became lost temporarily while driving. She was able to find her way back home. Over the ensuing 4 days she had intermittent confusion. She also had some associated decreased ability to focus in reference to her vision. Her family believes that she's had progressive hearing loss over past year , worse in past month "dramatically".  She contacted the on-call nursing service  5/21 who recommended she be taken emergency room emergently because of her difficulty with mentation and talking. According to her daughter she has had difficulty with word retrieval. She was arguing with her son-in-law as to whether the date on the newspaper was correct. The ER visit was not completed, because she refused to go.  She denies visual or auditory hallucinations. She has had vivid dreams but not frank  nightmares.  She has not had head injury or loss of consciousness  Significantly she is status post pacemaker generator replacement for sinoatrial node dysfunction. That have been associated with dyspnea which is resolved after return of atrial ventricular synchrony.  The cardiology visits 03/25/12 were reviewed. Labs collected prior to that visit 03/16/12 revealed a hematocrit of 33.2; sodium 134; creatinine 1.9; and A1c of 8%.  She is on Plavix in addition to multiple other medications.  She did have a TIA in 2011 for which he was followed by neurology  She has not had carotid Doppler or echocardiogram recently according to her daughter    Review of Systems She was treated for blepharitis in January of this year with followup by Dr. Nile Riggs, her Ophthalmologist. In December she was treated by her otolaryngologist for recurrent sinus infections. Dr. Ezzard Standing has performed audiology evaluations within the past  year She denies fever, chills, significant weight change, fatigue, weakness or night sweats She has not had diplopia or difficulty swallowing She has had some right-sided frontal headaches according to her daughter. She actually describes discomfort in the orbital areas intermittently as "soreness". She has had some intermittent dizziness without frank vertigo Some  anxiety w/o depression Endocrine: She has had some cold intolerance for which she has worn a heavier house coat. Hematologic/lymphatic: No bruising or  abnormal clotting       Objective:   Physical Exam  Gen. appearance: Well-nourished but weight excess, in no distress Eyes: Extraocular motion intact, field of vision decreased OS laterally, vision grossly intact, no nystagmus ENT: Canals clear, tympanic membranes normal, tuning fork exam abnormal with BC > AC   & lateralization to L , hearing grossly decreased on L Neck: Normal range of motion, no masses, normal thyroid Cardiovascular: Rate and rhythm irregular;distant  faint systolic murmur; occasional extra heart sounds Vasc: bilateral carotid bruits; pulses intact  Muscle skeletal: minor OA finger changes, tone, &  strength normal Extremities: lipedema @ elbows & ankles Neuro:see cranial nerve deficit, deep tendon  reflexes normal,limping on L hip ; Romberg unsteady , finger to nose good Lymph: No cervical or axillary LA Skin: Warm and dry without suspicious lesions or rashes Psych: no anxiety or mood change. Normally interactive and cooperative.  Mini-Mental Status exam was completed . Results revealed:    25 out of 30        Assessment & Plan:  #1 mental status changes, fluctuating.  #2 abnormal tuning fork exam  #3  questionable visual field cut  #4 past medical history TIAs  #5 hyponatremia  #6 renal insufficiency  Plan: See orders and recommendations. [facility] should be involved in her care. The clinical factors to address the polypharmacy and  limiting any nonessential medications.

## 2012-04-15 LAB — BASIC METABOLIC PANEL
BUN: 21 mg/dL (ref 6–23)
CO2: 25 mEq/L (ref 19–32)
GFR: 39.33 mL/min — ABNORMAL LOW (ref >60.00)
Glucose, Bld: 92 mg/dL (ref 70–99)
Potassium: 4.4 mEq/L (ref 3.5–5.1)
Sodium: 141 mEq/L (ref 135–145)

## 2012-04-20 ENCOUNTER — Ambulatory Visit (INDEPENDENT_AMBULATORY_CARE_PROVIDER_SITE_OTHER)
Admission: RE | Admit: 2012-04-20 | Discharge: 2012-04-20 | Disposition: A | Discharge status: Home / Self Care | Payer: Medicare Other | Source: Ambulatory Visit | Attending: Internal Medicine | Admitting: Internal Medicine

## 2012-04-20 DIAGNOSIS — H538 Other visual disturbances: Secondary | ICD-10-CM | POA: Diagnosis not present

## 2012-04-20 DIAGNOSIS — H539 Unspecified visual disturbance: Secondary | ICD-10-CM

## 2012-04-20 DIAGNOSIS — H9190 Unspecified hearing loss, unspecified ear: Secondary | ICD-10-CM | POA: Diagnosis not present

## 2012-04-20 DIAGNOSIS — R4182 Altered mental status, unspecified: Secondary | ICD-10-CM

## 2012-04-20 DIAGNOSIS — G459 Transient cerebral ischemic attack, unspecified: Secondary | ICD-10-CM | POA: Diagnosis not present

## 2012-04-22 ENCOUNTER — Other Ambulatory Visit: Payer: Self-pay | Admitting: *Deleted

## 2012-04-22 DIAGNOSIS — R0989 Other specified symptoms and signs involving the circulatory and respiratory systems: Secondary | ICD-10-CM

## 2012-04-26 ENCOUNTER — Other Ambulatory Visit: Payer: Self-pay | Admitting: Internal Medicine

## 2012-04-26 ENCOUNTER — Encounter (INDEPENDENT_AMBULATORY_CARE_PROVIDER_SITE_OTHER): Payer: Medicare Other

## 2012-04-26 DIAGNOSIS — R0989 Other specified symptoms and signs involving the circulatory and respiratory systems: Secondary | ICD-10-CM | POA: Diagnosis not present

## 2012-04-26 DIAGNOSIS — I6529 Occlusion and stenosis of unspecified carotid artery: Secondary | ICD-10-CM | POA: Diagnosis not present

## 2012-04-26 MED ORDER — CHOLINE FENOFIBRATE 135 MG PO CPDR: 135.0000 mg | DELAYED_RELEASE_CAPSULE | Freq: Every day | ORAL | Status: DC

## 2012-04-26 NOTE — Telephone Encounter (Signed)
refill trilpix cap 135mg   Take one capsule daily Requesting 90-day supply I do not see on meds list Last ov 5.23.13

## 2012-04-26 NOTE — Telephone Encounter (Signed)
Lipid 272.4/995.20 due 05/2012, medication was on list under generic name

## 2012-04-28 ENCOUNTER — Ambulatory Visit (INDEPENDENT_AMBULATORY_CARE_PROVIDER_SITE_OTHER): Payer: Medicare Other | Admitting: Endocrinology

## 2012-04-28 ENCOUNTER — Other Ambulatory Visit (INDEPENDENT_AMBULATORY_CARE_PROVIDER_SITE_OTHER): Payer: Medicare Other

## 2012-04-28 ENCOUNTER — Encounter: Payer: Self-pay | Admitting: Endocrinology

## 2012-04-28 VITALS — BP 134/72 | HR 78 | Temp 97.1°F | Wt 191.0 lb

## 2012-04-28 DIAGNOSIS — E119 Type 2 diabetes mellitus without complications: Secondary | ICD-10-CM | POA: Insufficient documentation

## 2012-04-28 DIAGNOSIS — N058 Unspecified nephritic syndrome with other morphologic changes: Secondary | ICD-10-CM

## 2012-04-28 DIAGNOSIS — E1129 Type 2 diabetes mellitus with other diabetic kidney complication: Secondary | ICD-10-CM | POA: Diagnosis not present

## 2012-04-28 DIAGNOSIS — E1165 Type 2 diabetes mellitus with hyperglycemia: Secondary | ICD-10-CM

## 2012-04-28 NOTE — Patient Instructions (Addendum)
check your blood sugar 1 time a day.  vary the time of day when you check, between before the 3 meals, and at bedtime.  also check if you have symptoms of your blood sugar being too high or too low.  please keep a record of the readings and bring it to your next appointment here.  please call us sooner if you are having low blood sugar episodes, or if it stays over 200. Please come back for a follow-up appointment for 3 months.  blood tests are being requested for you today.  You will receive a letter with results.  If the blood test is high, i may advise you to take an additional medication.

## 2012-04-28 NOTE — Progress Notes (Signed)
Subjective:    Patient ID: [name], female    DOB: 1925/09/14, 78 y.o.   MRN: [id number]  HPI Pt returns for f/u of type 2 DM (dx'ed 2001; complicated by CVA, renal insuff, and CAD).  She is not on metformin, due to chf.  She is not on actos, due to edema.  She is on minimal amaryl, due to renal insuff.  she brings a record of her cbg's which i have reviewed today.  It varies from 93-300, but most are in the 100's.  There is no trend throughout the day.   Past Medical History  Diagnosis Date  . Sliding hiatal hernia   . Erosive esophagitis   . Peptic stricture of esophagus   . Diverticulosis   . Arthritis   . Hypothyroidism   . Diabetes mellitus     Type I  . Coronary artery disease     s/p CABG 2001  . Sick sinus syndrome     s/p Medtronic DDD pacemaker implanted; generator change 02/2012  . Hypertension   . Hyperlipidemia   . GERD (gastroesophageal reflux disease)   . Adenomatous colon polyp 07/2002  . CVA (cerebral vascular accident)     Dr Debarah Crape, Neurology    Past Surgical History  Procedure Date  . Pacemaker placement 03/25/2000    by Dr Juanda Chance  . Tonsillectomy and adenoidectomy   . Appendectomy   . Cholecystectomy   . Total abdominal hysterectomy w/ bilateral salpingoophorectomy     Endometiosis  . Replacement total knee 2004    Right  . Colonoscopy w/ polypectomy 2003  . Cataract extraction 05/2005  . Coronary artery bypass graft 02/2000    5 vessel  . Rotator cuff repair   . Pacemaker generator replacement 03/2012    Dr. Johney Frame    History   Social History  . Marital Status: Widowed    Spouse Name: N/A    Number of Children: 1  . Years of Education: N/A   Occupational History  . Retired    Social History Main Topics  . Smoking status: Former Games developer  . Smokeless tobacco: Former Neurosurgeon    Quit date: 11/23/1981   Comment: quit 28+ yrs ago (as of 2012)  . Alcohol Use: No  . Drug Use: No  . Sexually Active: No   Other Topics Concern  . Not on file     Social History Narrative   Lives with her grandson and his wife.Daily caffeine use.    Current Outpatient Prescriptions on File Prior to Visit  Medication Sig Dispense Refill  . bromocriptine (PARLODEL) 2.5 MG tablet Take 2.5 mg by mouth 2 (two) times daily.      . Calcium Carbonate (CALCIUM 500 PO) Take 500 mg by mouth once.       . clopidogrel (PLAVIX) 75 MG tablet Take 75 mg by mouth daily.      . colesevelam (WELCHOL) 625 MG tablet Take 1,875 mg by mouth 2 (two) times daily with a meal.      . digoxin (LANOXIN) 0.125 MG tablet Take 125 mcg by mouth See admin instructions. One daily except 1 1/2 tab every Wed      . enalapril (VASOTEC) 20 MG tablet Take 1 tablet (20 mg total) by mouth daily.  90 tablet  1  . fish oil-omega-3 fatty acids 1000 MG capsule Take 1 g by mouth daily.      Marland Kitchen levothyroxine (SYNTHROID, LEVOTHROID) 25 MCG tablet Take 25 mcg by mouth daily.      Marland Kitchen  meclizine (ANTIVERT) 25 MG tablet Take 25 mg by mouth 3 (three) times daily as needed. For dizziness      . metoprolol (TOPROL-XL) 200 MG 24 hr tablet Take 1 tablet (200 mg total) by mouth daily.  90 tablet  3  . niacin 500 MG tablet Take 500 mg by mouth daily with breakfast.       . nitroGLYCERIN (NITROSTAT) 0.3 MG SL tablet Place 0.3 mg under the tongue every 5 (five) minutes as needed. For chest pain      . pantoprazole (PROTONIX) 40 MG tablet Take 40 mg by mouth daily.      . pravastatin (PRAVACHOL) 20 MG tablet Take 20 mg by mouth daily.      . sitaGLIPtin (JANUVIA) 100 MG tablet Take 50 mg by mouth daily.       . fenofibrate micronized (LOFIBRA) 134 MG capsule Take 1 capsule (134 mg total) by mouth daily before breakfast.  90 capsule  0    Allergies  Allergen Reactions  . Codeine     Rash     Family History  Problem Relation Age of Onset  . Colon cancer Mother 41  . Breast cancer Mother   . Colon cancer Brother 43  . Breast cancer Sister   . Prostate cancer Brother   . Transient ischemic attack Mother    . Heart disease      3 brothers had MI; 1 pre 11  . Hypertension Sister     BP 134/72  Pulse 78  Temp 97.1 F (36.2 C) (Oral)  Wt 191 lb (86.637 kg)  SpO2 94%    Review of Systems denies hypoglycemia    Objective:   Physical Exam EXTEMITIES: no deformity. no ulcer on the feet. feet are of normal color and temp. Trace blat leg edema. There are bilat varicosities. there is a vein harvest scar on the right leg.  PULSES: dorsalis pedis intact bilat.  NEURO: sensation is intact to touch on the feet.   Lab Results  Component Value Date   HGBA1C 7.8* 04/28/2012      Assessment & Plan:  DM.  Needs increased rx, if it can be done with a regimen that avoids or minimizes hypoglycemia.

## 2012-04-29 ENCOUNTER — Encounter: Payer: Self-pay | Admitting: Endocrinology

## 2012-04-29 MED ORDER — CANAGLIFLOZIN 100 MG PO TABS: 1.0000 | ORAL_TABLET | Freq: Every day | ORAL | Status: DC

## 2012-05-02 MED ORDER — FENOFIBRATE MICRONIZED 134 MG PO CAPS: 134.0000 mg | ORAL_CAPSULE | Freq: Every day | ORAL | Status: DC

## 2012-05-02 NOTE — Telephone Encounter (Signed)
Addended by: Verdene Rio on: 05/02/2012 06:13 PM   Modules accepted: Orders

## 2012-05-03 ENCOUNTER — Telehealth: Payer: Self-pay | Admitting: *Deleted

## 2012-05-03 ENCOUNTER — Encounter: Payer: Self-pay | Admitting: *Deleted

## 2012-05-03 DIAGNOSIS — I6350 Cerebral infarction due to unspecified occlusion or stenosis of unspecified cerebral artery: Secondary | ICD-10-CM | POA: Diagnosis not present

## 2012-05-03 DIAGNOSIS — E119 Type 2 diabetes mellitus without complications: Secondary | ICD-10-CM | POA: Diagnosis not present

## 2012-05-03 DIAGNOSIS — Z950 Presence of cardiac pacemaker: Secondary | ICD-10-CM | POA: Diagnosis not present

## 2012-05-03 DIAGNOSIS — I2510 Atherosclerotic heart disease of native coronary artery without angina pectoris: Secondary | ICD-10-CM | POA: Diagnosis not present

## 2012-05-03 DIAGNOSIS — I10 Essential (primary) hypertension: Secondary | ICD-10-CM | POA: Diagnosis not present

## 2012-05-03 DIAGNOSIS — R413 Other amnesia: Secondary | ICD-10-CM | POA: Diagnosis not present

## 2012-05-03 DIAGNOSIS — E785 Hyperlipidemia, unspecified: Secondary | ICD-10-CM | POA: Diagnosis not present

## 2012-05-03 NOTE — Telephone Encounter (Signed)
Pt return call back gave results concerning A1C... 05/03/12@3 :22pm/LMB

## 2012-05-03 NOTE — Telephone Encounter (Signed)
Called pt to inform of lab results, left message for pt to callback office (letter also mailed to pt). 

## 2012-05-09 ENCOUNTER — Encounter: Payer: Self-pay | Admitting: *Deleted

## 2012-05-11 ENCOUNTER — Telehealth: Payer: Self-pay

## 2012-05-11 NOTE — Telephone Encounter (Signed)
[name] called stating that Rx for [name] is too expensive - $200+ for a 90 day supply. Pt has received #30 but will not refill. Please advise on a cheaper alternative.

## 2012-05-16 ENCOUNTER — Encounter: Payer: Self-pay | Admitting: *Deleted

## 2012-05-16 ENCOUNTER — Telehealth: Payer: Self-pay | Admitting: Internal Medicine

## 2012-05-16 MED ORDER — DIGOXIN 125 MCG PO TABS: 125.0000 ug | ORAL_TABLET | ORAL | Status: DC

## 2012-05-16 NOTE — Telephone Encounter (Signed)
[name] informed of MD's advisement. [name] states that they have filed a Theatre manager with insurance company to see if they will pay for 90 day supply of rx and if so they will callback for refill of 90 day rx for Invokana.

## 2012-05-16 NOTE — Telephone Encounter (Signed)
This encounter was created in error - please disregard.

## 2012-05-16 NOTE — Telephone Encounter (Signed)
New Problem:    Patient called in needing a refill of her digoxin (LANOXIN) 0.125 MG tablet [facility] [address] Patient did not know the phone number to the pharmacy.  Please call once the order has been placed.

## 2012-05-16 NOTE — Telephone Encounter (Signed)
Please stop invokana back to glimepiride.  i'll see you next time.

## 2012-05-17 ENCOUNTER — Other Ambulatory Visit: Payer: Self-pay

## 2012-05-17 MED ORDER — DIGOXIN 125 MCG PO TABS: 125.0000 ug | ORAL_TABLET | ORAL | Status: DC

## 2012-05-24 ENCOUNTER — Other Ambulatory Visit: Payer: Self-pay | Admitting: Internal Medicine

## 2012-05-24 MED ORDER — DIGOXIN 125 MCG PO TABS: 125.0000 ug | ORAL_TABLET | ORAL | Status: DC

## 2012-05-24 NOTE — Telephone Encounter (Signed)
Reill-digoxin (LANOXIN) 0.125 MG tablet (90 Day supply )   [facility]

## 2012-05-30 ENCOUNTER — Other Ambulatory Visit: Payer: Self-pay | Admitting: Internal Medicine

## 2012-05-30 MED ORDER — NITROGLYCERIN 0.3 MG SL SUBL: 0.3000 mg | SUBLINGUAL_TABLET | SUBLINGUAL | Status: DC | PRN

## 2012-05-30 NOTE — Telephone Encounter (Signed)
[facility] [address] Dispense only 30  Pill.

## 2012-06-06 DIAGNOSIS — M1710 Unilateral primary osteoarthritis, unspecified knee: Secondary | ICD-10-CM | POA: Diagnosis not present

## 2012-06-06 DIAGNOSIS — H26499 Other secondary cataract, unspecified eye: Secondary | ICD-10-CM | POA: Diagnosis not present

## 2012-06-06 DIAGNOSIS — Z961 Presence of intraocular lens: Secondary | ICD-10-CM | POA: Diagnosis not present

## 2012-06-06 DIAGNOSIS — E119 Type 2 diabetes mellitus without complications: Secondary | ICD-10-CM | POA: Diagnosis not present

## 2012-06-08 DIAGNOSIS — R42 Dizziness and giddiness: Secondary | ICD-10-CM | POA: Diagnosis not present

## 2012-06-08 DIAGNOSIS — H905 Unspecified sensorineural hearing loss: Secondary | ICD-10-CM | POA: Diagnosis not present

## 2012-06-08 DIAGNOSIS — H9319 Tinnitus, unspecified ear: Secondary | ICD-10-CM | POA: Diagnosis not present

## 2012-06-10 ENCOUNTER — Telehealth: Payer: Self-pay | Admitting: Internal Medicine

## 2012-06-10 NOTE — Telephone Encounter (Signed)
Discuss with patient daughter who indicated that Pt has already seen Dr Vivia Ewing in the past when we referred her back in May. Pt daughter indicated that Dr Vivia Ewing did nothing for the Pt and notes that he was even willing to give Pt her license back. Offer to refer Pt to another neuro Pt daughter decline. Advise Pt daughter if she did not want to be referred to another neuro that she need to contact current neuro and advise them of the new changes to Pt condition. Pt daughter then states that she hopes that all this info is document because if anything happen she is going to contact her attorney because none of her doctor are willing to help her with her mom and she is all alone. Pt daughter then hung up phone. Unable to call her back due to phones being down.

## 2012-06-10 NOTE — Telephone Encounter (Signed)
Pt. Daughter Dondra Spry (ON DPR) states patient has had a sudden onset of confusion, lack of focus, aggression, which is not normal. Daughter stated Pt had a cognative test done on 5.23.13 and seem to do ok but daughter feels problem has progressed and is not sure what to to. Can call daughter back at the following #'s Home [phone] Cell [phone]

## 2012-06-10 NOTE — Telephone Encounter (Signed)
Neurology assessment indicated; diagnosis mental status changes

## 2012-06-10 NOTE — Telephone Encounter (Signed)
Left message to call office

## 2012-06-11 ENCOUNTER — Other Ambulatory Visit: Payer: Self-pay | Admitting: Internal Medicine

## 2012-06-11 DIAGNOSIS — R4182 Altered mental status, unspecified: Secondary | ICD-10-CM

## 2012-06-11 NOTE — Telephone Encounter (Signed)
It is understandable that the daughter is upset by her mother's mental status changes. It is imperative that she be reevaluated by a neurologist in view of these recurrent or new symptoms.  I can ask the local neurologist to reevaluate [name] or I can refer to a medical center;whichever would be her preference. Her veiled threat of possible legal action is inappropriate and does not benefit her mother's care.Whether this was meant as a threat or not; it certainly was perceived as such. A healthy and productive patient: caregiver relationship is not possible if this is to be the tone of future communications.

## 2012-06-11 NOTE — Telephone Encounter (Signed)
I reviewed Dr. Zannie Cove neurology evaluation. She had recommended that Mrs. Trauger not Drive until reevaluated

## 2012-06-13 NOTE — Telephone Encounter (Signed)
I spoke with patient's daughter. Dondra Spry states she spoke directly with Dr.Hopper and is awaiting appointment with Dr. Vivia Ewing. Patient's daughter expressed her apology for getting upset about this situation for it makes her emotional to see her mom with these mental health changes.

## 2012-06-15 ENCOUNTER — Other Ambulatory Visit: Payer: Self-pay

## 2012-06-15 DIAGNOSIS — H26499 Other secondary cataract, unspecified eye: Secondary | ICD-10-CM | POA: Diagnosis not present

## 2012-06-15 MED ORDER — CANAGLIFLOZIN 100 MG PO TABS: 1.0000 | ORAL_TABLET | Freq: Every day | ORAL | Status: DC

## 2012-06-16 DIAGNOSIS — Z950 Presence of cardiac pacemaker: Secondary | ICD-10-CM | POA: Diagnosis not present

## 2012-06-16 DIAGNOSIS — I2510 Atherosclerotic heart disease of native coronary artery without angina pectoris: Secondary | ICD-10-CM | POA: Diagnosis not present

## 2012-06-16 DIAGNOSIS — R413 Other amnesia: Secondary | ICD-10-CM | POA: Diagnosis not present

## 2012-06-16 DIAGNOSIS — E785 Hyperlipidemia, unspecified: Secondary | ICD-10-CM | POA: Diagnosis not present

## 2012-06-20 ENCOUNTER — Encounter: Payer: Self-pay | Admitting: Internal Medicine

## 2012-06-20 ENCOUNTER — Ambulatory Visit (INDEPENDENT_AMBULATORY_CARE_PROVIDER_SITE_OTHER): Payer: Medicare Other | Admitting: Cardiology

## 2012-06-20 ENCOUNTER — Encounter: Payer: Self-pay | Admitting: Cardiology

## 2012-06-20 VITALS — BP 139/71 | HR 81 | Ht 66.0 in | Wt 179.0 lb

## 2012-06-20 DIAGNOSIS — Z950 Presence of cardiac pacemaker: Secondary | ICD-10-CM | POA: Diagnosis not present

## 2012-06-20 DIAGNOSIS — I495 Sick sinus syndrome: Secondary | ICD-10-CM

## 2012-06-20 DIAGNOSIS — N390 Urinary tract infection, site not specified: Secondary | ICD-10-CM | POA: Diagnosis not present

## 2012-06-20 NOTE — Progress Notes (Signed)
ELECTROPHYSIOLOGY OFFICE NOTE  Patient ID: [name] MRN: [id number], DOB/AGE: [birth year]   Date of Visit: 06/20/2012  Primary Physician: Marga Melnick, MD Primary Cardiologist: previously Charlies Constable, MD Primary EP: Hillis Range, MD Reason for Visit: Device follow-up  History of Present Illness [name] is an 78 year old woman with CAD s/p CABG, HTN, dyslipidemia, DM, CVA and SSS s/p dual chamber PPM implantation who presents today for device follow-up after pacemaker generator change in April with Dr. Johney Frame. She is accompanied by her daughter. She reports recent TIAs and she is now followed regularly by neurology. She denies any other changes recently to her health or medical history. She denies any cardiac complaints. She denies CP, SOB, palpitations or syncope.   Past Medical History  Diagnosis Date  . Sliding hiatal hernia   . Erosive esophagitis   . Peptic stricture of esophagus   . Diverticulosis   . Arthritis   . Hypothyroidism   . Diabetes mellitus     Type I  . Coronary artery disease     s/p CABG 2001  . Sick sinus syndrome     s/p Medtronic DDD pacemaker implanted; generator change 02/2012  . Hypertension   . Hyperlipidemia   . GERD (gastroesophageal reflux disease)   . Adenomatous colon polyp 07/2002  . CVA (cerebral vascular accident)     Dr Debarah Crape, Neurology     Past Surgical History  Procedure Date  . Pacemaker placement 03/25/2000    by Dr Juanda Chance  . Tonsillectomy and adenoidectomy   . Appendectomy   . Cholecystectomy   . Total abdominal hysterectomy w/ bilateral salpingoophorectomy     Endometiosis  . Replacement total knee 2004    Right  . Colonoscopy w/ polypectomy 2003  . Cataract extraction 05/2005  . Coronary artery bypass graft 02/2000    5 vessel  . Rotator cuff repair   . Pacemaker generator replacement 03/2012    Dr. Johney Frame     Allergies/Intolerances Allergies  Allergen Reactions  . Codeine     Rash     Current Home  Medications Current Outpatient Prescriptions  Medication Sig Dispense Refill  . Calcium Carbonate (CALCIUM 500 PO) Take 500 mg by mouth once.       . Canagliflozin (INVOKANA) 100 MG TABS Take 1 tablet by mouth daily.  90 tablet  3  . clopidogrel (PLAVIX) 75 MG tablet Take 75 mg by mouth daily.      . colesevelam (WELCHOL) 625 MG tablet Take 1,875 mg by mouth 2 (two) times daily with a meal.      . digoxin (LANOXIN) 0.125 MG tablet Take 1 tablet (125 mcg total) by mouth See admin instructions. One daily except 1 1/2 tab every Wed  34 tablet  4  . enalapril (VASOTEC) 20 MG tablet Take 1 tablet (20 mg total) by mouth daily.  90 tablet  1  . fenofibrate micronized (LOFIBRA) 134 MG capsule Take 1 capsule (134 mg total) by mouth daily before breakfast.  90 capsule  0  . fish oil-omega-3 fatty acids 1000 MG capsule Take 1 g by mouth daily.      Marland Kitchen levothyroxine (SYNTHROID, LEVOTHROID) 25 MCG tablet Take 25 mcg by mouth daily.      . memantine (NAMENDA) 5 MG tablet Take 5 mg by mouth daily.      . metoprolol (TOPROL-XL) 200 MG 24 hr tablet Take 1 tablet (200 mg total) by mouth daily.  90 tablet  3  .  niacin 500 MG tablet Take 500 mg by mouth daily with breakfast.       . nitroGLYCERIN (NITROSTAT) 0.3 MG SL tablet Place 1 tablet (0.3 mg total) under the tongue every 5 (five) minutes as needed. For chest pain  90 tablet  3  . pantoprazole (PROTONIX) 40 MG tablet Take 40 mg by mouth daily.      . pravastatin (PRAVACHOL) 20 MG tablet Take 20 mg by mouth daily.      . sitaGLIPtin (JANUVIA) 100 MG tablet Take 50 mg by mouth daily.         Social History Social History  . Marital Status: Widowed    Spouse Name: N/A    Number of Children: 1  . Years of Education: N/A   Occupational History  . Retired    Social History Main Topics  . Smoking status: Former Games developer  . Smokeless tobacco: Former Neurosurgeon    Quit date: 11/23/1981   Comment: quit 28+ yrs ago (as of 2012)  . Alcohol Use: No  . Drug Use: No    Social History Narrative   Lives with her grandson and his wife.Daily caffeine use.     Review of Systems General: No chills, fever, night sweats or weight changes Cardiovascular: No chest pain, dyspnea on exertion, edema, orthopnea, palpitations, paroxysmal nocturnal dyspnea Dermatological: No rash, lesions or masses Respiratory: No cough, dyspnea Urologic: No hematuria, dysuria Abdominal: No nausea, vomiting, diarrhea, bright red blood per rectum, melena, or hematemesis Neurologic: No visual changes, weakness, changes in mental status All other systems reviewed and are otherwise negative except as noted above.  Physical Exam Blood pressure 139/71, pulse 81, height 5\' 6"  (1.676 m), weight 179 lb (81.194 kg).  General: Well developed, elderly appearing 78 year old female in no acute distress. HEENT: Normocephalic, atraumatic. EOMs intact. Sclera nonicteric. Oropharynx clear.  Neck: Supple. No JVD. Lungs:  Respirations regular and unlabored, CTA bilaterally. No wheezes, rales or rhonchi. Heart: RRR. S1, S2 present. No murmur, rub, S3 or S4. Abdomen: Soft, non-distended.  Extremities: No clubbing, cyanosis or edema.  Psych: Normal affect. Neuro: Alert and oriented X 3. Moves all extremities spontaneously.   Diagnostics Device interrogation shows normal PPM function with good battery status and stable lead parameter/measurements; no episodes; no programming changes made; see PaceArt report  Assessment and Plan 1. Sinus node dysfunction s/p PPM - normal device function; no programming changes made; continue routine device clinic follow-up - scheduled to see Dr. Johney Frame in 9 months; Ms. Shoults and her daughter expressed verbal understanding and agree with this plan of care  Signed, Rick Duff, PA-C 06/20/2012, 3:44 PM

## 2012-06-20 NOTE — Patient Instructions (Signed)
Your physician wants you to follow-up in: 9 months with Dr Allred You will receive a reminder letter in the mail two months in advance. If you don't receive a letter, please call our office to schedule the follow-up appointment.  

## 2012-06-21 ENCOUNTER — Telehealth: Payer: Self-pay | Admitting: Internal Medicine

## 2012-06-21 LAB — PACEMAKER DEVICE OBSERVATION
ATRIAL PACING PM: 94.7
BAMS-0001: 150 {beats}/min
RV LEAD IMPEDENCE PM: 781 Ohm
RV LEAD THRESHOLD: 1.125 V
VENTRICULAR PACING PM: 0.4

## 2012-06-21 NOTE — Telephone Encounter (Signed)
Message copied by Marshell Garfinkel on Tue Jun 21, 2012  4:01 PM ------      Message from: Pecola Lawless      Created: Tue Jun 21, 2012  1:12 PM       Please  schedule fasting Labs : CK,Lipids, hepatic panel.Codes [phone]

## 2012-06-21 NOTE — Telephone Encounter (Signed)
Pt is scheduled for tomorrow, 06/22/12.

## 2012-06-22 ENCOUNTER — Other Ambulatory Visit (INDEPENDENT_AMBULATORY_CARE_PROVIDER_SITE_OTHER): Payer: Medicare Other

## 2012-06-22 DIAGNOSIS — J0100 Acute maxillary sinusitis, unspecified: Secondary | ICD-10-CM | POA: Diagnosis not present

## 2012-06-22 DIAGNOSIS — E785 Hyperlipidemia, unspecified: Secondary | ICD-10-CM

## 2012-06-22 DIAGNOSIS — T887XXA Unspecified adverse effect of drug or medicament, initial encounter: Secondary | ICD-10-CM | POA: Diagnosis not present

## 2012-06-22 LAB — LDL CHOLESTEROL, DIRECT: Direct LDL: 114.2 mg/dL

## 2012-06-22 LAB — LIPID PANEL
Cholesterol: 220 mg/dL — ABNORMAL HIGH (ref 0–200)
HDL: 56.7 mg/dL (ref >39.00)
Total CHOL/HDL Ratio: 4
Triglycerides: 252 mg/dL — ABNORMAL HIGH (ref 0.0–149.0)
VLDL: 50.4 mg/dL — ABNORMAL HIGH (ref 0.0–40.0)

## 2012-06-22 LAB — HEPATIC FUNCTION PANEL
ALT: 27 U/L (ref 0–35)
Bilirubin, Direct: 0 mg/dL (ref 0.0–0.3)
Total Bilirubin: 0.3 mg/dL (ref 0.3–1.2)

## 2012-06-22 LAB — CK: Total CK: 89 U/L (ref 7–177)

## 2012-06-24 ENCOUNTER — Telehealth: Payer: Self-pay | Admitting: *Deleted

## 2012-06-24 NOTE — Telephone Encounter (Signed)
Pt daughter would like to be called instead of Pt when result are back if they are abnormal but ok to mail otherwise. [name]: [phone]

## 2012-06-28 NOTE — Telephone Encounter (Signed)
I spoke with patient's daughter and informed her of lab results , copy to be mailed

## 2012-07-11 DIAGNOSIS — Z8673 Personal history of transient ischemic attack (TIA), and cerebral infarction without residual deficits: Secondary | ICD-10-CM | POA: Diagnosis not present

## 2012-07-11 DIAGNOSIS — I10 Essential (primary) hypertension: Secondary | ICD-10-CM | POA: Diagnosis not present

## 2012-07-11 DIAGNOSIS — R269 Unspecified abnormalities of gait and mobility: Secondary | ICD-10-CM | POA: Diagnosis not present

## 2012-07-11 DIAGNOSIS — I2510 Atherosclerotic heart disease of native coronary artery without angina pectoris: Secondary | ICD-10-CM | POA: Diagnosis not present

## 2012-07-11 DIAGNOSIS — E119 Type 2 diabetes mellitus without complications: Secondary | ICD-10-CM | POA: Diagnosis not present

## 2012-07-11 DIAGNOSIS — R413 Other amnesia: Secondary | ICD-10-CM | POA: Diagnosis not present

## 2012-07-15 DIAGNOSIS — I2510 Atherosclerotic heart disease of native coronary artery without angina pectoris: Secondary | ICD-10-CM | POA: Diagnosis not present

## 2012-07-15 DIAGNOSIS — R413 Other amnesia: Secondary | ICD-10-CM | POA: Diagnosis not present

## 2012-07-15 DIAGNOSIS — Z8673 Personal history of transient ischemic attack (TIA), and cerebral infarction without residual deficits: Secondary | ICD-10-CM | POA: Diagnosis not present

## 2012-07-15 DIAGNOSIS — R269 Unspecified abnormalities of gait and mobility: Secondary | ICD-10-CM | POA: Diagnosis not present

## 2012-07-15 DIAGNOSIS — I10 Essential (primary) hypertension: Secondary | ICD-10-CM | POA: Diagnosis not present

## 2012-07-15 DIAGNOSIS — E119 Type 2 diabetes mellitus without complications: Secondary | ICD-10-CM | POA: Diagnosis not present

## 2012-07-19 DIAGNOSIS — Z8673 Personal history of transient ischemic attack (TIA), and cerebral infarction without residual deficits: Secondary | ICD-10-CM | POA: Diagnosis not present

## 2012-07-19 DIAGNOSIS — R413 Other amnesia: Secondary | ICD-10-CM | POA: Diagnosis not present

## 2012-07-19 DIAGNOSIS — E119 Type 2 diabetes mellitus without complications: Secondary | ICD-10-CM | POA: Diagnosis not present

## 2012-07-19 DIAGNOSIS — I2510 Atherosclerotic heart disease of native coronary artery without angina pectoris: Secondary | ICD-10-CM | POA: Diagnosis not present

## 2012-07-19 DIAGNOSIS — I10 Essential (primary) hypertension: Secondary | ICD-10-CM | POA: Diagnosis not present

## 2012-07-19 DIAGNOSIS — R269 Unspecified abnormalities of gait and mobility: Secondary | ICD-10-CM | POA: Diagnosis not present

## 2012-07-20 DIAGNOSIS — I2510 Atherosclerotic heart disease of native coronary artery without angina pectoris: Secondary | ICD-10-CM | POA: Diagnosis not present

## 2012-07-20 DIAGNOSIS — R269 Unspecified abnormalities of gait and mobility: Secondary | ICD-10-CM | POA: Diagnosis not present

## 2012-07-20 DIAGNOSIS — E119 Type 2 diabetes mellitus without complications: Secondary | ICD-10-CM | POA: Diagnosis not present

## 2012-07-20 DIAGNOSIS — R413 Other amnesia: Secondary | ICD-10-CM | POA: Diagnosis not present

## 2012-07-20 DIAGNOSIS — I10 Essential (primary) hypertension: Secondary | ICD-10-CM | POA: Diagnosis not present

## 2012-07-20 DIAGNOSIS — Z8673 Personal history of transient ischemic attack (TIA), and cerebral infarction without residual deficits: Secondary | ICD-10-CM | POA: Diagnosis not present

## 2012-07-21 DIAGNOSIS — R269 Unspecified abnormalities of gait and mobility: Secondary | ICD-10-CM | POA: Diagnosis not present

## 2012-07-21 DIAGNOSIS — E119 Type 2 diabetes mellitus without complications: Secondary | ICD-10-CM | POA: Diagnosis not present

## 2012-07-21 DIAGNOSIS — I10 Essential (primary) hypertension: Secondary | ICD-10-CM | POA: Diagnosis not present

## 2012-07-21 DIAGNOSIS — R413 Other amnesia: Secondary | ICD-10-CM | POA: Diagnosis not present

## 2012-07-21 DIAGNOSIS — Z8673 Personal history of transient ischemic attack (TIA), and cerebral infarction without residual deficits: Secondary | ICD-10-CM | POA: Diagnosis not present

## 2012-07-21 DIAGNOSIS — I2510 Atherosclerotic heart disease of native coronary artery without angina pectoris: Secondary | ICD-10-CM | POA: Diagnosis not present

## 2012-07-25 DIAGNOSIS — E119 Type 2 diabetes mellitus without complications: Secondary | ICD-10-CM | POA: Diagnosis not present

## 2012-07-25 DIAGNOSIS — R269 Unspecified abnormalities of gait and mobility: Secondary | ICD-10-CM | POA: Diagnosis not present

## 2012-07-25 DIAGNOSIS — I2510 Atherosclerotic heart disease of native coronary artery without angina pectoris: Secondary | ICD-10-CM | POA: Diagnosis not present

## 2012-07-25 DIAGNOSIS — I10 Essential (primary) hypertension: Secondary | ICD-10-CM | POA: Diagnosis not present

## 2012-07-25 DIAGNOSIS — R413 Other amnesia: Secondary | ICD-10-CM | POA: Diagnosis not present

## 2012-07-25 DIAGNOSIS — Z8673 Personal history of transient ischemic attack (TIA), and cerebral infarction without residual deficits: Secondary | ICD-10-CM | POA: Diagnosis not present

## 2012-07-26 DIAGNOSIS — E119 Type 2 diabetes mellitus without complications: Secondary | ICD-10-CM | POA: Diagnosis not present

## 2012-07-26 DIAGNOSIS — R269 Unspecified abnormalities of gait and mobility: Secondary | ICD-10-CM | POA: Diagnosis not present

## 2012-07-26 DIAGNOSIS — I10 Essential (primary) hypertension: Secondary | ICD-10-CM | POA: Diagnosis not present

## 2012-07-26 DIAGNOSIS — R413 Other amnesia: Secondary | ICD-10-CM | POA: Diagnosis not present

## 2012-07-26 DIAGNOSIS — Z8673 Personal history of transient ischemic attack (TIA), and cerebral infarction without residual deficits: Secondary | ICD-10-CM | POA: Diagnosis not present

## 2012-07-26 DIAGNOSIS — I2510 Atherosclerotic heart disease of native coronary artery without angina pectoris: Secondary | ICD-10-CM | POA: Diagnosis not present

## 2012-07-27 DIAGNOSIS — I10 Essential (primary) hypertension: Secondary | ICD-10-CM | POA: Diagnosis not present

## 2012-07-27 DIAGNOSIS — I2510 Atherosclerotic heart disease of native coronary artery without angina pectoris: Secondary | ICD-10-CM | POA: Diagnosis not present

## 2012-07-27 DIAGNOSIS — E119 Type 2 diabetes mellitus without complications: Secondary | ICD-10-CM | POA: Diagnosis not present

## 2012-07-27 DIAGNOSIS — R413 Other amnesia: Secondary | ICD-10-CM | POA: Diagnosis not present

## 2012-07-27 DIAGNOSIS — Z8673 Personal history of transient ischemic attack (TIA), and cerebral infarction without residual deficits: Secondary | ICD-10-CM | POA: Diagnosis not present

## 2012-07-27 DIAGNOSIS — R269 Unspecified abnormalities of gait and mobility: Secondary | ICD-10-CM | POA: Diagnosis not present

## 2012-07-28 DIAGNOSIS — Z8673 Personal history of transient ischemic attack (TIA), and cerebral infarction without residual deficits: Secondary | ICD-10-CM | POA: Diagnosis not present

## 2012-07-28 DIAGNOSIS — R269 Unspecified abnormalities of gait and mobility: Secondary | ICD-10-CM | POA: Diagnosis not present

## 2012-07-28 DIAGNOSIS — E119 Type 2 diabetes mellitus without complications: Secondary | ICD-10-CM | POA: Diagnosis not present

## 2012-07-28 DIAGNOSIS — I10 Essential (primary) hypertension: Secondary | ICD-10-CM | POA: Diagnosis not present

## 2012-07-28 DIAGNOSIS — I2510 Atherosclerotic heart disease of native coronary artery without angina pectoris: Secondary | ICD-10-CM | POA: Diagnosis not present

## 2012-07-28 DIAGNOSIS — R413 Other amnesia: Secondary | ICD-10-CM | POA: Diagnosis not present

## 2012-07-29 ENCOUNTER — Other Ambulatory Visit (INDEPENDENT_AMBULATORY_CARE_PROVIDER_SITE_OTHER): Payer: Medicare Other

## 2012-07-29 ENCOUNTER — Ambulatory Visit (INDEPENDENT_AMBULATORY_CARE_PROVIDER_SITE_OTHER): Payer: Medicare Other | Admitting: Endocrinology

## 2012-07-29 ENCOUNTER — Encounter: Payer: Self-pay | Admitting: Endocrinology

## 2012-07-29 VITALS — BP 128/72 | HR 74 | Temp 96.6°F | Ht 66.0 in | Wt 182.0 lb

## 2012-07-29 DIAGNOSIS — E1129 Type 2 diabetes mellitus with other diabetic kidney complication: Secondary | ICD-10-CM

## 2012-07-29 DIAGNOSIS — E1165 Type 2 diabetes mellitus with hyperglycemia: Secondary | ICD-10-CM | POA: Diagnosis not present

## 2012-07-29 DIAGNOSIS — N058 Unspecified nephritic syndrome with other morphologic changes: Secondary | ICD-10-CM

## 2012-07-29 DIAGNOSIS — Z79899 Other long term (current) drug therapy: Secondary | ICD-10-CM

## 2012-07-29 DIAGNOSIS — Z7189 Other specified counseling: Secondary | ICD-10-CM | POA: Insufficient documentation

## 2012-07-29 LAB — BASIC METABOLIC PANEL
BUN: 26 mg/dL — ABNORMAL HIGH (ref 6–23)
Chloride: 104 mEq/L (ref 96–112)
Glucose, Bld: 175 mg/dL — ABNORMAL HIGH (ref 70–99)
Potassium: 4.5 mEq/L (ref 3.5–5.1)

## 2012-07-29 MED ORDER — SITAGLIPTIN PHOSPHATE 100 MG PO TABS: 50.0000 mg | ORAL_TABLET | Freq: Every day | ORAL | Status: DC

## 2012-07-29 MED ORDER — CANAGLIFLOZIN 100 MG PO TABS
1.0000 | ORAL_TABLET | Freq: Every day | ORAL | Status: DC
Start: 2012-07-29 — End: 2013-09-04

## 2012-07-29 MED ORDER — COLESEVELAM HCL 625 MG PO TABS: 1875.0000 mg | ORAL_TABLET | Freq: Two times a day (BID) | ORAL | Status: DC

## 2012-07-29 NOTE — Patient Instructions (Addendum)
check your blood sugar 1 time a day.  vary the time of day when you check, between before the 3 meals, and at bedtime.  also check if you have symptoms of your blood sugar being too high or too low.  please keep a record of the readings and bring it to your next appointment here.  please call us sooner if you are having low blood sugar episodes, or if it stays over 200. Please come back for a follow-up appointment for 3 months.  blood tests are being requested for you today.  You will receive a letter with results.  If the blood test is high, i may advise you to take an additional medication called "nateglinide."   Please come back for a follow-up appointment in 6 weeks.

## 2012-07-29 NOTE — Progress Notes (Signed)
Subjective:    Patient ID: [name], female    DOB: 11/01/1925, 78 y.o.   MRN: [id number]  HPI Pt returns for f/u of type 2 DM (dx'ed 2001; complicated by CVA, renal insuff, and CAD).  She is not on metformin, due to chf.  She is not on actos, due to edema.  Parlodel was sopped by her neurologist, after her cva.  She is on minimal amaryl, due to renal insuff.  She is taking invokana despite the cost.  she brings a record of her cbg's which i have reviewed today.  It varies from the 100's to the low-200's. There is no trend throughout the day.  dtr and pt do not want for her to take insulin. Past Medical History  Diagnosis Date  . Sliding hiatal hernia   . Erosive esophagitis   . Peptic stricture of esophagus   . Diverticulosis   . Arthritis   . Hypothyroidism   . Diabetes mellitus     Type I  . Coronary artery disease     s/p CABG 2001  . Sick sinus syndrome     s/p Medtronic DDD pacemaker implanted; generator change 02/2012  . Hypertension   . Hyperlipidemia   . GERD (gastroesophageal reflux disease)   . Adenomatous colon polyp 07/2002  . CVA (cerebral vascular accident)     Dr Debarah Crape, Neurology    Past Surgical History  Procedure Date  . Pacemaker placement 03/25/2000    by Dr Juanda Chance  . Tonsillectomy and adenoidectomy   . Appendectomy   . Cholecystectomy   . Total abdominal hysterectomy w/ bilateral salpingoophorectomy     Endometiosis  . Replacement total knee 2004    Right  . Colonoscopy w/ polypectomy 2003  . Cataract extraction 05/2005  . Coronary artery bypass graft 02/2000    5 vessel  . Rotator cuff repair   . Pacemaker generator replacement 03/2012    Dr. Johney Frame    History   Social History  . Marital Status: Widowed    Spouse Name: N/A    Number of Children: 1  . Years of Education: N/A   Occupational History  . Retired    Social History Main Topics  . Smoking status: Former Games developer  . Smokeless tobacco: Former Neurosurgeon    Quit date: 11/23/1981   Comment: quit 28+ yrs ago (as of 2012)  . Alcohol Use: No  . Drug Use: No  . Sexually Active: No   Other Topics Concern  . Not on file   Social History Narrative   Lives with her grandson and his wife.Daily caffeine use.    Current Outpatient Prescriptions on File Prior to Visit  Medication Sig Dispense Refill  . Calcium Carbonate (CALCIUM 500 PO) Take 500 mg by mouth once.       . clopidogrel (PLAVIX) 75 MG tablet Take 75 mg by mouth daily.      . colesevelam (WELCHOL) 625 MG tablet Take 3 tablets (1,875 mg total) by mouth 2 (two) times daily with a meal.  540 tablet  3  . digoxin (LANOXIN) 0.125 MG tablet Take 1 tablet (125 mcg total) by mouth See admin instructions. One daily except 1 1/2 tab every Wed  34 tablet  4  . enalapril (VASOTEC) 20 MG tablet Take 1 tablet (20 mg total) by mouth daily.  90 tablet  1  . fenofibrate micronized (LOFIBRA) 134 MG capsule Take 1 capsule (134 mg total) by mouth daily before breakfast.  90 capsule  0  . fish oil-omega-3 fatty acids 1000 MG capsule Take 1 g by mouth daily.      Marland Kitchen levothyroxine (SYNTHROID, LEVOTHROID) 25 MCG tablet Take 25 mcg by mouth daily.      . memantine (NAMENDA) 5 MG tablet Take 5 mg by mouth daily.      . metoprolol (TOPROL-XL) 200 MG 24 hr tablet Take 1 tablet (200 mg total) by mouth daily.  90 tablet  3  . niacin 500 MG tablet Take 500 mg by mouth daily with breakfast.       . nitroGLYCERIN (NITROSTAT) 0.3 MG SL tablet Place 1 tablet (0.3 mg total) under the tongue every 5 (five) minutes as needed. For chest pain  90 tablet  3  . pantoprazole (PROTONIX) 40 MG tablet Take 40 mg by mouth daily.      . pravastatin (PRAVACHOL) 20 MG tablet Take 20 mg by mouth daily.      . sitaGLIPtin (JANUVIA) 100 MG tablet Take 0.5 tablets (50 mg total) by mouth daily.  45 tablet  3    Allergies  Allergen Reactions  . Codeine     Rash     Family History  Problem Relation Age of Onset  . Colon cancer Mother 34  . Breast cancer Mother    . Colon cancer Brother 58  . Breast cancer Sister   . Prostate cancer Brother   . Transient ischemic attack Mother   . Heart disease      3 brothers had MI; 1 pre 78  . Hypertension Sister     BP 128/72  Pulse 74  Temp 96.6 F (35.9 C) (Oral)  Ht 5\' 6"  (1.676 m)  Wt 182 lb (82.555 kg)  BMI 29.38 kg/m2  SpO2 97%    Review of Systems denies hypoglycemia    Objective:   Physical Exam VITAL SIGNS:  See vs page GENERAL: no distress      Assessment & Plan:

## 2012-07-30 ENCOUNTER — Encounter: Payer: Self-pay | Admitting: Endocrinology

## 2012-07-30 MED ORDER — NATEGLINIDE 120 MG PO TABS: 120.0000 mg | ORAL_TABLET | Freq: Three times a day (TID) | ORAL | Status: DC

## 2012-08-01 DIAGNOSIS — Z8673 Personal history of transient ischemic attack (TIA), and cerebral infarction without residual deficits: Secondary | ICD-10-CM | POA: Diagnosis not present

## 2012-08-01 DIAGNOSIS — R413 Other amnesia: Secondary | ICD-10-CM | POA: Diagnosis not present

## 2012-08-01 DIAGNOSIS — E119 Type 2 diabetes mellitus without complications: Secondary | ICD-10-CM | POA: Diagnosis not present

## 2012-08-01 DIAGNOSIS — I10 Essential (primary) hypertension: Secondary | ICD-10-CM | POA: Diagnosis not present

## 2012-08-01 DIAGNOSIS — R269 Unspecified abnormalities of gait and mobility: Secondary | ICD-10-CM | POA: Diagnosis not present

## 2012-08-01 DIAGNOSIS — I2510 Atherosclerotic heart disease of native coronary artery without angina pectoris: Secondary | ICD-10-CM | POA: Diagnosis not present

## 2012-08-02 ENCOUNTER — Telehealth: Payer: Self-pay | Admitting: *Deleted

## 2012-08-02 NOTE — Telephone Encounter (Signed)
Called pt to inform of lab results, pt's son in law informed (letter also mailed to pt).

## 2012-08-04 DIAGNOSIS — I10 Essential (primary) hypertension: Secondary | ICD-10-CM | POA: Diagnosis not present

## 2012-08-04 DIAGNOSIS — R269 Unspecified abnormalities of gait and mobility: Secondary | ICD-10-CM | POA: Diagnosis not present

## 2012-08-04 DIAGNOSIS — R413 Other amnesia: Secondary | ICD-10-CM | POA: Diagnosis not present

## 2012-08-04 DIAGNOSIS — E119 Type 2 diabetes mellitus without complications: Secondary | ICD-10-CM | POA: Diagnosis not present

## 2012-08-04 DIAGNOSIS — Z8673 Personal history of transient ischemic attack (TIA), and cerebral infarction without residual deficits: Secondary | ICD-10-CM | POA: Diagnosis not present

## 2012-08-04 DIAGNOSIS — I2510 Atherosclerotic heart disease of native coronary artery without angina pectoris: Secondary | ICD-10-CM | POA: Diagnosis not present

## 2012-08-08 ENCOUNTER — Telehealth: Payer: Self-pay | Admitting: Internal Medicine

## 2012-08-08 DIAGNOSIS — Z8673 Personal history of transient ischemic attack (TIA), and cerebral infarction without residual deficits: Secondary | ICD-10-CM | POA: Diagnosis not present

## 2012-08-08 DIAGNOSIS — R413 Other amnesia: Secondary | ICD-10-CM | POA: Diagnosis not present

## 2012-08-08 DIAGNOSIS — I2510 Atherosclerotic heart disease of native coronary artery without angina pectoris: Secondary | ICD-10-CM | POA: Diagnosis not present

## 2012-08-08 DIAGNOSIS — R269 Unspecified abnormalities of gait and mobility: Secondary | ICD-10-CM | POA: Diagnosis not present

## 2012-08-08 DIAGNOSIS — I10 Essential (primary) hypertension: Secondary | ICD-10-CM | POA: Diagnosis not present

## 2012-08-08 DIAGNOSIS — E119 Type 2 diabetes mellitus without complications: Secondary | ICD-10-CM | POA: Diagnosis not present

## 2012-08-08 NOTE — Telephone Encounter (Signed)
[facility] [address] dtr requesting written rx for Digoxin 90 day supply, dtr gail [phone] ok to leave message, will pick up

## 2012-08-09 DIAGNOSIS — R269 Unspecified abnormalities of gait and mobility: Secondary | ICD-10-CM | POA: Diagnosis not present

## 2012-08-09 DIAGNOSIS — I10 Essential (primary) hypertension: Secondary | ICD-10-CM | POA: Diagnosis not present

## 2012-08-09 DIAGNOSIS — E119 Type 2 diabetes mellitus without complications: Secondary | ICD-10-CM | POA: Diagnosis not present

## 2012-08-09 DIAGNOSIS — Z8673 Personal history of transient ischemic attack (TIA), and cerebral infarction without residual deficits: Secondary | ICD-10-CM | POA: Diagnosis not present

## 2012-08-09 DIAGNOSIS — R413 Other amnesia: Secondary | ICD-10-CM | POA: Diagnosis not present

## 2012-08-09 DIAGNOSIS — I2510 Atherosclerotic heart disease of native coronary artery without angina pectoris: Secondary | ICD-10-CM | POA: Diagnosis not present

## 2012-08-10 MED ORDER — DIGOXIN 125 MCG PO TABS: 125.0000 ug | ORAL_TABLET | ORAL | Status: DC

## 2012-08-10 NOTE — Telephone Encounter (Signed)
Called pt informed her I had sent her Digoxin 90 supply [id number] [address] [place].  She stated she would have her daughter pick it up after school.  Caralee Ates, CMA  LM for her daughter to inform her that her mothers medication had [facility] as requested.  Caralee Ates, CMA

## 2012-08-11 DIAGNOSIS — I2510 Atherosclerotic heart disease of native coronary artery without angina pectoris: Secondary | ICD-10-CM | POA: Diagnosis not present

## 2012-08-11 DIAGNOSIS — R269 Unspecified abnormalities of gait and mobility: Secondary | ICD-10-CM | POA: Diagnosis not present

## 2012-08-11 DIAGNOSIS — I10 Essential (primary) hypertension: Secondary | ICD-10-CM | POA: Diagnosis not present

## 2012-08-11 DIAGNOSIS — Z23 Encounter for immunization: Secondary | ICD-10-CM | POA: Diagnosis not present

## 2012-08-11 DIAGNOSIS — Z8673 Personal history of transient ischemic attack (TIA), and cerebral infarction without residual deficits: Secondary | ICD-10-CM | POA: Diagnosis not present

## 2012-08-11 DIAGNOSIS — R413 Other amnesia: Secondary | ICD-10-CM | POA: Diagnosis not present

## 2012-08-11 DIAGNOSIS — E119 Type 2 diabetes mellitus without complications: Secondary | ICD-10-CM | POA: Diagnosis not present

## 2012-08-15 DIAGNOSIS — R413 Other amnesia: Secondary | ICD-10-CM | POA: Diagnosis not present

## 2012-08-15 DIAGNOSIS — I2510 Atherosclerotic heart disease of native coronary artery without angina pectoris: Secondary | ICD-10-CM | POA: Diagnosis not present

## 2012-08-15 DIAGNOSIS — E119 Type 2 diabetes mellitus without complications: Secondary | ICD-10-CM | POA: Diagnosis not present

## 2012-08-15 DIAGNOSIS — R269 Unspecified abnormalities of gait and mobility: Secondary | ICD-10-CM | POA: Diagnosis not present

## 2012-08-15 DIAGNOSIS — I10 Essential (primary) hypertension: Secondary | ICD-10-CM | POA: Diagnosis not present

## 2012-08-15 DIAGNOSIS — Z8673 Personal history of transient ischemic attack (TIA), and cerebral infarction without residual deficits: Secondary | ICD-10-CM | POA: Diagnosis not present

## 2012-08-18 DIAGNOSIS — R413 Other amnesia: Secondary | ICD-10-CM | POA: Diagnosis not present

## 2012-08-18 DIAGNOSIS — I2510 Atherosclerotic heart disease of native coronary artery without angina pectoris: Secondary | ICD-10-CM | POA: Diagnosis not present

## 2012-08-18 DIAGNOSIS — R269 Unspecified abnormalities of gait and mobility: Secondary | ICD-10-CM | POA: Diagnosis not present

## 2012-08-18 DIAGNOSIS — Z8673 Personal history of transient ischemic attack (TIA), and cerebral infarction without residual deficits: Secondary | ICD-10-CM | POA: Diagnosis not present

## 2012-08-18 DIAGNOSIS — E119 Type 2 diabetes mellitus without complications: Secondary | ICD-10-CM | POA: Diagnosis not present

## 2012-08-18 DIAGNOSIS — I10 Essential (primary) hypertension: Secondary | ICD-10-CM | POA: Diagnosis not present

## 2012-08-23 DIAGNOSIS — I2510 Atherosclerotic heart disease of native coronary artery without angina pectoris: Secondary | ICD-10-CM | POA: Diagnosis not present

## 2012-08-23 DIAGNOSIS — E119 Type 2 diabetes mellitus without complications: Secondary | ICD-10-CM | POA: Diagnosis not present

## 2012-08-23 DIAGNOSIS — R269 Unspecified abnormalities of gait and mobility: Secondary | ICD-10-CM | POA: Diagnosis not present

## 2012-08-23 DIAGNOSIS — I10 Essential (primary) hypertension: Secondary | ICD-10-CM | POA: Diagnosis not present

## 2012-08-23 DIAGNOSIS — R413 Other amnesia: Secondary | ICD-10-CM | POA: Diagnosis not present

## 2012-08-23 DIAGNOSIS — Z8673 Personal history of transient ischemic attack (TIA), and cerebral infarction without residual deficits: Secondary | ICD-10-CM | POA: Diagnosis not present

## 2012-08-25 DIAGNOSIS — Z8673 Personal history of transient ischemic attack (TIA), and cerebral infarction without residual deficits: Secondary | ICD-10-CM | POA: Diagnosis not present

## 2012-08-25 DIAGNOSIS — I2510 Atherosclerotic heart disease of native coronary artery without angina pectoris: Secondary | ICD-10-CM | POA: Diagnosis not present

## 2012-08-25 DIAGNOSIS — I10 Essential (primary) hypertension: Secondary | ICD-10-CM | POA: Diagnosis not present

## 2012-08-25 DIAGNOSIS — R413 Other amnesia: Secondary | ICD-10-CM | POA: Diagnosis not present

## 2012-08-25 DIAGNOSIS — E119 Type 2 diabetes mellitus without complications: Secondary | ICD-10-CM | POA: Diagnosis not present

## 2012-08-25 DIAGNOSIS — R269 Unspecified abnormalities of gait and mobility: Secondary | ICD-10-CM | POA: Diagnosis not present

## 2012-08-29 DIAGNOSIS — Z950 Presence of cardiac pacemaker: Secondary | ICD-10-CM | POA: Diagnosis not present

## 2012-08-30 DIAGNOSIS — I2510 Atherosclerotic heart disease of native coronary artery without angina pectoris: Secondary | ICD-10-CM | POA: Diagnosis not present

## 2012-08-30 DIAGNOSIS — Z8673 Personal history of transient ischemic attack (TIA), and cerebral infarction without residual deficits: Secondary | ICD-10-CM | POA: Diagnosis not present

## 2012-08-30 DIAGNOSIS — R413 Other amnesia: Secondary | ICD-10-CM | POA: Diagnosis not present

## 2012-08-30 DIAGNOSIS — E119 Type 2 diabetes mellitus without complications: Secondary | ICD-10-CM | POA: Diagnosis not present

## 2012-08-30 DIAGNOSIS — I10 Essential (primary) hypertension: Secondary | ICD-10-CM | POA: Diagnosis not present

## 2012-08-30 DIAGNOSIS — R269 Unspecified abnormalities of gait and mobility: Secondary | ICD-10-CM | POA: Diagnosis not present

## 2012-08-31 DIAGNOSIS — I2510 Atherosclerotic heart disease of native coronary artery without angina pectoris: Secondary | ICD-10-CM | POA: Diagnosis not present

## 2012-08-31 DIAGNOSIS — R413 Other amnesia: Secondary | ICD-10-CM | POA: Diagnosis not present

## 2012-08-31 DIAGNOSIS — R269 Unspecified abnormalities of gait and mobility: Secondary | ICD-10-CM | POA: Diagnosis not present

## 2012-08-31 DIAGNOSIS — Z8673 Personal history of transient ischemic attack (TIA), and cerebral infarction without residual deficits: Secondary | ICD-10-CM | POA: Diagnosis not present

## 2012-08-31 DIAGNOSIS — I10 Essential (primary) hypertension: Secondary | ICD-10-CM | POA: Diagnosis not present

## 2012-08-31 DIAGNOSIS — E119 Type 2 diabetes mellitus without complications: Secondary | ICD-10-CM | POA: Diagnosis not present

## 2012-09-01 DIAGNOSIS — I2510 Atherosclerotic heart disease of native coronary artery without angina pectoris: Secondary | ICD-10-CM | POA: Diagnosis not present

## 2012-09-01 DIAGNOSIS — E119 Type 2 diabetes mellitus without complications: Secondary | ICD-10-CM | POA: Diagnosis not present

## 2012-09-01 DIAGNOSIS — R269 Unspecified abnormalities of gait and mobility: Secondary | ICD-10-CM | POA: Diagnosis not present

## 2012-09-01 DIAGNOSIS — I10 Essential (primary) hypertension: Secondary | ICD-10-CM | POA: Diagnosis not present

## 2012-09-01 DIAGNOSIS — Z8673 Personal history of transient ischemic attack (TIA), and cerebral infarction without residual deficits: Secondary | ICD-10-CM | POA: Diagnosis not present

## 2012-09-01 DIAGNOSIS — R413 Other amnesia: Secondary | ICD-10-CM | POA: Diagnosis not present

## 2012-09-05 DIAGNOSIS — Z8673 Personal history of transient ischemic attack (TIA), and cerebral infarction without residual deficits: Secondary | ICD-10-CM | POA: Diagnosis not present

## 2012-09-05 DIAGNOSIS — I10 Essential (primary) hypertension: Secondary | ICD-10-CM | POA: Diagnosis not present

## 2012-09-05 DIAGNOSIS — I2510 Atherosclerotic heart disease of native coronary artery without angina pectoris: Secondary | ICD-10-CM | POA: Diagnosis not present

## 2012-09-05 DIAGNOSIS — R413 Other amnesia: Secondary | ICD-10-CM | POA: Diagnosis not present

## 2012-09-05 DIAGNOSIS — E119 Type 2 diabetes mellitus without complications: Secondary | ICD-10-CM | POA: Diagnosis not present

## 2012-09-05 DIAGNOSIS — R269 Unspecified abnormalities of gait and mobility: Secondary | ICD-10-CM | POA: Diagnosis not present

## 2012-09-07 ENCOUNTER — Telehealth: Payer: Self-pay | Admitting: *Deleted

## 2012-09-07 DIAGNOSIS — E119 Type 2 diabetes mellitus without complications: Secondary | ICD-10-CM | POA: Diagnosis not present

## 2012-09-07 DIAGNOSIS — R269 Unspecified abnormalities of gait and mobility: Secondary | ICD-10-CM | POA: Diagnosis not present

## 2012-09-07 DIAGNOSIS — Z8673 Personal history of transient ischemic attack (TIA), and cerebral infarction without residual deficits: Secondary | ICD-10-CM | POA: Diagnosis not present

## 2012-09-07 DIAGNOSIS — I2510 Atherosclerotic heart disease of native coronary artery without angina pectoris: Secondary | ICD-10-CM | POA: Diagnosis not present

## 2012-09-07 DIAGNOSIS — R413 Other amnesia: Secondary | ICD-10-CM | POA: Diagnosis not present

## 2012-09-07 DIAGNOSIS — I10 Essential (primary) hypertension: Secondary | ICD-10-CM | POA: Diagnosis not present

## 2012-09-07 NOTE — Telephone Encounter (Signed)
Verbal order given  

## 2012-09-07 NOTE — Telephone Encounter (Signed)
[name] with [facility] needs a verbal order to continue home health OT & PT 2 times a week. Please advise.

## 2012-09-07 NOTE — Telephone Encounter (Signed)
OK ; use present codes

## 2012-09-08 DIAGNOSIS — I2510 Atherosclerotic heart disease of native coronary artery without angina pectoris: Secondary | ICD-10-CM | POA: Diagnosis not present

## 2012-09-08 DIAGNOSIS — E119 Type 2 diabetes mellitus without complications: Secondary | ICD-10-CM | POA: Diagnosis not present

## 2012-09-08 DIAGNOSIS — R413 Other amnesia: Secondary | ICD-10-CM | POA: Diagnosis not present

## 2012-09-08 DIAGNOSIS — I10 Essential (primary) hypertension: Secondary | ICD-10-CM | POA: Diagnosis not present

## 2012-09-08 DIAGNOSIS — Z8673 Personal history of transient ischemic attack (TIA), and cerebral infarction without residual deficits: Secondary | ICD-10-CM | POA: Diagnosis not present

## 2012-09-08 DIAGNOSIS — R269 Unspecified abnormalities of gait and mobility: Secondary | ICD-10-CM | POA: Diagnosis not present

## 2012-09-09 ENCOUNTER — Other Ambulatory Visit: Payer: Self-pay | Admitting: General Practice

## 2012-09-09 ENCOUNTER — Telehealth: Payer: Self-pay | Admitting: Internal Medicine

## 2012-09-09 DIAGNOSIS — Z5189 Encounter for other specified aftercare: Secondary | ICD-10-CM | POA: Diagnosis not present

## 2012-09-09 DIAGNOSIS — E119 Type 2 diabetes mellitus without complications: Secondary | ICD-10-CM | POA: Diagnosis not present

## 2012-09-09 DIAGNOSIS — R413 Other amnesia: Secondary | ICD-10-CM | POA: Diagnosis not present

## 2012-09-09 DIAGNOSIS — I2510 Atherosclerotic heart disease of native coronary artery without angina pectoris: Secondary | ICD-10-CM | POA: Diagnosis not present

## 2012-09-09 DIAGNOSIS — I10 Essential (primary) hypertension: Secondary | ICD-10-CM | POA: Diagnosis not present

## 2012-09-09 DIAGNOSIS — R269 Unspecified abnormalities of gait and mobility: Secondary | ICD-10-CM | POA: Diagnosis not present

## 2012-09-09 MED ORDER — NATEGLINIDE 120 MG PO TABS: 120.0000 mg | ORAL_TABLET | Freq: Three times a day (TID) | ORAL | Status: DC

## 2012-09-09 MED ORDER — PRAVASTATIN SODIUM 20 MG PO TABS: 20.0000 mg | ORAL_TABLET | Freq: Every day | ORAL | Status: DC

## 2012-09-09 NOTE — Telephone Encounter (Signed)
Received request for STARLIX to be prescribed for 90 day supply. Is this ok, last refill was sent in on 9/7 for a one month supply. Pt last seen on 07/29/12.

## 2012-09-09 NOTE — Telephone Encounter (Signed)
Pt daughter states they have switched pharmacies and need 90 day supply of Pravastatin [facility] [address]

## 2012-09-12 DIAGNOSIS — R413 Other amnesia: Secondary | ICD-10-CM | POA: Diagnosis not present

## 2012-09-12 DIAGNOSIS — Z5189 Encounter for other specified aftercare: Secondary | ICD-10-CM | POA: Diagnosis not present

## 2012-09-12 DIAGNOSIS — R269 Unspecified abnormalities of gait and mobility: Secondary | ICD-10-CM | POA: Diagnosis not present

## 2012-09-12 DIAGNOSIS — I2510 Atherosclerotic heart disease of native coronary artery without angina pectoris: Secondary | ICD-10-CM | POA: Diagnosis not present

## 2012-09-12 DIAGNOSIS — I10 Essential (primary) hypertension: Secondary | ICD-10-CM | POA: Diagnosis not present

## 2012-09-12 DIAGNOSIS — E119 Type 2 diabetes mellitus without complications: Secondary | ICD-10-CM | POA: Diagnosis not present

## 2012-09-13 ENCOUNTER — Other Ambulatory Visit: Payer: Self-pay

## 2012-09-13 MED ORDER — FENOFIBRATE MICRONIZED 134 MG PO CAPS: 134.0000 mg | ORAL_CAPSULE | Freq: Every day | ORAL | Status: DC

## 2012-09-13 NOTE — Telephone Encounter (Signed)
Rx sent pt daughter aware.    MW

## 2012-09-14 DIAGNOSIS — E119 Type 2 diabetes mellitus without complications: Secondary | ICD-10-CM | POA: Diagnosis not present

## 2012-09-14 DIAGNOSIS — Z5189 Encounter for other specified aftercare: Secondary | ICD-10-CM | POA: Diagnosis not present

## 2012-09-14 DIAGNOSIS — R413 Other amnesia: Secondary | ICD-10-CM | POA: Diagnosis not present

## 2012-09-14 DIAGNOSIS — I10 Essential (primary) hypertension: Secondary | ICD-10-CM | POA: Diagnosis not present

## 2012-09-14 DIAGNOSIS — R269 Unspecified abnormalities of gait and mobility: Secondary | ICD-10-CM | POA: Diagnosis not present

## 2012-09-14 DIAGNOSIS — I2510 Atherosclerotic heart disease of native coronary artery without angina pectoris: Secondary | ICD-10-CM | POA: Diagnosis not present

## 2012-09-16 DIAGNOSIS — E119 Type 2 diabetes mellitus without complications: Secondary | ICD-10-CM | POA: Diagnosis not present

## 2012-09-16 DIAGNOSIS — Z5189 Encounter for other specified aftercare: Secondary | ICD-10-CM | POA: Diagnosis not present

## 2012-09-16 DIAGNOSIS — R413 Other amnesia: Secondary | ICD-10-CM | POA: Diagnosis not present

## 2012-09-16 DIAGNOSIS — I10 Essential (primary) hypertension: Secondary | ICD-10-CM | POA: Diagnosis not present

## 2012-09-16 DIAGNOSIS — I2510 Atherosclerotic heart disease of native coronary artery without angina pectoris: Secondary | ICD-10-CM | POA: Diagnosis not present

## 2012-09-16 DIAGNOSIS — R269 Unspecified abnormalities of gait and mobility: Secondary | ICD-10-CM | POA: Diagnosis not present

## 2012-09-19 DIAGNOSIS — I2510 Atherosclerotic heart disease of native coronary artery without angina pectoris: Secondary | ICD-10-CM | POA: Diagnosis not present

## 2012-09-19 DIAGNOSIS — R413 Other amnesia: Secondary | ICD-10-CM | POA: Diagnosis not present

## 2012-09-19 DIAGNOSIS — R269 Unspecified abnormalities of gait and mobility: Secondary | ICD-10-CM | POA: Diagnosis not present

## 2012-09-19 DIAGNOSIS — Z5189 Encounter for other specified aftercare: Secondary | ICD-10-CM | POA: Diagnosis not present

## 2012-09-19 DIAGNOSIS — I10 Essential (primary) hypertension: Secondary | ICD-10-CM | POA: Diagnosis not present

## 2012-09-19 DIAGNOSIS — E119 Type 2 diabetes mellitus without complications: Secondary | ICD-10-CM | POA: Diagnosis not present

## 2012-09-20 DIAGNOSIS — R269 Unspecified abnormalities of gait and mobility: Secondary | ICD-10-CM | POA: Diagnosis not present

## 2012-09-20 DIAGNOSIS — Z5189 Encounter for other specified aftercare: Secondary | ICD-10-CM | POA: Diagnosis not present

## 2012-09-20 DIAGNOSIS — I10 Essential (primary) hypertension: Secondary | ICD-10-CM | POA: Diagnosis not present

## 2012-09-20 DIAGNOSIS — E119 Type 2 diabetes mellitus without complications: Secondary | ICD-10-CM | POA: Diagnosis not present

## 2012-09-20 DIAGNOSIS — I2510 Atherosclerotic heart disease of native coronary artery without angina pectoris: Secondary | ICD-10-CM | POA: Diagnosis not present

## 2012-09-20 DIAGNOSIS — R413 Other amnesia: Secondary | ICD-10-CM | POA: Diagnosis not present

## 2012-09-21 DIAGNOSIS — R413 Other amnesia: Secondary | ICD-10-CM | POA: Diagnosis not present

## 2012-09-21 DIAGNOSIS — I2510 Atherosclerotic heart disease of native coronary artery without angina pectoris: Secondary | ICD-10-CM | POA: Diagnosis not present

## 2012-09-21 DIAGNOSIS — R269 Unspecified abnormalities of gait and mobility: Secondary | ICD-10-CM | POA: Diagnosis not present

## 2012-09-21 DIAGNOSIS — Z5189 Encounter for other specified aftercare: Secondary | ICD-10-CM | POA: Diagnosis not present

## 2012-09-21 DIAGNOSIS — E119 Type 2 diabetes mellitus without complications: Secondary | ICD-10-CM | POA: Diagnosis not present

## 2012-09-21 DIAGNOSIS — I10 Essential (primary) hypertension: Secondary | ICD-10-CM | POA: Diagnosis not present

## 2012-09-23 DIAGNOSIS — R269 Unspecified abnormalities of gait and mobility: Secondary | ICD-10-CM | POA: Diagnosis not present

## 2012-09-23 DIAGNOSIS — I2510 Atherosclerotic heart disease of native coronary artery without angina pectoris: Secondary | ICD-10-CM | POA: Diagnosis not present

## 2012-09-23 DIAGNOSIS — Z5189 Encounter for other specified aftercare: Secondary | ICD-10-CM | POA: Diagnosis not present

## 2012-09-23 DIAGNOSIS — E119 Type 2 diabetes mellitus without complications: Secondary | ICD-10-CM | POA: Diagnosis not present

## 2012-09-23 DIAGNOSIS — I10 Essential (primary) hypertension: Secondary | ICD-10-CM | POA: Diagnosis not present

## 2012-09-23 DIAGNOSIS — R413 Other amnesia: Secondary | ICD-10-CM | POA: Diagnosis not present

## 2012-09-26 DIAGNOSIS — I10 Essential (primary) hypertension: Secondary | ICD-10-CM | POA: Diagnosis not present

## 2012-09-26 DIAGNOSIS — E119 Type 2 diabetes mellitus without complications: Secondary | ICD-10-CM | POA: Diagnosis not present

## 2012-09-26 DIAGNOSIS — R269 Unspecified abnormalities of gait and mobility: Secondary | ICD-10-CM | POA: Diagnosis not present

## 2012-09-26 DIAGNOSIS — Z5189 Encounter for other specified aftercare: Secondary | ICD-10-CM | POA: Diagnosis not present

## 2012-09-26 DIAGNOSIS — R413 Other amnesia: Secondary | ICD-10-CM | POA: Diagnosis not present

## 2012-09-26 DIAGNOSIS — I2510 Atherosclerotic heart disease of native coronary artery without angina pectoris: Secondary | ICD-10-CM | POA: Diagnosis not present

## 2012-09-27 DIAGNOSIS — I2510 Atherosclerotic heart disease of native coronary artery without angina pectoris: Secondary | ICD-10-CM | POA: Diagnosis not present

## 2012-09-27 DIAGNOSIS — I10 Essential (primary) hypertension: Secondary | ICD-10-CM | POA: Diagnosis not present

## 2012-09-27 DIAGNOSIS — E119 Type 2 diabetes mellitus without complications: Secondary | ICD-10-CM | POA: Diagnosis not present

## 2012-09-27 DIAGNOSIS — R413 Other amnesia: Secondary | ICD-10-CM | POA: Diagnosis not present

## 2012-09-27 DIAGNOSIS — R269 Unspecified abnormalities of gait and mobility: Secondary | ICD-10-CM | POA: Diagnosis not present

## 2012-09-27 DIAGNOSIS — Z5189 Encounter for other specified aftercare: Secondary | ICD-10-CM | POA: Diagnosis not present

## 2012-09-29 DIAGNOSIS — I10 Essential (primary) hypertension: Secondary | ICD-10-CM | POA: Diagnosis not present

## 2012-09-29 DIAGNOSIS — Z5189 Encounter for other specified aftercare: Secondary | ICD-10-CM | POA: Diagnosis not present

## 2012-09-29 DIAGNOSIS — E119 Type 2 diabetes mellitus without complications: Secondary | ICD-10-CM | POA: Diagnosis not present

## 2012-09-29 DIAGNOSIS — I2510 Atherosclerotic heart disease of native coronary artery without angina pectoris: Secondary | ICD-10-CM | POA: Diagnosis not present

## 2012-09-29 DIAGNOSIS — R413 Other amnesia: Secondary | ICD-10-CM | POA: Diagnosis not present

## 2012-09-29 DIAGNOSIS — R269 Unspecified abnormalities of gait and mobility: Secondary | ICD-10-CM | POA: Diagnosis not present

## 2012-10-06 ENCOUNTER — Other Ambulatory Visit: Payer: Self-pay

## 2012-10-06 MED ORDER — PANTOPRAZOLE SODIUM 40 MG PO TBEC: 40.0000 mg | DELAYED_RELEASE_TABLET | Freq: Every day | ORAL | Status: DC

## 2012-10-06 MED ORDER — COLESEVELAM HCL 625 MG PO TABS: 1875.0000 mg | ORAL_TABLET | Freq: Two times a day (BID) | ORAL | Status: DC

## 2012-10-06 NOTE — Telephone Encounter (Signed)
Pt daughter called and requesting Protonix be sent to [facility] [address] no longer using [facility] script. Pt aware Rx sent 90 day supply.     MW

## 2012-10-09 ENCOUNTER — Other Ambulatory Visit: Payer: Self-pay | Admitting: Endocrinology

## 2012-10-10 DIAGNOSIS — M1710 Unilateral primary osteoarthritis, unspecified knee: Secondary | ICD-10-CM | POA: Diagnosis not present

## 2012-10-24 ENCOUNTER — Telehealth: Payer: Self-pay | Admitting: Internal Medicine

## 2012-10-24 ENCOUNTER — Telehealth: Payer: Self-pay | Admitting: Cardiology

## 2012-10-24 ENCOUNTER — Telehealth: Payer: Self-pay

## 2012-10-24 ENCOUNTER — Other Ambulatory Visit: Payer: Self-pay

## 2012-10-24 MED ORDER — LEVOTHYROXINE SODIUM 25 MCG PO TABS: 25.0000 ug | ORAL_TABLET | Freq: Every day | ORAL | Status: DC

## 2012-10-24 MED ORDER — SITAGLIPTIN PHOSPHATE 100 MG PO TABS: 50.0000 mg | ORAL_TABLET | Freq: Every day | ORAL | Status: DC

## 2012-10-24 NOTE — Telephone Encounter (Signed)
Pt needs refill of metoprolol, enalapril [facility] [address], will not be using mail order anymore

## 2012-10-24 NOTE — Telephone Encounter (Signed)
Rx request for a 90 day supply of synthroid to be sent to [facility] [address]     KP

## 2012-10-24 NOTE — Telephone Encounter (Signed)
Patient's daughter called stating that her mom need a refill of Januvia 100 mg take 0.5 mg(50mg )poqd [facility] [address]. Please assist.

## 2012-10-24 NOTE — Telephone Encounter (Signed)
Rx sent to pharmacy   

## 2012-10-27 MED ORDER — METOPROLOL SUCCINATE ER 200 MG PO TB24: 200.0000 mg | ORAL_TABLET | Freq: Every day | ORAL | Status: DC

## 2012-10-27 MED ORDER — ENALAPRIL MALEATE 20 MG PO TABS: 20.0000 mg | ORAL_TABLET | Freq: Every day | ORAL | Status: DC

## 2012-10-27 NOTE — Telephone Encounter (Signed)
RX sent into pharmacy, Pt notified.

## 2012-10-31 ENCOUNTER — Other Ambulatory Visit: Payer: Self-pay | Admitting: *Deleted

## 2012-10-31 MED ORDER — CLOPIDOGREL BISULFATE 75 MG PO TABS: 75.0000 mg | ORAL_TABLET | Freq: Every day | ORAL | Status: DC

## 2012-11-14 DIAGNOSIS — Z1231 Encounter for screening mammogram for malignant neoplasm of breast: Secondary | ICD-10-CM | POA: Diagnosis not present

## 2012-11-14 DIAGNOSIS — Z803 Family history of malignant neoplasm of breast: Secondary | ICD-10-CM | POA: Diagnosis not present

## 2012-11-24 ENCOUNTER — Encounter: Payer: Self-pay | Admitting: Internal Medicine

## 2012-11-24 ENCOUNTER — Ambulatory Visit (INDEPENDENT_AMBULATORY_CARE_PROVIDER_SITE_OTHER): Payer: Medicare Other | Admitting: Internal Medicine

## 2012-11-24 VITALS — BP 130/78 | HR 82 | Temp 97.4°F | Wt 188.0 lb

## 2012-11-24 DIAGNOSIS — I10 Essential (primary) hypertension: Secondary | ICD-10-CM | POA: Diagnosis not present

## 2012-11-24 DIAGNOSIS — E785 Hyperlipidemia, unspecified: Secondary | ICD-10-CM

## 2012-11-24 DIAGNOSIS — D649 Anemia, unspecified: Secondary | ICD-10-CM | POA: Diagnosis not present

## 2012-11-24 DIAGNOSIS — E039 Hypothyroidism, unspecified: Secondary | ICD-10-CM | POA: Diagnosis not present

## 2012-11-24 DIAGNOSIS — E1165 Type 2 diabetes mellitus with hyperglycemia: Secondary | ICD-10-CM

## 2012-11-24 DIAGNOSIS — K219 Gastro-esophageal reflux disease without esophagitis: Secondary | ICD-10-CM | POA: Diagnosis not present

## 2012-11-24 DIAGNOSIS — E1129 Type 2 diabetes mellitus with other diabetic kidney complication: Secondary | ICD-10-CM | POA: Diagnosis not present

## 2012-11-24 LAB — LIPID PANEL
Cholesterol: 217 mg/dL — ABNORMAL HIGH (ref 0–200)
Triglycerides: 386 mg/dL — ABNORMAL HIGH (ref 0.0–149.0)

## 2012-11-24 LAB — HEMOGLOBIN A1C: Hgb A1c MFr Bld: 8.9 % — ABNORMAL HIGH (ref 4.6–6.5)

## 2012-11-24 LAB — CBC WITH DIFFERENTIAL/PLATELET
Basophils Relative: 0.2 % (ref 0.0–3.0)
Eosinophils Absolute: 0.3 10*3/uL (ref 0.0–0.7)
Eosinophils Relative: 4.1 % (ref 0.0–5.0)
HCT: 33.5 % — ABNORMAL LOW (ref 36.0–46.0)
Lymphs Abs: 1.7 10*3/uL (ref 0.7–4.0)
MCHC: 33.7 g/dL (ref 30.0–36.0)
MCV: 86.2 fl (ref 78.0–100.0)
Monocytes Absolute: 0.7 10*3/uL (ref 0.1–1.0)
RBC: 3.88 Mil/uL (ref 3.87–5.11)
WBC: 6.9 10*3/uL (ref 4.5–10.5)

## 2012-11-24 LAB — HEPATIC FUNCTION PANEL
ALT: 23 U/L (ref 0–35)
Albumin: 4 g/dL (ref 3.5–5.2)
Total Protein: 7.2 g/dL (ref 6.0–8.3)

## 2012-11-24 LAB — MICROALBUMIN / CREATININE URINE RATIO
Creatinine,U: 28.1 mg/dL
Microalb Creat Ratio: 40.9 mg/g — ABNORMAL HIGH (ref 0.0–30.0)

## 2012-11-24 LAB — BASIC METABOLIC PANEL
BUN: 26 mg/dL — ABNORMAL HIGH (ref 6–23)
CO2: 23 mEq/L (ref 19–32)
Chloride: 102 mEq/L (ref 96–112)
Creatinine, Ser: 1.4 mg/dL — ABNORMAL HIGH (ref 0.4–1.2)
Potassium: 3.9 mEq/L (ref 3.5–5.1)

## 2012-11-24 NOTE — Patient Instructions (Addendum)
Please review the medication list in the After Visit Summary provided.Please write the name of the prescribing physician to the right of the medication and share this with all medical staff seen at each appointment. This will help provide continuity of care; help optimize therapeutic interventions;and help prevent drug:drug adverse reaction.  Review and correct the record as indicated. Please share record with all medical staff seen.   If you activate My Chart; the results can be released to you as soon as they populate from the lab. If you choose not to use this program; the labs have to be reviewed, copied & mailed   causing a delay in getting the results to you.

## 2012-11-24 NOTE — Assessment & Plan Note (Signed)
TSH was therapeutic at 1.92 on 04/14/12.

## 2012-11-24 NOTE — Assessment & Plan Note (Signed)
A1c will be checked ; appt with Dr Everardo All 11/28/12

## 2012-11-24 NOTE — Assessment & Plan Note (Signed)
Major issue remains elevated triglycerides. Nutritional interventions discussed. Repeat fasting lipids

## 2012-11-24 NOTE — Assessment & Plan Note (Signed)
Blood pressure appears well controlled. She's had mild renal insufficiency with a GFR of 51.84 on 07/29/12

## 2012-11-24 NOTE — Progress Notes (Signed)
Subjective:    Patient ID: [name], female    DOB: 1925/04/18, 79 y.o.   MRN: [id number]  HPI The patient is here for followup of  Medication management  In context of hypothyroidism,diabetes, hyperlipidemia, GERD and hypertension.  The most recent A1c  07/29/12  was  9.2% , which correlates to an average sugar of 218  , and long-term risk of 84 % . Fasting blood sugar ranges  110- 208 ( response hesitant)   .  Two-hour postprandial glucose is  Not checked . No hypoglycemia Last ophthalmologic examination  Summer 2013 revealed no retinopathy. No active podiatry assessment on record. Diet is "kinda Weight Watchers" . Exercise : "not much" except with Physical Therapy  . S/P knee steroid injection 10/24/12 by Dr Netta Corrigan, Ortho  The most recent lipids  06/22/12  reveal LDL  114 , HDL 56.7 , and triglycerides 252   . There is medical compliance with the statin.  Blood pressure range   is   < 140/90 . There is medical compliance with antihypertensive medications          Review of Systems  Constitutional: No fever, chills, significant weight change, fatigue, weakness or night sweats Eyes: No  blurred vision, double vision, or loss of vision since CVA Cardiovascular: no chest pain, palpitations, racing (except with exercise), irregular rhythm,syncope,nausea,sweating,or  Claudication.Improvement in edema with recliner  Respiratory: No exertinal dyspnea, paroxysmal nocturnal dyspnea Gastrointestinal: No heartburn,dysphagia, diarrhea,  rectal bleeding, melena. Softener helps constipation Musculoskeletal: No myalgias or muscle cramping , weakness, or cyanosis Dermatologic: No rash, pruritus, urticaria, or change in color or temperature of skin Neurologic: Some chronic feet numbness or tingling Endocrine: No change in hair/skin/ nails, excessive thirst, excessive hunger, excessive urination, or unexplained fatigue     Objective:   Physical Exam Gen.: Adequately nourished in  appearance. Alert, appropriate and cooperative throughout exam. Eyes: No corneal or conjunctival inflammation noted. Ptosis OS Mouth: Oral mucosa and oropharynx reveal no lesions or exudates. Teeth in good repair. Upper & lower partials Neck: No deformities, masses, or tenderness noted.  Thyroid : firm , physiologically asymmetric Lungs: Normal respiratory effort; chest expands symmetrically. Lungs are clear to auscultation without rales, wheezes, or increased work of breathing. Heart: Normal rate and rhythm. Normal S1 and S2. No gallop, click, or rub. S4 w/o significant murmur. Abdomen: Bowel sounds normal; abdomen soft and nontender. No masses, organomegaly or hernias noted.Dullness RUQ                                                                                 Musculoskeletal/extremities: Minor degenerative joint changes in hands present. Fusiform changes of  the knees. Vascular: Carotid, radial artery, & dorsalis pedis  pulses are full and equal. No bruits present. Lipedema decreases PTP Neurologic: Alert and oriented x3. Deep tendon reflexes symmetrical but 0-1/2+ @ knees . Gait slow & broad; using 4 pronged cane        Skin: Intact without suspicious lesions or rashes. Lymph: No cervical, axillary lymphadenopathy present. Psych: Mood and affect are normal. Normally interactive  Assessment & Plan:

## 2012-11-24 NOTE — Assessment & Plan Note (Signed)
GERD is well controlled with the PPI. Her last hematocrit was 33.2 on 03/16/12.

## 2012-11-26 ENCOUNTER — Other Ambulatory Visit: Payer: Self-pay | Admitting: Internal Medicine

## 2012-11-28 ENCOUNTER — Telehealth: Payer: Self-pay

## 2012-11-28 ENCOUNTER — Encounter: Payer: Self-pay | Admitting: Endocrinology

## 2012-11-28 ENCOUNTER — Ambulatory Visit (INDEPENDENT_AMBULATORY_CARE_PROVIDER_SITE_OTHER): Payer: Medicare Other | Admitting: Endocrinology

## 2012-11-28 VITALS — BP 120/70 | HR 82 | Wt 187.0 lb

## 2012-11-28 DIAGNOSIS — E1129 Type 2 diabetes mellitus with other diabetic kidney complication: Secondary | ICD-10-CM

## 2012-11-28 DIAGNOSIS — E1165 Type 2 diabetes mellitus with hyperglycemia: Secondary | ICD-10-CM | POA: Diagnosis not present

## 2012-11-28 MED ORDER — INSULIN DETEMIR 100 UNIT/ML ~~LOC~~ SOLN: 10.0000 [IU] | SUBCUTANEOUS | Status: DC

## 2012-11-28 NOTE — Telephone Encounter (Signed)
Message copied by Maurice Small on Mon Nov 28, 2012  8:22 AM ------      Message from: Pecola Lawless      Created: Sat Nov 26, 2012  1:24 PM       A1c assesses average 24 hour  glucose over prior 6-12 weeks. A1c improved BUT GOALS:       Good diabetic control: 6.5-7 %       Fair diabetic control: 7-8 %       Poor diabetic control: greater than 8 % ( except with additional factors such as  advanced age; significant coronary or neurologic disease,etc). Check the A1c@ least  every  4 months . Goals for home glucose monitoring are : fasting  or morning glucose goal of  100-150. Two hours after any meal , goal = < 180, preferably < 160.      Report any low blood glucoses immediately.      Urine microalbumin assesses  microscopic kidney disease from hypertension or Diabetes; Diabetes & Hypertension control will reverse any elevation.Please take your diary of glucoses ; blood pressure readings & all actual pill bottles to appt with Dr Everardo All. Please review the medication list in the After Visit Summary provided.PLEASE write the name of the prescribing physician to the right of the medication and share this with all medical staff seen at each appointment. This will help provide continuity of care; help optimize therapeutic interventions;and help prevent drug:drug adverse reaction.        With Diabetes LDL or BAD cholesterol goal = < 100, ideally < 70.        The most common cause of elevated triglycerides is the ingestion of sugar from high fructose corn syrup sources added to processed foods & drinks.  Eat a low-fat diet with lots of fruits and vegetables, up to 7-9 servings per day. Consume less than 30 (preferably ZERO) grams of sugar per day from foods & drinks with High Fructose Corn Syrup (HFCS) sugar as #1,2,3 or # 4 on label.Whole Foods, [employer] Joes & [employer] do not carry products with HFCS.      Follow a  low carb nutrition program such as [place] or [employer] Sugar [employer]  to prevent  Diabetes progression . White carbohydrates (potatoes, rice, bread, and pasta) have a high spike of sugar and a high load of sugar. For example a  baked potato has a cup of sugar and a  french fry  2 teaspoons of sugar. Yams, wild  rice, whole grained bread &  wheat pasta have been much lower spike and load of  sugar. Portions should be the size of a deck of cards or your palm.        There is mild reduction in Alkaline Phosphatase.Dietary sources of  Alk Phos include whole grains & nuts. Alk Phos is important for optimal liver & bone health.            Anemia is present; but it is stable to improved.        BUN, creatinine, and GFR  all assess kidney function. To protect the kidneys it  is important to control your blood pressure and sugar. You should also stay well hydrated. Drink to thirst, up to 32 ounces of fluids a day. Progressive kidney impairment is typically associated with anemia which is unrelated to  iron deficiency or B12 deficiency.      Please increase your thyroid pill to 1 1/2 pills daily. Check  the TSH in 10 - 12 weeks (Code: 244.9). Please stop the Januvia until you are seen by Dr. Everardo All as it is metabolized through the kidneys. [employer]

## 2012-11-28 NOTE — Progress Notes (Signed)
Subjective:    Patient ID: [name], female    DOB: 12/24/1924, 79 y.o.   MRN: [id number]  HPI Pt returns for f/u of type 2 DM (dx'ed 2001; complicated by CVA, renal insuff, and CAD).  She is not on metformin, due to chf, she is not on actos, due to edema; parlodel was sopped by her neurologist, after her cva; she is on minimal amaryl, due to renal insuff; she is taking invokana despite the cost).  she brings a record of her cbg's which i have reviewed today.  It varies from the 100's to the low-200's. There is no trend throughout the day.  dtr and pt are hesitant for pt to take insulin, but pt agrees to start qd insulin now.   Past Medical History  Diagnosis Date  . Sliding hiatal hernia   . Erosive esophagitis   . Peptic stricture of esophagus   . Diverticulosis   . Arthritis   . Hypothyroidism   . Diabetes mellitus     Type I  . Coronary artery disease     s/p CABG 2001  . Sick sinus syndrome     s/p Medtronic DDD pacemaker implanted; generator change 02/2012  . Hypertension   . Hyperlipidemia   . GERD (gastroesophageal reflux disease)   . Adenomatous colon polyp 07/2002  . CVA (cerebral vascular accident)     Dr Debarah Crape, Neurology    Past Surgical History  Procedure Date  . Pacemaker placement 03/25/2000    by Dr Juanda Chance  . Tonsillectomy and adenoidectomy   . Appendectomy   . Cholecystectomy   . Total abdominal hysterectomy w/ bilateral salpingoophorectomy     Endometiosis  . Replacement total knee 2004    Right  . Colonoscopy w/ polypectomy 2003  . Cataract extraction 05/2005  . Coronary artery bypass graft 02/2000    5 vessel  . Rotator cuff repair   . Pacemaker generator replacement 03/2012    Dr. Johney Frame    History   Social History  . Marital Status: Widowed    Spouse Name: N/A    Number of Children: 1  . Years of Education: N/A   Occupational History  . Retired    Social History Main Topics  . Smoking status: Former Games developer  . Smokeless tobacco: Former  Neurosurgeon    Quit date: 11/23/1981     Comment: quit 28+ yrs ago (as of 2012)  . Alcohol Use: No  . Drug Use: No  . Sexually Active: No   Other Topics Concern  . Not on file   Social History Narrative   Lives with her grandson and his wife.Daily caffeine use.    Current Outpatient Prescriptions on File Prior to Visit  Medication Sig Dispense Refill  . Calcium Carbonate (CALCIUM 500 PO) Take 500 mg by mouth once.       . Canagliflozin (INVOKANA) 100 MG TABS Take 1 tablet by mouth daily.  90 tablet  3  . clopidogrel (PLAVIX) 75 MG tablet Take 1 tablet (75 mg total) by mouth daily.  90 tablet  2  . colesevelam (WELCHOL) 625 MG tablet Take 3 tablets (1,875 mg total) by mouth 2 (two) times daily with a meal.  540 tablet  1  . digoxin (LANOXIN) 0.125 MG tablet Take 1 tablet (125 mcg total) by mouth See admin instructions. One daily except 1 1/2 tab every Wed  102 tablet  1  . enalapril (VASOTEC) 20 MG tablet Take 1 tablet (20 mg total)  by mouth daily.  90 tablet  1  . fenofibrate micronized (LOFIBRA) 134 MG capsule TAKE 1 CAPSULE (134 MG TOTAL) BY MOUTH DAILY BEFORE BREAKFAST.  90 capsule  3  . fish oil-omega-3 fatty acids 1000 MG capsule Take 1 g by mouth daily.      . insulin detemir (LEVEMIR FLEXPEN) 100 UNIT/ML injection Inject 10 Units into the skin every morning. And pen needles 1/day  15 mL  12  . levothyroxine (SYNTHROID, LEVOTHROID) 25 MCG tablet Take 25 mcg by mouth. New Instruction as of 11/29/11: 1 1/2 by mouth daily      . memantine (NAMENDA) 5 MG tablet Take 5 mg by mouth daily.      . metoprolol (TOPROL-XL) 200 MG 24 hr tablet Take 1 tablet (200 mg total) by mouth daily.  90 tablet  1  . niacin 500 MG tablet Take 500 mg by mouth daily with breakfast.       . nitroGLYCERIN (NITROSTAT) 0.3 MG SL tablet Place 1 tablet (0.3 mg total) under the tongue every 5 (five) minutes as needed. For chest pain  90 tablet  3  . pantoprazole (PROTONIX) 40 MG tablet Take 1 tablet (40 mg total) by mouth  daily.  90 tablet  0  . pravastatin (PRAVACHOL) 20 MG tablet Take 1 tablet (20 mg total) by mouth daily.  90 tablet  1    Allergies  Allergen Reactions  . Codeine     Rash     Family History  Problem Relation Age of Onset  . Colon cancer Mother 45  . Breast cancer Mother   . Colon cancer Brother 33  . Breast cancer Sister   . Prostate cancer Brother   . Transient ischemic attack Mother   . Heart disease      3 brothers had MI; 1 pre 41  . Hypertension Sister     BP 120/70  Pulse 82  Wt 187 lb (84.823 kg)  SpO2 97%  Review of Systems denies hypoglycemia.      Objective:   Physical Exam VITAL SIGNS:  See vs page GENERAL: no distress PSYCH: Alert and oriented x 3.  Does not appear anxious nor depressed.    Lab Results  Component Value Date   HGBA1C 8.9* 11/24/2012      Assessment & Plan:  DM.  She needs and agrees to start insulin.

## 2012-11-28 NOTE — Patient Instructions (Addendum)
check your blood sugar twice a day.  vary the time of day when you check, between before the 3 meals, and at bedtime.  also check if you have symptoms of your blood sugar being too high or too low.  please keep a record of the readings and bring it to your next appointment here.  please call us sooner if your blood sugar goes below 70, or if you have a lot of readings over 200.  Refer to a diabetes education specialist, to learn about taking insulin.  you will receive a phone call, about a day and time for an appointment  Please come back for a follow-up appointment in 2-3 weeks.   Start "levemir," 10 units each morning.   Stop the [place] and nateglanide.

## 2012-12-06 ENCOUNTER — Encounter: Payer: Self-pay | Admitting: Internal Medicine

## 2012-12-31 ENCOUNTER — Other Ambulatory Visit: Payer: Self-pay | Admitting: Internal Medicine

## 2013-01-03 ENCOUNTER — Encounter: Payer: Self-pay | Admitting: Dietician

## 2013-01-03 ENCOUNTER — Encounter: Payer: Medicare Other | Attending: Endocrinology | Admitting: Dietician

## 2013-01-03 VITALS — Ht 67.0 in | Wt 182.4 lb

## 2013-01-03 DIAGNOSIS — E1165 Type 2 diabetes mellitus with hyperglycemia: Secondary | ICD-10-CM

## 2013-01-03 DIAGNOSIS — R350 Frequency of micturition: Secondary | ICD-10-CM | POA: Insufficient documentation

## 2013-01-03 DIAGNOSIS — E1129 Type 2 diabetes mellitus with other diabetic kidney complication: Secondary | ICD-10-CM

## 2013-01-03 DIAGNOSIS — E1169 Type 2 diabetes mellitus with other specified complication: Secondary | ICD-10-CM | POA: Diagnosis not present

## 2013-01-03 DIAGNOSIS — Z713 Dietary counseling and surveillance: Secondary | ICD-10-CM | POA: Insufficient documentation

## 2013-01-03 NOTE — Progress Notes (Signed)
Medical Nutrition Therapy:  Appt start time: 1500 end time:  1600.   Assessment:  Primary concerns today: Wants to know what she can do to not have to take insulin. She is accompanied by her daughter and son-in-law.  She notes that this daughter and grandchildren help to see that she is taken care of.  She reports that she is very independent, lives alone and wants to continue to live alone.  She feels "that living alone, it would not be safe for her at her age to be on insulin.  Something might go wrong in the middle of the night.  And, I don't want to have to go live with someone else."  Her daughter notes that while they want her to come and live with them, she realizes that her mother will insist that she remain in her home and independent.  Her current A1C is at 8.9%.  HYPER GLYCEMIA:  Gives the symptom of increased urination for high blood glucose.  HYPOGLYCEMIA:  Gives no report other than an occasional episode of being grumpy.  No glucose readings of low blood glucose.  BLOOD GLUCOSE MONITORING:  Monitoring 2 times per day and varies the times.  Fasting ranges: 173,213,219,211  AC Lunch:  233, 189, 267,197,141  After Lunch: 147, 193, 158  AC Dinner: 178, 182  HS: 165, 195  MEDICATIONS: Completed medication review.  Continues on oral meds for glucose control and has not started the Levemir insulin.   DIETARY INTAKE:  Usual eating pattern includes 2-3 meals and no snacks per day.  Everyday foods include few starchy foods, non-starchy veggies, some meat, dairy and fruit.  Avoided foods include Starches.and sugars and sweet foods.    24-hr recall:  B ( AM): 8:30-10:00  Cereal (cheerios, oatmeal, ) 1 cup milk skim, about 1 cup and 1/2 banana.   1 cup coffee with skim milk and Equal.  Oatmeal, package, plain add raisins.  Egg whites with grated cheese, toast 40 calories (20 with low fat butter   Snk ( AM): none  L ( PM): 12:00-2:00 plate of veggies or a green salad.  Cabbage, turnip  greens.  Water or tea sugar free. Rarely has a sandwich on occasion.  Dennis Killilea have a piece of fruit. Snk ( PM): none D ( PM): large salad, cukecumber, peppers, with boiled egg with chef salad,. OR veggie plate. Uses low fat salad dressing.Will make soup of chicken,  Snk ( PM): none Beverages: water, milk, unsweetened tea.    Usual physical activity: Not active.  Walks to [employer].  Trying to go to take the gargage out.   Estimated energy needs:  HT:67 in   WT: 182.4 lb  BMI: 28.6 kg/m2    1400 calories 150-155 g carbohydrates 100-105 g protein 36-38 g fat  Progress Towards Goal(s):  In progress.   Nutritional Diagnosis:  [place]-2.1 Inpaired nutrition utilization As related to blood glucose.  As evidenced by history of type 2 diabetes with increasing glucose levels, current A1C of 8.9% and needing to take insulin..    Intervention:  Nutrition Recommended that she more closely monitor her carb intake, especially her fruit intake.  She is not doing a bad job with her current intake.  Need to increase her protein intake.  Use the regular or lite salad dressings and not the fat free as they have carb added to them.  I tried to use the carb counting and translate carb counting into the point system that she is familiar with  at [employer] for the las 40-50 years. Advised her that she needs to increase her activity.  Needs to get back to regular chair exercises and to try to start to walk on a regular basis.  Try walking in stores when pushing a cart for support and to walk not shop.  Aim for goal of 20 minutes 3-4 times per week. Recommended 30-45 gm of carb for meals and 15 gm for a snack each day.  Handouts given during visit include:  Living Well with Diabetes  Carb Counting Guide by [employer] with diet prescription  Monitoring/Evaluation:  Dietary intake, exercise, blood glucose readings, and body weight in 8-12 weeks.Marland Kitchen

## 2013-01-04 ENCOUNTER — Encounter: Payer: Self-pay | Admitting: Dietician

## 2013-01-13 ENCOUNTER — Other Ambulatory Visit: Payer: Self-pay | Admitting: Family Medicine

## 2013-01-16 NOTE — Telephone Encounter (Signed)
TSH 244.9 

## 2013-03-06 ENCOUNTER — Other Ambulatory Visit: Payer: Self-pay | Admitting: Internal Medicine

## 2013-03-15 ENCOUNTER — Telehealth: Payer: Self-pay | Admitting: Internal Medicine

## 2013-03-15 DIAGNOSIS — R300 Dysuria: Secondary | ICD-10-CM | POA: Diagnosis not present

## 2013-03-15 DIAGNOSIS — N3090 Cystitis, unspecified without hematuria: Secondary | ICD-10-CM | POA: Diagnosis not present

## 2013-03-15 DIAGNOSIS — E119 Type 2 diabetes mellitus without complications: Secondary | ICD-10-CM | POA: Diagnosis not present

## 2013-03-15 NOTE — Telephone Encounter (Signed)
Noted, per Dr.hopper's protocol if patient with pending appointment, seen in ER or Urgent Care ok to close Encounter

## 2013-03-15 NOTE — Telephone Encounter (Signed)
Patient Information:  Caller Name: Michele Mcalpine  Phone: [phone]  Patient: [name], [name]  Gender: Female  DOB: 02/25/1925  Age: 79 Years  PCP: Marga Melnick  Office Follow Up:  Does the office need to follow up with this patient?: No  Instructions For The Office: N/A  RN Note:  RN returned call to Patient's Son-in-law/Phil. Son-in-Law states patient is currently being seen at the [facility] on [facility] for evaluation of urinary sx. Triage assessment not completed due to Patient currently being evaluated in UC.  Symptoms  Reason For Call & Symptoms: Urinary sx  Reviewed Health History In EMR: Yes  Reviewed Medications In EMR: Yes  Reviewed Allergies In EMR: Yes  Reviewed Surgeries / Procedures: Yes  Date of Onset of Symptoms: Unknown  Guideline(s) Used:  No Protocol Available - Sick Adult  Disposition Per Guideline:   See Today or Tomorrow in Office  Reason For Disposition Reached:   Nursing judgment  Advice Given:  N/A  Patient Refused Recommendation:  Patient Will Go To U.C.  Patient was currently being treated at Urgent Care when RN returned call to patient.

## 2013-03-16 ENCOUNTER — Other Ambulatory Visit: Payer: Self-pay | Admitting: Emergency Medicine

## 2013-03-16 MED ORDER — DIGOXIN 125 MCG PO TABS: 125.0000 ug | ORAL_TABLET | ORAL | Status: DC

## 2013-03-21 ENCOUNTER — Other Ambulatory Visit: Payer: Self-pay | Admitting: Internal Medicine

## 2013-03-21 DIAGNOSIS — N182 Chronic kidney disease, stage 2 (mild): Secondary | ICD-10-CM

## 2013-03-21 DIAGNOSIS — D649 Anemia, unspecified: Secondary | ICD-10-CM

## 2013-03-21 DIAGNOSIS — E039 Hypothyroidism, unspecified: Secondary | ICD-10-CM

## 2013-03-28 ENCOUNTER — Other Ambulatory Visit (INDEPENDENT_AMBULATORY_CARE_PROVIDER_SITE_OTHER): Payer: Medicare Other

## 2013-03-28 ENCOUNTER — Ambulatory Visit: Payer: Medicare Other | Admitting: Dietician

## 2013-03-28 DIAGNOSIS — E039 Hypothyroidism, unspecified: Secondary | ICD-10-CM | POA: Diagnosis not present

## 2013-03-28 DIAGNOSIS — N182 Chronic kidney disease, stage 2 (mild): Secondary | ICD-10-CM

## 2013-03-28 DIAGNOSIS — IMO0001 Reserved for inherently not codable concepts without codable children: Secondary | ICD-10-CM

## 2013-03-28 DIAGNOSIS — D649 Anemia, unspecified: Secondary | ICD-10-CM

## 2013-03-28 LAB — MICROALBUMIN / CREATININE URINE RATIO
Creatinine,U: 53.3 mg/dL
Microalb Creat Ratio: 24 mg/g (ref 0.0–30.0)

## 2013-03-28 LAB — CBC WITH DIFFERENTIAL/PLATELET
Basophils Absolute: 0 10*3/uL (ref 0.0–0.1)
Eosinophils Absolute: 0.2 10*3/uL (ref 0.0–0.7)
HCT: 35.2 % — ABNORMAL LOW (ref 36.0–46.0)
Hemoglobin: 12.1 g/dL (ref 12.0–15.0)
Lymphocytes Relative: 40.5 % (ref 12.0–46.0)
Lymphs Abs: 2.6 10*3/uL (ref 0.7–4.0)
MCHC: 34.5 g/dL (ref 30.0–36.0)
Neutro Abs: 3.1 10*3/uL (ref 1.4–7.7)
Platelets: 170 10*3/uL (ref 150.0–400.0)
RDW: 12.9 % (ref 11.5–14.6)

## 2013-03-28 LAB — BASIC METABOLIC PANEL
Calcium: 9.9 mg/dL (ref 8.4–10.5)
GFR: 37.64 mL/min — ABNORMAL LOW (ref >60.00)
Glucose, Bld: 171 mg/dL — ABNORMAL HIGH (ref 70–99)
Potassium: 3.9 mEq/L (ref 3.5–5.1)
Sodium: 137 mEq/L (ref 135–145)

## 2013-03-28 LAB — HEMOGLOBIN A1C: Hgb A1c MFr Bld: 8.6 % — ABNORMAL HIGH (ref 4.6–6.5)

## 2013-03-28 LAB — TSH: TSH: 1.41 u[IU]/mL (ref 0.35–5.50)

## 2013-04-03 ENCOUNTER — Ambulatory Visit (INDEPENDENT_AMBULATORY_CARE_PROVIDER_SITE_OTHER): Payer: Medicare Other | Admitting: Internal Medicine

## 2013-04-03 ENCOUNTER — Other Ambulatory Visit: Payer: Self-pay

## 2013-04-03 ENCOUNTER — Encounter: Payer: Self-pay | Admitting: Internal Medicine

## 2013-04-03 VITALS — BP 144/88 | HR 88 | Wt 175.0 lb

## 2013-04-03 DIAGNOSIS — E039 Hypothyroidism, unspecified: Secondary | ICD-10-CM | POA: Diagnosis not present

## 2013-04-03 DIAGNOSIS — R300 Dysuria: Secondary | ICD-10-CM

## 2013-04-03 DIAGNOSIS — E1165 Type 2 diabetes mellitus with hyperglycemia: Secondary | ICD-10-CM

## 2013-04-03 DIAGNOSIS — E1129 Type 2 diabetes mellitus with other diabetic kidney complication: Secondary | ICD-10-CM | POA: Diagnosis not present

## 2013-04-03 DIAGNOSIS — E785 Hyperlipidemia, unspecified: Secondary | ICD-10-CM | POA: Diagnosis not present

## 2013-04-03 LAB — POCT URINALYSIS DIPSTICK
Bilirubin, UA: NEGATIVE
Glucose, UA: 2000
Ketones, UA: NEGATIVE
Spec Grav, UA: 1.01

## 2013-04-03 MED ORDER — COLESEVELAM HCL 625 MG PO TABS: 1875.0000 mg | ORAL_TABLET | Freq: Two times a day (BID) | ORAL | Status: DC

## 2013-04-03 MED ORDER — ATORVASTATIN CALCIUM 20 MG PO TABS: 20.0000 mg | ORAL_TABLET | Freq: Every day | ORAL | Status: DC

## 2013-04-03 NOTE — Assessment & Plan Note (Signed)
It was reinforced that the major goal is to avoid hypoglycemia at all costs.  Basal insulin would be a much simpler program and more cost effective; I'll ask her to discuss this with Dr. Everardo All.

## 2013-04-03 NOTE — Patient Instructions (Addendum)
Please  schedule fasting Labs in 10-11 weeks after medication changes: CK,Lipids, hepatic panel. PLEASE BRING THESE INSTRUCTIONS TO FOLLOW UP  LAB APPOINTMENT.This will guarantee correct labs are drawn, eliminating need for repeat blood sampling ( needle sticks ! ). Diagnoses /Codes: 272.4,995.20.  If you activate the  My Chart system; lab & Xray results will be released directly  to you as soon as I review & address these through the computer. If you choose not to sign up for My Chart within 36 hours of labs being drawn; results will be reviewed & interpretation added before being copied & mailed, causing a delay in getting the results to you.If you do not receive that report within 7-10 days ,please call. Additionally you can use this system to gain direct  access to your records  if  out of town or @ an office of a  physician who is not in  the My Chart network.  This improves continuity of care & places you in control of your medical record.

## 2013-04-03 NOTE — Assessment & Plan Note (Addendum)
   I see no benefit in her staying on the evening of 5 right; her triglycerides have risen despite being on this medication. It only contributes to polypharmacy with increased risk of drug: Drug interaction and overall cost  The pravastatin is not getting her to goal; atorvastatin 20 mg would be recommended in its place.   Because of the diabetes and her vascular history; LDL goal is less than 70.

## 2013-04-03 NOTE — Assessment & Plan Note (Signed)
TSH is therapeutic; no change in thyroid dose

## 2013-04-03 NOTE — Progress Notes (Signed)
  Subjective:    Patient ID: [name], female    DOB: [birth year], 79 y.o.   MRN: [id number]  HPI  Her recent labs were reviewed and medication list verified. Despite taking Fenofibrate 134, pravastatin 20, and WelChol 625 her triglycerides remain extremely elevated at 386. LDL is 121. HDL is mildly reduced at 44.3.  Dr. Everardo All has her on an innovative regimen her diabetes; he is stressed that hypoglycemia is to be avoided at all cost. The most recent A1c is down slightly to 8.6%. It has been as high as 9.2%.  According to her daughter Dr. Everardo All has discussed multiple insulin doses per day. We discussed her memory issues precluding this as an option.   Review of Systems  She is on a carb restricted diet & has been seeing [name],Nutritionist.She exercises as walking several blocks over 30-40 minutes daily weather permitting. Specifically she denies chest pain, palpitations, dyspnea, or claudication with the slow walking. Family history is positive for  coronary disease in 2 brothers. Her LDL goal is less than 100, ideally <70. There is medication compliance with the statin. Significant abdominal symptoms or myalgias denied.  Dysuria today without associated hematuria or pyuria. She was recently treated for urinary tract infection with ciprofloxacin.   .      Objective:   Physical Exam Appears  well-nourished & in no acute distress  R > L carotid bruits are present.No neck vein distention present . Thyroid normal to palpation  Heart rhythm and rate are normal with  Grade 1 systolic murmur. No gallops.  Chest is clear with no increased work of breathing  There is no evidence of aortic aneurysm or renal artery bruits  Abdomen soft with no organomegaly or masses. No HJR  No clubbing or cyanosis .Lipedema present.  Pedal pulses are decreased   No ischemic skin changes are present . Nails healthy   Alert and oriented. Strength, tone normal          Assessment & Plan:   See Current Assessment & Plan in Problem List under specific Diagnosis  Acute dysuria  Plan: Clean catch urine

## 2013-04-04 ENCOUNTER — Ambulatory Visit: Payer: Medicare Other | Admitting: Internal Medicine

## 2013-04-04 ENCOUNTER — Encounter: Payer: Medicare Other | Attending: Endocrinology | Admitting: Dietician

## 2013-04-04 ENCOUNTER — Encounter: Payer: Self-pay | Admitting: Dietician

## 2013-04-04 VITALS — Ht 67.0 in | Wt 174.7 lb

## 2013-04-04 DIAGNOSIS — R350 Frequency of micturition: Secondary | ICD-10-CM | POA: Insufficient documentation

## 2013-04-04 DIAGNOSIS — Z713 Dietary counseling and surveillance: Secondary | ICD-10-CM | POA: Insufficient documentation

## 2013-04-04 DIAGNOSIS — E1169 Type 2 diabetes mellitus with other specified complication: Secondary | ICD-10-CM | POA: Insufficient documentation

## 2013-04-04 DIAGNOSIS — E1129 Type 2 diabetes mellitus with other diabetic kidney complication: Secondary | ICD-10-CM

## 2013-04-04 NOTE — Patient Instructions (Addendum)
   See Dr. Everardo All regarding the insulin.  Try to take the Levemir at the same time each day.  Recommend starting on a Saturday morning when your daughter can be there to help you with getting the basic steps down pat.  Keep the remaining 4 insulin pens in the refrigerator.  Do not freeze them.  The pen can stay out at room temperature until you finish using the pen.  If you have too little in the pen for the last dose, then just use a new pen.  I am told that insulin at room temperature is more comfortable than cold insulin that has a reputation to burn and sting.  Try to never touch the open needle to surface of the table or other objects, to prevent possible infection.  Keep walking in the house.  Do keep the juice in the house should you need it for a low blood glucose.

## 2013-04-04 NOTE — Progress Notes (Signed)
Medical Nutrition Therapy:  Appt start time: 1045 end time:  1145.  Assessment:  Primary concerns today: Comes today for general F/U and questions regarding her medications, specifically insulin and its injection.  Since last visit on 01/03/2013, her A1C has decreased from 8.9% to 8.6% on 03/28/2013.  She and her daughter report that she has been trying hard to omit as many carbs as possible from her diet.  She has also tried to increase her walking.  She has lost 7.7 lb since the February visit.   Daughter directs me to the medication list where she is to start Levemir 10 units in the AM.  Both are concerned regarding the injection process.  They are to see Dr. Everardo All regarding the insulin prescription.  We did a brief review of the action of the insulin and its role as a basal insulin and the advantage of taking it each morning.  She was able to do a supervised return demonstration using an insulin pen and injecting into the foam pad.  Following our review both she and the daughter appeared more comfortable with the insulin process.  She has decided to get some small cans of juice to keep on hand should she ever have a low blood glucose. Does report a recent UTI.    MEDICATIONS:Med review completed.  To start Levemir.  Currently on Invokeana 100 mg daily and Starlix 120 mg 3 times daily AC meals for diabetes.  BLOOD GLUCOSE MONITORING:  Plans to continue to monitor 2 times per day and is aware that she may need to monitor more in the afternoon early evening hours.  DIETARY INTAKE:  Report that she continues to eat three meals on a regular basis.  She is reading labels and trying to do some carb counting. Looking for another option at the breakfast meal.  She is tiring of the oatmeal.  She likes the idea of a whole grain toast with 2% cheese or PB as an alternative.  Daughter reports she continues to drink a great deal of water.      Recent physical activity: increased walking in the house.  Using  her cane for balance and is doing some walking outside.  Estimated energy needs: HT: 67 in  WT:174.7 lb  BMI: 27.4 Kg/m2  Adj WT:148 lb (67 kg) 1400 calories 150-155 g carbohydrates 100-105 g protein 36-38 g fat  Progress Towards Goal(s):  Some progress.   Nutritional Diagnosis:  Sasser-2.1 Inpaired nutrition utilization As related to glucose.  As evidenced by history of type 2 diabeteswith current A1C at 8.6% and starting Levemir to assist with glucose control.    Intervention:  Nutrition/diabetes  See Dr. Everardo All regarding the insulin.  Try to take the Levemir at the same time each day.  Recommend starting on a Saturday morning when your daughter can be there to help you with getting the basic steps down pat.  Keep the remaining 4 insulin pens in the refrigerator.  Do not freeze them.  The pen can stay out at room temperature until you finish using the pen.  If you have too little in the pen for the last dose, then just use a new pen.  I am told that insulin at room temperature is more comfortable than cold insulin that has a reputation to burn and sting.  Try to never touch the open needle to surface of the table or other objects, to prevent possible infection.  Keep walking in the house.  Do keep the juice  in the house should you need it for a low blood glucose.  Consider following up with Pincus Large CDE in the next 12-16 weeks for questions or assistance.  Handouts given during visit include:  2 packets of insulin pen needles   Monitoring/Evaluation:  Dietary intake, exercise, blood glucose, and body weight consider following up in 12-16 weeks with Pincus Large CDE.

## 2013-04-06 LAB — URINE CULTURE: Colony Count: 15000

## 2013-04-14 ENCOUNTER — Other Ambulatory Visit: Payer: Self-pay | Admitting: Internal Medicine

## 2013-04-14 ENCOUNTER — Other Ambulatory Visit: Payer: Self-pay | Admitting: Cardiology

## 2013-04-18 NOTE — Telephone Encounter (Signed)
..  Patient needs to contact office to schedule  Appointment  for future refills.Ph:[phone]. Thank you.  

## 2013-04-25 ENCOUNTER — Telehealth: Payer: Self-pay | Admitting: *Deleted

## 2013-05-09 ENCOUNTER — Other Ambulatory Visit: Payer: Self-pay | Admitting: *Deleted

## 2013-05-09 ENCOUNTER — Other Ambulatory Visit: Payer: Self-pay | Admitting: Cardiology

## 2013-05-10 ENCOUNTER — Encounter (INDEPENDENT_AMBULATORY_CARE_PROVIDER_SITE_OTHER): Payer: Medicare Other

## 2013-05-10 ENCOUNTER — Other Ambulatory Visit: Payer: Self-pay | Admitting: Internal Medicine

## 2013-05-10 DIAGNOSIS — R42 Dizziness and giddiness: Secondary | ICD-10-CM

## 2013-05-10 DIAGNOSIS — I6529 Occlusion and stenosis of unspecified carotid artery: Secondary | ICD-10-CM | POA: Diagnosis not present

## 2013-05-12 ENCOUNTER — Encounter: Payer: Self-pay | Admitting: Endocrinology

## 2013-05-12 ENCOUNTER — Other Ambulatory Visit: Payer: Self-pay

## 2013-05-12 ENCOUNTER — Other Ambulatory Visit: Payer: Self-pay | Admitting: *Deleted

## 2013-05-12 ENCOUNTER — Ambulatory Visit (INDEPENDENT_AMBULATORY_CARE_PROVIDER_SITE_OTHER): Payer: Medicare Other | Admitting: Endocrinology

## 2013-05-12 VITALS — BP 132/70 | HR 72 | Wt 177.0 lb

## 2013-05-12 DIAGNOSIS — I495 Sick sinus syndrome: Secondary | ICD-10-CM

## 2013-05-12 DIAGNOSIS — E1129 Type 2 diabetes mellitus with other diabetic kidney complication: Secondary | ICD-10-CM

## 2013-05-12 DIAGNOSIS — E1165 Type 2 diabetes mellitus with hyperglycemia: Secondary | ICD-10-CM | POA: Diagnosis not present

## 2013-05-12 DIAGNOSIS — Z950 Presence of cardiac pacemaker: Secondary | ICD-10-CM

## 2013-05-12 MED ORDER — ENALAPRIL MALEATE 20 MG PO TABS: 20.0000 mg | ORAL_TABLET | Freq: Every day | ORAL | Status: DC

## 2013-05-12 MED ORDER — DIGOXIN 125 MCG PO TABS: 125.0000 ug | ORAL_TABLET | ORAL | Status: DC

## 2013-05-12 NOTE — Progress Notes (Signed)
Subjective:    Patient ID: [name], female    DOB: January 07, 1925, 79 y.o.   MRN: [id number]  HPI Pt returns for f/u of type 2 DM (dx'ed 2001; she has mild if any neuropathy of the lower extremities; she has associated complications of CVA, renal insuff, and CAD).  She is not on metformin, due to chf, she is not on actos, due to edema; parlodel was stopped by her neurologist, after her cva; she is on minimal amaryl, due to renal insuff; she is taking invokana despite the cost).  Pt refuses insulin.  she brings a record of her cbg's which i have reviewed today.  All are in the 100's.  There is no trend throughout the day.   Past Medical History  Diagnosis Date  . Sliding hiatal hernia   . Erosive esophagitis   . Peptic stricture of esophagus   . Diverticulosis   . Arthritis   . Hypothyroidism   . Diabetes mellitus     Type I  . Coronary artery disease     s/p CABG 2001  . Sick sinus syndrome     s/p Medtronic DDD pacemaker implanted; generator change 02/2012  . Hypertension   . Hyperlipidemia   . GERD (gastroesophageal reflux disease)   . Adenomatous colon polyp 07/2002  . CVA (cerebral vascular accident)     Dr Debarah Crape, Neurology  . Obesity     Past Surgical History  Procedure Laterality Date  . Pacemaker placement  03/25/2000    by Dr Juanda Chance  . Tonsillectomy and adenoidectomy    . Appendectomy    . Cholecystectomy    . Total abdominal hysterectomy w/ bilateral salpingoophorectomy      Endometiosis  . Replacement total knee  2004    Right  . Colonoscopy w/ polypectomy  2003  . Cataract extraction  05/2005  . Coronary artery bypass graft  02/2000    5 vessel  . Rotator cuff repair    . Pacemaker generator replacement  03/2012    Dr. Johney Frame    History   Social History  . Marital Status: Widowed    Spouse Name: N/A    Number of Children: 1  . Years of Education: N/A   Occupational History  . Retired    Social History Main Topics  . Smoking status: Former Games developer  .  Smokeless tobacco: Former Neurosurgeon    Quit date: 11/23/1981     Comment: quit 28+ yrs ago (as of 2012)  . Alcohol Use: No  . Drug Use: No  . Sexually Active: No   Other Topics Concern  . Not on file   Social History Narrative   Lives with her grandson and his wife.   Daily caffeine use.    Current Outpatient Prescriptions on File Prior to Visit  Medication Sig Dispense Refill  . atorvastatin (LIPITOR) 20 MG tablet Take 1 tablet (20 mg total) by mouth daily. In place of Pravastatin  90 tablet  0  . Calcium Carbonate (CALCIUM 500 PO) Take 500 mg by mouth once.       . Canagliflozin (INVOKANA) 100 MG TABS Take 1 tablet by mouth daily.  90 tablet  3  . clopidogrel (PLAVIX) 75 MG tablet Take 1 tablet (75 mg total) by mouth daily.  90 tablet  2  . colesevelam (WELCHOL) 625 MG tablet Take 1,875 mg by mouth. 6 by mouth at once      . fish oil-omega-3 fatty acids 1000 MG capsule Take  1 g by mouth daily.      . insulin detemir (LEVEMIR FLEXPEN) 100 UNIT/ML injection Inject 10 Units into the skin every morning. And pen needles 1/day  15 mL  12  . levothyroxine (SYNTHROID, LEVOTHROID) 25 MCG tablet 1 1/2 by mouth daily  135 tablet  3  . memantine (NAMENDA) 10 MG tablet Take 10 mg by mouth 2 (two) times daily. Rx'ed by Dr.Yan      . metoprolol (TOPROL-XL) 200 MG 24 hr tablet TAKE 1 TABLET BY MOUTH EVERY DAY  60 tablet  0  . nateglinide (STARLIX) 120 MG tablet Take 120 mg by mouth 3 (three) times daily before meals.      . niacin 500 MG tablet Take 500 mg by mouth daily with breakfast.       . nitroGLYCERIN (NITROSTAT) 0.3 MG SL tablet Place 1 tablet (0.3 mg total) under the tongue every 5 (five) minutes as needed. For chest pain  90 tablet  3  . pantoprazole (PROTONIX) 40 MG tablet TAKE 1 TABLET (40 MG TOTAL) BY MOUTH DAILY.  90 tablet  2   No current facility-administered medications on file prior to visit.    Allergies  Allergen Reactions  . Codeine     Rash     Family History  Problem  Relation Age of Onset  . Colon cancer Mother 80  . Breast cancer Mother   . Transient ischemic attack Mother   . Cancer Mother   . Hypertension Mother   . Stroke Mother   . Diabetes Mother   . Colon cancer Brother 31  . Cancer Brother   . Hypertension Brother   . Heart attack Brother   . Breast cancer Sister   . Cancer Sister   . Diabetes Sister   . Prostate cancer Brother   . Heart attack Brother   . Heart disease      3 brothers had MI; 1 pre 24  . Hypertension Sister   . Heart attack Daughter   . Diabetes Maternal Aunt     BP 132/70  Pulse 72  Wt 177 lb (80.287 kg)  BMI 27.72 kg/m2  SpO2 95%  Review of Systems She has lost a few lbs, due to her efforts.      Objective:   Physical Exam VITAL SIGNS:  See vs page GENERAL: no distress   Lab Results  Component Value Date   HGBA1C 8.6* 03/28/2013      Assessment & Plan:  DM: she is advised to take insulin, even if it is just qd..  She refuses.  She is advises of risks of her refusal.

## 2013-05-12 NOTE — Patient Instructions (Addendum)
check your blood sugar twice a day.  vary the time of day when you check, between before the 3 meals, and at bedtime.  also check if you have symptoms of your blood sugar being too high or too low.  please keep a record of the readings and bring it to your next appointment here.  please call us sooner if your blood sugar goes below 70, or if you have a lot of readings over 200.   Please come back for a follow-up appointment in 3 months.   

## 2013-05-23 NOTE — Telephone Encounter (Signed)
called G.simmons left msg. for [name] that she needed an appt. w/doc b/c it has been over 1 yr since her last doc appt. That is why she only recieved 30 day supply.

## 2013-05-24 ENCOUNTER — Ambulatory Visit (INDEPENDENT_AMBULATORY_CARE_PROVIDER_SITE_OTHER): Payer: Medicare Other | Admitting: Internal Medicine

## 2013-05-24 ENCOUNTER — Other Ambulatory Visit: Payer: Self-pay | Admitting: Internal Medicine

## 2013-05-24 ENCOUNTER — Encounter: Payer: Self-pay | Admitting: Internal Medicine

## 2013-05-24 VITALS — BP 158/78 | HR 67 | Ht 66.0 in | Wt 179.6 lb

## 2013-05-24 DIAGNOSIS — I5022 Chronic systolic (congestive) heart failure: Secondary | ICD-10-CM

## 2013-05-24 DIAGNOSIS — Z950 Presence of cardiac pacemaker: Secondary | ICD-10-CM

## 2013-05-24 DIAGNOSIS — I25810 Atherosclerosis of coronary artery bypass graft(s) without angina pectoris: Secondary | ICD-10-CM | POA: Diagnosis not present

## 2013-05-24 DIAGNOSIS — I2510 Atherosclerotic heart disease of native coronary artery without angina pectoris: Secondary | ICD-10-CM

## 2013-05-24 DIAGNOSIS — I495 Sick sinus syndrome: Secondary | ICD-10-CM

## 2013-05-24 DIAGNOSIS — I10 Essential (primary) hypertension: Secondary | ICD-10-CM

## 2013-05-24 NOTE — Progress Notes (Addendum)
PCP: Marga Melnick, MD   [name] is a 79 y.o. female who presents today for routine electrophysiology followup.  Since last being seen in our clinic, the patient reports doing very well.  She has been having some problems with pain that radiates through her shoulders.  It is not related to exertion or associated with other symptoms.  The patient feels like it is related to arthritis.     Today, she denies symptoms of palpitations, exertional chest pain, shortness of breath,  lower extremity edema, dizziness, presyncope, or syncope.  The patient is otherwise without complaint today.   Past Medical History  Diagnosis Date  . Sliding hiatal hernia   . Erosive esophagitis   . Peptic stricture of esophagus   . Diverticulosis   . Arthritis   . Hypothyroidism   . Diabetes mellitus     Type I  . Coronary artery disease     s/p CABG 2001  . Sick sinus syndrome     s/p Medtronic DDD pacemaker implanted; generator change 02/2012  . Hypertension   . Hyperlipidemia   . GERD (gastroesophageal reflux disease)   . Adenomatous colon polyp 07/2002  . CVA (cerebral vascular accident)     Dr Debarah Crape, Neurology  . Obesity    Past Surgical History  Procedure Laterality Date  . Pacemaker placement  03/25/2000    by Dr Juanda Chance  . Tonsillectomy and adenoidectomy    . Appendectomy    . Cholecystectomy    . Total abdominal hysterectomy w/ bilateral salpingoophorectomy      Endometiosis  . Replacement total knee  2004    Right  . Colonoscopy w/ polypectomy  2003  . Cataract extraction  05/2005  . Coronary artery bypass graft  02/2000    5 vessel  . Rotator cuff repair    . Pacemaker generator replacement  03/2012    Dr. Johney Frame    Current Outpatient Prescriptions  Medication Sig Dispense Refill  . atorvastatin (LIPITOR) 20 MG tablet Take 1 tablet (20 mg total) by mouth daily. In place of Pravastatin  90 tablet  0  . Calcium Carbonate (CALCIUM 500 PO) Take 500 mg by mouth once.       .  Canagliflozin (INVOKANA) 100 MG TABS Take 1 tablet by mouth daily.  90 tablet  3  . clopidogrel (PLAVIX) 75 MG tablet Take 1 tablet (75 mg total) by mouth daily.  90 tablet  2  . colesevelam (WELCHOL) 625 MG tablet Take 1,875 mg by mouth. 6 by mouth at once      . digoxin (LANOXIN) 0.125 MG tablet Take 1 tablet (125 mcg total) by mouth See admin instructions. One daily except 1 1/2 tab every Wed  96 tablet  3  . enalapril (VASOTEC) 20 MG tablet Take 1 tablet (20 mg total) by mouth daily.  30 tablet  1  . fish oil-omega-3 fatty acids 1000 MG capsule Take 1 g by mouth daily.      Marland Kitchen levothyroxine (SYNTHROID, LEVOTHROID) 25 MCG tablet 1 1/2 by mouth daily  135 tablet  3  . memantine (NAMENDA) 10 MG tablet Take 10 mg by mouth 2 (two) times daily. Rx'ed by Dr.Yan      . metoprolol (TOPROL-XL) 200 MG 24 hr tablet TAKE 1 TABLET BY MOUTH EVERY DAY  60 tablet  0  . nateglinide (STARLIX) 120 MG tablet Take 120 mg by mouth 3 (three) times daily before meals.      . niacin 500  MG tablet Take 500 mg by mouth daily with breakfast.       . nitroGLYCERIN (NITROSTAT) 0.3 MG SL tablet Place 1 tablet (0.3 mg total) under the tongue every 5 (five) minutes as needed. For chest pain  90 tablet  3  . pantoprazole (PROTONIX) 40 MG tablet TAKE 1 TABLET (40 MG TOTAL) BY MOUTH DAILY.  90 tablet  2   No current facility-administered medications for this visit.    Physical Exam: Filed Vitals:   05/24/13 1147  BP: 158/78  Pulse: 67  Height: 5\' 6"  (1.676 m)  Weight: 179 lb 9.6 oz (81.466 kg)    GEN- The patient is well appearing, alert and oriented x 3 today.   Head- normocephalic, atraumatic Eyes-  Sclera clear, conjunctiva pink Ears- hearing intact Oropharynx- clear Lungs- Clear to ausculation bilaterally, normal work of breathing Chest- pacemaker pocket is well healed Heart- Regular rate and rhythm, no murmurs, rubs or gallops, PMI not laterally displaced GI- soft, NT, ND, + BS Extremities- no clubbing,  cyanosis, or edema  Pacemaker interrogation- reviewed in detail today,  See PACEART report  Assessment and Plan:  1. Sick sinus syndrome Normal pacemaker function See Pace Art report No changes today  2. CAD No ischemic symptoms No changes  3. HTN Stable No change required today  Stop digoxin today  Return to see the device clinic PA in 1 year   Hillis Range, MD

## 2013-05-24 NOTE — Patient Instructions (Addendum)
Remote monitoring is used to monitor your Pacemaker of ICD from home. This monitoring reduces the number of office visits required to check your device to one time per year. It allows [place] to keep an eye on the functioning of your device to ensure it is working properly. You are scheduled for a device check from home on August 28, 2013. You may send your transmission at any time that day. If you have a wireless device, the transmission will be sent automatically. After your physician reviews your transmission, you will receive a postcard with your next transmission date.  Your physician wants you to follow-up in: 1 year with Rick Duff, PA.  You will receive a reminder letter in the mail two months in advance. If you don't receive a letter, please call our office to schedule the follow-up appointment.  Your physician has recommended you make the following change in your medication: Stop Digoxin

## 2013-06-02 ENCOUNTER — Other Ambulatory Visit: Payer: Self-pay | Admitting: Cardiology

## 2013-06-03 LAB — PACEMAKER DEVICE OBSERVATION
AL IMPEDENCE PM: 443 Ohm
AL THRESHOLD: 0.625 V
BATTERY VOLTAGE: 2.79 V
RV LEAD AMPLITUDE: 11.2 mv

## 2013-06-08 ENCOUNTER — Other Ambulatory Visit: Payer: Self-pay | Admitting: Neurology

## 2013-06-09 ENCOUNTER — Encounter: Payer: Self-pay | Admitting: Internal Medicine

## 2013-06-28 ENCOUNTER — Other Ambulatory Visit: Payer: Self-pay

## 2013-07-03 DIAGNOSIS — Z961 Presence of intraocular lens: Secondary | ICD-10-CM | POA: Diagnosis not present

## 2013-07-03 DIAGNOSIS — E119 Type 2 diabetes mellitus without complications: Secondary | ICD-10-CM | POA: Diagnosis not present

## 2013-07-15 ENCOUNTER — Other Ambulatory Visit: Payer: Self-pay | Admitting: Internal Medicine

## 2013-07-18 ENCOUNTER — Encounter: Payer: Self-pay | Admitting: Internal Medicine

## 2013-08-01 ENCOUNTER — Encounter: Payer: Self-pay | Admitting: Endocrinology

## 2013-08-01 ENCOUNTER — Ambulatory Visit (INDEPENDENT_AMBULATORY_CARE_PROVIDER_SITE_OTHER): Payer: Medicare Other | Admitting: Endocrinology

## 2013-08-01 VITALS — BP 132/72 | HR 78 | Ht 65.0 in | Wt 184.0 lb

## 2013-08-01 DIAGNOSIS — E1129 Type 2 diabetes mellitus with other diabetic kidney complication: Secondary | ICD-10-CM | POA: Diagnosis not present

## 2013-08-01 MED ORDER — BROMOCRIPTINE MESYLATE 2.5 MG PO TABS: 2.5000 mg | ORAL_TABLET | Freq: Every day | ORAL | Status: DC

## 2013-08-01 NOTE — Progress Notes (Signed)
Subjective:    Patient ID: [name], female    DOB: 1924-12-16, 79 y.o.   MRN: [id number]  HPI Pt returns for f/u of type 2 DM (dx'ed 2001; she has mild if any neuropathy of the lower extremities; she has associated complications of CVA, renal insuff, and CAD; she is not on metformin, due to chf, she is not on actos, due to edema; parlodel was stopped by her neurologist, after her cva; she is on minimal amaryl, due to renal insuff; she is taking invokana despite the cost).  Pt refuses insulin.  she brings a record of her cbg's which i have reviewed today.  It varies from 120-210.  There is no trend throughout the day.  Past Medical History  Diagnosis Date  . Sliding hiatal hernia   . Erosive esophagitis   . Peptic stricture of esophagus   . Diverticulosis   . Arthritis   . Hypothyroidism   . Diabetes mellitus     Type I  . Coronary artery disease     s/p CABG 2001  . Sick sinus syndrome     s/p Medtronic DDD pacemaker implanted; generator change 02/2012  . Hypertension   . Hyperlipidemia   . GERD (gastroesophageal reflux disease)   . Adenomatous colon polyp 07/2002  . CVA (cerebral vascular accident)     Dr Debarah Crape, Neurology  . Obesity     Past Surgical History  Procedure Laterality Date  . Pacemaker placement  03/25/2000    by Dr Juanda Chance  . Tonsillectomy and adenoidectomy    . Appendectomy    . Cholecystectomy    . Total abdominal hysterectomy w/ bilateral salpingoophorectomy      Endometiosis  . Replacement total knee  2004    Right  . Colonoscopy w/ polypectomy  2003  . Cataract extraction  05/2005  . Coronary artery bypass graft  02/2000    5 vessel  . Rotator cuff repair    . Pacemaker generator replacement  03/2012    Dr. Johney Frame    History   Social History  . Marital Status: Widowed    Spouse Name: N/A    Number of Children: 1  . Years of Education: N/A   Occupational History  . Retired    Social History Main Topics  . Smoking status: Former Games developer  .  Smokeless tobacco: Former Neurosurgeon    Quit date: 11/23/1981     Comment: quit 28+ yrs ago (as of 2012)  . Alcohol Use: No  . Drug Use: No  . Sexual Activity: No   Other Topics Concern  . Not on file   Social History Narrative   Lives with her grandson and his wife.   Daily caffeine use.    Current Outpatient Prescriptions on File Prior to Visit  Medication Sig Dispense Refill  . atorvastatin (LIPITOR) 20 MG tablet Take 1 tablet (20 mg total) by mouth daily. In place of Pravastatin  90 tablet  0  . Calcium Carbonate (CALCIUM 500 PO) Take 500 mg by mouth once.       . Canagliflozin (INVOKANA) 100 MG TABS Take 1 tablet by mouth daily.  90 tablet  3  . clopidogrel (PLAVIX) 75 MG tablet TAKE 1 TABLET EVERY DAY  90 tablet  2  . colesevelam (WELCHOL) 625 MG tablet Take 1,875 mg by mouth. 6 by mouth at once      . enalapril (VASOTEC) 20 MG tablet Take 1 tablet (20 mg total) by mouth daily.  30 tablet  1  . fish oil-omega-3 fatty acids 1000 MG capsule Take 1 g by mouth daily.      Marland Kitchen levothyroxine (SYNTHROID, LEVOTHROID) 25 MCG tablet 1 1/2 by mouth daily  135 tablet  3  . metoprolol (TOPROL-XL) 200 MG 24 hr tablet TAKE 1 TABLET EVERY DAY  60 tablet  0  . NAMENDA 10 MG tablet TAKE 1 TABLET TWICE A DAY  180 tablet  0  . nateglinide (STARLIX) 120 MG tablet Take 120 mg by mouth 3 (three) times daily before meals.      . niacin 500 MG tablet Take 500 mg by mouth daily with breakfast.       . nitroGLYCERIN (NITROSTAT) 0.3 MG SL tablet Place 1 tablet (0.3 mg total) under the tongue every 5 (five) minutes as needed. For chest pain  90 tablet  3  . pantoprazole (PROTONIX) 40 MG tablet TAKE 1 TABLET (40 MG TOTAL) BY MOUTH DAILY.  90 tablet  2   No current facility-administered medications on file prior to visit.    Allergies  Allergen Reactions  . Codeine     Rash     Family History  Problem Relation Age of Onset  . Colon cancer Mother 53  . Breast cancer Mother   . Transient ischemic attack  Mother   . Cancer Mother   . Hypertension Mother   . Stroke Mother   . Diabetes Mother   . Colon cancer Brother 63  . Cancer Brother   . Hypertension Brother   . Heart attack Brother   . Breast cancer Sister   . Cancer Sister   . Diabetes Sister   . Prostate cancer Brother   . Heart attack Brother   . Heart disease      3 brothers had MI; 1 pre 26  . Hypertension Sister   . Heart attack Daughter   . Diabetes Maternal Aunt     BP 132/72  Pulse 78  Ht 5\' 5"  (1.651 m)  Wt 184 lb (83.462 kg)  BMI 30.62 kg/m2  SpO2 98%  Review of Systems denies hypoglycemia and weight change    Objective:   Physical Exam VITAL SIGNS:  See vs page GENERAL: no distress  Lab Results  Component Value Date   HGBA1C 8.1* 08/01/2013      Assessment & Plan:  DM: We discussed the nine oral agents available for type 2 diabetes.  This oral regimen gives the best available risk-benefit ratio.  She needs increased rx. Noncompliance: this causes high risk to her health.  she is advised to take insulin, even if it is just qd..  She refuses.  She is advises of risks of her refusal.  Renal insufficiency: this limits oral antidiabetic options.

## 2013-08-01 NOTE — Patient Instructions (Addendum)
check your blood sugar twice a day.  vary the time of day when you check, between before the 3 meals, and at bedtime.  also check if you have symptoms of your blood sugar being too high or too low.  please keep a record of the readings and bring it to your next appointment here.  please call us sooner if your blood sugar goes below 70, or if you have a lot of readings over 200.   Please come back for a follow-up appointment in 3 months.   Please add "bromocriptine," to help your blood sugar. It has possible side effects of nausea and dizziness.  These go away with time.  You can avoid these by taking it at bedtime, and by taking just take 1/2 pill for the first week.

## 2013-08-05 ENCOUNTER — Other Ambulatory Visit: Payer: Self-pay | Admitting: Cardiology

## 2013-08-07 ENCOUNTER — Telehealth: Payer: Self-pay | Admitting: Endocrinology

## 2013-08-07 NOTE — Telephone Encounter (Signed)
Pt's dtr states that Dr.Hopper took pt off the Bromicriptine, would like to know if it is necessary to take this med again?

## 2013-08-07 NOTE — Telephone Encounter (Signed)
Pt's dtr advised 

## 2013-08-07 NOTE — Telephone Encounter (Signed)
Yes, please resume

## 2013-08-14 ENCOUNTER — Other Ambulatory Visit: Payer: Self-pay | Admitting: Internal Medicine

## 2013-08-16 NOTE — Telephone Encounter (Signed)
Med filled.  

## 2013-08-23 ENCOUNTER — Telehealth: Payer: Self-pay | Admitting: Endocrinology

## 2013-08-23 ENCOUNTER — Other Ambulatory Visit: Payer: Self-pay | Admitting: Endocrinology

## 2013-08-23 NOTE — Telephone Encounter (Signed)
Ok for both

## 2013-08-23 NOTE — Telephone Encounter (Signed)
It is ok with me to refill, but i also don't have the pharmacy info. No mychart messages are in epic.

## 2013-08-23 NOTE — Telephone Encounter (Signed)
I dont have record of the email that was sent to you

## 2013-08-23 NOTE — Telephone Encounter (Signed)
Pt's dtr states she sent you an email with the pts info for [facility]. And rx's were sent in, in orrectly they need to be sent for a 90-day supply for Bromcriptine and lancets and pens

## 2013-08-24 ENCOUNTER — Telehealth: Payer: Self-pay | Admitting: Endocrinology

## 2013-08-25 ENCOUNTER — Other Ambulatory Visit: Payer: Self-pay

## 2013-08-25 NOTE — Telephone Encounter (Signed)
Form on your desk to be completed.

## 2013-08-27 NOTE — Telephone Encounter (Signed)
done

## 2013-08-28 ENCOUNTER — Ambulatory Visit (INDEPENDENT_AMBULATORY_CARE_PROVIDER_SITE_OTHER): Payer: Medicare Other | Admitting: *Deleted

## 2013-08-28 DIAGNOSIS — I495 Sick sinus syndrome: Secondary | ICD-10-CM | POA: Diagnosis not present

## 2013-08-28 DIAGNOSIS — Z950 Presence of cardiac pacemaker: Secondary | ICD-10-CM

## 2013-08-28 NOTE — Telephone Encounter (Signed)
faxed

## 2013-09-01 LAB — REMOTE PACEMAKER DEVICE
ATRIAL PACING PM: 87
BAMS-0001: 150 {beats}/min
RV LEAD IMPEDENCE PM: 731 Ohm
RV LEAD THRESHOLD: 1.125 V
VENTRICULAR PACING PM: 1

## 2013-09-04 ENCOUNTER — Other Ambulatory Visit: Payer: Self-pay | Admitting: *Deleted

## 2013-09-04 DIAGNOSIS — Z23 Encounter for immunization: Secondary | ICD-10-CM | POA: Diagnosis not present

## 2013-09-04 MED ORDER — CANAGLIFLOZIN 100 MG PO TABS: 1.0000 | ORAL_TABLET | Freq: Every day | ORAL | Status: DC

## 2013-09-04 MED ORDER — NATEGLINIDE 120 MG PO TABS: 120.0000 mg | ORAL_TABLET | Freq: Three times a day (TID) | ORAL | Status: DC

## 2013-09-05 ENCOUNTER — Other Ambulatory Visit: Payer: Self-pay

## 2013-09-18 ENCOUNTER — Encounter: Payer: Self-pay | Admitting: Internal Medicine

## 2013-09-21 ENCOUNTER — Encounter: Payer: Self-pay | Admitting: Internal Medicine

## 2013-09-28 ENCOUNTER — Other Ambulatory Visit: Payer: Self-pay

## 2013-10-03 ENCOUNTER — Other Ambulatory Visit: Payer: Self-pay | Admitting: Cardiology

## 2013-10-10 DIAGNOSIS — M1710 Unilateral primary osteoarthritis, unspecified knee: Secondary | ICD-10-CM | POA: Diagnosis not present

## 2013-10-10 DIAGNOSIS — M67919 Unspecified disorder of synovium and tendon, unspecified shoulder: Secondary | ICD-10-CM | POA: Diagnosis not present

## 2013-10-10 DIAGNOSIS — M19049 Primary osteoarthritis, unspecified hand: Secondary | ICD-10-CM | POA: Diagnosis not present

## 2013-10-14 ENCOUNTER — Other Ambulatory Visit: Payer: Self-pay | Admitting: Internal Medicine

## 2013-10-14 ENCOUNTER — Other Ambulatory Visit: Payer: Self-pay | Admitting: Neurology

## 2013-10-15 NOTE — Telephone Encounter (Signed)
Pantoprazole refilled per protocol

## 2013-10-16 ENCOUNTER — Other Ambulatory Visit: Payer: Self-pay | Admitting: *Deleted

## 2013-10-16 MED ORDER — COLESEVELAM HCL 625 MG PO TABS: 1875.0000 mg | ORAL_TABLET | Freq: Two times a day (BID) | ORAL | Status: DC

## 2013-10-31 ENCOUNTER — Encounter: Payer: Self-pay | Admitting: Endocrinology

## 2013-10-31 ENCOUNTER — Ambulatory Visit (INDEPENDENT_AMBULATORY_CARE_PROVIDER_SITE_OTHER): Payer: Medicare Other | Admitting: Endocrinology

## 2013-10-31 VITALS — BP 124/90 | HR 110 | Temp 97.7°F | Ht 63.0 in | Wt 193.0 lb

## 2013-10-31 DIAGNOSIS — E1129 Type 2 diabetes mellitus with other diabetic kidney complication: Secondary | ICD-10-CM

## 2013-10-31 LAB — BASIC METABOLIC PANEL
CO2: 24 mEq/L (ref 19–32)
Chloride: 101 mEq/L (ref 96–112)
Creatinine, Ser: 1.2 mg/dL (ref 0.4–1.2)
Glucose, Bld: 189 mg/dL — ABNORMAL HIGH (ref 70–99)
Sodium: 133 mEq/L — ABNORMAL LOW (ref 135–145)

## 2013-10-31 LAB — HEMOGLOBIN A1C: Hgb A1c MFr Bld: 7.8 % — ABNORMAL HIGH (ref 4.6–6.5)

## 2013-10-31 MED ORDER — ACARBOSE 25 MG PO TABS: 25.0000 mg | ORAL_TABLET | Freq: Three times a day (TID) | ORAL | Status: DC

## 2013-10-31 NOTE — Progress Notes (Signed)
Subjective:    Patient ID: [name], female    DOB: Jan 12, 1925, 79 y.o.   MRN: [id number]  HPI Pt returns for f/u of type 2 DM (dx'ed 2001; she has mild if any neuropathy of the lower extremities; she has associated complications of CVA, renal insuff, and CAD; she is not on metformin, due to chf, she is not on actos, due to edema; she is off amaryl, due to renal insuff; she is taking invokana despite the cost).  Pt refuses insulin.  she brings a record of her cbg's which i have reviewed today.  All are in the mid-100's.  There is no trend throughout the day.   Past Medical History  Diagnosis Date  . Sliding hiatal hernia   . Erosive esophagitis   . Peptic stricture of esophagus   . Diverticulosis   . Arthritis   . Hypothyroidism   . Diabetes mellitus     Type I  . Coronary artery disease     s/p CABG 2001  . Sick sinus syndrome     s/p Medtronic DDD pacemaker implanted; generator change 02/2012  . Hypertension   . Hyperlipidemia   . GERD (gastroesophageal reflux disease)   . Adenomatous colon polyp 07/2002  . CVA (cerebral vascular accident)     Dr Debarah Crape, Neurology  . Obesity     Past Surgical History  Procedure Laterality Date  . Pacemaker placement  03/25/2000    by Dr Juanda Chance  . Tonsillectomy and adenoidectomy    . Appendectomy    . Cholecystectomy    . Total abdominal hysterectomy w/ bilateral salpingoophorectomy      Endometiosis  . Replacement total knee  2004    Right  . Colonoscopy w/ polypectomy  2003  . Cataract extraction  05/2005  . Coronary artery bypass graft  02/2000    5 vessel  . Rotator cuff repair    . Pacemaker generator replacement  03/2012    Dr. Johney Frame    History   Social History  . Marital Status: Widowed    Spouse Name: N/A    Number of Children: 1  . Years of Education: N/A   Occupational History  . Retired    Social History Main Topics  . Smoking status: Former Games developer  . Smokeless tobacco: Former Neurosurgeon    Quit date: 11/23/1981     Comment: quit 28+ yrs ago (as of 2012)  . Alcohol Use: No  . Drug Use: No  . Sexual Activity: No   Other Topics Concern  . Not on file   Social History Narrative   Lives with her grandson and his wife.   Daily caffeine use.    Current Outpatient Prescriptions on File Prior to Visit  Medication Sig Dispense Refill  . atorvastatin (LIPITOR) 20 MG tablet TAKE 1 TABLET BY MOUTH EVERY DAY (IN PLACE OF PRAVASTATIN)  90 tablet  0  . bromocriptine (PARLODEL) 2.5 MG tablet Take 1 tablet (2.5 mg total) by mouth at bedtime.  30 tablet  11  . Calcium Carbonate (CALCIUM 500 PO) Take 500 mg by mouth once.       . Canagliflozin (INVOKANA) 100 MG TABS Take 1 tablet (100 mg total) by mouth daily.  90 tablet  2  . clopidogrel (PLAVIX) 75 MG tablet TAKE 1 TABLET EVERY DAY  90 tablet  2  . colesevelam (WELCHOL) 625 MG tablet Take 3 tablets (1,875 mg total) by mouth 2 (two) times daily before a meal.  540 tablet  3  . enalapril (VASOTEC) 20 MG tablet Take 1 tablet (20 mg total) by mouth daily.  30 tablet  1  . fish oil-omega-3 fatty acids 1000 MG capsule Take 1 g by mouth daily.      Marland Kitchen levothyroxine (SYNTHROID, LEVOTHROID) 25 MCG tablet 1 1/2 by mouth daily  135 tablet  3  . metoprolol (TOPROL-XL) 200 MG 24 hr tablet TAKE 1 TABLET EVERY DAY  30 tablet  0  . NAMENDA 10 MG tablet TAKE 1 TABLET TWICE A DAY  60 tablet  0  . nateglinide (STARLIX) 120 MG tablet Take 1 tablet (120 mg total) by mouth 3 (three) times daily before meals.  90 tablet  2  . niacin 500 MG tablet Take 500 mg by mouth daily with breakfast.       . nitroGLYCERIN (NITROSTAT) 0.3 MG SL tablet Place 1 tablet (0.3 mg total) under the tongue every 5 (five) minutes as needed. For chest pain  90 tablet  3  . pantoprazole (PROTONIX) 40 MG tablet TAKE 1 TABLET (40 MG TOTAL) BY MOUTH DAILY.  90 tablet  1   No current facility-administered medications on file prior to visit.    Allergies  Allergen Reactions  . Codeine     Rash     Family  History  Problem Relation Age of Onset  . Colon cancer Mother 68  . Breast cancer Mother   . Transient ischemic attack Mother   . Cancer Mother   . Hypertension Mother   . Stroke Mother   . Diabetes Mother   . Colon cancer Brother 54  . Cancer Brother   . Hypertension Brother   . Heart attack Brother   . Breast cancer Sister   . Cancer Sister   . Diabetes Sister   . Prostate cancer Brother   . Heart attack Brother   . Heart disease      3 brothers had MI; 1 pre 43  . Hypertension Sister   . Heart attack Daughter   . Diabetes Maternal Aunt     BP 124/90  Pulse 110  Temp(Src) 97.7 F (36.5 C) (Oral)  Ht 5\' 3"  (1.6 m)  Wt 193 lb (87.544 kg)  BMI 34.20 kg/m2  SpO2 90%  Review of Systems denies hypoglycemia.  She has gained weight    Objective:   Physical Exam VITAL SIGNS:  See vs page GENERAL: no distress  Lab Results  Component Value Date   HGBA1C 7.8* 10/31/2013      Assessment & Plan:  DM: We discussed the nine oral agents available for type 2 diabetes.  This oral regimen gives the best available risk-benefit ratio.  Needs increased rx, if it can be done with a regimen that avoids or minimizes hypoglycemia. Renal insufficiency: this limits oral agent options. Weight gain: this complicates the rx of DM.

## 2013-10-31 NOTE — Patient Instructions (Addendum)
check your blood sugar twice a day.  vary the time of day when you check, between before the 3 meals, and at bedtime.  also check if you have symptoms of your blood sugar being too high or too low.  please keep a record of the readings and bring it to your next appointment here.  please call us sooner if your blood sugar goes below 70, or if you have a lot of readings over 200.   Please come back for a follow-up appointment in 3 months.   blood tests are being requested for you today.  We'll contact you with results. Based on the results, we may need to add "acarbose."  i'll let you know.

## 2013-11-10 ENCOUNTER — Other Ambulatory Visit: Payer: Self-pay | Admitting: Cardiology

## 2013-11-10 ENCOUNTER — Other Ambulatory Visit: Payer: Self-pay | Admitting: Neurology

## 2013-11-13 ENCOUNTER — Telehealth: Payer: Self-pay | Admitting: Neurology

## 2013-11-13 NOTE — Telephone Encounter (Signed)
Patient has not been seen since 05/2012.  We have noted on past 2 refills an appt is needed, one has yet to be made.  I called the patient, got no answer.  Left message asking that they call the office to schedule appt.  Advised refills would be sent to last until she is seen once OV has been made.

## 2013-11-13 NOTE — Telephone Encounter (Signed)
Rx has been sent.  Called patient back, got no answer.  Left message  

## 2013-11-30 ENCOUNTER — Other Ambulatory Visit: Payer: Self-pay

## 2013-11-30 MED ORDER — NATEGLINIDE 120 MG PO TABS: 120.0000 mg | ORAL_TABLET | Freq: Three times a day (TID) | ORAL | Status: DC

## 2013-12-01 ENCOUNTER — Telehealth: Payer: Self-pay | Admitting: *Deleted

## 2013-12-01 ENCOUNTER — Encounter: Payer: Medicare Other | Admitting: *Deleted

## 2013-12-01 DIAGNOSIS — Z950 Presence of cardiac pacemaker: Secondary | ICD-10-CM

## 2013-12-01 DIAGNOSIS — I495 Sick sinus syndrome: Secondary | ICD-10-CM

## 2013-12-01 DIAGNOSIS — E785 Hyperlipidemia, unspecified: Secondary | ICD-10-CM

## 2013-12-01 LAB — MDC_IDC_ENUM_SESS_TYPE_REMOTE
Lead Channel Pacing Threshold Amplitude: 0.5 V
Lead Channel Pacing Threshold Pulse Width: 0.4 ms
Lead Channel Setting Pacing Amplitude: 2 V
Lead Channel Setting Pacing Pulse Width: 0.4 ms
Lead Channel Setting Sensing Sensitivity: 5.6 mV
MDC IDC MSMT LEADCHNL RA PACING THRESHOLD PULSEWIDTH: 0.4 ms
MDC IDC MSMT LEADCHNL RV PACING THRESHOLD AMPLITUDE: 1.25 V
MDC IDC MSMT LEADCHNL RV SENSING INTR AMPL: 22.4 mV
MDC IDC SET LEADCHNL RV PACING AMPLITUDE: 2.5 V

## 2013-12-01 NOTE — Telephone Encounter (Signed)
Patient called and requested a refill for atorvastatin (LIPITOR) 20 MG tablet and also they stated his pharmacy have sent numerous of requests over.   Pharmacy [id number] [address], [place]

## 2013-12-05 DIAGNOSIS — Z1231 Encounter for screening mammogram for malignant neoplasm of breast: Secondary | ICD-10-CM | POA: Diagnosis not present

## 2013-12-06 MED ORDER — ATORVASTATIN CALCIUM 20 MG PO TABS: ORAL_TABLET | ORAL | Status: DC

## 2013-12-06 NOTE — Telephone Encounter (Signed)
Refill for [facility]itor sent to [facility] [address]

## 2013-12-18 ENCOUNTER — Encounter: Payer: Self-pay | Admitting: *Deleted

## 2013-12-20 ENCOUNTER — Encounter: Payer: Self-pay | Admitting: Internal Medicine

## 2014-01-16 ENCOUNTER — Ambulatory Visit: Payer: Medicare Other | Admitting: Internal Medicine

## 2014-01-19 ENCOUNTER — Ambulatory Visit: Payer: Self-pay | Admitting: Neurology

## 2014-01-19 ENCOUNTER — Ambulatory Visit: Payer: Medicare Other | Admitting: Internal Medicine

## 2014-01-24 ENCOUNTER — Ambulatory Visit (INDEPENDENT_AMBULATORY_CARE_PROVIDER_SITE_OTHER): Payer: Medicare Other | Admitting: Internal Medicine

## 2014-01-24 ENCOUNTER — Encounter: Payer: Self-pay | Admitting: Internal Medicine

## 2014-01-24 VITALS — BP 122/68 | HR 67 | Temp 97.1°F | Resp 14

## 2014-01-24 DIAGNOSIS — R32 Unspecified urinary incontinence: Secondary | ICD-10-CM

## 2014-01-24 DIAGNOSIS — E1149 Type 2 diabetes mellitus with other diabetic neurological complication: Secondary | ICD-10-CM | POA: Diagnosis not present

## 2014-01-24 DIAGNOSIS — L84 Corns and callosities: Secondary | ICD-10-CM | POA: Diagnosis not present

## 2014-01-24 DIAGNOSIS — M21969 Unspecified acquired deformity of unspecified lower leg: Secondary | ICD-10-CM | POA: Diagnosis not present

## 2014-01-24 DIAGNOSIS — G909 Disorder of the autonomic nervous system, unspecified: Secondary | ICD-10-CM

## 2014-01-24 DIAGNOSIS — E1143 Type 2 diabetes mellitus with diabetic autonomic (poly)neuropathy: Secondary | ICD-10-CM

## 2014-01-24 MED ORDER — MIRABEGRON ER 25 MG PO TB24: 25.0000 mg | ORAL_TABLET | Freq: Every day | ORAL | Status: DC

## 2014-01-24 NOTE — Progress Notes (Signed)
Pre visit review using our clinic review tool, if applicable. No additional management support is needed unless otherwise documented below in the visit note. 

## 2014-01-24 NOTE — Patient Instructions (Addendum)
I recommend Podiatry  & Urology consultations to determine optimal therapy; please inform me if you have a physician preference. The  Referrals will be scheduled and you'll be notified of the time. Myrbetriq 25 mg daily until seen by Urology (#14 samples)

## 2014-01-24 NOTE — Progress Notes (Signed)
   Subjective:    Patient ID: [name], female    DOB: [birth year], 80 y.o.   MRN: [id number]  HPI Patient is T2DM, treated with Invokana, Acarbose, and Parlodel. Follow up today is concerning foot changes; reports redness, some burning sensation, and toe thickening.    Review of Systems FASTING HYPERGLYCEMIA OR Diabetes : 160s FBS range/average: 114-215 Highest 2 hr post meal glucose:  Medication compliance: compliant Hypoglycemia: no Ophthamology care: Fall 2014, annually Podiatry care: needs referral  Exercise of walking has been limited by weather.   Constitutional: No fever, chills, significant weight change, fatigue, weakness or night sweats  Eyes: No blurred vision, double vision, or loss of vision since CVA  Cardiovascular: no chest pain, palpitations, racing (with exertion), irregular rhythm,syncope,nausea,sweating,or Claudication. Respiratory: No exertinal dyspnea, paroxysmal nocturnal dyspnea  Gastrointestinal: No heartburn,dysphagia, diarrhea, rectal bleeding, melena.   Musculoskeletal: No myalgias or muscle cramping , weakness, or cyanosis  Dermatologic: No rash, pruritus, urticaria, or change in color or temperature of skin  Neurologic: Some chronic feet numbness or tingling  Endocrine: No change in hair/skin/ nails, excessive thirst, excessive hunger, excessive urination, or unexplained fatigue      Objective:   Physical Exam Gen.:  well-nourished; in no acute distress Eyes: Extraocular motion intact; no lid lag or proptosis ,nystagmus Mouth:  Neck: full ROM; no masses ; thyroid normal.  Heart: Normal rhythm and rate without significant murmur, gallop, or extra heart sounds Lungs: Chest clear to auscultation without rales,rales, wheezes Neuro:Deep tendon reflexes are equal and within normal limits; no tremor  Skin: Warm and dry without significant lesions or rashes; no onycholysis Lymphatic: no cervical or axillary LA Psych: Normally communicative and  interactive; no abnormal mood or affect clinically.      Assessment & Plan:

## 2014-01-24 NOTE — Progress Notes (Signed)
   Subjective:    Patient ID: [name], female    DOB: May 24, 1925, 80 y.o.   MRN: [id number]  HPI Patient is T2DM, treated with Invokana, Acarbose, and Parlodel. Follow up today is concerning foot changes;she reports redness, some burning sensation, and toe thickening.  She's not been seen by podiatrist.  She is followed by Dr. Loanne Drilling, endocrinologist. In December her A1c was 7.8% indicating adequate diabetic control  Review of Systems  She has had urinary incontinence for approximately a year. This is worse in the morning associated with urgency.  She also wears a pad during the day.  She denies polyuria, polyphagia, polydipsia.  She denies dysuria, hematuria, or pyuria,.       Objective:   Physical Exam  Gen.: Adequately nourished in appearance but weight excess. Alert and cooperative throughout exam.   Head: Normocephalic without obvious abnormalities Eyes: No corneal or conjunctival inflammation noted. No icterus Nose: External nasal exam reveals no deformity or inflammation. Nasal mucosa are pink and moist. No lesions or exudates noted.   Mouth: Oral mucosa and oropharynx reveal no lesions or exudates. Neck: No deformities, masses, or tenderness noted. Range of motion WNL. Thyroid palpable, small. Lungs: Normal respiratory effort; chest expands symmetrically. Lungs are clear to auscultation without rales, wheezes, or increased work of breathing. Heart: Normal rate and rhythm. Normal S1 and S2. No gallop, click, or rub. Grade 1/6 systolic murmur Abdomen: Bowel sounds normal; abdomen soft and nontender. No masses, organomegaly or hernias noted.No suprapubic distention.                              Musculoskeletal/extremities: No clubbing, cyanosis, or edema.Lipedema present.Chronic toenail changes.Pes planus  Vascular: Carotid, radial artery, dorsalis pedis and  posterior tibial pulses are equal but pedal pulses decreased. Faint carotid bruits present. Neurologic: Alert  and oriented x3. Deep tendon reflexes symmetrical decreased @ knees  Skin: Intact without suspicious lesions or rashes.No ischemic changes.See diabetic foot exam skin changes Lymph: No cervical, axillary lymphadenopathy present. Psych: Mood and affect are normal. Normally interactive                                                                                        Assessment & Plan:  #1 foot deformities  #2 callus formation  #3 diabetic neuropathy  #4 urinary incontinence  See plan

## 2014-01-26 ENCOUNTER — Telehealth: Payer: Self-pay

## 2014-01-26 NOTE — Telephone Encounter (Signed)
Relevant patient education mailed to patient.  

## 2014-01-30 ENCOUNTER — Ambulatory Visit (INDEPENDENT_AMBULATORY_CARE_PROVIDER_SITE_OTHER): Payer: Medicare Other | Admitting: Endocrinology

## 2014-01-30 ENCOUNTER — Telehealth: Payer: Self-pay | Admitting: *Deleted

## 2014-01-30 ENCOUNTER — Encounter: Payer: Self-pay | Admitting: Endocrinology

## 2014-01-30 VITALS — BP 126/84 | HR 87 | Temp 97.0°F | Ht 63.0 in | Wt 201.0 lb

## 2014-01-30 DIAGNOSIS — E1129 Type 2 diabetes mellitus with other diabetic kidney complication: Secondary | ICD-10-CM | POA: Diagnosis not present

## 2014-01-30 DIAGNOSIS — E1165 Type 2 diabetes mellitus with hyperglycemia: Principal | ICD-10-CM

## 2014-01-30 LAB — BASIC METABOLIC PANEL
BUN: 23 mg/dL (ref 6–23)
CHLORIDE: 105 meq/L (ref 96–112)
CO2: 30 mEq/L (ref 19–32)
CREATININE: 1.2 mg/dL (ref 0.4–1.2)
Calcium: 9.8 mg/dL (ref 8.4–10.5)
GFR: 43.57 mL/min — ABNORMAL LOW (ref >60.00)
Glucose, Bld: 160 mg/dL — ABNORMAL HIGH (ref 70–99)
POTASSIUM: 4.1 meq/L (ref 3.5–5.1)
SODIUM: 140 meq/L (ref 135–145)

## 2014-01-30 LAB — HEMOGLOBIN A1C: HEMOGLOBIN A1C: 8.1 % — AB (ref 4.6–6.5)

## 2014-01-30 MED ORDER — ACARBOSE 50 MG PO TABS: 50.0000 mg | ORAL_TABLET | Freq: Three times a day (TID) | ORAL | Status: DC

## 2014-01-30 MED ORDER — REPAGLINIDE 2 MG PO TABS: 2.0000 mg | ORAL_TABLET | Freq: Three times a day (TID) | ORAL | Status: DC

## 2014-01-30 NOTE — Patient Instructions (Addendum)
check your blood sugar twice a day.  vary the time of day when you check, between before the 3 meals, and at bedtime.  also check if you have symptoms of your blood sugar being too high or too low.  please keep a record of the readings and bring it to your next appointment here.  please call us sooner if your blood sugar goes below 70, or if you have a lot of readings over 200.   Please come back for a follow-up appointment in 3 months.   blood tests are being requested for you today.  We'll contact you with results. Based on the results, we may need to increase the "acarbose," or change nategline to repaglinide, or both.  i'll let you know.

## 2014-01-30 NOTE — Progress Notes (Signed)
Subjective:    Patient ID: [name], female    DOB: 29-Jun-1925, 80 y.o.   MRN: [id number]  HPI Pt returns for f/u of type 2 DM (dx'ed 2001; she has mild neuropathy of the lower extremities; she has associated complications of CVA, renal insuff, and CAD; she is not on metformin, due to chf, she is not on actos, due to edema; she is off amaryl, due to renal insuff; she is taking invokana despite the cost).  Pt refuses insulin.  she brings a record of her cbg's which i have reviewed today.  It varies from 130-200.  There is no trend throughout the day.   Past Medical History  Diagnosis Date  . Sliding hiatal hernia   . Erosive esophagitis   . Peptic stricture of esophagus   . Diverticulosis   . Arthritis   . Hypothyroidism   . Diabetes mellitus     Type I  . Coronary artery disease     s/p CABG 2001  . Sick sinus syndrome     s/p Medtronic DDD pacemaker implanted; generator change 02/2012  . Hypertension   . Hyperlipidemia   . GERD (gastroesophageal reflux disease)   . Adenomatous colon polyp 07/2002  . CVA (cerebral vascular accident)     Dr Evelena Leyden, Neurology  . Obesity     Past Surgical History  Procedure Laterality Date  . Pacemaker placement  03/25/2000    by Dr Olevia Perches  . Tonsillectomy and adenoidectomy    . Appendectomy    . Cholecystectomy    . Total abdominal hysterectomy w/ bilateral salpingoophorectomy      Endometiosis  . Replacement total knee  2004    Right  . Colonoscopy w/ polypectomy  2003  . Cataract extraction  05/2005  . Coronary artery bypass graft  02/2000    5 vessel  . Rotator cuff repair    . Pacemaker generator replacement  03/2012    Dr. Rayann Heman    History   Social History  . Marital Status: Widowed    Spouse Name: N/A    Number of Children: 1  . Years of Education: N/A   Occupational History  . Retired    Social History Main Topics  . Smoking status: Former Research scientist (life sciences)  . Smokeless tobacco: Former Systems developer    Quit date: 11/23/1981   Comment: quit 28+ yrs ago (as of 2012)  . Alcohol Use: No  . Drug Use: No  . Sexual Activity: No   Other Topics Concern  . Not on file   Social History Narrative   Lives with her grandson and his wife.   Daily caffeine use.    Current Outpatient Prescriptions on File Prior to Visit  Medication Sig Dispense Refill  . atorvastatin (LIPITOR) 20 MG tablet TAKE 1 TABLET BY MOUTH EVERY DAY (IN PLACE OF PRAVASTATIN)  90 tablet  3  . bromocriptine (PARLODEL) 2.5 MG tablet Take 1 tablet (2.5 mg total) by mouth at bedtime.  30 tablet  11  . Calcium Carbonate (CALCIUM 500 PO) Take 500 mg by mouth once.       . Canagliflozin (INVOKANA) 100 MG TABS Take 1 tablet (100 mg total) by mouth daily.  90 tablet  2  . clopidogrel (PLAVIX) 75 MG tablet TAKE 1 TABLET EVERY DAY  90 tablet  2  . colesevelam (WELCHOL) 625 MG tablet Take 3 tablets (1,875 mg total) by mouth 2 (two) times daily before a meal.  540 tablet  3  .  enalapril (VASOTEC) 20 MG tablet Take 1 tablet (20 mg total) by mouth daily.  30 tablet  1  . fish oil-omega-3 fatty acids 1000 MG capsule Take 1 g by mouth daily.      Marland Kitchen levothyroxine (SYNTHROID, LEVOTHROID) 25 MCG tablet 1 1/2 by mouth daily  135 tablet  3  . metoprolol (TOPROL-XL) 200 MG 24 hr tablet TAKE 1 TABLET EVERY DAY  30 tablet  3  . mirabegron ER (MYRBETRIQ) 25 MG TB24 tablet Take 1 tablet (25 mg total) by mouth daily.  14 tablet  0  . NAMENDA 10 MG tablet TAKE 1 TABLET BY MOUTH TWICE A DAY  60 tablet  2  . niacin 500 MG tablet Take 500 mg by mouth daily with breakfast.       . nitroGLYCERIN (NITROSTAT) 0.3 MG SL tablet Place 1 tablet (0.3 mg total) under the tongue every 5 (five) minutes as needed. For chest pain  90 tablet  3  . pantoprazole (PROTONIX) 40 MG tablet TAKE 1 TABLET (40 MG TOTAL) BY MOUTH DAILY.  90 tablet  1   No current facility-administered medications on file prior to visit.    Allergies  Allergen Reactions  . Codeine     Rash     Family History    Problem Relation Age of Onset  . Colon cancer Mother 71  . Breast cancer Mother   . Transient ischemic attack Mother   . Cancer Mother   . Hypertension Mother   . Stroke Mother   . Diabetes Mother   . Colon cancer Brother 20  . Cancer Brother   . Hypertension Brother   . Heart attack Brother   . Breast cancer Sister   . Cancer Sister   . Diabetes Sister   . Prostate cancer Brother   . Heart attack Brother   . Heart disease      3 brothers had MI; 1 pre 52  . Hypertension Sister   . Heart attack Daughter   . Diabetes Maternal Aunt     BP 126/84  Pulse 87  Temp(Src) 97 F (36.1 C) (Oral)  Ht 5\' 3"  (1.6 m)  Wt 201 lb (91.173 kg)  BMI 35.61 kg/m2  SpO2 95%  Review of Systems denies hypoglycemia and weight change.    Objective:   Physical Exam VITAL SIGNS:  See vs page GENERAL: no distress  Lab Results  Component Value Date   HGBA1C 8.1* 01/30/2014      Assessment & Plan:  DM: We discussed the nine oral agents available for type 2 diabetes.  This oral regimen gives the best available risk-benefit ratio.  She needs increased rx, if it can be done with a regimen that avoids or minimizes hypoglycemia.  Renal insufficiency: this limits oral agent options.

## 2014-01-30 NOTE — Telephone Encounter (Signed)
Pt states someone called yesterday, and she's not sure, but "Tell Marcie Bal to call me."

## 2014-02-08 ENCOUNTER — Other Ambulatory Visit: Payer: Self-pay | Admitting: Neurology

## 2014-02-09 ENCOUNTER — Telehealth: Payer: Self-pay | Admitting: *Deleted

## 2014-02-09 DIAGNOSIS — R32 Unspecified urinary incontinence: Secondary | ICD-10-CM

## 2014-02-09 MED ORDER — MIRABEGRON ER 25 MG PO TB24: 25.0000 mg | ORAL_TABLET | Freq: Every day | ORAL | Status: DC

## 2014-02-09 NOTE — Telephone Encounter (Signed)
Rx sent to the pharmacy by e-script.  [name]([name]) aware.//AB/CMA

## 2014-02-09 NOTE — Telephone Encounter (Signed)
MAINTENANCE medication; mail order appropriate

## 2014-02-09 NOTE — Telephone Encounter (Signed)
[name]([name]) to say that the pt was given samples of Myrbetriq and it is helping.  So she would like a prescription for it.  She would also like to know how long the pt will be on it short or long term.  Please advise.//AB/CMA

## 2014-02-15 ENCOUNTER — Ambulatory Visit (INDEPENDENT_AMBULATORY_CARE_PROVIDER_SITE_OTHER): Payer: Medicare Other | Admitting: Podiatrist

## 2014-02-15 ENCOUNTER — Encounter: Payer: Self-pay | Admitting: Podiatrist

## 2014-02-15 VITALS — BP 141/78 | HR 76 | Resp 18 | Ht 65.0 in | Wt 200.0 lb

## 2014-02-15 DIAGNOSIS — M79609 Pain in unspecified limb: Secondary | ICD-10-CM | POA: Diagnosis not present

## 2014-02-15 DIAGNOSIS — E1149 Type 2 diabetes mellitus with other diabetic neurological complication: Secondary | ICD-10-CM | POA: Diagnosis not present

## 2014-02-15 DIAGNOSIS — B351 Tinea unguium: Secondary | ICD-10-CM | POA: Diagnosis not present

## 2014-02-15 MED ORDER — AMITRIPTYLINE HCL 25 MG PO TABS: 25.0000 mg | ORAL_TABLET | Freq: Every day | ORAL | Status: DC

## 2014-02-15 NOTE — Progress Notes (Signed)
   Subjective:    Patient ID: [name], female    DOB: 26-Jan-1925, 80 y.o.   MRN: [id number]  HPI Comments: Diabetic foot care. Last a1c is 8.1 , starting to have burning , and tingling sensations in the feet.  Diabetes Hypoglycemia symptoms include confusion. Associated symptoms include weakness.      Review of Systems  HENT:       Sinus problems  Gastrointestinal: Positive for constipation.  Endocrine:       Diabetes   Genitourinary: Positive for urgency.       Increased urination  Musculoskeletal: Positive for back pain.       Joint pain  Muscle pain  Skin:       Change in nails   Allergic/Immunologic: Positive for environmental allergies.  Neurological: Positive for weakness and numbness.  Psychiatric/Behavioral: Positive for confusion.  All other systems reviewed and are negative.       Objective:   Physical Exam GENERAL APPEARANCE: Alert, conversant. Appropriately groomed. No acute distress.  VASCULAR: Pedal pulses palpable at 2/4 DP and PT bilateral.  Capillary refill time is immediate to all digits,  Proximal to distal cooling it warm to warm.  Mild swelling is present to bilateral fee and ankles NEUROLOGIC: sensation is decreased epicritically and protectively to 5.07 monofilament at 2/10 sites left and 4/10 sites right.  Light touch is intact bilateral, vibratory sensation absent bilateral,  patient is complaining of numbness and tingling sensations. MUSCULOSKELETAL: Prominent plantarflexed first and fifth metatarsals are present bilateral. Pes planus foot type is also seen bilateral. acceptable muscle strength, tone and stability bilateral.  Intrinsic muscluature intact bilateral.   DERMATOLOGIC: Agents another elongated, thickened, dystrophic and mycotic. Otherwise skin color, texture, and turger are within normal limits.  No preulcerative lesions are seen, no interdigital maceration noted.  No open lesions present.  Fat pad atrophy is noted submetatarsal one  and 5 of bilateral feet.    Assessment & Plan:  Diabetes with neuropathy, symptomatic mycotic toenails, prominent plantarflexed metatarsals with fat pad atrophy overlying Plan: Debridement was carried out today without complication of her digital nails. Also recommended diabetic shoes with accommodative inserts to accommodate for her prominent plantarflexed metatarsals, pes planus deformity and neuropathy. I feel she would benefit from the diabetic shoes to prevent ulceration in the future and these prominent areas of bilateral feet. We will send a request to Dr. Milinda Cave office for diabetic shoes. I will see her back when these are ready for pick up. I also started her on amitriptyline today. She will try this out for 2-3 weeks and decided it's beneficial.

## 2014-02-15 NOTE — Patient Instructions (Addendum)
Amitriptyline tablets What is this medicine? AMITRIPTYLINE (a mee TRIP ti leen) is primarily used to treat depression but it is also good for diabetic neuropathy-- COMMON BRAND NAME(S): Elavil, Vanatrip What should I tell my health care provider before I take this medicine? They need to know if you have any of these conditions: -an alcohol problem -asthma, difficulty breathing -bipolar disorder or schizophrenia -difficulty passing urine, prostate trouble -glaucoma -heart disease or previous heart attack -liver disease -over active thyroid -seizures -thoughts or plans of suicide, a previous suicide attempt, or family history of suicide attempt -an unusual or allergic reaction to amitriptyline, other medicines, foods, dyes, or preservatives -pregnant or trying to get pregnant -breast-feeding How should I use this medicine? Take this medicine by mouth with a drink of water. Follow the directions on the prescription label. You can take the tablets with or without food. Take your medicine at regular intervals. Do not take it more often than directed. Do not stop taking this medicine suddenly except upon the advice of your doctor. Stopping this medicine too quickly may cause serious side effects or your condition may worsen. A special MedGuide will be given to you by the pharmacist with each prescription and refill. Be sure to read this information carefully each time. Talk to your pediatrician regarding the use of this medicine in children. Special care may be needed. Overdosage: If you think you have taken too much of this medicine contact a poison control center or emergency room at once. NOTE: This medicine is only for you. Do not share this medicine with others. What if I miss a dose? If you miss a dose, take it as soon as you can. If it is almost time for your next dose, take only that dose. Do not take double or extra doses. What may interact with this medicine? Do not take this medicine  with any of the following medications: -arsenic trioxide -certain medicines used to regulate abnormal heartbeat or to treat other heart conditions -cisapride -droperidol -halofantrine -linezolid -MAOIs like Carbex, Eldepryl, Marplan, Nardil, and Parnate -methylene blue -other medicines for mental depression -phenothiazines like perphenazine, thioridazine and chlorpromazine -pimozide -probucol -procarbazine -sparfloxacin -St. John's Wort -ziprasidone This medicine may also interact with the following medications: -atropine and related drugs like hyoscyamine, scopolamine, tolterodine and others -barbiturate medicines for inducing sleep or treating seizures, like phenobarbital -cimetidine -disulfiram -ethchlorvynol -thyroid hormones such as levothyroxine This list may not describe all possible interactions. Give your health care provider a list of all the medicines, herbs, non-prescription drugs, or dietary supplements you use. Also tell them if you smoke, drink alcohol, or use illegal drugs. Some items may interact with your medicine. What should I watch for while using this medicine? Tell your doctor if your symptoms do not get better or if they get worse. Visit your doctor or health care professional for regular checks on your progress. Because it may take several weeks to see the full effects of this medicine, it is important to continue your treatment as prescribed by your doctor. Patients and their families should watch out for new or worsening thoughts of suicide or depression. Also watch out for sudden changes in feelings such as feeling anxious, agitated, panicky, irritable, hostile, aggressive, impulsive, severely restless, overly excited and hyperactive, or not being able to sleep. If this happens, especially at the beginning of treatment or after a change in dose, call your health care professional. Dennis Bast may get drowsy or dizzy. Do not drive, use machinery, or do  anything that  needs mental alertness until you know how this medicine affects you. Do not stand or sit up quickly, especially if you are an older patient. This reduces the risk of dizzy or fainting spells. Alcohol may interfere with the effect of this medicine. Avoid alcoholic drinks. Do not treat yourself for coughs, colds, or allergies without asking your doctor or health care professional for advice. Some ingredients can increase possible side effects. Your mouth may get dry. Chewing sugarless gum or sucking hard candy, and drinking plenty of water will help. Contact your doctor if the problem does not go away or is severe. This medicine may cause dry eyes and blurred vision. If you wear contact lenses you may feel some discomfort. Lubricating drops may help. See your eye doctor if the problem does not go away or is severe. This medicine can cause constipation. Try to have a bowel movement at least every 2 to 3 days. If you do not have a bowel movement for 3 days, call your doctor or health care professional. This medicine can make you more sensitive to the sun. Keep out of the sun. If you cannot avoid being in the sun, wear protective clothing and use sunscreen. Do not use sun lamps or tanning beds/booths. What side effects may I notice from receiving this medicine? Side effects that you should report to your doctor or health care professional as soon as possible: -allergic reactions like skin rash, itching or hives, swelling of the face, lips, or tongue -abnormal production of milk in females -breast enlargement in both males and females -breathing problems -confusion, hallucinations -fast, irregular heartbeat -fever with increased sweating -muscle stiffness, or spasms -pain or difficulty passing urine, loss of bladder control -seizures -suicidal thoughts or other mood changes -swelling of the testicles -tingling, pain, or numbness in the feet or hands -yellowing of the eyes or skin Side effects that  usually do not require medical attention (report to your doctor or health care professional if they continue or are bothersome): -change in sex drive or performance -constipation or diarrhea -nausea, vomiting -weight gain or loss This list may not describe all possible side effects. Call your doctor for medical advice about side effects. You may report side effects to FDA at 1-800-FDA-1088. Where should I keep my medicine? Keep out of the reach of children. Store at room temperature between 20 and 25 degrees C (68 and 77 degrees F). Throw away any unused medicine after the expiration date. NOTE: This sheet is a summary. It may not cover all possible information. If you have questions about this medicine, talk to your doctor, pharmacist, or health care provider.  2014, Elsevier/Gold Standard. (2012-03-28 13:50:32)   Diabetes and Foot Care Diabetes may cause you to have problems because of poor blood supply (circulation) to your feet and legs. This may cause the skin on your feet to become thinner, break easier, and heal more slowly. Your skin may become dry, and the skin may peel and crack. You may also have nerve damage in your legs and feet causing decreased feeling in them. You may not notice minor injuries to your feet that could lead to infections or more serious problems. Taking care of your feet is one of the most important things you can do for yourself.  HOME CARE INSTRUCTIONS  Wear shoes at all times, even in the house. Do not go barefoot. Bare feet are easily injured.  Check your feet daily for blisters, cuts, and redness. If you cannot  see the bottom of your feet, use a mirror or ask someone for help.  Wash your feet with warm water (do not use hot water) and mild soap. Then pat your feet and the areas between your toes until they are completely dry. Do not soak your feet as this can dry your skin.  Apply a moisturizing lotion or petroleum jelly (that does not contain alcohol and  is unscented) to the skin on your feet and to dry, brittle toenails. Do not apply lotion between your toes.  Trim your toenails straight across. Do not dig under them or around the cuticle. File the edges of your nails with an emery board or nail file.  Do not cut corns or calluses or try to remove them with medicine.  Wear clean socks or stockings every day. Make sure they are not too tight. Do not wear knee-high stockings since they may decrease blood flow to your legs.  Wear shoes that fit properly and have enough cushioning. To break in new shoes, wear them for just a few hours a day. This prevents you from injuring your feet. Always look in your shoes before you put them on to be sure there are no objects inside.  Do not cross your legs. This may decrease the blood flow to your feet.  If you find a minor scrape, cut, or break in the skin on your feet, keep it and the skin around it clean and dry. These areas may be cleansed with mild soap and water. Do not cleanse the area with peroxide, alcohol, or iodine.  When you remove an adhesive bandage, be sure not to damage the skin around it.  If you have a wound, look at it several times a day to make sure it is healing.  Do not use heating pads or hot water bottles. They may burn your skin. If you have lost feeling in your feet or legs, you may not know it is happening until it is too late.  Make sure your health care provider performs a complete foot exam at least annually or more often if you have foot problems. Report any cuts, sores, or bruises to your health care provider immediately. SEEK MEDICAL CARE IF:   You have an injury that is not healing.  You have cuts or breaks in the skin.  You have an ingrown nail.  You notice redness on your legs or feet.  You feel burning or tingling in your legs or feet.  You have pain or cramps in your legs and feet.  Your legs or feet are numb.  Your feet always feel cold. SEEK IMMEDIATE  MEDICAL CARE IF:   There is increasing redness, swelling, or pain in or around a wound.  There is a red line that goes up your leg.  Pus is coming from a wound.  You develop a fever or as directed by your health care provider.  You notice a bad smell coming from an ulcer or wound. Document Released: 11/06/2000 Document Revised: 07/12/2013 Document Reviewed: 04/18/2013 [facility] Patient Information 2014 [place].

## 2014-02-22 ENCOUNTER — Other Ambulatory Visit: Payer: Self-pay | Admitting: Endocrinology

## 2014-03-05 ENCOUNTER — Telehealth: Payer: Self-pay | Admitting: *Deleted

## 2014-03-05 ENCOUNTER — Ambulatory Visit (INDEPENDENT_AMBULATORY_CARE_PROVIDER_SITE_OTHER): Payer: Medicare Other | Admitting: *Deleted

## 2014-03-05 DIAGNOSIS — I495 Sick sinus syndrome: Secondary | ICD-10-CM | POA: Diagnosis not present

## 2014-03-05 NOTE — Telephone Encounter (Signed)
Sent over a refill request for a prescription change due to drug interaction.  Dr. Valentina Lucks wrote a prescription Gabapentin 100mg . Sig: one tab po qhs, qty. 30, one additional refill.  Prescription was mailed to the patient.

## 2014-03-07 LAB — MDC_IDC_ENUM_SESS_TYPE_REMOTE
Battery Impedance: 135 Ohm
Battery Remaining Longevity: 132 mo
Brady Statistic AP VP Percent: 1 %
Brady Statistic AS VS Percent: 15 %
Lead Channel Impedance Value: 410 Ohm
Lead Channel Setting Pacing Amplitude: 2 V
Lead Channel Setting Pacing Amplitude: 2.5 V
Lead Channel Setting Sensing Sensitivity: 5.6 mV
MDC IDC MSMT BATTERY VOLTAGE: 2.78 V
MDC IDC MSMT LEADCHNL RA PACING THRESHOLD AMPLITUDE: 0.625 V
MDC IDC MSMT LEADCHNL RA PACING THRESHOLD PULSEWIDTH: 0.4 ms
MDC IDC MSMT LEADCHNL RV IMPEDANCE VALUE: 759 Ohm
MDC IDC MSMT LEADCHNL RV PACING THRESHOLD AMPLITUDE: 1.25 V
MDC IDC MSMT LEADCHNL RV PACING THRESHOLD PULSEWIDTH: 0.4 ms
MDC IDC MSMT LEADCHNL RV SENSING INTR AMPL: 16 mV
MDC IDC SESS DTM: 20150413204718
MDC IDC SET LEADCHNL RV PACING PULSEWIDTH: 0.4 ms
MDC IDC STAT BRADY AP VS PERCENT: 84 %
MDC IDC STAT BRADY AS VP PERCENT: 0 %

## 2014-03-13 ENCOUNTER — Other Ambulatory Visit: Payer: Self-pay | Admitting: Internal Medicine

## 2014-03-16 ENCOUNTER — Encounter: Payer: Self-pay | Admitting: *Deleted

## 2014-03-21 ENCOUNTER — Encounter: Payer: Self-pay | Admitting: Internal Medicine

## 2014-03-27 ENCOUNTER — Telehealth: Payer: Self-pay | Admitting: *Deleted

## 2014-03-27 NOTE — Telephone Encounter (Signed)
Spoke to daughter and she would like the patient to come in sooner, she sees a decline in mother and may have to take a year off to take care of her.  I will return call tomorrow to see if I can get her in sooner.

## 2014-03-28 NOTE — Telephone Encounter (Signed)
Patient scheduled for RV on 04-05-14.  Daughter notified.

## 2014-04-05 ENCOUNTER — Ambulatory Visit (INDEPENDENT_AMBULATORY_CARE_PROVIDER_SITE_OTHER): Payer: Medicare Other | Admitting: Neurology

## 2014-04-05 ENCOUNTER — Encounter: Payer: Self-pay | Admitting: Neurology

## 2014-04-05 VITALS — BP 140/75 | HR 61 | Ht 66.0 in | Wt 187.0 lb

## 2014-04-05 DIAGNOSIS — R413 Other amnesia: Secondary | ICD-10-CM | POA: Insufficient documentation

## 2014-04-05 DIAGNOSIS — E039 Hypothyroidism, unspecified: Secondary | ICD-10-CM

## 2014-04-05 DIAGNOSIS — I10 Essential (primary) hypertension: Secondary | ICD-10-CM

## 2014-04-05 DIAGNOSIS — R5381 Other malaise: Secondary | ICD-10-CM

## 2014-04-05 DIAGNOSIS — E785 Hyperlipidemia, unspecified: Secondary | ICD-10-CM | POA: Diagnosis not present

## 2014-04-05 DIAGNOSIS — Z950 Presence of cardiac pacemaker: Secondary | ICD-10-CM

## 2014-04-05 DIAGNOSIS — IMO0001 Reserved for inherently not codable concepts without codable children: Secondary | ICD-10-CM

## 2014-04-05 DIAGNOSIS — R5383 Other fatigue: Secondary | ICD-10-CM

## 2014-04-05 DIAGNOSIS — E1129 Type 2 diabetes mellitus with other diabetic kidney complication: Secondary | ICD-10-CM

## 2014-04-05 DIAGNOSIS — E1165 Type 2 diabetes mellitus with hyperglycemia: Secondary | ICD-10-CM

## 2014-04-05 MED ORDER — DONEPEZIL HCL 10 MG PO TABS: ORAL_TABLET | ORAL | Status: DC

## 2014-04-05 MED ORDER — MEMANTINE HCL ER 28 MG PO CP24: 28.0000 mg | ORAL_CAPSULE | Freq: Every day | ORAL | Status: DC

## 2014-04-05 NOTE — Progress Notes (Signed)
PATIENT: [name] DOB: [birth year]  HISTORICAL  [name]  80 year old right-handed Caucasian female, accompanied by her daughter and  son-in-law and daughter at today's clinical visit.  I saw her intitially in 04/2010, She is referred by her primary care Dr. Unice Cobble for electiagnostic study, because acute onset of left hand numbness and weakness 2 weeks ago.  EMG nerve conduction study has demonstrated  a mild right carpal tunnel  syndrome, there is no evidence of left carpal tunnel syndrome, or left cervical radiculopathy.  Patient has past medical history of coronary artery disease, status post bypass, and pacemaker placement for arrhythmia, hypertension, diabetes,  hyperlipidemia, right knee replacement, baseline mild gait difficulty.   she presenting with acute onset left hand weakness and numbness 2 weeks ago,.  She was dining out with her family, suddenly  things began to drop off of her left hand, she also noticed left-hand numbness,she couldn't feel things slipping out off her hands.   Since its onset she has slight improvement, but still has intermittent left hand numbness and weakness.  She already had some baseline gait difficulty due to her right knee replacement, there is no significant worsening.she denied left facial involvement,  no dysarthria.  ECHO EF 45-50%, no thrombus, [place] cartoid A: no significant stenosis. CT head showed small vessel disease. No acute lesions.  She had 14 years of education, retired as a Optician, dispensing at age 80, took care of her grandchildren afterwards, she now lives alone at her townhouse of more than 20 years.  She is referred back by Dr. Linna Darner in June 2013., she  reported episodes of confusion, the most recent ones were 3 weeks ago, while driving on a famililar route, she got confused, but she was able to find her way back,  also reported  difficulty with people's name,word finding difficulties over the past couple years, more  obvious now, also had difficulties figuring out her checkbook  She has mild hard of hearing, left worse than right, no significant visual difficulty, repeat CAT scan showed no acute lesion, atrophy, small vessel disease, small right meningioma, no significant change, ultrasound carotid artery no significant stenosis  Laboratory showed normal TSH, elevated creatinine 1.4, A1c 7.8  Last visit was in  06/16/2012: She continued to decline over the past few months, getting more confused, has difficulty managing her medications, increased gait difficulty over past couple weeks, with new left arm, leg weakness, reported most recent ultrasound of carotid arteries showed mild to moderate stenosis, she is taking Plavix, also bromocriptine 2.5 mg twice a day for reasons wasn't sure  UPDATE Apr 05 2014 She still lives alone, more decline in memory, she has difficulty opening cans, she has helper to clean house, she has more trouble walking, more difficulty walking, cross the road.  Daughter is worry about her, that she is more forgetful, difficulty handling her medications, sometimes using walker crossing the busy major road with a lot of traffic. She has mild worsening gait difficulty, bilateral knee pain,  Her daughter wish to be on  family leave, and retires soon, so she can take care of her mother more  REVIEW OF SYSTEMS: Full 14 system review of systems performed and notable only for activity change, chills, fatigue, unexpected weight change, hearing loss, ringing in ears, running nose, blurred vision, cough, wheezing, shortness of breath, choking, chest tightness leg swelling, palpitation, cold intolerance, heat intolerance, excessive thirst, excessive eating, constipation, diarrhea, restless leg, insomnia, joints pain, dizziness, memory  loss, weakness, agitation, confusion, decreased concentration, depression, anxiety  ALLERGIES: Allergies  Allergen Reactions  . Codeine     Rash     HOME  MEDICATIONS: Current Outpatient Prescriptions on File Prior to Visit  Medication Sig Dispense Refill  . acarbose (PRECOSE) 50 MG tablet Take 1 tablet (50 mg total) by mouth 3 (three) times daily with meals.  90 tablet  11  . amitriptyline (ELAVIL) 25 MG tablet Take 1 tablet (25 mg total) by mouth at bedtime.  90 tablet  2  . atorvastatin (LIPITOR) 20 MG tablet TAKE 1 TABLET BY MOUTH EVERY DAY (IN PLACE OF PRAVASTATIN)  90 tablet  3  . bromocriptine (PARLODEL) 2.5 MG tablet Take 1 tablet (2.5 mg total) by mouth at bedtime.  30 tablet  11  . Calcium Carbonate (CALCIUM 500 PO) Take 500 mg by mouth once.       . Canagliflozin (INVOKANA) 100 MG TABS Take 1 tablet (100 mg total) by mouth daily.  90 tablet  2  . clopidogrel (PLAVIX) 75 MG tablet TAKE 1 TABLET EVERY DAY  90 tablet  2  . colesevelam (WELCHOL) 625 MG tablet Take 3 tablets (1,875 mg total) by mouth 2 (two) times daily before a meal.  540 tablet  3  . enalapril (VASOTEC) 20 MG tablet Take 1 tablet (20 mg total) by mouth daily.  30 tablet  1  . fish oil-omega-3 fatty acids 1000 MG capsule Take 1 g by mouth daily.      Marland Kitchen levothyroxine (SYNTHROID, LEVOTHROID) 25 MCG tablet 1 1/2 by mouth daily  135 tablet  3  . metoprolol (TOPROL-XL) 200 MG 24 hr tablet TAKE 1 TABLET EVERY DAY  30 tablet  3  . mirabegron ER (MYRBETRIQ) 25 MG TB24 tablet Take 1 tablet (25 mg total) by mouth daily.  90 tablet  1  . NAMENDA 10 MG tablet TAKE 1 TABLET BY MOUTH TWICE A DAY  180 tablet  0  . nateglinide (STARLIX) 120 MG tablet TAKE 1 TABLET (120 MG TOTAL) BY MOUTH 3 (THREE) TIMES DAILY BEFORE MEALS.  90 tablet  2  . niacin 500 MG tablet Take 500 mg by mouth daily with breakfast.       . nitroGLYCERIN (NITROSTAT) 0.3 MG SL tablet Place 1 tablet (0.3 mg total) under the tongue every 5 (five) minutes as needed. For chest pain  90 tablet  3  . pantoprazole (PROTONIX) 40 MG tablet TAKE 1 TABLET (40 MG TOTAL) BY MOUTH DAILY.  90 tablet  1  . repaglinide (PRANDIN) 2 MG  tablet Take 1 tablet (2 mg total) by mouth 3 (three) times daily before meals.  90 tablet  11   No current facility-administered medications on file prior to visit.    PAST MEDICAL HISTORY: Past Medical History  Diagnosis Date  . Sliding hiatal hernia   . Erosive esophagitis   . Peptic stricture of esophagus   . Diverticulosis   . Arthritis   . Hypothyroidism   . Diabetes mellitus     Type I  . Coronary artery disease     s/p CABG 2001  . Sick sinus syndrome     s/p Medtronic DDD pacemaker implanted; generator change 02/2012  . Hypertension   . Hyperlipidemia   . GERD (gastroesophageal reflux disease)   . Adenomatous colon polyp 07/2002  . CVA (cerebral vascular accident)     Dr Evelena Leyden, Neurology  . Obesity     PAST SURGICAL HISTORY: Past Surgical History  Procedure Laterality Date  . Pacemaker placement  03/25/2000    by Dr Olevia Perches  . Tonsillectomy and adenoidectomy    . Appendectomy    . Cholecystectomy    . Total abdominal hysterectomy w/ bilateral salpingoophorectomy      Endometiosis  . Replacement total knee  2004    Right  . Colonoscopy w/ polypectomy  2003  . Cataract extraction  05/2005  . Coronary artery bypass graft  02/2000    5 vessel  . Rotator cuff repair    . Pacemaker generator replacement  03/2012    Dr. Rayann Heman    FAMILY HISTORY: Family History  Problem Relation Age of Onset  . Colon cancer Mother 69  . Breast cancer Mother   . Transient ischemic attack Mother   . Cancer Mother   . Hypertension Mother   . Stroke Mother   . Diabetes Mother   . Colon cancer Brother 43  . Cancer Brother   . Hypertension Brother   . Heart attack Brother   . Breast cancer Sister   . Cancer Sister   . Diabetes Sister   . Prostate cancer Brother   . Heart attack Brother   . Heart disease      3 brothers had MI; 1 pre 37  . Hypertension Sister   . Heart attack Daughter   . Diabetes Maternal Aunt     SOCIAL HISTORY:  History   Social History  . Marital  Status: Widowed    Spouse Name: N/A    Number of Children: 1  . Years of Education: N/A   Occupational History  . Retired    Social History Main Topics  . Smoking status: Former Research scientist (life sciences)  . Smokeless tobacco: Former Systems developer    Quit date: 11/23/1981     Comment: quit 28+ yrs ago (as of 2012)  . Alcohol Use: No  . Drug Use: No  . Sexual Activity: No   Other Topics Concern  . Not on file   Social History Narrative   Patient lives at home alone. Patient's daughter was with her [name] ) [phone]   Retired.   Education college   Right handed.   Caffeine one cup of coffee daily.   Marland Kitchen     PHYSICAL EXAM   Filed Vitals:   04/05/14 0837  Height: 5\' 6"  (1.676 m)  Weight: 187 lb (84.823 kg)    Not recorded    Body mass index is 30.2 kg/(m^2).   Generalized: In no acute distress  Neck: Supple, no carotid bruits   Cardiac: Regular rate rhythm  Pulmonary: Clear to auscultation bilaterally  Musculoskeletal: No deformity  Neurological examination  Mentation: Alert oriented to time, place, history taking, and causual conversation, Mini-Mental Status Examination 28 out of 30, she missed 1 out of 3 recalls, mild difficulty copy design  Cranial nerve II-XII: Pupils were equal round reactive to light. Extraocular movements were full.  Visual field were full on confrontational test. Bilateral fundi were sharp.  Facial sensation and strength were normal. Hearing was intact to finger rubbing bilaterally. Uvula tongue midline.  Head turning and shoulder shrug and were normal and symmetric.Tongue protrusion into cheek strength was normal.  Motor: Normal tone, bulk and strength.  Sensory: Intact to fine touch   Coordination: Normal finger to nose, heel-to-shin bilaterally there was no truncal ataxia  Gait: Rising up from seated position pushing on chair, wide based, cautious, rely on walker.   Romberg signs: Negative  Deep tendon reflexes:  hypoactive and symmetric,  plantar responses were flexor bilaterally.   DIAGNOSTIC DATA (LABS, IMAGING, TESTING) - I reviewed patient records, labs, notes, testing and imaging myself where available.  Lab Results  Component Value Date   WBC 6.4 03/28/2013   HGB 12.1 03/28/2013   HCT 35.2* 03/28/2013   MCV 86.3 03/28/2013   PLT 170.0 03/28/2013      Component Value Date/Time   NA 140 01/30/2014 1002   K 4.1 01/30/2014 1002   CL 105 01/30/2014 1002   CO2 30 01/30/2014 1002   GLUCOSE 160* 01/30/2014 1002   BUN 23 01/30/2014 1002   CREATININE 1.2 01/30/2014 1002   CALCIUM 9.8 01/30/2014 1002   PROT 7.2 11/24/2012 0949   ALBUMIN 4.0 11/24/2012 0949   AST 25 11/24/2012 0949   ALT 23 11/24/2012 0949   ALKPHOS 20* 11/24/2012 0949   BILITOT 0.7 11/24/2012 0949   GFRNONAA 34* 03/12/2012 1424   GFRAA 39* 03/12/2012 1424   Lab Results  Component Value Date   CHOL 217* 11/24/2012   HDL 44.30 11/24/2012   LDLDIRECT 120.8 11/24/2012   TRIG 386.0* 11/24/2012   CHOLHDL 5 11/24/2012   Lab Results  Component Value Date   HGBA1C 8.1* 01/30/2014   Lab Results  Component Value Date   VITAMINB12 280 07/24/2010   Lab Results  Component Value Date   TSH 1.41 03/28/2013    ASSESSMENT AND PLAN  [name] is a 80 y.o. female with gradual onset memory loss, MMSE 28/30 today, mild gait difficulty.  1. Refill Nemanda xr 28mg  qday. 2. Add on Aricept 10mg  qday. 3. Moderate exercise. 4. RTC in one year with [name].   Marcial Pacas, M.D. Ph.D.  [facility] [address], [place], [place] [address] [phone]

## 2014-04-09 ENCOUNTER — Other Ambulatory Visit: Payer: Self-pay | Admitting: Internal Medicine

## 2014-04-23 ENCOUNTER — Other Ambulatory Visit: Payer: Self-pay

## 2014-04-23 MED ORDER — LEVOTHYROXINE SODIUM 25 MCG PO TABS: ORAL_TABLET | ORAL | Status: DC

## 2014-04-25 ENCOUNTER — Ambulatory Visit: Payer: Self-pay | Admitting: Neurology

## 2014-04-26 ENCOUNTER — Other Ambulatory Visit: Payer: Self-pay

## 2014-04-26 MED ORDER — PANTOPRAZOLE SODIUM 40 MG PO TBEC: DELAYED_RELEASE_TABLET | ORAL | Status: DC

## 2014-05-09 ENCOUNTER — Telehealth: Payer: Self-pay | Admitting: *Deleted

## 2014-05-09 ENCOUNTER — Encounter: Payer: Self-pay | Admitting: Cardiology

## 2014-05-09 DIAGNOSIS — J040 Acute laryngitis: Secondary | ICD-10-CM | POA: Diagnosis not present

## 2014-05-09 DIAGNOSIS — N318 Other neuromuscular dysfunction of bladder: Secondary | ICD-10-CM | POA: Diagnosis not present

## 2014-05-09 DIAGNOSIS — N3941 Urge incontinence: Secondary | ICD-10-CM | POA: Diagnosis not present

## 2014-05-09 NOTE — Telephone Encounter (Signed)
Calling regarding getting shoes made.  Waiting to hear back from Dr. Jodi Mourning that he okayed it.  It's been since April and we haven't heard anything.  Please give me a call.

## 2014-05-12 ENCOUNTER — Other Ambulatory Visit: Payer: Self-pay | Admitting: Internal Medicine

## 2014-05-14 ENCOUNTER — Ambulatory Visit: Payer: Medicare Other | Admitting: Endocrinology

## 2014-05-21 ENCOUNTER — Ambulatory Visit (INDEPENDENT_AMBULATORY_CARE_PROVIDER_SITE_OTHER): Payer: Medicare Other | Admitting: Endocrinology

## 2014-05-21 ENCOUNTER — Encounter: Payer: Self-pay | Admitting: Endocrinology

## 2014-05-21 VITALS — BP 132/82 | HR 81 | Temp 98.3°F | Ht 66.0 in | Wt 182.0 lb

## 2014-05-21 DIAGNOSIS — E1129 Type 2 diabetes mellitus with other diabetic kidney complication: Secondary | ICD-10-CM | POA: Diagnosis not present

## 2014-05-21 DIAGNOSIS — E1165 Type 2 diabetes mellitus with hyperglycemia: Principal | ICD-10-CM

## 2014-05-21 LAB — HEMOGLOBIN A1C: HEMOGLOBIN A1C: 8.2 % — AB (ref 4.6–6.5)

## 2014-05-21 MED ORDER — SITAGLIPTIN PHOSPHATE 100 MG PO TABS: 100.0000 mg | ORAL_TABLET | Freq: Every day | ORAL | Status: DC

## 2014-05-21 NOTE — Progress Notes (Signed)
Subjective:    Patient ID: [name], female    DOB: 10-Apr-1925, 80 y.o.   MRN: [id number]  HPI Pt returns for f/u of type 2 DM (dx'ed 2001; she has mild neuropathy of the lower extremities; she has associated complications of CVA, renal insuff, and CAD; she is not on metformin, due to CHF, she is not on actos, due to edema; she is off amaryl, due to renal insuff; she takes 5 oral meds; she is taking invokana despite the cost).  Pt says she'll take insulin if necessary.  she brings a record of her cbg's which i have reviewed today.  It varies from 92-206.  There is no trend throughout the day.  History is obtained from patient and dtr. Past Medical History  Diagnosis Date  . Sliding hiatal hernia   . Erosive esophagitis   . Peptic stricture of esophagus   . Diverticulosis   . Arthritis   . Hypothyroidism   . Diabetes mellitus     Type I  . Coronary artery disease     s/p CABG 2001  . Sick sinus syndrome     s/p Medtronic DDD pacemaker implanted; generator change 02/2012  . Hypertension   . Hyperlipidemia   . GERD (gastroesophageal reflux disease)   . Adenomatous colon polyp 07/2002  . CVA (cerebral vascular accident)     Dr Evelena Leyden, Neurology  . Obesity     Past Surgical History  Procedure Laterality Date  . Pacemaker placement  03/25/2000    by Dr Olevia Perches  . Tonsillectomy and adenoidectomy    . Appendectomy    . Cholecystectomy    . Total abdominal hysterectomy w/ bilateral salpingoophorectomy      Endometiosis  . Replacement total knee  2004    Right  . Colonoscopy w/ polypectomy  2003  . Cataract extraction  05/2005  . Coronary artery bypass graft  02/2000    5 vessel  . Rotator cuff repair    . Pacemaker generator replacement  03/2012    Dr. Rayann Heman    History   Social History  . Marital Status: Widowed    Spouse Name: N/A    Number of Children: 1  . Years of Education: N/A   Occupational History  . Retired    Social History Main Topics  . Smoking status:  Former Research scientist (life sciences)  . Smokeless tobacco: Former Systems developer    Quit date: 11/23/1981     Comment: quit 28+ yrs ago (as of 2012)  . Alcohol Use: No  . Drug Use: No  . Sexual Activity: No   Other Topics Concern  . Not on file   Social History Narrative   Patient lives at home alone. Patient's daughter was with her [name] ) [phone]   Retired.   Education college   Right handed.   Caffeine one cup of coffee daily.   .    Current Outpatient Prescriptions on File Prior to Visit  Medication Sig Dispense Refill  . acarbose (PRECOSE) 50 MG tablet Take 1 tablet (50 mg total) by mouth 3 (three) times daily with meals.  90 tablet  11  . amitriptyline (ELAVIL) 25 MG tablet Take 1 tablet (25 mg total) by mouth at bedtime.  90 tablet  2  . atorvastatin (LIPITOR) 20 MG tablet TAKE 1 TABLET BY MOUTH EVERY DAY (IN PLACE OF PRAVASTATIN)  90 tablet  3  . bromocriptine (PARLODEL) 2.5 MG tablet Take 1 tablet (2.5 mg total) by mouth at  bedtime.  30 tablet  11  . Calcium Carbonate (CALCIUM 500 PO) Take 500 mg by mouth once.       . Canagliflozin (INVOKANA) 100 MG TABS Take 1 tablet (100 mg total) by mouth daily.  90 tablet  2  . clopidogrel (PLAVIX) 75 MG tablet TAKE 1 TABLET EVERY DAY  90 tablet  0  . colesevelam (WELCHOL) 625 MG tablet Take 3 tablets (1,875 mg total) by mouth 2 (two) times daily before a meal.  540 tablet  3  . donepezil (ARICEPT) 10 MG tablet 1/2 tabs po qday after meal xone week, then one tab po qday  30 tablet  12  . enalapril (VASOTEC) 20 MG tablet Take 1 tablet (20 mg total) by mouth daily.  30 tablet  1  . enalapril (VASOTEC) 20 MG tablet TAKE 1 TABLET (20 MG TOTAL) BY MOUTH DAILY.  90 tablet  0  . fish oil-omega-3 fatty acids 1000 MG capsule Take 1 g by mouth daily.      Marland Kitchen levothyroxine (SYNTHROID, LEVOTHROID) 25 MCG tablet 1 1/2 by mouth daily  135 tablet  3  . Memantine HCl ER (NAMENDA XR) 28 MG CP24 Take 28 mg by mouth daily.  90 capsule  3  . metoprolol (TOPROL-XL) 200 MG 24  hr tablet TAKE 1 TABLET EVERY DAY  30 tablet  3  . mirabegron ER (MYRBETRIQ) 25 MG TB24 tablet Take 1 tablet (25 mg total) by mouth daily.  90 tablet  1  . niacin 500 MG tablet Take 500 mg by mouth daily with breakfast.       . nitroGLYCERIN (NITROSTAT) 0.3 MG SL tablet Place 1 tablet (0.3 mg total) under the tongue every 5 (five) minutes as needed. For chest pain  90 tablet  3  . pantoprazole (PROTONIX) 40 MG tablet TAKE 1 TABLET (40 MG TOTAL) BY MOUTH DAILY.  90 tablet  1  . repaglinide (PRANDIN) 2 MG tablet Take 1 tablet (2 mg total) by mouth 3 (three) times daily before meals.  90 tablet  11   No current facility-administered medications on file prior to visit.    Allergies  Allergen Reactions  . Codeine     Rash     Family History  Problem Relation Age of Onset  . Colon cancer Mother 80  . Breast cancer Mother   . Transient ischemic attack Mother   . Cancer Mother   . Hypertension Mother   . Stroke Mother   . Diabetes Mother   . Colon cancer Brother 61  . Cancer Brother   . Hypertension Brother   . Heart attack Brother   . Breast cancer Sister   . Cancer Sister   . Diabetes Sister   . Prostate cancer Brother   . Heart attack Brother   . Heart disease      3 brothers had MI; 1 pre 25  . Hypertension Sister   . Heart attack Daughter   . Diabetes Maternal Aunt     BP 132/82  Pulse 81  Temp(Src) 98.3 F (36.8 C) (Oral)  Ht 5\' 6"  (1.676 m)  Wt 182 lb (82.555 kg)  BMI 29.39 kg/m2  SpO2 93%  Review of Systems She denies hypoglycemia and weight change.      Objective:   Physical Exam VITAL SIGNS:  See vs page GENERAL: no distress SKIN: no ulcer on the feet  EXTEMITIES: no deformity. no ulcer on the feet. feet are of normal color and temp. Trace  bilat leg edema. There are bilat varicosities. there is a vein harvest scar on the right leg.  PULSES: dorsalis pedis intact bilat.  NEURO: sensation is intact to touch on the feet, but decreased from normal.    (i  discussed with Leonia Reader, CDE) Lab Results  Component Value Date   HGBA1C 8.2* 05/21/2014      Assessment & Plan:  DM: moderate exacerbation.  She needs increased rx.  Pt is referred to DM education.   Social situation.  Pt will need to live with dtr if she takes insulin.     Patient is advised the following: Patient Instructions  check your blood sugar twice a day.  vary the time of day when you check, between before the 3 meals, and at bedtime.  also check if you have symptoms of your blood sugar being too high or too low.  please keep a record of the readings and bring it to your next appointment here.  please call us sooner if your blood sugar goes below 70, or if you have a lot of readings over 200.   Please come back for a follow-up appointment in 3 months.   blood tests are being requested for you today.  We'll contact you with results.

## 2014-05-21 NOTE — Patient Instructions (Addendum)
check your blood sugar twice a day.  vary the time of day when you check, between before the 3 meals, and at bedtime.  also check if you have symptoms of your blood sugar being too high or too low.  please keep a record of the readings and bring it to your next appointment here.  please call us sooner if your blood sugar goes below 70, or if you have a lot of readings over 200.    Please come back for a follow-up appointment in 3 months.  blood tests are being requested for you today.  We'll contact you with results.     

## 2014-05-23 ENCOUNTER — Telehealth: Payer: Self-pay | Admitting: Endocrinology

## 2014-05-23 ENCOUNTER — Telehealth: Payer: Self-pay | Admitting: *Deleted

## 2014-05-23 NOTE — Telephone Encounter (Signed)
Documentation and signature is on file with [facility] but do not see why was rejected.  May need to re file paperwork with [employer].

## 2014-05-28 NOTE — Telephone Encounter (Signed)
There was missing information from Dr. Jodi Mourning so it took a little longer than normal to obtain the information needed. Patient can be scheduled for an appointment to be measured for diabetic shoes and insoles.

## 2014-05-29 NOTE — Telephone Encounter (Signed)
Scheduled on Friday 7.17.15 at 10am

## 2014-05-31 DIAGNOSIS — J040 Acute laryngitis: Secondary | ICD-10-CM | POA: Diagnosis not present

## 2014-06-05 ENCOUNTER — Encounter: Payer: Medicare Other | Admitting: Cardiology

## 2014-06-08 ENCOUNTER — Ambulatory Visit: Payer: Medicare Other

## 2014-06-15 ENCOUNTER — Ambulatory Visit (INDEPENDENT_AMBULATORY_CARE_PROVIDER_SITE_OTHER): Payer: Medicare Other | Admitting: *Deleted

## 2014-06-15 DIAGNOSIS — E1149 Type 2 diabetes mellitus with other diabetic neurological complication: Secondary | ICD-10-CM

## 2014-06-15 NOTE — Progress Notes (Signed)
   Subjective:    Patient ID: [name], female    DOB: [birth year], 80 y.o.   MRN: [id number]  HPI DIABETIC SHOE MEASUREMENT.   Review of Systems     Objective:   Physical Exam        Assessment & Plan:

## 2014-06-21 ENCOUNTER — Ambulatory Visit: Payer: Medicare Other | Admitting: *Deleted

## 2014-07-03 ENCOUNTER — Other Ambulatory Visit: Payer: Self-pay | Admitting: Internal Medicine

## 2014-07-11 ENCOUNTER — Other Ambulatory Visit: Payer: Self-pay | Admitting: Internal Medicine

## 2014-07-13 ENCOUNTER — Encounter: Payer: Self-pay | Admitting: Gastroenterology

## 2014-07-16 ENCOUNTER — Ambulatory Visit (INDEPENDENT_AMBULATORY_CARE_PROVIDER_SITE_OTHER): Payer: Medicare Other | Admitting: Internal Medicine

## 2014-07-16 ENCOUNTER — Encounter: Payer: Self-pay | Admitting: Internal Medicine

## 2014-07-16 VITALS — BP 143/72 | HR 68 | Ht 66.0 in | Wt 183.0 lb

## 2014-07-16 DIAGNOSIS — I2510 Atherosclerotic heart disease of native coronary artery without angina pectoris: Secondary | ICD-10-CM | POA: Diagnosis not present

## 2014-07-16 DIAGNOSIS — I495 Sick sinus syndrome: Secondary | ICD-10-CM

## 2014-07-16 DIAGNOSIS — I10 Essential (primary) hypertension: Secondary | ICD-10-CM

## 2014-07-16 LAB — MDC_IDC_ENUM_SESS_TYPE_INCLINIC
Battery Impedance: 159 Ohm
Battery Remaining Longevity: 133 mo
Brady Statistic AS VP Percent: 0 %
Brady Statistic AS VS Percent: 16 %
Lead Channel Impedance Value: 393 Ohm
Lead Channel Impedance Value: 738 Ohm
Lead Channel Pacing Threshold Amplitude: 0.75 V
Lead Channel Pacing Threshold Amplitude: 1 V
Lead Channel Pacing Threshold Pulse Width: 0.4 ms
Lead Channel Sensing Intrinsic Amplitude: 11.2 mV
Lead Channel Setting Pacing Amplitude: 2 V
Lead Channel Setting Sensing Sensitivity: 5.6 mV
MDC IDC MSMT BATTERY VOLTAGE: 2.78 V
MDC IDC MSMT LEADCHNL RV PACING THRESHOLD PULSEWIDTH: 0.4 ms
MDC IDC SESS DTM: 20150824165127
MDC IDC SET LEADCHNL RV PACING AMPLITUDE: 2.5 V
MDC IDC SET LEADCHNL RV PACING PULSEWIDTH: 0.4 ms
MDC IDC STAT BRADY AP VP PERCENT: 1 %
MDC IDC STAT BRADY AP VS PERCENT: 83 %

## 2014-07-16 MED ORDER — NITROGLYCERIN 0.3 MG SL SUBL
0.3000 mg | SUBLINGUAL_TABLET | SUBLINGUAL | Status: DC | PRN
Start: 2014-07-16 — End: 2018-01-10

## 2014-07-16 NOTE — Progress Notes (Signed)
PCP:  Unice Cobble, MD  The patient presents today for electrophysiology followup.  Since last being seen in our clinic, she has done reasonalby well.  She continues to have numerous noncardiac complaints.  She also has dizziness which she descries as due to head position changes.  She describes vertiginous spinning.  Today, she denies symptoms of palpitations, chest pain,  presyncope, syncope, or neurologic sequela.  + chronic edema The patient feels that she is tolerating medications without difficulties and is otherwise without complaint today.   Past Medical History  Diagnosis Date  . Sliding hiatal hernia   . Erosive esophagitis   . Peptic stricture of esophagus   . Diverticulosis   . Arthritis   . Hypothyroidism   . Diabetes mellitus     Type I  . Coronary artery disease     s/p CABG 2001  . Sick sinus syndrome     s/p Medtronic DDD pacemaker implanted; generator change 02/2012  . Hypertension   . Hyperlipidemia   . GERD (gastroesophageal reflux disease)   . Adenomatous colon polyp 07/2002  . CVA (cerebral vascular accident)     Dr Evelena Leyden, Neurology  . Obesity    Past Surgical History  Procedure Laterality Date  . Pacemaker placement  03/25/2000    by Dr Olevia Perches  . Tonsillectomy and adenoidectomy    . Appendectomy    . Cholecystectomy    . Total abdominal hysterectomy w/ bilateral salpingoophorectomy      Endometiosis  . Replacement total knee  2004    Right  . Colonoscopy w/ polypectomy  2003  . Cataract extraction  05/2005  . Coronary artery bypass graft  02/2000    5 vessel  . Rotator cuff repair    . Pacemaker generator replacement  03/2012    Dr. Rayann Heman    Current Outpatient Prescriptions  Medication Sig Dispense Refill  . acarbose (PRECOSE) 50 MG tablet Take 1 tablet (50 mg total) by mouth 3 (three) times daily with meals.  90 tablet  11  . amitriptyline (ELAVIL) 25 MG tablet Take 1 tablet (25 mg total) by mouth at bedtime.  90 tablet  2  . atorvastatin  (LIPITOR) 20 MG tablet TAKE 1 TABLET BY MOUTH EVERY DAY (IN PLACE OF PRAVASTATIN)  90 tablet  3  . bromocriptine (PARLODEL) 2.5 MG tablet Take 1 tablet (2.5 mg total) by mouth at bedtime.  30 tablet  11  . Calcium Carbonate (CALCIUM 500 PO) Take 500 mg by mouth daily.       . Canagliflozin (INVOKANA) 100 MG TABS Take 1 tablet (100 mg total) by mouth daily.  90 tablet  2  . clopidogrel (PLAVIX) 75 MG tablet TAKE 1 TABLET BY MOUTH DAILY  30 tablet  0  . colesevelam (WELCHOL) 625 MG tablet Take 3 tablets (1,875 mg total) by mouth 2 (two) times daily before a meal.  540 tablet  3  . donepezil (ARICEPT) 10 MG tablet Take 10 mg by mouth at bedtime.      . enalapril (VASOTEC) 20 MG tablet Take 1 tablet (20 mg total) by mouth daily.  30 tablet  1  . fish oil-omega-3 fatty acids 1000 MG capsule Take 1 g by mouth daily.      Marland Kitchen levothyroxine (SYNTHROID, LEVOTHROID) 25 MCG tablet 1 1/2 by mouth daily  135 tablet  3  . Memantine HCl ER (NAMENDA XR) 28 MG CP24 Take 28 mg by mouth daily.  90 capsule  3  . metoprolol (  TOPROL-XL) 200 MG 24 hr tablet TAKE 1 TABLET BY MOUTH EVERY DAY  30 tablet  0  . mirabegron ER (MYRBETRIQ) 25 MG TB24 tablet Take 1 tablet (25 mg total) by mouth daily.  90 tablet  1  . naproxen sodium (ANAPROX) 220 MG tablet Take 220 mg by mouth 2 (two) times daily as needed.      . niacin 500 MG tablet Take 500 mg by mouth daily with breakfast.       . nitroGLYCERIN (NITROSTAT) 0.3 MG SL tablet Place 1 tablet (0.3 mg total) under the tongue every 5 (five) minutes as needed. For chest pain  90 tablet  3  . pantoprazole (PROTONIX) 40 MG tablet TAKE 1 TABLET (40 MG TOTAL) BY MOUTH DAILY.  90 tablet  1  . repaglinide (PRANDIN) 2 MG tablet Take 1 tablet (2 mg total) by mouth 3 (three) times daily before meals.  90 tablet  11  . sitaGLIPtin (JANUVIA) 100 MG tablet Take 1 tablet (100 mg total) by mouth daily.  30 tablet  11   No current facility-administered medications for this visit.    Allergies   Allergen Reactions  . Codeine Rash    History   Social History  . Marital Status: Widowed    Spouse Name: N/A    Number of Children: 1  . Years of Education: N/A   Occupational History  . Retired    Social History Main Topics  . Smoking status: Former Research scientist (life sciences)  . Smokeless tobacco: Former Systems developer    Quit date: 11/23/1981     Comment: quit 28+ yrs ago (as of 2012)  . Alcohol Use: No  . Drug Use: No  . Sexual Activity: No   Other Topics Concern  . Not on file   Social History Narrative   Patient lives at home alone. Patient's daughter was with her [name] ) [phone]   Retired.   Education college   Right handed.   Caffeine one cup of coffee daily.   .    Family History  Problem Relation Age of Onset  . Colon cancer Mother 95  . Breast cancer Mother   . Transient ischemic attack Mother   . Cancer Mother   . Hypertension Mother   . Stroke Mother   . Diabetes Mother   . Colon cancer Brother 49  . Cancer Brother   . Hypertension Brother   . Heart attack Brother   . Breast cancer Sister   . Cancer Sister   . Diabetes Sister   . Prostate cancer Brother   . Heart attack Brother   . Heart disease      3 brothers had MI; 1 pre 42  . Hypertension Sister   . Heart attack Daughter   . Diabetes Maternal Aunt     Physical Exam: Filed Vitals:   07/16/14 1640  BP: 143/72  Pulse: 68  Height: 5\' 6"  (1.676 m)  Weight: 183 lb (83.008 kg)    GEN- The patient is elderly and overweight appearing, alert and oriented x 3 today.   Head- normocephalic, atraumatic Eyes-  Sclera clear, conjunctiva pink Ears- hearing intact Oropharynx- clear Neck- supple,  Lungs- Clear to ausculation bilaterally, normal work of breathing Chest- pacemaker pocket is well healed Heart- Regular rate and rhythm, no murmurs, rubs or gallops, PMI not laterally displaced GI- soft, NT, ND, + BS Extremities- no clubbing, cyanosis, +1 edema  Pacemaker interrogation- reviewed in detail  today,  See PACEART report  Assessment  and Plan:   1. Sick sinus syndrome Normal pacemaker function See Pace Art report No changes today  2. CAD No ischemic symptoms  3. HTN Stable No change required today  4. Vertiginous spinning/ BPV Will try meclizine as she has found this helpful in the past Follow-up with primary care  Compliant with remote monitoring Return to the device clinic in 1 year

## 2014-07-16 NOTE — Patient Instructions (Signed)
Your physician wants you to follow-up in: 12 months with Dr Rayann Heman.  You will receive a reminder letter in the mail two months in advance. If you don't receive a letter, please call our office to schedule the follow-up appointment.   Remote monitoring is used to monitor your Pacemaker or ICD from home. This monitoring reduces the number of office visits required to check your device to one time per year. It allows [place] to keep an eye on the functioning of your device to ensure it is working properly. You are scheduled for a device check from home on 10/17/14. You may send your transmission at any time that day. If you have a wireless device, the transmission will be sent automatically. After your physician reviews your transmission, you will receive a postcard with your next transmission date.

## 2014-07-17 ENCOUNTER — Encounter: Payer: Self-pay | Admitting: Internal Medicine

## 2014-07-30 ENCOUNTER — Other Ambulatory Visit: Payer: Self-pay | Admitting: Internal Medicine

## 2014-08-01 ENCOUNTER — Other Ambulatory Visit: Payer: Self-pay | Admitting: Internal Medicine

## 2014-08-03 ENCOUNTER — Other Ambulatory Visit: Payer: Self-pay | Admitting: Endocrinology

## 2014-08-10 ENCOUNTER — Other Ambulatory Visit: Payer: Self-pay | Admitting: Internal Medicine

## 2014-08-18 ENCOUNTER — Other Ambulatory Visit: Payer: Self-pay | Admitting: Internal Medicine

## 2014-08-20 DIAGNOSIS — Z961 Presence of intraocular lens: Secondary | ICD-10-CM | POA: Diagnosis not present

## 2014-08-20 DIAGNOSIS — E119 Type 2 diabetes mellitus without complications: Secondary | ICD-10-CM | POA: Diagnosis not present

## 2014-08-20 LAB — HM DIABETES EYE EXAM

## 2014-08-21 ENCOUNTER — Ambulatory Visit: Payer: Medicare Other | Admitting: Endocrinology

## 2014-08-27 ENCOUNTER — Encounter: Payer: Self-pay | Admitting: Endocrinology

## 2014-08-27 ENCOUNTER — Ambulatory Visit (INDEPENDENT_AMBULATORY_CARE_PROVIDER_SITE_OTHER): Payer: Medicare Other | Admitting: Endocrinology

## 2014-08-27 VITALS — BP 118/64 | HR 86 | Temp 97.9°F | Wt 199.0 lb

## 2014-08-27 DIAGNOSIS — I2510 Atherosclerotic heart disease of native coronary artery without angina pectoris: Secondary | ICD-10-CM | POA: Diagnosis not present

## 2014-08-27 DIAGNOSIS — N189 Chronic kidney disease, unspecified: Secondary | ICD-10-CM | POA: Diagnosis not present

## 2014-08-27 DIAGNOSIS — E1122 Type 2 diabetes mellitus with diabetic chronic kidney disease: Secondary | ICD-10-CM | POA: Diagnosis not present

## 2014-08-27 DIAGNOSIS — Z23 Encounter for immunization: Secondary | ICD-10-CM | POA: Diagnosis not present

## 2014-08-27 DIAGNOSIS — E785 Hyperlipidemia, unspecified: Secondary | ICD-10-CM | POA: Diagnosis not present

## 2014-08-27 NOTE — Progress Notes (Signed)
Subjective:    Patient ID: [name], female    DOB: 01-21-1925, 80 y.o.   MRN: [id number]  HPI Pt returns for f/u of diabetes mellitus: DM type: 2 Dx'ed: 6440 Complications: sensory neuropathy, CVA, renal insuff, and CAD Therapy: 5 orals GDM: never DKA: never Severe hypoglycemia: never Pancreatitis: never Other: she is not on metformin, due to CHF, she is not on actos, due to edema; she is off amaryl, due to renal insuff Interval history:  Pt says she'll take insulin if necessary.  she brings a record of her cbg's which i have reviewed today.  It varies from 79-223, but most are in the low-100's.  There is no trend throughout the day.  History is obtained from patient and dtr.  pt states she feels well in general, except for slight right thigh pain, and assoc numbness. Past Medical History  Diagnosis Date  . Sliding hiatal hernia   . Erosive esophagitis   . Peptic stricture of esophagus   . Diverticulosis   . Arthritis   . Hypothyroidism   . Diabetes mellitus     Type I  . Coronary artery disease     s/p CABG 2001  . Sick sinus syndrome     s/p Medtronic DDD pacemaker implanted; generator change 02/2012  . Hypertension   . Hyperlipidemia   . GERD (gastroesophageal reflux disease)   . Adenomatous colon polyp 07/2002  . CVA (cerebral vascular accident)     Dr Evelena Leyden, Neurology  . Obesity     Past Surgical History  Procedure Laterality Date  . Pacemaker placement  03/25/2000    by Dr Olevia Perches  . Tonsillectomy and adenoidectomy    . Appendectomy    . Cholecystectomy    . Total abdominal hysterectomy w/ bilateral salpingoophorectomy      Endometiosis  . Replacement total knee  2004    Right  . Colonoscopy w/ polypectomy  2003  . Cataract extraction  05/2005  . Coronary artery bypass graft  02/2000    5 vessel  . Rotator cuff repair    . Pacemaker generator replacement  03/2012    Dr. Rayann Heman    History   Social History  . Marital Status: Widowed    Spouse Name:  N/A    Number of Children: 1  . Years of Education: N/A   Occupational History  . Retired    Social History Main Topics  . Smoking status: Former Research scientist (life sciences)  . Smokeless tobacco: Former Systems developer    Quit date: 11/23/1981     Comment: quit 28+ yrs ago (as of 2012)  . Alcohol Use: No  . Drug Use: No  . Sexual Activity: No   Other Topics Concern  . Not on file   Social History Narrative   Patient lives at home alone. Patient's daughter was with her [name] ) [phone]   Retired.   Education college   Right handed.   Caffeine one cup of coffee daily.   .    Current Outpatient Prescriptions on File Prior to Visit  Medication Sig Dispense Refill  . amitriptyline (ELAVIL) 25 MG tablet Take 1 tablet (25 mg total) by mouth at bedtime.  90 tablet  2  . atorvastatin (LIPITOR) 20 MG tablet TAKE 1 TABLET BY MOUTH EVERY DAY (IN PLACE OF PRAVASTATIN)  90 tablet  3  . bromocriptine (PARLODEL) 2.5 MG tablet TAKE 1 TABLET (2.5 MG TOTAL) BY MOUTH AT BEDTIME.  30 tablet  10  .  Calcium Carbonate (CALCIUM 500 PO) Take 500 mg by mouth daily.       . Canagliflozin (INVOKANA) 100 MG TABS Take 1 tablet (100 mg total) by mouth daily.  90 tablet  2  . clopidogrel (PLAVIX) 75 MG tablet TAKE 1 TABLET BY MOUTH DAILY  30 tablet  9  . colesevelam (WELCHOL) 625 MG tablet Take 3 tablets (1,875 mg total) by mouth 2 (two) times daily before a meal.  540 tablet  3  . donepezil (ARICEPT) 10 MG tablet Take 10 mg by mouth at bedtime.      . enalapril (VASOTEC) 20 MG tablet Take 1 tablet (20 mg total) by mouth daily.  30 tablet  1  . fish oil-omega-3 fatty acids 1000 MG capsule Take 1 g by mouth daily.      Marland Kitchen levothyroxine (SYNTHROID, LEVOTHROID) 25 MCG tablet 1 1/2 by mouth daily  135 tablet  3  . Memantine HCl ER (NAMENDA XR) 28 MG CP24 Take 28 mg by mouth daily.  90 capsule  3  . metoprolol (TOPROL-XL) 200 MG 24 hr tablet TAKE 1 TABLET BY MOUTH EVERY DAY  30 tablet  5  . MYRBETRIQ 25 MG TB24 tablet TAKE 1 TABLET  (25 MG TOTAL) BY MOUTH DAILY.  90 tablet  1  . naproxen sodium (ANAPROX) 220 MG tablet Take 220 mg by mouth 2 (two) times daily as needed.      . niacin 500 MG tablet Take 500 mg by mouth daily with breakfast.       . nitroGLYCERIN (NITROSTAT) 0.3 MG SL tablet Place 1 tablet (0.3 mg total) under the tongue every 5 (five) minutes as needed. For chest pain  90 tablet  3  . pantoprazole (PROTONIX) 40 MG tablet TAKE 1 TABLET (40 MG TOTAL) BY MOUTH DAILY.  90 tablet  1  . repaglinide (PRANDIN) 2 MG tablet Take 1 tablet (2 mg total) by mouth 3 (three) times daily before meals.  90 tablet  11  . sitaGLIPtin (JANUVIA) 100 MG tablet Take 1 tablet (100 mg total) by mouth daily.  30 tablet  11   No current facility-administered medications on file prior to visit.    Allergies  Allergen Reactions  . Codeine Rash    Family History  Problem Relation Age of Onset  . Colon cancer Mother 52  . Breast cancer Mother   . Transient ischemic attack Mother   . Cancer Mother   . Hypertension Mother   . Stroke Mother   . Diabetes Mother   . Colon cancer Brother 32  . Cancer Brother   . Hypertension Brother   . Heart attack Brother   . Breast cancer Sister   . Cancer Sister   . Diabetes Sister   . Prostate cancer Brother   . Heart attack Brother   . Heart disease      3 brothers had MI; 1 pre 72  . Hypertension Sister   . Heart attack Daughter   . Diabetes Maternal Aunt     BP 118/64  Pulse 86  Temp(Src) 97.9 F (36.6 C) (Oral)  Wt 199 lb (90.266 kg)  SpO2 95%    Review of Systems She denies hypoglycemia.  She has weight gain.      Objective:   Physical Exam VITAL SIGNS:  See vs page GENERAL: no distress SKIN: no ulcer on the feet.  feet are of normal color and temp EXTEMITIES: no deformity. no ulcer on the feet. . Trace bilat  leg edema. There are bilat varicosities. there is a vein harvest scar on the right leg.  PULSES: dorsalis pedis intact bilat.  NEURO: sensation is intact to  touch on the feet, but decreased from normal.      Lab Results  Component Value Date   HGBA1C 6.6* 08/27/2014   Lab Results  Component Value Date   CREATININE 1.4* 08/27/2014   BUN 22 08/27/2014   NA 137 08/27/2014   K 4.8 08/27/2014   CL 106 08/27/2014   CO2 24 08/27/2014        Assessment & Plan:  DM: well-controlled Renal insufficiency, stable Thigh pain, new: this limits exercise rx of DM, and risk of falls.    Patient is advised the following: Patient Instructions  check your blood sugar twice a day.  vary the time of day when you check, between before the 3 meals, and at bedtime.  also check if you have symptoms of your blood sugar being too high or too low.  please keep a record of the readings and bring it to your next appointment here.  please call us sooner if your blood sugar goes below 70, or if you have a lot of readings over 200.   Please come back for a follow-up appointment in 3 months.   blood tests are being requested for you today.  We'll contact you with results.   If necessary, we can increase the invokana and/or bromocriptine.   Please do the best you can with exercise, but please be careful to not fall.  Also, beware of traffic.

## 2014-08-27 NOTE — Patient Instructions (Addendum)
check your blood sugar twice a day.  vary the time of day when you check, between before the 3 meals, and at bedtime.  also check if you have symptoms of your blood sugar being too high or too low.  please keep a record of the readings and bring it to your next appointment here.  please call us sooner if your blood sugar goes below 70, or if you have a lot of readings over 200.   Please come back for a follow-up appointment in 3 months.   blood tests are being requested for you today.  We'll contact you with results.   If necessary, we can increase the invokana and/or bromocriptine.   Please do the best you can with exercise, but please be careful to not fall.  Also, beware of traffic.

## 2014-08-28 LAB — BASIC METABOLIC PANEL
BUN: 22 mg/dL (ref 6–23)
CO2: 24 mEq/L (ref 19–32)
Calcium: 9.5 mg/dL (ref 8.4–10.5)
Chloride: 106 mEq/L (ref 96–112)
Creatinine, Ser: 1.4 mg/dL — ABNORMAL HIGH (ref 0.4–1.2)
GFR: 37.51 mL/min — AB (ref >60.00)
Glucose, Bld: 122 mg/dL — ABNORMAL HIGH (ref 70–99)
Potassium: 4.8 mEq/L (ref 3.5–5.1)
SODIUM: 137 meq/L (ref 135–145)

## 2014-08-28 LAB — LIPID PANEL
CHOLESTEROL: 158 mg/dL (ref 0–200)
HDL: 46.5 mg/dL (ref >39.00)
NonHDL: 111.5
TRIGLYCERIDES: 274 mg/dL — AB (ref 0.0–149.0)
Total CHOL/HDL Ratio: 3
VLDL: 54.8 mg/dL — AB (ref 0.0–40.0)

## 2014-08-28 LAB — MICROALBUMIN / CREATININE URINE RATIO
CREATININE, U: 31.1 mg/dL
Microalb Creat Ratio: 11.9 mg/g (ref 0.0–30.0)
Microalb, Ur: 3.7 mg/dL — ABNORMAL HIGH (ref 0.0–1.9)

## 2014-08-28 LAB — LDL CHOLESTEROL, DIRECT: Direct LDL: 69.4 mg/dL

## 2014-08-28 LAB — HEMOGLOBIN A1C: HEMOGLOBIN A1C: 6.6 % — AB (ref 4.6–6.5)

## 2014-09-06 ENCOUNTER — Encounter: Payer: Self-pay | Admitting: Internal Medicine

## 2014-09-13 ENCOUNTER — Ambulatory Visit: Payer: Medicare Other | Admitting: Podiatrist

## 2014-09-14 ENCOUNTER — Ambulatory Visit: Payer: Medicare Other | Admitting: Podiatrist

## 2014-09-21 ENCOUNTER — Ambulatory Visit: Payer: Medicare Other | Admitting: Podiatrist

## 2014-09-27 ENCOUNTER — Ambulatory Visit (INDEPENDENT_AMBULATORY_CARE_PROVIDER_SITE_OTHER): Payer: Medicare Other | Admitting: Podiatrist

## 2014-09-27 ENCOUNTER — Encounter: Payer: Self-pay | Admitting: Podiatrist

## 2014-09-27 DIAGNOSIS — B351 Tinea unguium: Secondary | ICD-10-CM | POA: Diagnosis not present

## 2014-09-27 DIAGNOSIS — M79676 Pain in unspecified toe(s): Secondary | ICD-10-CM | POA: Diagnosis not present

## 2014-09-27 NOTE — Patient Instructions (Signed)

## 2014-10-02 NOTE — Progress Notes (Signed)
HPI:  Patient presents today for follow up of foot and nail care. Denies any new complaints today.  Objective:  Patients chart is reviewed.  Vascular status reveals pedal pulses noted at 2 out of 4 dp and pt bilateral .  Neurological sensation is impaired to [employer] monofilament bilateral.  Patients nails are thickened, discolored, distrophic, friable and brittle with yellow-brown discoloration. Patient subjectively relates they are painful with shoes and with ambulation of bilateral feet.  Assessment:  Symptomatic onychomycosis  Plan:  Discussed treatment options and alternatives.  The symptomatic toenails were debrided through manual an mechanical means without complication.  Return appointment recommended at routine intervals of 3 months  patient also presents to her diabetic shoes. I feel there are 2 small as the toes are hitting the end of the shoe. We are sending him back during her half size larger. She was happy with the shoe however I feel it would break down too quickly and caused too much pressure on her toes.  Athziry Millican, DPM   

## 2014-10-12 ENCOUNTER — Encounter: Payer: Self-pay | Admitting: Nurse Practitioner

## 2014-10-17 ENCOUNTER — Telehealth: Payer: Self-pay | Admitting: Cardiology

## 2014-10-17 ENCOUNTER — Ambulatory Visit (INDEPENDENT_AMBULATORY_CARE_PROVIDER_SITE_OTHER): Payer: Medicare Other | Admitting: *Deleted

## 2014-10-17 DIAGNOSIS — I495 Sick sinus syndrome: Secondary | ICD-10-CM

## 2014-10-17 NOTE — Telephone Encounter (Signed)
Confirmed remote transmission w/ pt daughter.   

## 2014-10-17 NOTE — Progress Notes (Signed)
Remote pacemaker transmission.   

## 2014-10-20 LAB — MDC_IDC_ENUM_SESS_TYPE_REMOTE
Battery Impedance: 207 Ohm
Battery Remaining Longevity: 123 mo
Brady Statistic AP VP Percent: 0 %
Brady Statistic AS VP Percent: 0 %
Date Time Interrogation Session: 20151125160406
Lead Channel Impedance Value: 403 Ohm
Lead Channel Impedance Value: 801 Ohm
Lead Channel Pacing Threshold Amplitude: 0.625 V
Lead Channel Setting Pacing Amplitude: 2 V
Lead Channel Setting Pacing Amplitude: 2.75 V
Lead Channel Setting Sensing Sensitivity: 5.6 mV
MDC IDC MSMT BATTERY VOLTAGE: 2.78 V
MDC IDC MSMT LEADCHNL RA PACING THRESHOLD PULSEWIDTH: 0.4 ms
MDC IDC MSMT LEADCHNL RV PACING THRESHOLD AMPLITUDE: 1.375 V
MDC IDC MSMT LEADCHNL RV PACING THRESHOLD PULSEWIDTH: 0.4 ms
MDC IDC MSMT LEADCHNL RV SENSING INTR AMPL: 11.2 mV
MDC IDC SET LEADCHNL RV PACING PULSEWIDTH: 0.4 ms
MDC IDC STAT BRADY AP VS PERCENT: 94 %
MDC IDC STAT BRADY AS VS PERCENT: 6 %

## 2014-10-27 ENCOUNTER — Other Ambulatory Visit: Payer: Self-pay | Admitting: Internal Medicine

## 2014-10-30 ENCOUNTER — Encounter: Payer: Self-pay | Admitting: Cardiology

## 2014-11-01 ENCOUNTER — Other Ambulatory Visit: Payer: Self-pay | Admitting: Podiatrist

## 2014-11-01 ENCOUNTER — Encounter (HOSPITAL_COMMUNITY): Payer: Self-pay | Admitting: Internal Medicine

## 2014-11-07 ENCOUNTER — Encounter: Payer: Self-pay | Admitting: Internal Medicine

## 2014-11-09 ENCOUNTER — Ambulatory Visit (INDEPENDENT_AMBULATORY_CARE_PROVIDER_SITE_OTHER): Payer: Medicare Other

## 2014-11-09 DIAGNOSIS — E1122 Type 2 diabetes mellitus with diabetic chronic kidney disease: Secondary | ICD-10-CM | POA: Diagnosis not present

## 2014-11-09 DIAGNOSIS — M2060 Acquired deformities of toe(s), unspecified, unspecified foot: Secondary | ICD-10-CM

## 2014-11-09 NOTE — Patient Instructions (Signed)

## 2014-11-09 NOTE — Progress Notes (Signed)
Pt is here to PUDS

## 2014-11-13 ENCOUNTER — Other Ambulatory Visit: Payer: Self-pay | Admitting: *Deleted

## 2014-11-13 ENCOUNTER — Encounter: Payer: Self-pay | Admitting: Cardiology

## 2014-11-13 NOTE — Telephone Encounter (Signed)
Dr. Cala Bradford a refill request for Amitriptyline HCL 25 mg tab plus 1 additional refill.

## 2014-11-23 ENCOUNTER — Other Ambulatory Visit: Payer: Self-pay | Admitting: Endocrinology

## 2014-11-30 ENCOUNTER — Ambulatory Visit: Payer: Medicare Other | Admitting: Endocrinology

## 2014-12-04 ENCOUNTER — Telehealth: Payer: Self-pay | Admitting: Endocrinology

## 2014-12-04 ENCOUNTER — Telehealth: Payer: Self-pay

## 2014-12-04 ENCOUNTER — Telehealth: Payer: Self-pay | Admitting: *Deleted

## 2014-12-04 NOTE — Telephone Encounter (Signed)
Error

## 2014-12-04 NOTE — Telephone Encounter (Signed)
Per Dr. Valentina Lucks I faxed an order to [facility] to discontinue patient's Tricyclic Antidepressant (TCA) and speak to primary care doctor. She received a notice that TCA is a high risk drug for Alzheimer's Disease patients.

## 2014-12-06 NOTE — Telephone Encounter (Signed)
error 

## 2014-12-24 ENCOUNTER — Other Ambulatory Visit: Payer: Self-pay

## 2014-12-24 ENCOUNTER — Encounter: Payer: Self-pay | Admitting: Endocrinology

## 2014-12-24 ENCOUNTER — Ambulatory Visit (INDEPENDENT_AMBULATORY_CARE_PROVIDER_SITE_OTHER): Payer: Medicare Other | Admitting: Endocrinology

## 2014-12-24 VITALS — BP 134/70 | HR 84 | Temp 98.2°F | Ht 66.0 in | Wt 202.0 lb

## 2014-12-24 DIAGNOSIS — E039 Hypothyroidism, unspecified: Secondary | ICD-10-CM

## 2014-12-24 DIAGNOSIS — N189 Chronic kidney disease, unspecified: Secondary | ICD-10-CM | POA: Diagnosis not present

## 2014-12-24 DIAGNOSIS — E1122 Type 2 diabetes mellitus with diabetic chronic kidney disease: Secondary | ICD-10-CM | POA: Diagnosis not present

## 2014-12-24 DIAGNOSIS — E785 Hyperlipidemia, unspecified: Secondary | ICD-10-CM

## 2014-12-24 LAB — BASIC METABOLIC PANEL
BUN: 24 mg/dL — ABNORMAL HIGH (ref 6–23)
CO2: 23 mEq/L (ref 19–32)
Calcium: 9.8 mg/dL (ref 8.4–10.5)
Chloride: 105 mEq/L (ref 96–112)
Creatinine, Ser: 1.5 mg/dL — ABNORMAL HIGH (ref 0.40–1.20)
GFR: 34.9 mL/min — AB (ref >60.00)
Glucose, Bld: 128 mg/dL — ABNORMAL HIGH (ref 70–99)
POTASSIUM: 4.3 meq/L (ref 3.5–5.1)
Sodium: 137 mEq/L (ref 135–145)

## 2014-12-24 LAB — TSH: TSH: 4.34 u[IU]/mL (ref 0.35–4.50)

## 2014-12-24 LAB — HEMOGLOBIN A1C: Hgb A1c MFr Bld: 7.1 % — ABNORMAL HIGH (ref 4.6–6.5)

## 2014-12-24 MED ORDER — ATORVASTATIN CALCIUM 20 MG PO TABS: ORAL_TABLET | ORAL | Status: DC

## 2014-12-24 NOTE — Progress Notes (Signed)
Subjective:    Patient ID: [name], female    DOB: 10/17/1925, 81 y.o.   MRN: [id number]  HPI Pt returns for f/u of diabetes mellitus: DM type: 2 Dx'ed: 7253 Complications: sensory neuropathy, CVA, renal insuff, and CAD. Therapy: 5 oral meds GDM: never DKA: never Severe hypoglycemia: never Pancreatitis: never Other: she is not on metformin, due to CHF, she is not on actos, due to edema; she is off amaryl, due to renal insuff.  Pt says she'll take insulin if necessary Interval history:  History is obtained from patient and dtr.  she brings a record of her cbg's which i have reviewed today.  It varies from 90-140.  There is no trend throughout the day.  Past Medical History  Diagnosis Date  . Sliding hiatal hernia   . Erosive esophagitis   . Peptic stricture of esophagus   . Diverticulosis   . Arthritis   . Hypothyroidism   . Diabetes mellitus     Type I  . Coronary artery disease     s/p CABG 2001  . Sick sinus syndrome     s/p Medtronic DDD pacemaker implanted; generator change 02/2012  . Hypertension   . Hyperlipidemia   . GERD (gastroesophageal reflux disease)   . Adenomatous colon polyp 07/2002  . CVA (cerebral vascular accident)     Dr Evelena Leyden, Neurology  . Obesity     Past Surgical History  Procedure Laterality Date  . Pacemaker placement  03/25/2000    by Dr Olevia Perches  . Tonsillectomy and adenoidectomy    . Appendectomy    . Cholecystectomy    . Total abdominal hysterectomy w/ bilateral salpingoophorectomy      Endometiosis  . Replacement total knee  2004    Right  . Colonoscopy w/ polypectomy  2003  . Cataract extraction  05/2005  . Coronary artery bypass graft  02/2000    5 vessel  . Rotator cuff repair    . Pacemaker generator replacement  03/2012    Dr. Rayann Heman  . Permanent pacemaker generator change  03/18/2012    Procedure: PERMANENT PACEMAKER GENERATOR CHANGE;  Surgeon: Thompson Grayer, MD;  Location: [facility] CATH LAB;  Service: Cardiovascular;;    History     Social History  . Marital Status: Widowed    Spouse Name: N/A    Number of Children: 1  . Years of Education: N/A   Occupational History  . Retired    Social History Main Topics  . Smoking status: Former Research scientist (life sciences)  . Smokeless tobacco: Former Systems developer    Quit date: 11/23/1981     Comment: quit 28+ yrs ago (as of 2012)  . Alcohol Use: No  . Drug Use: No  . Sexual Activity: No   Other Topics Concern  . Not on file   Social History Narrative   Patient lives at home alone. Patient's daughter was with her [name] ) [phone]   Retired.   Education college   Right handed.   Caffeine one cup of coffee daily.   .    Current Outpatient Prescriptions on File Prior to Visit  Medication Sig Dispense Refill  . amitriptyline (ELAVIL) 25 MG tablet TAKE 1 TABLET (25 MG TOTAL) BY MOUTH AT BEDTIME. 90 tablet 2  . bromocriptine (PARLODEL) 2.5 MG tablet TAKE 1 TABLET (2.5 MG TOTAL) BY MOUTH AT BEDTIME. 30 tablet 10  . Calcium Carbonate (CALCIUM 500 PO) Take 500 mg by mouth daily.     . clopidogrel (PLAVIX)  75 MG tablet TAKE 1 TABLET BY MOUTH DAILY 30 tablet 9  . colesevelam (WELCHOL) 625 MG tablet Take 3 tablets (1,875 mg total) by mouth 2 (two) times daily before a meal. 540 tablet 3  . donepezil (ARICEPT) 10 MG tablet Take 10 mg by mouth at bedtime.    . enalapril (VASOTEC) 20 MG tablet Take 1 tablet (20 mg total) by mouth daily. 30 tablet 1  . fish oil-omega-3 fatty acids 1000 MG capsule Take 1 g by mouth daily.    . INVOKANA 100 MG TABS tablet TAKE 1 TABLET BY MOUTH EVERY DAY 90 tablet 2  . levothyroxine (SYNTHROID, LEVOTHROID) 25 MCG tablet 1 1/2 by mouth daily 135 tablet 3  . Memantine HCl ER (NAMENDA XR) 28 MG CP24 Take 28 mg by mouth daily. 90 capsule 3  . metoprolol (TOPROL-XL) 200 MG 24 hr tablet TAKE 1 TABLET BY MOUTH EVERY DAY 30 tablet 5  . MYRBETRIQ 25 MG TB24 tablet TAKE 1 TABLET (25 MG TOTAL) BY MOUTH DAILY. 90 tablet 1  . naproxen sodium (ANAPROX) 220 MG tablet Take  220 mg by mouth 2 (two) times daily as needed.    . niacin 500 MG tablet Take 500 mg by mouth daily with breakfast.     . nitroGLYCERIN (NITROSTAT) 0.3 MG SL tablet Place 1 tablet (0.3 mg total) under the tongue every 5 (five) minutes as needed. For chest pain 90 tablet 3  . pantoprazole (PROTONIX) 40 MG tablet TAKE 1 TABLET BY MOUTH ONCE DAILY 90 tablet 1  . repaglinide (PRANDIN) 2 MG tablet Take 1 tablet (2 mg total) by mouth 3 (three) times daily before meals. 90 tablet 11  . sitaGLIPtin (JANUVIA) 100 MG tablet Take 1 tablet (100 mg total) by mouth daily. 30 tablet 11   No current facility-administered medications on file prior to visit.    Allergies  Allergen Reactions  . Codeine Rash    Family History  Problem Relation Age of Onset  . Colon cancer Mother 60  . Breast cancer Mother   . Transient ischemic attack Mother   . Cancer Mother   . Hypertension Mother   . Stroke Mother   . Diabetes Mother   . Colon cancer Brother 61  . Cancer Brother   . Hypertension Brother   . Heart attack Brother   . Breast cancer Sister   . Cancer Sister   . Diabetes Sister   . Prostate cancer Brother   . Heart attack Brother   . Heart disease      3 brothers had MI; 1 pre 65  . Hypertension Sister   . Heart attack Daughter   . Diabetes Maternal Aunt     BP 134/70 mmHg  Pulse 84  Temp(Src) 98.2 F (36.8 C) (Oral)  Ht 5\' 6"  (1.676 m)  Wt 202 lb (91.627 kg)  BMI 32.62 kg/m2  SpO2 92%    Review of Systems She denies hypoglycemia and weight change.    Objective:   Physical Exam VITAL SIGNS:  See vs page GENERAL: no distress SKIN: no ulcer on the feet. feet are of normal color and temp  EXTEMITIES: no deformity. no ulcer on the feet. . Trace bilat leg edema. There are bilat varicosities. there is a vein harvest scar on the right leg.  PULSES: dorsalis pedis intact bilat.  NEURO: sensation is intact to touch on the feet, but decreased from normal.   Lab Results  Component  Value Date   HGBA1C 7.1*  12/24/2014   Lab Results  Component Value Date   CREATININE 1.50* 12/24/2014   BUN 24* 12/24/2014   NA 137 12/24/2014   K 4.3 12/24/2014   CL 105 12/24/2014   CO2 23 12/24/2014        Assessment & Plan:  Renal failure: stable.  We'll follow.  DM: well-controlled Frail elderly state: in this setting, this DM control is adequate.    Patient is advised the following: Patient Instructions  check your blood sugar once a day.  vary the time of day when you check, between before the 3 meals, and at bedtime.  also check if you have symptoms of your blood sugar being too high or too low.  please keep a record of the readings and bring it to your next appointment here.  please call us sooner if your blood sugar goes below 70, or if you have a lot of readings over 200.   Please come back for a follow-up appointment in 4 months.    blood tests are being requested for you today.  We'll contact you with results.   If necessary, we can increase the invokana and/or bromocriptine.     Please continue the same medications.

## 2014-12-24 NOTE — Patient Instructions (Addendum)
check your blood sugar once a day.  vary the time of day when you check, between before the 3 meals, and at bedtime.  also check if you have symptoms of your blood sugar being too high or too low.  please keep a record of the readings and bring it to your next appointment here.  please call us sooner if your blood sugar goes below 70, or if you have a lot of readings over 200.   Please come back for a follow-up appointment in 4 months.    blood tests are being requested for you today.  We'll contact you with results.   If necessary, we can increase the invokana and/or bromocriptine.

## 2014-12-27 ENCOUNTER — Ambulatory Visit: Payer: Medicare Other | Admitting: Podiatrist

## 2015-01-10 ENCOUNTER — Encounter: Payer: Self-pay | Admitting: Podiatrist

## 2015-01-10 ENCOUNTER — Ambulatory Visit (INDEPENDENT_AMBULATORY_CARE_PROVIDER_SITE_OTHER): Payer: Medicare Other | Admitting: Podiatrist

## 2015-01-10 DIAGNOSIS — B351 Tinea unguium: Secondary | ICD-10-CM

## 2015-01-10 DIAGNOSIS — N189 Chronic kidney disease, unspecified: Secondary | ICD-10-CM

## 2015-01-10 DIAGNOSIS — E1122 Type 2 diabetes mellitus with diabetic chronic kidney disease: Secondary | ICD-10-CM

## 2015-01-10 DIAGNOSIS — M79676 Pain in unspecified toe(s): Secondary | ICD-10-CM

## 2015-01-13 NOTE — Progress Notes (Signed)
HPI:  Patient presents today for follow up of foot and nail care. Denies any new complaints today.  Objective:  Patients chart is reviewed.  Vascular status reveals pedal pulses noted at 2 out of 4 dp and pt bilateral .  Neurological sensation is impaired to [employer] monofilament bilateral.  Patients nails are thickened, discolored, distrophic, friable and brittle with yellow-brown discoloration. Patient subjectively relates they are painful with shoes and with ambulation of bilateral feet.  Assessment:  Symptomatic onychomycosis  Plan:  Discussed treatment options and alternatives.  The symptomatic toenails were debrided through manual an mechanical means without complication.  Return appointment recommended at routine intervals of 3 months  patient also presents to her diabetic shoes. I feel there are 2 small as the toes are hitting the end of the shoe. We are sending him back during her half size larger. She was happy with the shoe however I feel it would break down too quickly and caused too much pressure on her toes.  Trudie Buckler, DPM

## 2015-01-16 ENCOUNTER — Other Ambulatory Visit: Payer: Self-pay | Admitting: Internal Medicine

## 2015-01-16 ENCOUNTER — Other Ambulatory Visit: Payer: Self-pay | Admitting: Endocrinology

## 2015-01-18 ENCOUNTER — Telehealth: Payer: Self-pay | Admitting: Endocrinology

## 2015-01-18 MED ORDER — COLESEVELAM HCL 625 MG PO TABS: 1875.0000 mg | ORAL_TABLET | Freq: Two times a day (BID) | ORAL | Status: DC

## 2015-01-18 NOTE — Telephone Encounter (Signed)
Rx sent to pharmacy   

## 2015-01-18 NOTE — Telephone Encounter (Signed)
Patients daughter called stating that [name] welchol has not been refilled    PLease refill as soon as you can     Thank you

## 2015-01-21 ENCOUNTER — Ambulatory Visit (INDEPENDENT_AMBULATORY_CARE_PROVIDER_SITE_OTHER): Payer: Medicare Other | Admitting: *Deleted

## 2015-01-21 ENCOUNTER — Encounter: Payer: Medicare Other | Admitting: *Deleted

## 2015-01-21 ENCOUNTER — Telehealth: Payer: Self-pay | Admitting: Cardiology

## 2015-01-21 DIAGNOSIS — I495 Sick sinus syndrome: Secondary | ICD-10-CM | POA: Diagnosis not present

## 2015-01-21 LAB — MDC_IDC_ENUM_SESS_TYPE_INCLINIC
Battery Impedance: 183 Ohm
Battery Remaining Longevity: 121 mo
Battery Voltage: 2.78 V
Brady Statistic AP VS Percent: 93 %
Brady Statistic AS VP Percent: 0 %
Brady Statistic AS VS Percent: 6 %
Lead Channel Pacing Threshold Amplitude: 0.75 V
Lead Channel Pacing Threshold Amplitude: 1.25 V
Lead Channel Pacing Threshold Pulse Width: 0.4 ms
Lead Channel Pacing Threshold Pulse Width: 0.4 ms
Lead Channel Setting Pacing Amplitude: 2 V
Lead Channel Setting Pacing Amplitude: 2.5 V
Lead Channel Setting Pacing Pulse Width: 0.4 ms
Lead Channel Setting Sensing Sensitivity: 5.6 mV
MDC IDC MSMT LEADCHNL RA IMPEDANCE VALUE: 404 Ohm
MDC IDC MSMT LEADCHNL RV IMPEDANCE VALUE: 843 Ohm
MDC IDC MSMT LEADCHNL RV SENSING INTR AMPL: 15.67 mV
MDC IDC SESS DTM: 20160229114739
MDC IDC STAT BRADY AP VP PERCENT: 0 %

## 2015-01-21 NOTE — Telephone Encounter (Signed)
Pt came to office to have device checked.

## 2015-01-21 NOTE — Progress Notes (Signed)
Pacemaker check in clinic. Normal device function. Thresholds, sensing, impedances consistent with previous measurements. Device programmed to maximize longevity. 2 mode switches--both less than 1 minute. No high ventricular rates noted. Device programmed at appropriate safety margins. Histogram distribution appropriate for patient activity level. Device programmed to optimize intrinsic conduction. Estimated longevity 10 years. Patient enrolled in remote follow-up with [facility]. Return kit ordered for pt to mail old monitor back to [facility]. [facility] 04-23-15 and ROV in August with [facility].

## 2015-01-22 ENCOUNTER — Encounter: Payer: Self-pay | Admitting: Cardiology

## 2015-01-24 DIAGNOSIS — Z803 Family history of malignant neoplasm of breast: Secondary | ICD-10-CM | POA: Diagnosis not present

## 2015-01-24 DIAGNOSIS — Z1231 Encounter for screening mammogram for malignant neoplasm of breast: Secondary | ICD-10-CM | POA: Diagnosis not present

## 2015-02-01 ENCOUNTER — Other Ambulatory Visit: Payer: Self-pay | Admitting: Internal Medicine

## 2015-02-02 ENCOUNTER — Other Ambulatory Visit: Payer: Self-pay | Admitting: Internal Medicine

## 2015-02-03 ENCOUNTER — Other Ambulatory Visit: Payer: Self-pay | Admitting: Internal Medicine

## 2015-02-04 DIAGNOSIS — I480 Paroxysmal atrial fibrillation: Secondary | ICD-10-CM

## 2015-02-04 HISTORY — DX: Paroxysmal atrial fibrillation: I48.0

## 2015-02-07 ENCOUNTER — Encounter: Payer: Self-pay | Admitting: Internal Medicine

## 2015-02-08 ENCOUNTER — Encounter: Payer: Self-pay | Admitting: Internal Medicine

## 2015-02-09 ENCOUNTER — Other Ambulatory Visit: Payer: Self-pay | Admitting: Endocrinology

## 2015-02-11 NOTE — Telephone Encounter (Signed)
Please advise if ok to refill. Medication is not on current list.  thanks!

## 2015-02-21 ENCOUNTER — Other Ambulatory Visit: Payer: Self-pay | Admitting: Internal Medicine

## 2015-02-21 NOTE — Telephone Encounter (Signed)
error 

## 2015-03-08 DIAGNOSIS — N3941 Urge incontinence: Secondary | ICD-10-CM | POA: Diagnosis not present

## 2015-03-08 DIAGNOSIS — N390 Urinary tract infection, site not specified: Secondary | ICD-10-CM | POA: Diagnosis not present

## 2015-03-08 DIAGNOSIS — R339 Retention of urine, unspecified: Secondary | ICD-10-CM | POA: Diagnosis not present

## 2015-03-21 ENCOUNTER — Ambulatory Visit (INDEPENDENT_AMBULATORY_CARE_PROVIDER_SITE_OTHER): Payer: Medicare Other | Admitting: Internal Medicine

## 2015-03-21 ENCOUNTER — Other Ambulatory Visit: Payer: Self-pay | Admitting: Internal Medicine

## 2015-03-21 ENCOUNTER — Encounter: Payer: Self-pay | Admitting: Internal Medicine

## 2015-03-21 VITALS — BP 110/74 | HR 65 | Temp 97.8°F | Ht 66.0 in | Wt 206.2 lb

## 2015-03-21 DIAGNOSIS — M4806 Spinal stenosis, lumbar region: Secondary | ICD-10-CM

## 2015-03-21 DIAGNOSIS — M48062 Spinal stenosis, lumbar region with neurogenic claudication: Secondary | ICD-10-CM

## 2015-03-21 DIAGNOSIS — M170 Bilateral primary osteoarthritis of knee: Secondary | ICD-10-CM

## 2015-03-21 NOTE — Patient Instructions (Signed)
Please do the mental exercise we discussed over 7 days and spiritual consideration for 24 hours. Whatever you decide will be the correct answer.  The Physical Therapy referral will be scheduled and you'll be notified of the time.Please call the Referral Co-Ordinator @ [phone] if you have not been notified of appointment time within 7 days.

## 2015-03-21 NOTE — Progress Notes (Signed)
   Subjective:    Patient ID: [name], female    DOB: 24-Nov-1924, 81 y.o.   MRN: [id number]  HPI  On 03/19/15 she was walking in her cul-de-sac and had  returned halfway when she fell.  She began to experience burning in the low back and in the thighs. The symptoms were greater in the right leg than the left. She landed on her buttocks. Her head did hit the frame of a  mailbox but there was no significant injury. She has some exertional dyspnea; but she had no cardiac or neurologic prodrome prior to the fall.   Incontinence is a chronic issue unrelated to the fall.  She describes himself as a "shut in". She lives by herself but has a life alert system. Her daughter lives 8 minutes away.   She has no past history of surgery to the back but has been evaluated extensively by an orthopedist   Review of Systems  Denied were any change in heart rhythm or rate prior to the event. There was no associated chest pain or shortness of breath .  Also specifically denied prior to the episode were headache, limb weakness, tingling, or numbness. No seizure activity noted.     Objective:   Physical Exam  Pertinent or positive findings include: She has a symmetric ptosis, greater on the left the right. Heart sounds are distant.  There is accentuated curvature of the upper thoracic spine. The paraspinous muscles are slightly asymmetric suggesting possible subclinical scoliosis.  There is marked fusiform changes in the knees. There is marked decreased range of motion of the knees particularly on the left.  She has lipedema at the ankles.  She has mild DIP changes in the hands.  Pedal pulses are decreased.  Her gait is markedly unstable exhibiting short choppy steps.  General appearance :adequately nourished; in no distress. BMI:33.31 Eyes: No conjunctival inflammation or scleral icterus is present. Heart:  Normal rate and regular rhythm. S1 and S2 normal without gallop, murmur, click, rub or  other extra sounds   Lungs:Chest clear to auscultation; no wheezes, rhonchi,rales ,or rubs present.No increased work of breathing.  Abdomen: Protuberant;bowel sounds normal, soft and non-tender without masses, organomegaly or hernias noted.  No guarding or rebound. No flank tenderness to percussion. Vascular : all pulses equal ; no bruits present. Skin:Warm & dry.  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 She's been asked to consider retirement facility or living with relatives because of the long-term risk of serious injury due to her instability.

## 2015-03-21 NOTE — Progress Notes (Signed)
Pre visit review using our clinic review tool, if applicable. No additional management support is needed unless otherwise documented below in the visit note. 

## 2015-03-22 DIAGNOSIS — R339 Retention of urine, unspecified: Secondary | ICD-10-CM | POA: Diagnosis not present

## 2015-03-22 DIAGNOSIS — N3281 Overactive bladder: Secondary | ICD-10-CM | POA: Diagnosis not present

## 2015-03-22 DIAGNOSIS — N3941 Urge incontinence: Secondary | ICD-10-CM | POA: Diagnosis not present

## 2015-04-08 ENCOUNTER — Ambulatory Visit: Payer: Medicare Other | Attending: Internal Medicine

## 2015-04-08 ENCOUNTER — Ambulatory Visit: Payer: Medicare Other | Admitting: Nurse Practitioner

## 2015-04-08 ENCOUNTER — Encounter: Payer: Self-pay | Admitting: Nurse Practitioner

## 2015-04-08 ENCOUNTER — Ambulatory Visit (INDEPENDENT_AMBULATORY_CARE_PROVIDER_SITE_OTHER): Payer: Medicare Other | Admitting: Nurse Practitioner

## 2015-04-08 VITALS — BP 167/85 | HR 82 | Ht 66.0 in | Wt 209.4 lb

## 2015-04-08 DIAGNOSIS — R2681 Unsteadiness on feet: Secondary | ICD-10-CM | POA: Diagnosis not present

## 2015-04-08 DIAGNOSIS — M4806 Spinal stenosis, lumbar region: Secondary | ICD-10-CM | POA: Insufficient documentation

## 2015-04-08 DIAGNOSIS — R29898 Other symptoms and signs involving the musculoskeletal system: Secondary | ICD-10-CM

## 2015-04-08 DIAGNOSIS — R269 Unspecified abnormalities of gait and mobility: Secondary | ICD-10-CM

## 2015-04-08 DIAGNOSIS — M170 Bilateral primary osteoarthritis of knee: Secondary | ICD-10-CM | POA: Insufficient documentation

## 2015-04-08 DIAGNOSIS — R413 Other amnesia: Secondary | ICD-10-CM

## 2015-04-08 DIAGNOSIS — M545 Low back pain: Secondary | ICD-10-CM

## 2015-04-08 MED ORDER — DONEPEZIL HCL 10 MG PO TABS: 10.0000 mg | ORAL_TABLET | Freq: Every day | ORAL | Status: DC

## 2015-04-08 MED ORDER — MEMANTINE HCL ER 28 MG PO CP24: 28.0000 mg | ORAL_CAPSULE | Freq: Every day | ORAL | Status: DC

## 2015-04-08 NOTE — Patient Instructions (Signed)
KNEE: Extension, Long Arc Quad (Weight)  Place weight around leg. Raise leg until knee is straight. Hold _5__ seconds. Use ___ lb weight. _10__ reps per set (each leg), 4-5__ sets per day, __7_ days per week  Copyright  VHI. All rights reserved.   Knee Raise   Lift knee and then lower it. Repeat with other knee. Repeat _10__ times each leg. Do _4-5___ sessions per day.  http://[email or web address].exer.us/445   Copyright  VHI. All rights reserved.  Toe Up   Gently rise up on toes and back on heels. Repeat _20___ times. Do 4-5____ sessions per day.  http://[email or web address].exer.us/455   Copyright  VHI. All rights reserved.

## 2015-04-08 NOTE — Progress Notes (Signed)
[facility]  PATIENT: [name] DOB: 07/01/1925   REASON FOR VISIT: Follow-up for memory loss  HISTORY FROM: Patient and daughter    HISTORY OF PRESENT ILLNESS:[name] 81 year old right-handed Caucasian female, accompanied by her daughter and son-in-law and daughter at today's clinical visit.  I saw her intitially in 04/2010, She is referred by her primary care Dr. Unice Cobble for electiagnostic study, because acute onset of left hand numbness and weakness 2 weeks ago. EMG nerve conduction study has demonstrated a mild right carpal tunnel syndrome, there is no evidence of left carpal tunnel syndrome, or left cervical radiculopathy.  Patient has past medical history of coronary artery disease, status post bypass, and pacemaker placement for arrhythmia, hypertension, diabetes, hyperlipidemia, right knee replacement, baseline mild gait difficulty. she presenting with acute onset left hand weakness and numbness 2 weeks ago,.  She was dining out with her family, suddenly things began to drop off of her left hand, she also noticed left-hand numbness,she couldn't feel things slipping out off her hands.  Since its onset she has slight improvement, but still has intermittent left hand numbness and weakness.  She already had some baseline gait difficulty due to her right knee replacement, there is no significant worsening.she denied left facial involvement, no dysarthria.  ECHO EF 45-50%, no thrombus, [place] cartoid A: no significant stenosis. CT head showed small vessel disease. No acute lesions.  She had 14 years of education, retired as a Optician, dispensing at age 48, took care of her grandchildren afterwards, she now lives alone at her townhouse of more than 20 years.  She is referred back by Dr. Linna Darner in June 2013., she reported episodes of confusion, the most recent ones were 3 weeks ago, while driving on a famililar route, she got confused, but she was  able to find her way back, also reported difficulty with people's name,word finding difficulties over the past couple years, more obvious now, also had difficulties figuring out her checkbook  She has mild hard of hearing, left worse than right, no significant visual difficulty, repeat CAT scan showed no acute lesion, atrophy, small vessel disease, small right meningioma, no significant change, ultrasound carotid artery no significant stenosis  Laboratory showed normal TSH, elevated creatinine 1.4, A1c 7.8  Last visit was in 06/16/2012: She continued to decline over the past few months, getting more confused, has difficulty managing her medications, increased gait difficulty over past couple weeks, with new left arm, leg weakness, reported most recent ultrasound of carotid arteries showed mild to moderate stenosis, she is taking Plavix, also bromocriptine 2.5 mg twice a day for reasons wasn't sure  UPDATE Apr 08 2015 [name], 81 year old female returns for follow-up. She has a history of memory loss. She still lives alone, memory is stable  she has difficulty opening cans, she has helper to clean house, she has more trouble walking, 3 falls in the last year, currently receiving some physical therapy. She has been diagnosed with spinal stenosis. Daughter is worry about her, that she is more forgetful, difficulty handling her medications, sometimes using walker crossing the busy major road with a lot of traffic. She has mild worsening gait difficulty, bilateral knee pain, but she now has [employer]. Daughter would like for mother to come and live with her but the mother absolutely refuses and it does seem like the current living situation is steady. Daughter checks on mother several times a day    REVIEW OF SYSTEMS: Full 14 system review  of systems performed and notable only for those listed, all others are neg:  Constitutional: neg  Cardiovascular: Leg swelling  Ear/Nose/Throat: neg  Skin:  neg Eyes: neg Respiratory: Shortness of breath Gastroitestinal: neg  Hematology/Lymphatic: neg  Endocrine: neg Musculoskeletal: Joint pain, joint swelling, difficulty walking  Allergy/Immunology: neg Neurological: Memory loss, weakness Psychiatric: Depression and anxiety Sleep : neg   ALLERGIES: Allergies  Allergen Reactions  . Codeine Rash    HOME MEDICATIONS: Outpatient Prescriptions Prior to Visit  Medication Sig Dispense Refill  . acarbose (PRECOSE) 50 MG tablet TAKE 1 TABLET BY MOUTH 3 TIMES A DAY WITH MEALS 90 tablet 11  . amitriptyline (ELAVIL) 25 MG tablet TAKE 1 TABLET (25 MG TOTAL) BY MOUTH AT BEDTIME. 90 tablet 2  . atorvastatin (LIPITOR) 20 MG tablet TAKE 1 TABLET BY MOUTH EVERY DAY (IN PLACE OF PRAVASTATIN) 30 tablet 0  . bromocriptine (PARLODEL) 2.5 MG tablet TAKE 1 TABLET (2.5 MG TOTAL) BY MOUTH AT BEDTIME. 30 tablet 10  . Calcium Carbonate (CALCIUM 500 PO) Take 500 mg by mouth daily.     . clopidogrel (PLAVIX) 75 MG tablet TAKE 1 TABLET BY MOUTH DAILY 30 tablet 9  . colesevelam (WELCHOL) 625 MG tablet Take 3 tablets (1,875 mg total) by mouth 2 (two) times daily before a meal. 540 tablet 3  . donepezil (ARICEPT) 10 MG tablet Take 10 mg by mouth at bedtime.    . enalapril (VASOTEC) 20 MG tablet TAKE 1 TABLET BY MOUTH DAILY 90 tablet 1  . fish oil-omega-3 fatty acids 1000 MG capsule Take 1 g by mouth daily.    . INVOKANA 100 MG TABS tablet TAKE 1 TABLET BY MOUTH EVERY DAY 90 tablet 2  . levothyroxine (SYNTHROID, LEVOTHROID) 25 MCG tablet 1 1/2 by mouth daily 135 tablet 3  . Memantine HCl ER (NAMENDA XR) 28 MG CP24 Take 28 mg by mouth daily. 90 capsule 3  . metoprolol (TOPROL-XL) 200 MG 24 hr tablet TAKE 1 TABLET BY MOUTH EVERY DAY 30 tablet 5  . naproxen sodium (ANAPROX) 220 MG tablet Take 220 mg by mouth 2 (two) times daily as needed.    . niacin 500 MG tablet Take 500 mg by mouth daily with breakfast.     . nitroGLYCERIN (NITROSTAT) 0.3 MG SL tablet Place 1 tablet  (0.3 mg total) under the tongue every 5 (five) minutes as needed. For chest pain 90 tablet 3  . pantoprazole (PROTONIX) 40 MG tablet TAKE 1 TABLET BY MOUTH ONCE DAILY 90 tablet 1  . repaglinide (PRANDIN) 2 MG tablet TAKE 1 TABLET (2 MG TOTAL) BY MOUTH 3 (THREE) TIMES DAILY BEFORE MEALS. 90 tablet 11  . sitaGLIPtin (JANUVIA) 100 MG tablet Take 1 tablet (100 mg total) by mouth daily. 30 tablet 11   No facility-administered medications prior to visit.    PAST MEDICAL HISTORY: Past Medical History  Diagnosis Date  . Sliding hiatal hernia   . Erosive esophagitis   . Peptic stricture of esophagus   . Diverticulosis   . Arthritis   . Hypothyroidism   . Diabetes mellitus     Type I  . Coronary artery disease     s/p CABG 2001  . Sick sinus syndrome     s/p Medtronic DDD pacemaker implanted; generator change 02/2012  . Hypertension   . Hyperlipidemia   . GERD (gastroesophageal reflux disease)   . Adenomatous colon polyp 07/2002  . CVA (cerebral vascular accident)     Dr Evelena Leyden, Neurology  .  Obesity     PAST SURGICAL HISTORY: Past Surgical History  Procedure Laterality Date  . Pacemaker placement  03/25/2000    by Dr Olevia Perches  . Tonsillectomy and adenoidectomy    . Appendectomy    . Cholecystectomy    . Total abdominal hysterectomy w/ bilateral salpingoophorectomy      Endometiosis  . Replacement total knee  2004    Right  . Colonoscopy w/ polypectomy  2003  . Cataract extraction  05/2005  . Coronary artery bypass graft  02/2000    5 vessel  . Rotator cuff repair    . Pacemaker generator replacement  03/2012    Dr. Rayann Heman  . Permanent pacemaker generator change  03/18/2012    Procedure: PERMANENT PACEMAKER GENERATOR CHANGE;  Surgeon: Thompson Grayer, MD;  Location: [facility] CATH LAB;  Service: Cardiovascular;;    FAMILY HISTORY: Family History  Problem Relation Age of Onset  . Colon cancer Mother 45  . Breast cancer Mother   . Transient ischemic attack Mother   . Cancer Mother   .  Hypertension Mother   . Stroke Mother   . Diabetes Mother   . Colon cancer Brother 62  . Cancer Brother   . Hypertension Brother   . Heart attack Brother   . Breast cancer Sister   . Cancer Sister   . Diabetes Sister   . Prostate cancer Brother   . Heart attack Brother   . Heart disease      3 brothers had MI; 1 pre 77  . Hypertension Sister   . Heart attack Daughter   . Diabetes Maternal Aunt     SOCIAL HISTORY: History   Social History  . Marital Status: Widowed    Spouse Name: N/A  . Number of Children: 1  . Years of Education: N/A   Occupational History  . Retired    Social History Main Topics  . Smoking status: Former Research scientist (life sciences)  . Smokeless tobacco: Former Systems developer    Quit date: 11/23/1981     Comment: quit 28+ yrs ago (as of 2012)  . Alcohol Use: No  . Drug Use: No  . Sexual Activity: No   Other Topics Concern  . Not on file   Social History Narrative   Patient lives at home alone. Patient's daughter was with her [name] ) [phone]   Retired.   Education college   Right handed.   Caffeine one cup of coffee daily.   Marland Kitchen     PHYSICAL EXAM  Filed Vitals:   04/08/15 1432  BP: 167/85  Pulse: 82  Height: 5\' 6"  (1.676 m)  Weight: 209 lb 6.4 oz (94.983 kg)   Body mass index is 33.81 kg/(m^2). Generalized: In no acute distress, obese female Neck: Supple, no carotid bruits  Musculoskeletal: No deformity  Neurological examination  Mentation: Alert oriented to time, place, history taking, and causual conversation, Mini-Mental Status Examination 27 out of 30, she missed 1 out of 3 recalls, mild difficulty copy design. AFT 7.  Cranial nerve II-XII: Pupils were equal round reactive to light. Extraocular movements were full. Visual field were full on confrontational test. Bilateral fundi were sharp. Facial sensation and strength were normal. Hearing was intact to finger rubbing bilaterally. Uvula tongue midline. Head turning and shoulder shrug and were  normal and symmetric.Tongue protrusion into cheek strength was normal. Motor: Normal tone, bulk and strength. Sensory: Intact to fine touch  Coordination: Normal finger to nose, heel-to-shin bilaterally there was no truncal ataxia  Gait: Rising up from seated position pushing on chair. Ambulated 30 feet in the hall with quad cane, short steppage, no difficulty with turns      Component Value Date/Time   NA 137 12/24/2014 1422   K 4.3 12/24/2014 1422   CL 105 12/24/2014 1422   CO2 23 12/24/2014 1422   GLUCOSE 128* 12/24/2014 1422   BUN 24* 12/24/2014 1422   CREATININE 1.50* 12/24/2014 1422   CALCIUM 9.8 12/24/2014 1422   PROT 7.2 11/24/2012 0949   ALBUMIN 4.0 11/24/2012 0949   AST 25 11/24/2012 0949   ALT 23 11/24/2012 0949   ALKPHOS 20* 11/24/2012 0949   BILITOT 0.7 11/24/2012 0949   GFRNONAA 34* 03/12/2012 1424   GFRAA 39* 03/12/2012 1424   Lab Results  Component Value Date   CHOL 158 08/27/2014   HDL 46.50 08/27/2014   LDLDIRECT 69.4 08/27/2014   TRIG 274.0* 08/27/2014   CHOLHDL 3 08/27/2014   Lab Results  Component Value Date   HGBA1C 7.1* 12/24/2014    Lab Results  Component Value Date   TSH 4.34 12/24/2014      ASSESSMENT AND PLAN  81 y.o. year old female  has a past medical history of memory loss gradual onset. Memory score has been stable since last year. She has mild gait difficulty and is currently receiving physical therapy.  Memory score is stable 27/30.  Continue Aricept at current dose will refill for 3 months with 3 refills Continue Namenda will refill 3 months with 3 refills Follow-up yearly and when necessary  Dennie Bible, [facility], [facility], [facility] [address] [place], [place] [address] [phone]

## 2015-04-08 NOTE — Therapy (Signed)
[facility] [address], [place], [place], [address] Phone: [phone]   Fax:  [phone]  Physical Therapy Evaluation  Patient Details  Name: [name] MRN: [id number] Date of Birth: Jul 12, 1925 Referring Provider:  Hendricks Limes, MD  Encounter Date: 04/08/2015      PT End of Session - 04/08/15 1020    Visit Number 1   Number of Visits 10  medicare   Date for PT Re-Evaluation 06/03/15   PT Start Time 0931   PT Stop Time 1018   PT Time Calculation (min) 47 min   Activity Tolerance Patient tolerated treatment well   Behavior During Therapy [facility] for tasks assessed/performed      Past Medical History  Diagnosis Date  . Sliding hiatal hernia   . Erosive esophagitis   . Peptic stricture of esophagus   . Diverticulosis   . Arthritis   . Hypothyroidism   . Diabetes mellitus     Type I  . Coronary artery disease     s/p CABG 2001  . Sick sinus syndrome     s/p Medtronic DDD pacemaker implanted; generator change 02/2012  . Hypertension   . Hyperlipidemia   . GERD (gastroesophageal reflux disease)   . Adenomatous colon polyp 07/2002  . CVA (cerebral vascular accident)     Dr Evelena Leyden, Neurology  . Obesity     Past Surgical History  Procedure Laterality Date  . Pacemaker placement  03/25/2000    by Dr Olevia Perches  . Tonsillectomy and adenoidectomy    . Appendectomy    . Cholecystectomy    . Total abdominal hysterectomy w/ bilateral salpingoophorectomy      Endometiosis  . Replacement total knee  2004    Right  . Colonoscopy w/ polypectomy  2003  . Cataract extraction  05/2005  . Coronary artery bypass graft  02/2000    5 vessel  . Rotator cuff repair    . Pacemaker generator replacement  03/2012    Dr. Rayann Heman  . Permanent pacemaker generator change  03/18/2012    Procedure: PERMANENT PACEMAKER GENERATOR CHANGE;  Surgeon: Thompson Grayer, MD;  Location: [facility] CATH LAB;  Service: Cardiovascular;;    There  were no vitals filed for this visit.  Visit Diagnosis:  Abnormality of gait - Plan: PT plan of care cert/re-cert  Weakness of both legs - Plan: PT plan of care cert/re-cert  Gait instability - Plan: PT plan of care cert/re-cert  Right low back pain, with sciatica presence unspecified - Plan: PT plan of care cert/re-cert      Subjective Assessment - 04/08/15 0941    Subjective Pt is a 81 y.o. female who presents to PT with her daughter with history of stenosis and knee OA lasting many years.  Pt had a fall on 03/19/15 when she was out walking with her walker.  Pt has significant lumbar and Lt knee pain that limits mobility.    Patient is accompained by: Family member   Pertinent History Rt TKA, CVA   Limitations Standing   How long can you stand comfortably? 20 minutes   How long can you walk comfortably? 10 minutes   Diagnostic tests none recent   Patient Stated Goals improve gait and balance   Currently in Pain? Yes   Pain Score 8    Pain Location Back   Pain Orientation Left;Right   Pain Descriptors / Indicators Sore;Aching;Constant   Pain Type Chronic pain   Pain Onset More  than a month ago   Pain Frequency Constant   Aggravating Factors  walking, standing   Pain Relieving Factors sitting, medication   Effect of Pain on Daily Activities limited standing   Multiple Pain Sites Yes   Pain Score 9   Pain Location Knee   Pain Orientation Left   Pain Type Chronic pain   Pain Onset More than a month ago   Pain Frequency Constant   Aggravating Factors  weather, standing/walking   Pain Relieving Factors Aleve            [facility] PT Assessment - 04/08/15 0001    Assessment   Medical Diagnosis spinal stenosis (M48.06), lumbar region with neurogenic claudication. Primary OA of both knees(M17.0)   Onset Date 03/19/15  chronic stenosis and frequent falls   Next MD Visit none scheduled   Precautions   Precautions Fall;Other (comment)  dementia, pacemaker   Precaution Comments  no e-stim   Balance Screen   Has the patient fallen in the past 6 months Yes  PT will address gait and balance   How many times? 1   Has the patient had a decrease in activity level because of a fear of falling?  Yes   Is the patient reluctant to leave their home because of a fear of falling?  Yes   [place] Private residence   Living Arrangements Alone   Type of [place] to enter   Entrance Stairs-Number of Steps 1   [place] One level   Prior Function   Level of Independence Needs assistance with homemaking;Requires assistive device for independence   Vocation Retired   Associate Professor   Overall Cognitive Status History of cognitive impairments - at baseline   Memory Impaired   Observation/Other Assessments   Focus on Therapeutic Outcomes (FOTO)  75% limitation   Posture/Postural Control   Posture/Postural Control Postural limitations   Postural Limitations Rounded Shoulders;Forward head   ROM / Strength   AROM / PROM / Strength AROM;PROM;Strength   AROM   Overall AROM  Deficits   Overall AROM Comments Lt knee AROM extension lacking ~25% due to pain and weakness   PROM   Overall PROM  Within functional limits for tasks performed   Strength   Overall Strength Deficits   Overall Strength Comments shoulder flexion and abduction 4/5 bilaterally. Rt knee 4+/5, hip 4/5, ankle 5/5.  Lt knee: 4/5, hip 4-/5, ankle 5/5   Transfers   Transfers Sit to Stand   Sit to Stand 4: Min guard   Ambulation/Gait   Ambulation/Gait Yes   Ambulation/Gait Assistance 4: Min guard   Ambulation Distance (Feet) 75 Feet   Assistive device Small based quad cane   Gait Pattern Decreased stride length;Step-through pattern;Shuffle;Left flexed knee in stance;Right flexed knee in stance;Decreased trunk rotation;Trunk flexed   Ambulation Surface Level   Balance   Balance Assessed Yes   Standardized Balance Assessment   Standardized Balance Assessment Timed  Up and Go Test   Timed Up and Go Test   TUG Normal TUG   Normal TUG (seconds) 52                           PT Education - 04/08/15 1007    Education provided Yes   Education Details HEP: seated marching, long arc quads and heel/toe raises   Person(s) Educated Patient;Child(ren)   Methods Explanation;Demonstration;Handout   Comprehension Verbalized  understanding;Returned demonstration          PT Short Term Goals - 2015/04/20 1022    PT SHORT TERM GOAL #1   Title be independent in initial HEP   Time 4   Period Weeks   Status New   PT SHORT TERM GOAL #2   Title ambulate safely with a rolling walker to improve independence at home   Time 4   Period Weeks   Status New   PT SHORT TERM GOAL #3   Title perform TUG in < or = to 40 seconds to improve safety   Time 4   Period Weeks   Status New           PT Long Term Goals - 04/20/15 5573    PT LONG TERM GOAL #1   Title be independent in advanced HEP   Time 8   Period Weeks   Status New   PT LONG TERM GOAL #2   Title reduce FOTO to < or = to 55% limitation   Time 8   Period Weeks   Status New   PT LONG TERM GOAL #3   Title perform TUG in < or = to 35 seconds to improve safety   Time 8   Period Weeks   Status New   PT LONG TERM GOAL #4   Title improve LE strength to perform sit to stand with moderate UE support   Time 8   Period Weeks   Status New   PT LONG TERM GOAL #5   Title report a 25% reduction in low back pain with standing tasks   Time 8   Period Weeks   Status New               Plan - 20-Apr-2015 1026    Clinical Impression Statement Pt is a 81 y.o female with advanced lumbar spine and Lt kne arthritis.  Pt demonstrates LE weakness, gait instability and endurance deficits.  Pt will benefit from PT for strength progression, gait and balance training and modalities for pain control    Pt will benefit from skilled therapeutic intervention in order to improve on the following  deficits Abnormal gait;Decreased coordination;Difficulty walking;Decreased safety awareness;Decreased endurance;Decreased activity tolerance;Pain;Decreased balance;Decreased strength   Rehab Potential Good   PT Frequency 2x / week   PT Duration 8 weeks   PT Treatment/Interventions ADLs/Self Care Home Management;Moist Heat;Therapeutic activities;Patient/family education;Therapeutic exercise;Ultrasound;Manual techniques;Balance training;Functional mobility training;Cryotherapy;Neuromuscular re-education   PT Next Visit Plan LE strength, practice transfters and gait, modalities for LBP/Lt knee as needed (no e-stim)   Recommended Other Services Pt would benefit from a 4 wheeled rolling walker with a seat for stability and allow for rest when she has pain or fatigue with standing   Consulted and Agree with Plan of Care Patient;Family member/caregiver          G-Codes - 20-Apr-2015 2202    Functional Assessment Tool Used FOTO: 75% limitation   Functional Limitation Other PT primary   Other PT Primary Current Status (R4270) At least 60 percent but less than 80 percent impaired, limited or restricted   Other PT Primary Goal Status (W2376) At least 40 percent but less than 60 percent impaired, limited or restricted       Problem List Patient Active Problem List   Diagnosis Date Noted  . Memory loss 04/05/2014  . Type II or unspecified type diabetes mellitus with neurological manifestations, uncontrolled 01/24/2014  . Chronic renal insufficiency, stage II (mild) 03/21/2013  .  Encounter for long-term (current) use of other medications 07/29/2012  . Diabetes 04/28/2012  . CVA (cerebral vascular accident)   . EDEMA- LOCALIZED 01/06/2011  . Chronic systolic heart failure 17/79/3903  . GERD 04/30/2010  . TRANSIENT ISCHEMIC ATTACKS, HX OF 04/30/2010  . DYSPEPSIA 04/14/2010  . WEAKNESS 03/19/2010  . CONSTIPATION 02/13/2010  . ANEMIA, MILD 01/14/2010  . HYPERLIPIDEMIA-MIXED 09/16/2008  .  HYPERTENSION, BENIGN 09/16/2008  . CAD, AUTOLOGOUS BYPASS GRAFT 09/16/2008  . SICK SINUS/ TACHY-BRADY SYNDROME 09/16/2008  . PACEMAKER, PERMANENT 09/16/2008  . CAD 10/25/2007  . Hypothyroidism 06/14/2007  . COLONIC POLYPS, HX OF 03/23/2007    TAKACS,KELLY, PT 04/08/2015, 10:32 AM  [facility] [address] [place], [place], [address] Phone: [phone]   Fax:  [phone]

## 2015-04-08 NOTE — Patient Instructions (Signed)
Memory score is stable Continue Aricept at current dose will refill for 3 months with 3 refills Continue Namenda will refill 3 months with 3 refills Follow-up yearly and when necessary

## 2015-04-09 ENCOUNTER — Ambulatory Visit: Payer: Medicare Other | Admitting: Nurse Practitioner

## 2015-04-10 NOTE — Progress Notes (Signed)
I have reviewed and agreed above plan. 

## 2015-04-11 ENCOUNTER — Ambulatory Visit: Payer: Medicare Other | Admitting: Physical Therapy

## 2015-04-11 ENCOUNTER — Encounter: Payer: Self-pay | Admitting: Physical Therapy

## 2015-04-11 DIAGNOSIS — M170 Bilateral primary osteoarthritis of knee: Secondary | ICD-10-CM | POA: Diagnosis not present

## 2015-04-11 DIAGNOSIS — R2681 Unsteadiness on feet: Secondary | ICD-10-CM | POA: Diagnosis not present

## 2015-04-11 DIAGNOSIS — R29898 Other symptoms and signs involving the musculoskeletal system: Secondary | ICD-10-CM

## 2015-04-11 DIAGNOSIS — M4806 Spinal stenosis, lumbar region: Secondary | ICD-10-CM | POA: Diagnosis not present

## 2015-04-11 DIAGNOSIS — R269 Unspecified abnormalities of gait and mobility: Secondary | ICD-10-CM

## 2015-04-11 NOTE — Therapy (Signed)
[facility] [address] [place], [place], [address] Phone: [phone]   Fax:  [phone]  Physical Therapy Treatment  Patient Details  Name: [name] MRN: [id number] Date of Birth: [birth year] Referring Provider:  Hendricks Limes, MD  Encounter Date: 04/11/2015      PT End of Session - 04/11/15 1045    Visit Number 2   Number of Visits 10  Medicare   Date for PT Re-Evaluation 06/03/15   PT Start Time 4854   PT Stop Time 1100   PT Time Calculation (min) 45 min   Activity Tolerance Patient tolerated treatment well   Behavior During Therapy [facility] for tasks assessed/performed      Past Medical History  Diagnosis Date  . Sliding hiatal hernia   . Erosive esophagitis   . Peptic stricture of esophagus   . Diverticulosis   . Arthritis   . Hypothyroidism   . Diabetes mellitus     Type I  . Coronary artery disease     s/p CABG 2001  . Sick sinus syndrome     s/p Medtronic DDD pacemaker implanted; generator change 02/2012  . Hypertension   . Hyperlipidemia   . GERD (gastroesophageal reflux disease)   . Adenomatous colon polyp 07/2002  . CVA (cerebral vascular accident)     Dr Evelena Leyden, Neurology  . Obesity     Past Surgical History  Procedure Laterality Date  . Pacemaker placement  03/25/2000    by Dr Olevia Perches  . Tonsillectomy and adenoidectomy    . Appendectomy    . Cholecystectomy    . Total abdominal hysterectomy w/ bilateral salpingoophorectomy      Endometiosis  . Replacement total knee  2004    Right  . Colonoscopy w/ polypectomy  2003  . Cataract extraction  05/2005  . Coronary artery bypass graft  02/2000    5 vessel  . Rotator cuff repair    . Pacemaker generator replacement  03/2012    Dr. Rayann Heman  . Permanent pacemaker generator change  03/18/2012    Procedure: PERMANENT PACEMAKER GENERATOR CHANGE;  Surgeon: Thompson Grayer, MD;  Location: [facility] CATH LAB;  Service: Cardiovascular;;    There were  no vitals filed for this visit.  Visit Diagnosis:  Abnormality of gait  Weakness of both legs      Subjective Assessment - 04/11/15 1026    Subjective I feel good today.  I got out of bed and fixed breakfast today.    Limitations Standing   How long can you stand comfortably? 20 minutes   How long can you walk comfortably? 10 minutes   Diagnostic tests none recent   Patient Stated Goals improve gait and balance   Pain Score 9   Pain Location Knee   Pain Orientation Left   Pain Descriptors / Indicators Sharp;Stabbing   Pain Type Chronic pain   Pain Onset More than a month ago   Pain Frequency Constant   Aggravating Factors  weather, standing and walking   Pain Relieving Factors Alleve                         [facility] Adult PT Treatment/Exercise - 04/11/15 0001    Knee/Hip Exercises: Aerobic   Stationary Bike 8 min. with assistance to keep feet on pedal   Knee/Hip Exercises: Standing   Other Standing Knee Exercises Standing hip extension 10x bil., abd. 10x with bil. hand support  Other Standing Knee Exercises sit to stand with min assistance to get her left knee straight 5 times   Knee/Hip Exercises: Seated   Long Arc Quad Strengthening;Right;Left;2 sets;10 reps   Long Arc Quad Weight 2 lbs.   Long Arc Quad Limitations VC to fully extend knees   Other Seated Knee Exercises seated rocker board 3 min withVC to move through the motion   Other Seated Knee Exercises hip add with ball squeeze hold 10 sec, 10x ; hip abd with red band 20x with tactile cues to work on control                                                                                                                                                                                                                                                                                                                                           PT Short Term Goals - 04/08/15 1022    PT SHORT TERM GOAL #1    Title be independent in initial HEP   Time 4   Period Weeks   Status New   PT SHORT TERM GOAL #2   Title ambulate safely with a rolling walker to improve independence at home   Time 4   Period Weeks   Status New   PT SHORT TERM GOAL #3   Title perform TUG in < or = to 40 seconds to improve safety   Time 4   Period Weeks   Status New           PT Long Term Goals - 04/08/15 2841    PT LONG TERM GOAL #1   Title be independent in advanced HEP   Time 8   Period Weeks   Status New   PT LONG TERM GOAL #2   Title reduce FOTO to < or = to 55% limitation   Time 8   Period  Weeks   Status New   PT LONG TERM GOAL #3   Title perform TUG in < or = to 35 seconds to improve safety   Time 8   Period Weeks   Status New   PT LONG TERM GOAL #4   Title improve LE strength to perform sit to stand with moderate UE support   Time 8   Period Weeks   Status New   PT LONG TERM GOAL #5   Title report a 25% reduction in low back pain with standing tasks   Time 8   Period Weeks   Status New               Plan - 04/11/15 1056    Clinical Impression Statement Patient comes to therapy moving very slowly.  Patient had difficulty with keeping her feet on the bike pedals and needed therapist with her.  Patient is unable to walk  without her cane. Patient has difficulty with standing exercises due to left knee pain.  Patient required verbal cues due to needing correction on exercises.  Paitent  will continue to need therapy for strengthening and working on gait deficits.    Pt will benefit from skilled therapeutic intervention in order to improve on the following deficits Abnormal gait;Decreased coordination;Difficulty walking;Decreased safety awareness;Decreased endurance;Decreased activity tolerance;Pain;Decreased balance;Decreased strength   Rehab Potential Good   PT Frequency 2x / week   PT Duration 8 weeks   PT Treatment/Interventions ADLs/Self Care Home Management;Moist  Heat;Therapeutic activities;Patient/family education;Therapeutic exercise;Ultrasound;Manual techniques;Balance training;Functional mobility training;Cryotherapy;Neuromuscular re-education   PT Next Visit Plan work on HEP, modalities for back pain if needed   PT Home Exercise Plan LAQ, hip abd with band, hip add. with pillow   Consulted and Agree with Plan of Care Patient;Family member/caregiver        Problem List Patient Active Problem List   Diagnosis Date Noted  . Memory loss 04/05/2014  . Type II or unspecified type diabetes mellitus with neurological manifestations, uncontrolled 01/24/2014  . Chronic renal insufficiency, stage II (mild) 03/21/2013  . Encounter for long-term (current) use of other medications 07/29/2012  . Diabetes 04/28/2012  . CVA (cerebral vascular accident)   . EDEMA- LOCALIZED 01/06/2011  . Chronic systolic heart failure 19/50/9326  . GERD 04/30/2010  . TRANSIENT ISCHEMIC ATTACKS, HX OF 04/30/2010  . DYSPEPSIA 04/14/2010  . WEAKNESS 03/19/2010  . CONSTIPATION 02/13/2010  . ANEMIA, MILD 01/14/2010  . HYPERLIPIDEMIA-MIXED 09/16/2008  . HYPERTENSION, BENIGN 09/16/2008  . CAD, AUTOLOGOUS BYPASS GRAFT 09/16/2008  . SICK SINUS/ TACHY-BRADY SYNDROME 09/16/2008  . PACEMAKER, PERMANENT 09/16/2008  . CAD 10/25/2007  . Hypothyroidism 06/14/2007  . COLONIC POLYPS, HX OF 03/23/2007    Bryant Lipps,PT 04/11/2015, 11:01 AM  [facility] [address], [place], [place], [address] Phone: [phone]   Fax:  [phone]

## 2015-04-13 ENCOUNTER — Other Ambulatory Visit: Payer: Self-pay | Admitting: Internal Medicine

## 2015-04-16 ENCOUNTER — Encounter: Payer: Self-pay | Admitting: Physical Therapy

## 2015-04-16 ENCOUNTER — Ambulatory Visit: Payer: Medicare Other | Admitting: Physical Therapy

## 2015-04-16 DIAGNOSIS — M170 Bilateral primary osteoarthritis of knee: Secondary | ICD-10-CM | POA: Diagnosis not present

## 2015-04-16 DIAGNOSIS — R2681 Unsteadiness on feet: Secondary | ICD-10-CM | POA: Diagnosis not present

## 2015-04-16 DIAGNOSIS — R29898 Other symptoms and signs involving the musculoskeletal system: Secondary | ICD-10-CM

## 2015-04-16 DIAGNOSIS — M4806 Spinal stenosis, lumbar region: Secondary | ICD-10-CM | POA: Diagnosis not present

## 2015-04-16 DIAGNOSIS — R269 Unspecified abnormalities of gait and mobility: Secondary | ICD-10-CM

## 2015-04-16 NOTE — Patient Instructions (Signed)
ADDUCTION: Isometric  Do while sitting in a chair With ball between knees, squeeze them inward. Hold _10__ seconds. Complete _10__ sets of _2__ repetitions. Perform _2__ sessions per day.  http://gtsc.exer.us/124   Copyright  VHI. All rights reserved.  ABDUCTION: Sitting - Resistance Band (Active)   Sit with feet flat.  Tie green resistance band around knees draw both legs out to side. Complete ___ sets of ___ repetitions. Perform ___ sessions per day.  Copyright  VHI. All rights reserved.  EXTENSION: Sitting (Active)   Sit with feet flat. Straighten right knee. Use _0__ lbs. Complete __20_ sets of _1__ repetitions. Perform _1__ sessions per day.  http://gtsc.exer.us/268   Copyright  VHI. All rights reserved.

## 2015-04-16 NOTE — Therapy (Signed)
[facility] [address], [place], [place], [address] Phone: [phone]   Fax:  [phone]  Physical Therapy Treatment  Patient Details  Name: [name] MRN: [id number] Date of Birth: 14-Jul-1925 Referring Provider:  Hendricks Limes, MD  Encounter Date: 04/16/2015      PT End of Session - 04/16/15 0853    Visit Number 3   Number of Visits 10  Medicare   Date for PT Re-Evaluation 06/03/15   PT Start Time 0845   PT Stop Time 0925   PT Time Calculation (min) 40 min   Activity Tolerance Patient tolerated treatment well   Behavior During Therapy [facility] for tasks assessed/performed      Past Medical History  Diagnosis Date  . Sliding hiatal hernia   . Erosive esophagitis   . Peptic stricture of esophagus   . Diverticulosis   . Arthritis   . Hypothyroidism   . Diabetes mellitus     Type I  . Coronary artery disease     s/p CABG 2001  . Sick sinus syndrome     s/p Medtronic DDD pacemaker implanted; generator change 02/2012  . Hypertension   . Hyperlipidemia   . GERD (gastroesophageal reflux disease)   . Adenomatous colon polyp 07/2002  . CVA (cerebral vascular accident)     Dr Evelena Leyden, Neurology  . Obesity     Past Surgical History  Procedure Laterality Date  . Pacemaker placement  03/25/2000    by Dr Olevia Perches  . Tonsillectomy and adenoidectomy    . Appendectomy    . Cholecystectomy    . Total abdominal hysterectomy w/ bilateral salpingoophorectomy      Endometiosis  . Replacement total knee  2004    Right  . Colonoscopy w/ polypectomy  2003  . Cataract extraction  05/2005  . Coronary artery bypass graft  02/2000    5 vessel  . Rotator cuff repair    . Pacemaker generator replacement  03/2012    Dr. Rayann Heman  . Permanent pacemaker generator change  03/18/2012    Procedure: PERMANENT PACEMAKER GENERATOR CHANGE;  Surgeon: Thompson Grayer, MD;  Location: [facility] CATH LAB;  Service: Cardiovascular;;    There were  no vitals filed for this visit.  Visit Diagnosis:  Abnormality of gait - Plan: PT plan of care cert/re-cert  Weakness of both legs  Gait instability - Plan: PT plan of care cert/re-cert      Subjective Assessment - 04/16/15 0851    Subjective My right leg is feeling good.     Pertinent History Rt TKA, CVA   Limitations Standing   How long can you stand comfortably? 20 minutes   How long can you walk comfortably? 10 minutes   Diagnostic tests none recent   Patient Stated Goals improve gait and balance   Currently in Pain? Yes   Pain Score 8    Pain Location Knee   Pain Orientation Left   Pain Descriptors / Indicators Aching   Pain Type Chronic pain   Pain Onset More than a month ago   Pain Frequency Intermittent   Aggravating Factors  bending left knee   Pain Relieving Factors not bending knee   Multiple Pain Sites No                         [facility] Adult PT Treatment/Exercise - 04/16/15 0001    Ambulation/Gait   Ambulation/Gait Yes   Ambulation/Gait Assistance  4: Min assist   Ambulation/Gait Assistance Details required VC to perform correct pattern with using the quad cane   Ambulation Distance (Feet) 20 Feet  2 times   Assistive device Small based quad cane   Ambulation Surface Level   Knee/Hip Exercises: Aerobic   Stationary Bike 8 min. with assistance to keep feet on pedal  able to keep her feet on the pedal by herself   Knee/Hip Exercises: Seated   Long Probation officer   Long Arc Quad Weight 2 lbs.   Long Arc Quad Limitations VC to fully extend knees   Other Seated Knee Exercises seated rocker board 3 min withVC to move through the motion   Other Seated Knee Exercises hip add with ball squeeze hold 10 sec, 10x ; hip abd with red band 20x with tactile cues to work on controll; sitting hp flexion 30 times with VC to raise knees                                                                                                                                                                                                                                                                                                                                          PT Education - 04/16/15 0908    Education provided Yes   Education Details LAQ, hip adduction with ball, hip abd with theraband   Person(s) Educated Patient   Methods Explanation;Demonstration;Tactile cues;Verbal cues;Handout   Comprehension Returned demonstration;Verbalized understanding          PT Short Term Goals - 04/16/15 0909    PT SHORT TERM GOAL #1   Title be independent in initial HEP   Time 4   Period Weeks   Status On-going  continues to need VC   PT SHORT TERM GOAL #2   Title ambulate safely with a rolling walker to improve independence at home   Time  4   Period Weeks   Status On-going  working on script to get walker   PT SHORT TERM GOAL #3   Title perform TUG in < or = to 40 seconds to improve safety   Time 4   Period Weeks   Status On-going  continues to walk very slowly and holds onto items           PT Long Term Goals - 04/08/15 0927    PT LONG TERM GOAL #1   Title be independent in advanced HEP   Time 8   Period Weeks   Status New   PT LONG TERM GOAL #2   Title reduce FOTO to < or = to 55% limitation   Time 8   Period Weeks   Status New   PT LONG TERM GOAL #3   Title perform TUG in < or = to 35 seconds to improve safety   Time 8   Period Weeks   Status New   PT LONG TERM GOAL #4   Title improve LE strength to perform sit to stand with moderate UE support   Time 8   Period Weeks   Status New   PT LONG TERM GOAL #5   Title report a 25% reduction in low back pain with standing tasks   Time 8   Period Weeks   Status New               Plan - 04/16/15 0913    Clinical Impression Statement Patient continues to require vc for treatment to have her perform them correctly and slowly.  Patient will hold onto items when  walking and requires vc to not.  Patient has not oredered a rolling walker. She is waiting for a prescription. Patient was able to keep her feet on the pedals on the bike for the first time without assistance. Patient needs many VC to walk with a quad cane.  She does not wait for the cane to be securely on the groud before she steps putting her at risk of falling. Patient has not met goals at this time due to working on strength and her HEP.    Pt will benefit from skilled therapeutic intervention in order to improve on the following deficits Abnormal gait;Decreased coordination;Difficulty walking;Decreased safety awareness;Decreased endurance;Decreased activity tolerance;Pain;Decreased balance;Decreased strength   Rehab Potential Good   PT Frequency 2x / week   PT Duration 8 weeks   PT Treatment/Interventions ADLs/Self Care Home Management;Moist Heat;Therapeutic activities;Patient/family education;Therapeutic exercise;Ultrasound;Manual techniques;Balance training;Functional mobility training;Cryotherapy;Neuromuscular re-education   PT Next Visit Plan work on LE strength, balance, gait; Check to see if MD signed script so patient can get walker   PT Home Exercise Plan review HEP   Consulted and Agree with Plan of Care Patient;Family member/caregiver        Problem List Patient Active Problem List   Diagnosis Date Noted  . Memory loss 04/05/2014  . Type II or unspecified type diabetes mellitus with neurological manifestations, uncontrolled 01/24/2014  . Chronic renal insufficiency, stage II (mild) 03/21/2013  . Encounter for long-term (current) use of other medications 07/29/2012  . Diabetes 04/28/2012  . CVA (cerebral vascular accident)   . EDEMA- LOCALIZED 01/06/2011  . Chronic systolic heart failure 90/24/0973  . GERD 04/30/2010  . TRANSIENT ISCHEMIC ATTACKS, HX OF 04/30/2010  . DYSPEPSIA 04/14/2010  . WEAKNESS 03/19/2010  . CONSTIPATION 02/13/2010  . ANEMIA, MILD 01/14/2010  .  HYPERLIPIDEMIA-MIXED 09/16/2008  . HYPERTENSION, BENIGN 09/16/2008  .  CAD, AUTOLOGOUS BYPASS GRAFT 09/16/2008  . SICK SINUS/ TACHY-BRADY SYNDROME 09/16/2008  . PACEMAKER, PERMANENT 09/16/2008  . CAD 10/25/2007  . Hypothyroidism 06/14/2007  . COLONIC POLYPS, HX OF 03/23/2007    ,,PT 04/16/2015, 9:29 AM  [facility] [address] [place], [place], [address] Phone: [phone]   Fax:  [phone]

## 2015-04-18 ENCOUNTER — Other Ambulatory Visit: Payer: Self-pay | Admitting: Internal Medicine

## 2015-04-18 ENCOUNTER — Ambulatory Visit: Payer: Medicare Other

## 2015-04-18 DIAGNOSIS — M4806 Spinal stenosis, lumbar region: Secondary | ICD-10-CM | POA: Diagnosis not present

## 2015-04-18 DIAGNOSIS — R2681 Unsteadiness on feet: Secondary | ICD-10-CM

## 2015-04-18 DIAGNOSIS — M170 Bilateral primary osteoarthritis of knee: Secondary | ICD-10-CM | POA: Diagnosis not present

## 2015-04-18 DIAGNOSIS — R269 Unspecified abnormalities of gait and mobility: Secondary | ICD-10-CM

## 2015-04-18 DIAGNOSIS — R29898 Other symptoms and signs involving the musculoskeletal system: Secondary | ICD-10-CM

## 2015-04-18 NOTE — Telephone Encounter (Signed)
lipitor rx sent to pharm

## 2015-04-18 NOTE — Therapy (Signed)
[facility] [address], [place], [place], [address] Phone: [phone]   Fax:  [phone]  Physical Therapy Treatment  Patient Details  Name: [name] MRN: [id number] Date of Birth: 1925/04/13 Referring Provider:  Hendricks Limes, MD  Encounter Date: 04/18/2015      PT End of Session - 04/18/15 1135    Visit Number 4   Number of Visits 10   Date for PT Re-Evaluation 06/03/15   PT Start Time 0852   PT Stop Time 0931   PT Time Calculation (min) 39 min   Activity Tolerance Patient tolerated treatment well   Behavior During Therapy [facility] for tasks assessed/performed      Past Medical History  Diagnosis Date  . Sliding hiatal hernia   . Erosive esophagitis   . Peptic stricture of esophagus   . Diverticulosis   . Arthritis   . Hypothyroidism   . Diabetes mellitus     Type I  . Coronary artery disease     s/p CABG 2001  . Sick sinus syndrome     s/p Medtronic DDD pacemaker implanted; generator change 02/2012  . Hypertension   . Hyperlipidemia   . GERD (gastroesophageal reflux disease)   . Adenomatous colon polyp 07/2002  . CVA (cerebral vascular accident)     Dr Evelena Leyden, Neurology  . Obesity     Past Surgical History  Procedure Laterality Date  . Pacemaker placement  03/25/2000    by Dr Olevia Perches  . Tonsillectomy and adenoidectomy    . Appendectomy    . Cholecystectomy    . Total abdominal hysterectomy w/ bilateral salpingoophorectomy      Endometiosis  . Replacement total knee  2004    Right  . Colonoscopy w/ polypectomy  2003  . Cataract extraction  05/2005  . Coronary artery bypass graft  02/2000    5 vessel  . Rotator cuff repair    . Pacemaker generator replacement  03/2012    Dr. Rayann Heman  . Permanent pacemaker generator change  03/18/2012    Procedure: PERMANENT PACEMAKER GENERATOR CHANGE;  Surgeon: Thompson Grayer, MD;  Location: [facility] CATH LAB;  Service: Cardiovascular;;    There were no vitals  filed for this visit.  Visit Diagnosis:  Abnormality of gait  Weakness of both legs  Gait instability      Subjective Assessment - 04/18/15 0901    Subjective Left knee is "doing better" on the bike today. States that she feels stronger in her legs   Currently in Pain? Other (Comment)  Did not perform pain assessment today                          [facility] Adult PT Treatment/Exercise - 04/18/15 0001    Knee/Hip Exercises: Aerobic   Stationary Bike 8 min. with assistance to keep feet on pedal  able to keep her feet on the pedal by herself   Knee/Hip Exercises: Standing   Heel Raises 2 sets;10 reps  20 second rest break between sets   Other Standing Knee Exercises Marching with UE supportx3; 30 secx3   Knee/Hip Exercises: Seated   Long Probation officer   Long Arc Quad Weight 2 lbs.   Long Arc Quad Limitations VC to keep Lt hip down on chair   Other Seated Knee Exercises seated rocker board 3 min withVC to move through the motion   Other Seated Knee Exercises  hip abd with  red band 30x;VC required to keep feet on the ground                                                                                                                                                                                                                                                                          PT Short Term Goals - 04/16/15 0909    PT SHORT TERM GOAL #1   Title be independent in initial HEP   Time 4   Period Weeks   Status On-going  continues to need VC   PT SHORT TERM GOAL #2   Title ambulate safely with a rolling walker to improve independence at home   Time 4   Period Weeks   Status On-going  working on script to get walker   PT SHORT TERM GOAL #3   Title perform TUG in < or = to 40 seconds to improve safety   Time 4   Period Weeks   Status On-going  continues to walk very slowly and holds onto items           PT Long Term Goals - 04/08/15  0927    PT LONG TERM GOAL #1   Title be independent in advanced HEP   Time 8   Period Weeks   Status New   PT LONG TERM GOAL #2   Title reduce FOTO to < or = to 55% limitation   Time 8   Period Weeks   Status New   PT LONG TERM GOAL #3   Title perform TUG in < or = to 35 seconds to improve safety   Time 8   Period Weeks   Status New   PT LONG TERM GOAL #4   Title improve LE strength to perform sit to stand with moderate UE support   Time 8   Period Weeks   Status New   PT LONG TERM GOAL #5   Title report a 25% reduction in low back pain with standing tasks   Time 8   Period Weeks   Status New               Plan - 04/18/15 1136    Clinical Impression  Statement Has received order for RW in place. Able to complete exercises  but requires 30 second rest break between sets due to fatigue. Requires moderate cueing to perform exercises appropriately. Pt will be benefit from skilled PT for ambulation, endurance , balance and LE strenghtening.    Pt will benefit from skilled therapeutic intervention in order to improve on the following deficits Abnormal gait;Decreased coordination;Difficulty walking;Decreased safety awareness;Decreased endurance;Decreased activity tolerance;Pain;Decreased balance;Decreased strength   Rehab Potential Good   PT Frequency 2x / week   PT Duration 8 weeks   PT Treatment/Interventions ADLs/Self Care Home Management;Moist Heat;Therapeutic activities;Patient/family education;Therapeutic exercise;Ultrasound;Manual techniques;Balance training;Functional mobility training;Cryotherapy;Neuromuscular re-education   PT Next Visit Plan More exercises in standing; weight shifting on mini tramp, ambulation/gait practice (obstacle course)    Consulted and Agree with Plan of Care Patient        Problem List Patient Active Problem List   Diagnosis Date Noted  . Memory loss 04/05/2014  . Type II or unspecified type diabetes mellitus with neurological  manifestations, uncontrolled 01/24/2014  . Chronic renal insufficiency, stage II (mild) 03/21/2013  . Encounter for long-term (current) use of other medications 07/29/2012  . Diabetes 04/28/2012  . CVA (cerebral vascular accident)   . EDEMA- LOCALIZED 01/06/2011  . Chronic systolic heart failure 75/91/6384  . GERD 04/30/2010  . TRANSIENT ISCHEMIC ATTACKS, HX OF 04/30/2010  . DYSPEPSIA 04/14/2010  . WEAKNESS 03/19/2010  . CONSTIPATION 02/13/2010  . ANEMIA, MILD 01/14/2010  . HYPERLIPIDEMIA-MIXED 09/16/2008  . HYPERTENSION, BENIGN 09/16/2008  . CAD, AUTOLOGOUS BYPASS GRAFT 09/16/2008  . SICK SINUS/ TACHY-BRADY SYNDROME 09/16/2008  . PACEMAKER, PERMANENT 09/16/2008  . CAD 10/25/2007  . Hypothyroidism 06/14/2007  . COLONIC POLYPS, HX OF 03/23/2007    Reginal Lutes, SPT 04/18/2015 12:47 PM  [facility] [address] [place], [place], [address] Phone: [phone]   Fax:  [phone]

## 2015-04-23 ENCOUNTER — Telehealth: Payer: Self-pay | Admitting: Cardiology

## 2015-04-23 ENCOUNTER — Ambulatory Visit: Payer: Medicare Other | Admitting: Physical Therapy

## 2015-04-23 ENCOUNTER — Encounter: Payer: Self-pay | Admitting: Internal Medicine

## 2015-04-23 ENCOUNTER — Ambulatory Visit (INDEPENDENT_AMBULATORY_CARE_PROVIDER_SITE_OTHER): Payer: Medicare Other | Admitting: *Deleted

## 2015-04-23 DIAGNOSIS — R2681 Unsteadiness on feet: Secondary | ICD-10-CM | POA: Diagnosis not present

## 2015-04-23 DIAGNOSIS — R269 Unspecified abnormalities of gait and mobility: Secondary | ICD-10-CM

## 2015-04-23 DIAGNOSIS — M4806 Spinal stenosis, lumbar region: Secondary | ICD-10-CM | POA: Diagnosis not present

## 2015-04-23 DIAGNOSIS — M170 Bilateral primary osteoarthritis of knee: Secondary | ICD-10-CM | POA: Diagnosis not present

## 2015-04-23 DIAGNOSIS — I495 Sick sinus syndrome: Secondary | ICD-10-CM | POA: Diagnosis not present

## 2015-04-23 DIAGNOSIS — R29898 Other symptoms and signs involving the musculoskeletal system: Secondary | ICD-10-CM

## 2015-04-23 NOTE — Telephone Encounter (Signed)
Spoke w/ pt caregiver and confirmed remote transmission.

## 2015-04-23 NOTE — Therapy (Signed)
[facility] [address], [place], [place], [address] Phone: [phone]   Fax:  [phone]  Physical Therapy Treatment  Patient Details  Name: [name] MRN: [id number] Date of Birth: Feb 02, 1925 Referring Provider:  Hendricks Limes, MD  Encounter Date: 04/23/2015      PT End of Session - 04/23/15 0856    Visit Number 5   Number of Visits 10   Date for PT Re-Evaluation 06/03/15   PT Start Time 7510   PT Stop Time 0933   PT Time Calculation (min) 39 min   Equipment Utilized During Treatment Gait belt   Activity Tolerance Patient tolerated treatment well      Past Medical History  Diagnosis Date  . Sliding hiatal hernia   . Erosive esophagitis   . Peptic stricture of esophagus   . Diverticulosis   . Arthritis   . Hypothyroidism   . Diabetes mellitus     Type I  . Coronary artery disease     s/p CABG 2001  . Sick sinus syndrome     s/p Medtronic DDD pacemaker implanted; generator change 02/2012  . Hypertension   . Hyperlipidemia   . GERD (gastroesophageal reflux disease)   . Adenomatous colon polyp 07/2002  . CVA (cerebral vascular accident)     Dr Evelena Leyden, Neurology  . Obesity     Past Surgical History  Procedure Laterality Date  . Pacemaker placement  03/25/2000    by Dr Olevia Perches  . Tonsillectomy and adenoidectomy    . Appendectomy    . Cholecystectomy    . Total abdominal hysterectomy w/ bilateral salpingoophorectomy      Endometiosis  . Replacement total knee  2004    Right  . Colonoscopy w/ polypectomy  2003  . Cataract extraction  05/2005  . Coronary artery bypass graft  02/2000    5 vessel  . Rotator cuff repair    . Pacemaker generator replacement  03/2012    Dr. Rayann Heman  . Permanent pacemaker generator change  03/18/2012    Procedure: PERMANENT PACEMAKER GENERATOR CHANGE;  Surgeon: Thompson Grayer, MD;  Location: [facility] CATH LAB;  Service: Cardiovascular;;    There were no vitals filed for  this visit.  Visit Diagnosis:  Abnormality of gait  Weakness of both legs  Gait instability      Subjective Assessment - 04/23/15 0857    Subjective Patient reports compliance with HEP. Early in the morning is the worst time.   Currently in Pain? Yes   Pain Score 8    Pain Location Knee   Pain Orientation Left   Pain Descriptors / Indicators Aching   Pain Type Chronic pain   Pain Onset More than a month ago   Aggravating Factors  bending left knee   Pain Relieving Factors straightening knee   Effect of Pain on Daily Activities limited standing   Multiple Pain Sites No                         [facility] Adult PT Treatment/Exercise - 04/23/15 0001    Ambulation/Gait   Ambulation/Gait Yes   Ambulation/Gait Assistance 4: Min assist   Ambulation/Gait Assistance Details VCs for keeping head up and using heel/toe gait   Ambulation Distance (Feet) 80 Feet   Assistive device Small based quad cane   Ambulation Surface Level   Knee/Hip Exercises: Aerobic   Stationary Bike 8 min.  able to keep her feet  on the pedal by herself   Knee/Hip Exercises: Standing   Stairs Up/down 4 steps step to gait with CGA; then up/down 2x with recipriocal gait and CGA  patient internially rotates Rt hip when descending with LLE   Other Standing Knee Exercises Trampoline: marching; heel raises; weight shifting 1 min each.   Knee/Hip Exercises: Seated   Long Arc Quad Strengthening;1 set;10 reps  3 sec hold; bil   Long Arc Quad Weight 2 lbs.   Long [employer] Limitations patient kept left hip down without cues   Other Seated Knee Exercises hip abd x 30 red banc                  PT Short Term Goals - 04/16/15 0909    PT SHORT TERM GOAL #1   Title be independent in initial HEP   Time 4   Period Weeks   Status On-going  continues to need VC   PT SHORT TERM GOAL #2   Title ambulate safely with a rolling walker to improve independence at home   Time 4   Period Weeks   Status  On-going  working on script to get walker   PT SHORT TERM GOAL #3   Title perform TUG in < or = to 40 seconds to improve safety   Time 4   Period Weeks   Status On-going  continues to walk very slowly and holds onto items           PT Long Term Goals - 04/08/15 0927    PT LONG TERM GOAL #1   Title be independent in advanced HEP   Time 8   Period Weeks   Status New   PT LONG TERM GOAL #2   Title reduce FOTO to < or = to 55% limitation   Time 8   Period Weeks   Status New   PT LONG TERM GOAL #3   Title perform TUG in < or = to 35 seconds to improve safety   Time 8   Period Weeks   Status New   PT LONG TERM GOAL #4   Title improve LE strength to perform sit to stand with moderate UE support   Time 8   Period Weeks   Status New   PT LONG TERM GOAL #5   Title report a 25% reduction in low back pain with standing tasks   Time 8   Period Weeks   Status New               Plan - 04/23/15 2202    Clinical Impression Statement Patient did well with therex today. Only one break was taken which was after gait training. Able to tolerate increased holds with exercises and did not have increased pain with TE. continues to require VCs with gait for heel strike and looking forward instead of down.    PT Next Visit Plan TUG at start; exercises in standing, continue gait traiing        Problem List Patient Active Problem List   Diagnosis Date Noted  . Memory loss 04/05/2014  . Type II or unspecified type diabetes mellitus with neurological manifestations, uncontrolled 01/24/2014  . Chronic renal insufficiency, stage II (mild) 03/21/2013  . Encounter for long-term (current) use of other medications 07/29/2012  . Diabetes 04/28/2012  . CVA (cerebral vascular accident)   . EDEMA- LOCALIZED 01/06/2011  . Chronic systolic heart failure 54/27/0623  . GERD 04/30/2010  . TRANSIENT ISCHEMIC ATTACKS, HX  OF 04/30/2010  . DYSPEPSIA 04/14/2010  . WEAKNESS 03/19/2010  .  CONSTIPATION 02/13/2010  . ANEMIA, MILD 01/14/2010  . HYPERLIPIDEMIA-MIXED 09/16/2008  . HYPERTENSION, BENIGN 09/16/2008  . CAD, AUTOLOGOUS BYPASS GRAFT 09/16/2008  . SICK SINUS/ TACHY-BRADY SYNDROME 09/16/2008  . PACEMAKER, PERMANENT 09/16/2008  . CAD 10/25/2007  . Hypothyroidism 06/14/2007  . COLONIC POLYPS, HX OF 03/23/2007    Madelyn Flavors PT  04/23/2015, 9:52 AM  [facility] [address], [place], [place], [address] Phone: [phone]   Fax:  [phone]

## 2015-04-24 ENCOUNTER — Ambulatory Visit: Payer: Medicare Other | Admitting: Endocrinology

## 2015-04-24 NOTE — Progress Notes (Signed)
Remote pacemaker transmission.   

## 2015-04-25 ENCOUNTER — Ambulatory Visit: Payer: Medicare Other | Attending: Internal Medicine | Admitting: Physical Therapy

## 2015-04-25 DIAGNOSIS — R2681 Unsteadiness on feet: Secondary | ICD-10-CM | POA: Diagnosis not present

## 2015-04-25 DIAGNOSIS — R269 Unspecified abnormalities of gait and mobility: Secondary | ICD-10-CM

## 2015-04-25 DIAGNOSIS — R29898 Other symptoms and signs involving the musculoskeletal system: Secondary | ICD-10-CM | POA: Insufficient documentation

## 2015-04-25 NOTE — Patient Instructions (Signed)
Bridge   Lie back, legs bent. Inhale, pressing hips up. Keeping ribs in, lengthen lower back. Exhale, rolling down along spine from top. Repeat 10-30____ times. Do __1__ sessions per day.  DON'T PLOP BACK DOWN.  Copyright  VHI. All rights reserved.   Madelyn Flavors, PT 04/25/2015 9:26 AM [facility] [address] [place], [place] [address] Phone # [phone] Fax [phone]

## 2015-04-25 NOTE — Therapy (Signed)
[facility] [address] [place], [place], [address] Phone: [phone]   Fax:  [phone]  Physical Therapy Treatment  Patient Details  Name: [name] MRN: [id number] Date of Birth: 1925-03-28 Referring Provider:  Hendricks Limes, MD  Encounter Date: 04/25/2015      PT End of Session - 04/25/15 0851    Visit Number 6   Date for PT Re-Evaluation 06/03/15   PT Start Time 0846   PT Stop Time 0929   PT Time Calculation (min) 43 min   Equipment Utilized During Treatment Gait belt   Activity Tolerance Patient tolerated treatment well;No increased pain      Past Medical History  Diagnosis Date  . Sliding hiatal hernia   . Erosive esophagitis   . Peptic stricture of esophagus   . Diverticulosis   . Arthritis   . Hypothyroidism   . Diabetes mellitus     Type I  . Coronary artery disease     s/p CABG 2001  . Sick sinus syndrome     s/p Medtronic DDD pacemaker implanted; generator change 02/2012  . Hypertension   . Hyperlipidemia   . GERD (gastroesophageal reflux disease)   . Adenomatous colon polyp 07/2002  . CVA (cerebral vascular accident)     Dr Evelena Leyden, Neurology  . Obesity     Past Surgical History  Procedure Laterality Date  . Pacemaker placement  03/25/2000    by Dr Olevia Perches  . Tonsillectomy and adenoidectomy    . Appendectomy    . Cholecystectomy    . Total abdominal hysterectomy w/ bilateral salpingoophorectomy      Endometiosis  . Replacement total knee  2004    Right  . Colonoscopy w/ polypectomy  2003  . Cataract extraction  05/2005  . Coronary artery bypass graft  02/2000    5 vessel  . Rotator cuff repair    . Pacemaker generator replacement  03/2012    Dr. Rayann Heman  . Permanent pacemaker generator change  03/18/2012    Procedure: PERMANENT PACEMAKER GENERATOR CHANGE;  Surgeon: Thompson Grayer, MD;  Location: [facility] CATH LAB;  Service: Cardiovascular;;    There were no vitals filed for this  visit.  Visit Diagnosis:  Abnormality of gait  Weakness of both legs  Gait instability      Subjective Assessment - 04/25/15 0857    Subjective Left knee fine when sitting, but hurts when I get it moving.   How long can you stand comfortably? able to stand 1.5 hours cooking the other day   Patient Stated Goals improve gait and balance   Currently in Pain? Yes   Pain Score 7    Pain Location Knee   Pain Orientation Left   Pain Descriptors / Indicators Aching   Pain Type Chronic pain   Pain Onset More than a month ago   Pain Frequency Intermittent   Aggravating Factors  bending left knee   Pain Relieving Factors straightening knee   Effect of Pain on Daily Activities limited walking            [facility] PT Assessment - 04/25/15 0001    Assessment   Medical Diagnosis spinal stenosis (M48.06), lumbar region with neurogenic claudication. Primary OA of both knees(M17.0)   Timed Up and Go Test   TUG Normal TUG   Normal TUG (seconds) 42  and 38  [facility] Adult PT Treatment/Exercise - 04/25/15 0001    Ambulation/Gait   Ambulation/Gait Yes   Ambulation/Gait Assistance 4: Min guard   Ambulation/Gait Assistance Details VCs for increased DF bil;   Ambulation Distance (Feet) 80 Feet   Ambulation Surface Level   Knee/Hip Exercises: Aerobic   Stationary Bike 8 min.  able to keep her feet on the pedal by herself   Knee/Hip Exercises: Standing   Heel Raises 1 set  x 7 hand hold A; 1 x 10   Other Standing Knee Exercises sit to stand 2x10  patient does well after multiple reps.   Knee/Hip Exercises: Seated   Long Arc Quad Strengthening;Both;2 sets;10 reps   Long Arc Quad Weight 3 lbs.   Other Seated Knee Exercises resisted DF with red band x 20 bil   Other Seated Knee Exercises hip abd x 30 green band   Knee/Hip Exercises: Supine   Bridges 3 sets;10 reps  VCs to control on way down   Straight Leg Raises Both;1 set;10 reps                 PT Education - 04/25/15 0942    Education provided Yes   Education Details [employer]) Educated Patient   Methods Explanation;Demonstration;Handout   Comprehension Verbalized understanding;Returned demonstration          PT Short Term Goals - 04/25/15 0949    PT SHORT TERM GOAL #1   Title be independent in initial HEP   Time 4   Period Weeks   Status On-going   PT SHORT TERM GOAL #2   Title ambulate safely with a rolling walker to improve independence at home   Time 4   Status On-going   PT SHORT TERM GOAL #3   Title perform TUG in < or = to 40 seconds to improve safety   Time 4   Period Weeks   Status Achieved           PT Long Term Goals - 04/08/15 7564    PT LONG TERM GOAL #1   Title be independent in advanced HEP   Time 8   Period Weeks   Status New   PT LONG TERM GOAL #2   Title reduce FOTO to < or = to 55% limitation   Time 8   Period Weeks   Status New   PT LONG TERM GOAL #3   Title perform TUG in < or = to 35 seconds to improve safety   Time 8   Period Weeks   Status New   PT LONG TERM GOAL #4   Title improve LE strength to perform sit to stand with moderate UE support   Time 8   Period Weeks   Status New   PT LONG TERM GOAL #5   Title report a 25% reduction in low back pain with standing tasks   Time 8   Period Weeks   Status New               Plan - 04/25/15 3329    Clinical Impression Statement Patient is progressing with goals. She has improved on her TUG by over 10 seconds and is progressing with strengthening. She continues to require VCs to perform heel strike with gait but her endurance with walking and exercise in general is improving.  She prefers to ambulate witout her small quad cane, but it is unsafe without supervision. She is in the process of getting a walker. She continues  to demo functional weakness in quads with prolonged standing activities and will continue to benefit from  PT to gain strength and function.    PT Next Visit Plan continue gait, sit to stand, LE strength, balance activities   Consulted and Agree with Plan of Care Patient        Problem List Patient Active Problem List   Diagnosis Date Noted  . Memory loss 04/05/2014  . Type II or unspecified type diabetes mellitus with neurological manifestations, uncontrolled 01/24/2014  . Chronic renal insufficiency, stage II (mild) 03/21/2013  . Encounter for long-term (current) use of other medications 07/29/2012  . Diabetes 04/28/2012  . CVA (cerebral vascular accident)   . EDEMA- LOCALIZED 01/06/2011  . Chronic systolic heart failure 00/76/2263  . GERD 04/30/2010  . TRANSIENT ISCHEMIC ATTACKS, HX OF 04/30/2010  . DYSPEPSIA 04/14/2010  . WEAKNESS 03/19/2010  . CONSTIPATION 02/13/2010  . ANEMIA, MILD 01/14/2010  . HYPERLIPIDEMIA-MIXED 09/16/2008  . HYPERTENSION, BENIGN 09/16/2008  . CAD, AUTOLOGOUS BYPASS GRAFT 09/16/2008  . SICK SINUS/ TACHY-BRADY SYNDROME 09/16/2008  . PACEMAKER, PERMANENT 09/16/2008  . CAD 10/25/2007  . Hypothyroidism 06/14/2007  . COLONIC POLYPS, HX OF 03/23/2007    Madelyn Flavors PT  04/25/2015, 9:52 AM  [facility] [address], [place], [place], [address] Phone: [phone]   Fax:  [phone]

## 2015-04-28 LAB — CUP PACEART REMOTE DEVICE CHECK
Battery Remaining Longevity: 127 mo
Battery Voltage: 2.78 V
Brady Statistic AP VS Percent: 99 %
Brady Statistic AS VP Percent: 0 %
Brady Statistic AS VS Percent: 1 %
Date Time Interrogation Session: 20160531190018
Lead Channel Impedance Value: 398 Ohm
Lead Channel Impedance Value: 781 Ohm
Lead Channel Pacing Threshold Amplitude: 0.625 V
Lead Channel Pacing Threshold Amplitude: 1.375 V
Lead Channel Pacing Threshold Pulse Width: 0.4 ms
Lead Channel Pacing Threshold Pulse Width: 0.4 ms
Lead Channel Setting Pacing Amplitude: 2 V
Lead Channel Setting Pacing Amplitude: 2.75 V
Lead Channel Setting Pacing Pulse Width: 0.4 ms
Lead Channel Setting Sensing Sensitivity: 5.6 mV
MDC IDC MSMT BATTERY IMPEDANCE: 183 Ohm
MDC IDC MSMT LEADCHNL RV SENSING INTR AMPL: 11.2 mV
MDC IDC STAT BRADY AP VP PERCENT: 0 %

## 2015-04-29 ENCOUNTER — Ambulatory Visit: Payer: Medicare Other | Admitting: Podiatry

## 2015-04-30 ENCOUNTER — Ambulatory Visit: Payer: Medicare Other

## 2015-04-30 DIAGNOSIS — R2681 Unsteadiness on feet: Secondary | ICD-10-CM | POA: Diagnosis not present

## 2015-04-30 DIAGNOSIS — R269 Unspecified abnormalities of gait and mobility: Secondary | ICD-10-CM | POA: Diagnosis not present

## 2015-04-30 DIAGNOSIS — R29898 Other symptoms and signs involving the musculoskeletal system: Secondary | ICD-10-CM | POA: Diagnosis not present

## 2015-04-30 NOTE — Therapy (Addendum)
[facility] [address], [place], [place], [address] Phone: [phone]   Fax:  [phone]  Physical Therapy Treatment  Patient Details  Name: [name] MRN: [id number] Date of Birth: [birth year] Referring Provider:  Hendricks Limes, MD  Encounter Date: 04/30/2015      PT End of Session - 04/30/15 0855    Visit Number 7   Number of Visits 10   Date for PT Re-Evaluation 06/03/15   PT Start Time 0846   PT Stop Time 0932   PT Time Calculation (min) 46 min   Activity Tolerance Patient tolerated treatment well;No increased pain   Behavior During Therapy [facility] for tasks assessed/performed      Past Medical History  Diagnosis Date  . Sliding hiatal hernia   . Erosive esophagitis   . Peptic stricture of esophagus   . Diverticulosis   . Arthritis   . Hypothyroidism   . Diabetes mellitus     Type I  . Coronary artery disease     s/p CABG 2001  . Sick sinus syndrome     s/p Medtronic DDD pacemaker implanted; generator change 02/2012  . Hypertension   . Hyperlipidemia   . GERD (gastroesophageal reflux disease)   . Adenomatous colon polyp 07/2002  . CVA (cerebral vascular accident)     Dr Evelena Leyden, Neurology  . Obesity     Past Surgical History  Procedure Laterality Date  . Pacemaker placement  03/25/2000    by Dr Olevia Perches  . Tonsillectomy and adenoidectomy    . Appendectomy    . Cholecystectomy    . Total abdominal hysterectomy w/ bilateral salpingoophorectomy      Endometiosis  . Replacement total knee  2004    Right  . Colonoscopy w/ polypectomy  2003  . Cataract extraction  05/2005  . Coronary artery bypass graft  02/2000    5 vessel  . Rotator cuff repair    . Pacemaker generator replacement  03/2012    Dr. Rayann Heman  . Permanent pacemaker generator change  03/18/2012    Procedure: PERMANENT PACEMAKER GENERATOR CHANGE;  Surgeon: Thompson Grayer, MD;  Location: [facility] CATH LAB;  Service: Cardiovascular;;     There were no vitals filed for this visit.  Visit Diagnosis:  Abnormality of gait  Weakness of both legs  Gait instability      Subjective Assessment - 04/30/15 0855    Subjective Continues to have knee pain with movement. Feels like she is doing better and "walking from her heels" better. Will be getting RW tomorrow. Performs "her own exercises" at home; describes lifting legs while in bed.   Pertinent History Rt TKA, CVA   How long can you walk comfortably? Has discomfort in knee and feels like she gets off balance when it is painful; does not walk for long distances.    Currently in Pain? Yes   Pain Score 7    Pain Location Knee   Pain Orientation Left   Pain Descriptors / Indicators Stabbing   Pain Type Chronic pain   Pain Onset More than a month ago   Pain Frequency Intermittent   Aggravating Factors  Walking, bending knee,    Pain Relieving Factors Pain medication (Aleve), decreasing activity    Effect of Pain on Daily Activities Limited walking, standing                          [facility] Adult PT Treatment/Exercise -  04/30/15 0001    Knee/Hip Exercises: Aerobic   Stationary Bike 8 min.  mod assist to place R foot on pedal   Knee/Hip Exercises: Standing   Other Standing Knee Exercises Marching along counter (14 ft) x 6; one UE support   verbal/visual cue to increase knee flexion    Other Standing Knee Exercises Crossover exercise; 15x3; 1 UE support    Knee/Hip Exercises: Seated   Long [employer]   Long Arc Quad Weight 3 lbs.   Other Seated Knee Exercises sit to stand from chair with blue foam pad (20 in from floor); no UE support; 3x10   Did 5 reps at a time; VC to demonstrate control with sittin                  PT Short Term Goals - 04/25/15 0949    PT SHORT TERM GOAL #1   Title be independent in initial HEP   Time 4   Period Weeks   Status On-going   PT SHORT TERM GOAL #2   Title ambulate safely with a rolling  walker to improve independence at home   Time 4   Status On-going   PT SHORT TERM GOAL #3   Title perform TUG in < or = to 40 seconds to improve safety   Time 4   Period Weeks   Status Achieved           PT Long Term Goals - 04/30/15 0946    PT LONG TERM GOAL #4   Title improve LE strength to perform sit to stand with moderate UE support  Able to rise from raised seat without UE support; continues to require min/mod UE assist  at home for sit to stand    Time 8   Period Weeks   Status On-going               Plan - 04/30/15 7893    Clinical Impression Statement Pt demonstrated increased endurance for LE functional exercises today requiring less frequent rest breaks with activity. Continues to ambulate with a 3 pt step-to pattern with quad cane and self-corrects for ambulating with increased DF. Will be receiving a RW soon.  Reports that Lt knee pain decreases with activity. Pt wil continue from skilled PT for continued LE strengthening and ambulation/gait practice with RW.     Pt will benefit from skilled therapeutic intervention in order to improve on the following deficits Abnormal gait;Decreased coordination;Difficulty walking;Decreased safety awareness;Decreased endurance;Decreased activity tolerance;Pain;Decreased balance;Decreased strength   Rehab Potential Good   PT Frequency 2x / week   PT Duration 8 weeks   PT Treatment/Interventions ADLs/Self Care Home Management;Moist Heat;Therapeutic activities;Patient/family education;Therapeutic exercise;Ultrasound;Manual techniques;Balance training;Functional mobility training;Cryotherapy;Neuromuscular re-education   PT Next Visit Plan continue gait, sit to stand, LE strength, balance activities on foam pad, review HEP. TUG to address LTG   Consulted and Agree with Plan of Care Patient        Problem List Patient Active Problem List   Diagnosis Date Noted  . Memory loss 04/05/2014  . Type II or unspecified type diabetes  mellitus with neurological manifestations, uncontrolled 01/24/2014  . Chronic renal insufficiency, stage II (mild) 03/21/2013  . Encounter for long-term (current) use of other medications 07/29/2012  . Diabetes 04/28/2012  . CVA (cerebral vascular accident)   . EDEMA- LOCALIZED 01/06/2011  . Chronic systolic heart failure 81/11/7508  . GERD 04/30/2010  . TRANSIENT ISCHEMIC ATTACKS, HX OF 04/30/2010  . DYSPEPSIA 04/14/2010  .  WEAKNESS 03/19/2010  . CONSTIPATION 02/13/2010  . ANEMIA, MILD 01/14/2010  . HYPERLIPIDEMIA-MIXED 09/16/2008  . HYPERTENSION, BENIGN 09/16/2008  . CAD, AUTOLOGOUS BYPASS GRAFT 09/16/2008  . SICK SINUS/ TACHY-BRADY SYNDROME 09/16/2008  . PACEMAKER, PERMANENT 09/16/2008  . CAD 10/25/2007  . Hypothyroidism 06/14/2007  . COLONIC POLYPS, HX OF 03/23/2007   Reginal Lutes, SPT 04/30/2015 12:37 PM  [facility] [address], [place], [place], [address] Phone: [phone]   Fax:  [phone]

## 2015-05-01 ENCOUNTER — Ambulatory Visit: Payer: Medicare Other

## 2015-05-02 ENCOUNTER — Ambulatory Visit: Payer: Medicare Other | Admitting: Physical Therapy

## 2015-05-02 ENCOUNTER — Encounter: Payer: Self-pay | Admitting: Physical Therapy

## 2015-05-02 DIAGNOSIS — R29898 Other symptoms and signs involving the musculoskeletal system: Secondary | ICD-10-CM | POA: Diagnosis not present

## 2015-05-02 DIAGNOSIS — R2681 Unsteadiness on feet: Secondary | ICD-10-CM | POA: Diagnosis not present

## 2015-05-02 DIAGNOSIS — R269 Unspecified abnormalities of gait and mobility: Secondary | ICD-10-CM | POA: Diagnosis not present

## 2015-05-02 NOTE — Therapy (Signed)
[facility] [address], [place], [place], [address] Phone: [phone]   Fax:  [phone]  Physical Therapy Treatment  Patient Details  Name: [name] MRN: [id number] Date of Birth: 07/03/1925 Referring Provider:  Hendricks Limes, MD  Encounter Date: 05/02/2015      PT End of Session - 05/02/15 1015    Visit Number 8   Number of Visits 10   Date for PT Re-Evaluation 06/03/15   PT Start Time 0935   PT Stop Time 1017   PT Time Calculation (min) 42 min   Equipment Utilized During Treatment Other (comment)  4wheel walker   Activity Tolerance Patient tolerated treatment well;No increased pain   Behavior During Therapy [facility] for tasks assessed/performed      Past Medical History  Diagnosis Date  . Sliding hiatal hernia   . Erosive esophagitis   . Peptic stricture of esophagus   . Diverticulosis   . Arthritis   . Hypothyroidism   . Diabetes mellitus     Type I  . Coronary artery disease     s/p CABG 2001  . Sick sinus syndrome     s/p Medtronic DDD pacemaker implanted; generator change 02/2012  . Hypertension   . Hyperlipidemia   . GERD (gastroesophageal reflux disease)   . Adenomatous colon polyp 07/2002  . CVA (cerebral vascular accident)     Dr Evelena Leyden, Neurology  . Obesity     Past Surgical History  Procedure Laterality Date  . Pacemaker placement  03/25/2000    by Dr Olevia Perches  . Tonsillectomy and adenoidectomy    . Appendectomy    . Cholecystectomy    . Total abdominal hysterectomy w/ bilateral salpingoophorectomy      Endometiosis  . Replacement total knee  2004    Right  . Colonoscopy w/ polypectomy  2003  . Cataract extraction  05/2005  . Coronary artery bypass graft  02/2000    5 vessel  . Rotator cuff repair    . Pacemaker generator replacement  03/2012    Dr. Rayann Heman  . Permanent pacemaker generator change  03/18/2012    Procedure: PERMANENT PACEMAKER GENERATOR CHANGE;  Surgeon: Thompson Grayer, MD;  Location: [facility] CATH LAB;  Service: Cardiovascular;;    There were no vitals filed for this visit.  Visit Diagnosis:  Abnormality of gait  Weakness of both legs  Gait instability      Subjective Assessment - 05/02/15 0946    Subjective Continues to complain of left knee pain. Pt arrived with new [place], pt still with unsteady gait.    Patient is accompained by: Family member  son in law   Pertinent History Rt TKA, CVA   Limitations Standing   Currently in Pain? Yes   Pain Score 7    Pain Location Knee   Pain Orientation Left   Pain Descriptors / Indicators Stabbing;Pressure  incr. with weightbearing   Pain Type Chronic pain   Pain Onset More than a month ago   Multiple Pain Sites No                         [facility] Adult PT Treatment/Exercise - 05/02/15 0001    Ambulation/Gait   Ambulation/Gait Yes   Ambulation/Gait Assistance 6: Modified independent (Device/Increase time)  practiced with new 4wheel walker, taking turns left & right    Ambulation Surface Level   Knee/Hip Exercises: Aerobic   Stationary Bike  8 min.  mod assist for foot placement on pedal   Knee/Hip Exercises: Standing   Other Standing Knee Exercises Marching along counter (14 ft) x 6; one UE support    Other Standing Knee Exercises ball squezzes 2x10 with 5 sec hold   Knee/Hip Exercises: Seated   Long Probation officer   Long Arc Quad Weight 3 lbs.   Long [employer] Limitations LE more challenging   Other Seated Knee Exercises sit to stand from chair with blue foam pad (20 in from floor); no UE support; 3x10   with slow decent for eccentric control   Other Seated Knee Exercises --                  PT Short Term Goals - 04/25/15 0949    PT SHORT TERM GOAL #1   Title be independent in initial HEP   Time 4   Period Weeks   Status On-going   PT SHORT TERM GOAL #2   Title ambulate safely with a rolling walker to improve independence at home   Time 4    Status On-going   PT SHORT TERM GOAL #3   Title perform TUG in < or = to 40 seconds to improve safety   Time 4   Period Weeks   Status Achieved           PT Long Term Goals - 04/30/15 0946    PT LONG TERM GOAL #4   Title improve LE strength to perform sit to stand with moderate UE support  Able to rise from raised seat without UE support; continues to require min/mod UE assist  at home for sit to stand    Time 8   Period Weeks   Status On-going               Plan - 05/02/15 1019    Clinical Impression Statement Pt with good control for transfers sit to stand. New 4wheel walker, pt need reminders for trunk extension and staying closer into 4 wheel walker   Pt will benefit from skilled therapeutic intervention in order to improve on the following deficits Abnormal gait;Decreased coordination;Difficulty walking;Decreased safety awareness;Decreased endurance;Decreased activity tolerance;Pain;Decreased balance;Decreased strength   Rehab Potential Good   PT Frequency 2x / week   PT Duration 8 weeks   PT Treatment/Interventions ADLs/Self Care Home Management;Moist Heat;Therapeutic activities;Patient/family education;Therapeutic exercise;Ultrasound;Manual techniques;Balance training;Functional mobility training;Cryotherapy;Neuromuscular re-education   PT Next Visit Plan continue gait, sit to stand, LE strength, balance activities on foam pad, review HEP. TUG to address LTG   PT Home Exercise Plan review HEP   Consulted and Agree with Plan of Care Patient        Problem List Patient Active Problem List   Diagnosis Date Noted  . Memory loss 04/05/2014  . Type II or unspecified type diabetes mellitus with neurological manifestations, uncontrolled 01/24/2014  . Chronic renal insufficiency, stage II (mild) 03/21/2013  . Encounter for long-term (current) use of other medications 07/29/2012  . Diabetes 04/28/2012  . CVA (cerebral vascular accident)   . EDEMA- LOCALIZED  01/06/2011  . Chronic systolic heart failure 23/53/6144  . GERD 04/30/2010  . TRANSIENT ISCHEMIC ATTACKS, HX OF 04/30/2010  . DYSPEPSIA 04/14/2010  . WEAKNESS 03/19/2010  . CONSTIPATION 02/13/2010  . ANEMIA, MILD 01/14/2010  . HYPERLIPIDEMIA-MIXED 09/16/2008  . HYPERTENSION, BENIGN 09/16/2008  . CAD, AUTOLOGOUS BYPASS GRAFT 09/16/2008  . SICK SINUS/ TACHY-BRADY SYNDROME 09/16/2008  . PACEMAKER, PERMANENT 09/16/2008  . CAD 10/25/2007  .  Hypothyroidism 06/14/2007  . COLONIC POLYPS, HX OF 03/23/2007    NAUMANN-HOUEGNIFIO,Jaime Dome  PTA  05/02/2015, 10:25 AM  [facility] [address] [place], [place], [address] Phone: [phone]   Fax:  [phone]

## 2015-05-04 ENCOUNTER — Other Ambulatory Visit: Payer: Self-pay | Admitting: Internal Medicine

## 2015-05-06 ENCOUNTER — Other Ambulatory Visit: Payer: Self-pay

## 2015-05-06 MED ORDER — PANTOPRAZOLE SODIUM 40 MG PO TBEC
40.0000 mg | DELAYED_RELEASE_TABLET | Freq: Every day | ORAL | Status: DC
Start: 2015-05-06 — End: 2015-12-05

## 2015-05-06 NOTE — Telephone Encounter (Signed)
protonix rx sent to pharm 

## 2015-05-07 ENCOUNTER — Ambulatory Visit: Payer: Medicare Other

## 2015-05-07 DIAGNOSIS — R29898 Other symptoms and signs involving the musculoskeletal system: Secondary | ICD-10-CM | POA: Diagnosis not present

## 2015-05-07 DIAGNOSIS — R269 Unspecified abnormalities of gait and mobility: Secondary | ICD-10-CM | POA: Diagnosis not present

## 2015-05-07 DIAGNOSIS — R2681 Unsteadiness on feet: Secondary | ICD-10-CM

## 2015-05-07 NOTE — Therapy (Signed)
[facility] [address] [place], [place], [address] Phone: [phone]   Fax:  [phone]  Physical Therapy Treatment  Patient Details  Name: [name] MRN: [id number] Date of Birth: Oct 18, 1925 Referring Provider:  Hendricks Limes, MD  Encounter Date: 05/07/2015      PT End of Session - 05/07/15 0945    Visit Number 9   Number of Visits 10   Date for PT Re-Evaluation 06/03/15   PT Start Time 0849   PT Stop Time 0929   PT Time Calculation (min) 40 min   Activity Tolerance Patient tolerated treatment well   Behavior During Therapy [facility] for tasks assessed/performed      Past Medical History  Diagnosis Date  . Sliding hiatal hernia   . Erosive esophagitis   . Peptic stricture of esophagus   . Diverticulosis   . Arthritis   . Hypothyroidism   . Diabetes mellitus     Type I  . Coronary artery disease     s/p CABG 2001  . Sick sinus syndrome     s/p Medtronic DDD pacemaker implanted; generator change 02/2012  . Hypertension   . Hyperlipidemia   . GERD (gastroesophageal reflux disease)   . Adenomatous colon polyp 07/2002  . CVA (cerebral vascular accident)     Dr Evelena Leyden, Neurology  . Obesity     Past Surgical History  Procedure Laterality Date  . Pacemaker placement  03/25/2000    by Dr Olevia Perches  . Tonsillectomy and adenoidectomy    . Appendectomy    . Cholecystectomy    . Total abdominal hysterectomy w/ bilateral salpingoophorectomy      Endometiosis  . Replacement total knee  2004    Right  . Colonoscopy w/ polypectomy  2003  . Cataract extraction  05/2005  . Coronary artery bypass graft  02/2000    5 vessel  . Rotator cuff repair    . Pacemaker generator replacement  03/2012    Dr. Rayann Heman  . Permanent pacemaker generator change  03/18/2012    Procedure: PERMANENT PACEMAKER GENERATOR CHANGE;  Surgeon: Thompson Grayer, MD;  Location: [facility] CATH LAB;  Service: Cardiovascular;;    There were no vitals  filed for this visit.  Visit Diagnosis:  Abnormality of gait  Weakness of both legs  Gait instability      Subjective Assessment - 05/07/15 0900    Subjective Therapy is helping me.  Pt is using 4 wheeled walker for all distances.     Currently in Pain? No/denies            [facility] PT Assessment - 05/07/15 0001    Observation/Other Assessments   Focus on Therapeutic Outcomes (FOTO)  50% limitation   Timed Up and Go Test   TUG Normal TUG   Normal TUG (seconds) 33  36                     [facility] Adult PT Treatment/Exercise - 05/07/15 0001    Exercises   Exercises Shoulder   Knee/Hip Exercises: Aerobic   Stationary Bike Level 0 x 6 minutes  mod assist for foot placement on pedal   Knee/Hip Exercises: Seated   Long Probation officer   Long Arc Quad Weight 3 lbs.   Other Seated Knee Exercises hip adduction with ball: 5" x 20   Shoulder Exercises: Seated   Horizontal ABduction Strengthening;Both;20 reps   Theraband Level (Shoulder Horizontal ABduction) Level 1 (Yellow)  PT Short Term Goals - 04/25/15 0949    PT SHORT TERM GOAL #1   Title be independent in initial HEP   Time 4   Period Weeks   Status On-going   PT SHORT TERM GOAL #2   Title ambulate safely with a rolling walker to improve independence at home   Time 4   Status On-going   PT SHORT TERM GOAL #3   Title perform TUG in < or = to 40 seconds to improve safety   Time 4   Period Weeks   Status Achieved           PT Long Term Goals - 05/07/15 0901    PT LONG TERM GOAL #1   Title be independent in advanced HEP   Time 8   Period Weeks   Status On-going  Pt is independent in current HEP   PT LONG TERM GOAL #2   Title reduce FOTO to < or = to 55% limitation   Status Achieved  50% limitation   PT LONG TERM GOAL #3   Title perform TUG in < or = to 35 seconds to improve safety   Status On-going   PT LONG TERM GOAL #4   Title improve LE strength to  perform sit to stand with moderate UE support   Status On-going  able to perform wiht moderate UE support from elevated surface   PT LONG TERM GOAL #5   Title report a 25% reduction in low back pain with standing tasks   Status Achieved  Pt denies any LBP               Plan - 05/07/15 0903    Clinical Impression Statement FOTO score is improved to 50% limitation and pt denies LBP over the past week with standing.  Pt demonstrates instability and LE weakness that limits ease with sit to stand and safety with gait.  Pt will benefit from continued PT for LE strength, endurance,  and balance training.     Pt will benefit from skilled therapeutic intervention in order to improve on the following deficits Abnormal gait;Decreased coordination;Difficulty walking;Decreased safety awareness;Decreased endurance;Decreased activity tolerance;Pain;Decreased balance;Decreased strength   Rehab Potential Good   PT Frequency 2x / week   PT Duration 8 weeks   PT Treatment/Interventions ADLs/Self Care Home Management;Moist Heat;Therapeutic activities;Patient/family education;Therapeutic exercise;Ultrasound;Manual techniques;Balance training;Functional mobility training;Cryotherapy;Neuromuscular re-education   PT Next Visit Plan G-codes and 10th visit progress note.  continue to address LE strength, endurance and balance.   Consulted and Agree with Plan of Care Patient        Problem List Patient Active Problem List   Diagnosis Date Noted  . Memory loss 04/05/2014  . Type II or unspecified type diabetes mellitus with neurological manifestations, uncontrolled 01/24/2014  . Chronic renal insufficiency, stage II (mild) 03/21/2013  . Encounter for long-term (current) use of other medications 07/29/2012  . Diabetes 04/28/2012  . CVA (cerebral vascular accident)   . EDEMA- LOCALIZED 01/06/2011  . Chronic systolic heart failure 73/53/2992  . GERD 04/30/2010  . TRANSIENT ISCHEMIC ATTACKS, HX OF  04/30/2010  . DYSPEPSIA 04/14/2010  . WEAKNESS 03/19/2010  . CONSTIPATION 02/13/2010  . ANEMIA, MILD 01/14/2010  . HYPERLIPIDEMIA-MIXED 09/16/2008  . HYPERTENSION, BENIGN 09/16/2008  . CAD, AUTOLOGOUS BYPASS GRAFT 09/16/2008  . SICK SINUS/ TACHY-BRADY SYNDROME 09/16/2008  . PACEMAKER, PERMANENT 09/16/2008  . CAD 10/25/2007  . Hypothyroidism 06/14/2007  . COLONIC POLYPS, HX OF 03/23/2007    TAKACS,KELLY, PT 05/07/2015, 9:46  AM  [facility] [address], [place], [place], [address] Phone: [phone]   Fax:  [phone]

## 2015-05-09 ENCOUNTER — Ambulatory Visit: Payer: Medicare Other

## 2015-05-09 DIAGNOSIS — R2681 Unsteadiness on feet: Secondary | ICD-10-CM

## 2015-05-09 DIAGNOSIS — R269 Unspecified abnormalities of gait and mobility: Secondary | ICD-10-CM | POA: Diagnosis not present

## 2015-05-09 DIAGNOSIS — R29898 Other symptoms and signs involving the musculoskeletal system: Secondary | ICD-10-CM | POA: Diagnosis not present

## 2015-05-09 NOTE — Therapy (Addendum)
[facility] [address] [place], [place], [address] Phone: [phone]   Fax:  [phone]  Physical Therapy Treatment  Patient Details  Name: [name] MRN: [id number] Date of Birth: [birth year] Referring Provider:  Hendricks Limes, MD  Encounter Date: 05/09/2015      PT End of Session - 05/09/15 1202    Visit Number 10   Number of Visits 20   Date for PT Re-Evaluation 06/03/15   PT Start Time 1102   PT Stop Time 1148   PT Time Calculation (min) 46 min   Equipment Utilized During Treatment Other (comment);Gait belt   Activity Tolerance Patient tolerated treatment well;Patient limited by fatigue   Behavior During Therapy [facility] for tasks assessed/performed      Past Medical History  Diagnosis Date  . Sliding hiatal hernia   . Erosive esophagitis   . Peptic stricture of esophagus   . Diverticulosis   . Arthritis   . Hypothyroidism   . Diabetes mellitus     Type I  . Coronary artery disease     s/p CABG 2001  . Sick sinus syndrome     s/p Medtronic DDD pacemaker implanted; generator change 02/2012  . Hypertension   . Hyperlipidemia   . GERD (gastroesophageal reflux disease)   . Adenomatous colon polyp 07/2002  . CVA (cerebral vascular accident)     Dr Evelena Leyden, Neurology  . Obesity     Past Surgical History  Procedure Laterality Date  . Pacemaker placement  03/25/2000    by Dr Olevia Perches  . Tonsillectomy and adenoidectomy    . Appendectomy    . Cholecystectomy    . Total abdominal hysterectomy w/ bilateral salpingoophorectomy      Endometiosis  . Replacement total knee  2004    Right  . Colonoscopy w/ polypectomy  2003  . Cataract extraction  05/2005  . Coronary artery bypass graft  02/2000    5 vessel  . Rotator cuff repair    . Pacemaker generator replacement  03/2012    Dr. Rayann Heman  . Permanent pacemaker generator change  03/18/2012    Procedure: PERMANENT PACEMAKER GENERATOR CHANGE;  Surgeon:  Thompson Grayer, MD;  Location: [facility] CATH LAB;  Service: Cardiovascular;;    There were no vitals filed for this visit.  Visit Diagnosis:  Abnormality of gait  Weakness of both legs  Gait instability      Subjective Assessment - 05/09/15 1108    Subjective Had a fall this morning; was going to the bathroom and then phone rang and went to get it and slipped. Was not using RW at this time.    Patient is accompained by: Family member   Pertinent History Rt TKA, CVA   Limitations Standing   Currently in Pain? Yes   Pain Score 5    Pain Location Knee   Pain Orientation Left   Pain Descriptors / Indicators Aching;Dull   Aggravating Factors  Activity    Pain Relieving Factors Ice, pain             [facility] PT Assessment - 05/09/15 0001    Assessment   Medical Diagnosis spinal stenosis (M48.06), lumbar region with neurogenic claudication. Primary OA of both knees(M17.0)   Observation/Other Assessments   Focus on Therapeutic Outcomes (FOTO)  50% limitation                     [facility] Adult PT Treatment/Exercise - 05/09/15 0001  Ambulation/Gait   Ambulation/Gait Yes   Ambulation/Gait Assistance 5: Supervision   Ambulation/Gait Assistance Details CGA; VC to keep Rollator closer to he and use a step to pattern r   Ambulation Distance (Feet) 200 Feet  75 ft black tile loop; 20x4 ft from chair to lobby door   Ambulation Surface Level   Gait Comments Has increased difficulties with changing surfaces/going over obstacles    Knee/Hip Exercises: Seated   Long Arc Quad Strengthening;Both;3 sets  No weight due to increased pain 2/2 to fall   Shoulder Exercises: Seated   Row Strengthening;20 reps;Both  red theraband   Horizontal ABduction Strengthening;10 reps   Theraband Level (Shoulder Horizontal ABduction) Level 1 (Yellow)   Other Seated Exercises 3 way shoulder raises; 10x each direction   1 lbs   Shoulder Exercises: ROM/Strengthening   UBE (Upper Arm Bike) 3 min  forward/backwards   due to increased pain in Lt. knee due to fall                   PT Short Term Goals - 05/09/15 1207    PT SHORT TERM GOAL #1   Title be independent in initial HEP   Time 4   Period Weeks   Status On-going   PT SHORT TERM GOAL #2   Title ambulate safely with a rolling walker to improve independence at home  Continues to require mod. VC to ambulate safely with rollator and to use a step-to pattern    Time 4   Period Weeks   Status On-going           PT Long Term Goals - 05/07/15 0901    PT LONG TERM GOAL #1   Title be independent in advanced HEP   Time 8   Period Weeks   Status On-going  Pt is independent in current HEP   PT LONG TERM GOAL #2   Title reduce FOTO to < or = to 55% limitation   Status Achieved  50% limitation   PT LONG TERM GOAL #3   Title perform TUG in < or = to 35 seconds to improve safety   Status On-going   PT LONG TERM GOAL #4   Title improve LE strength to perform sit to stand with moderate UE support   Status On-going  able to perform wiht moderate UE support from elevated surface   PT LONG TERM GOAL #5   Title report a 25% reduction in low back pain with standing tasks   Status Achieved  Pt denies any LBP               Plan - 05/09/15 1203    Clinical Impression Statement Increased pain in Rt. knee today due to fall this morning; focused more on UE general conditioning. Practiced 3 point step to pattern with rollator walker; required mod/max VC for proper form. Practiced sit <> stand safely with walker; VC required for safety awareness to not reach forward to walker. Continues to benefit from skilled PT for balance, gait and strengthening.     Pt will benefit from skilled therapeutic intervention in order to improve on the following deficits Abnormal gait;Decreased coordination;Difficulty walking;Decreased safety awareness;Decreased endurance;Decreased activity tolerance;Pain;Decreased balance;Decreased  strength   Rehab Potential Good   PT Frequency 2x / week   PT Duration 8 weeks   PT Treatment/Interventions ADLs/Self Care Home Management;Moist Heat;Therapeutic activities;Patient/family education;Therapeutic exercise;Ultrasound;Manual techniques;Balance training;Functional mobility training;Cryotherapy;Neuromuscular re-education   PT Next Visit Plan Practice tandem stance walking/cross overs/side  step walking at counter; continue practicing ambulation with safety with rollator    PT Home Exercise Plan review HEP   Consulted and Agree with Plan of Care Patient          G-Codes - 06/01/2015 1118    Functional Assessment Tool Used FOTO: 50% limitation   Functional Limitation Other PT primary   Other PT Primary Current Status (R9758) At least 40 percent but less than 60 percent impaired, limited or restricted   Other PT Primary Goal Status (I3254) At least 40 percent but less than 60 percent impaired, limited or restricted      Problem List Patient Active Problem List   Diagnosis Date Noted  . Memory loss 04/05/2014  . Type II or unspecified type diabetes mellitus with neurological manifestations, uncontrolled 01/24/2014  . Chronic renal insufficiency, stage II (mild) 03/21/2013  . Encounter for long-term (current) use of other medications 07/29/2012  . Diabetes 04/28/2012  . CVA (cerebral vascular accident)   . EDEMA- LOCALIZED 01/06/2011  . Chronic systolic heart failure 98/26/4158  . GERD 04/30/2010  . TRANSIENT ISCHEMIC ATTACKS, HX OF 04/30/2010  . DYSPEPSIA 04/14/2010  . WEAKNESS 03/19/2010  . CONSTIPATION 02/13/2010  . ANEMIA, MILD 01/14/2010  . HYPERLIPIDEMIA-MIXED 09/16/2008  . HYPERTENSION, BENIGN 09/16/2008  . CAD, AUTOLOGOUS BYPASS GRAFT 09/16/2008  . SICK SINUS/ TACHY-BRADY SYNDROME 09/16/2008  . PACEMAKER, PERMANENT 09/16/2008  . CAD 10/25/2007  . Hypothyroidism 06/14/2007  . COLONIC POLYPS, HX OF 03/23/2007   Reginal Lutes, SPT 2015/06/01 1:03 PM   During  this treatment session, the therapist was present, participating in, and directing the treatment. Physical Therapy Progress Note  Dates of Reporting Period: 04/08/15 to 06-01-15  Objective Reports of Subjective Statement: See above for clinical impression statement and goal status.  Objective Measurements: FOTO: 50% limitation  Goal Update: See above for goal status  Plan: See above plan. 2 more weeks probable until 06/03/15 to build HEP and to address gait and balance deficits.    Reason Skilled Services are Required: Pt with balance deficits and recent falls.  Pt with LE/UE weakness that reduces ease and safety with sit to stand.  See above clinical impression statement and PT problem list.  TAKACS,KELLY 2015/06/01, 1:03 PM  [facility] [address] [place], [place], [address] Phone: [phone]   Fax:  [phone]

## 2015-05-10 ENCOUNTER — Telehealth: Payer: Self-pay | Admitting: Internal Medicine

## 2015-05-10 NOTE — Telephone Encounter (Signed)
error 

## 2015-05-14 ENCOUNTER — Telehealth: Payer: Self-pay | Admitting: Internal Medicine

## 2015-05-14 NOTE — Telephone Encounter (Signed)
Follow Up        [name] calling back to f/u in regards to previous message. States that pt is still having palpitations and that the physical therapist will no longer see her until she has been cleared by her cardiologist that it is ok. Please call back and advise.

## 2015-05-14 NOTE — Telephone Encounter (Signed)
Pt's dtr calling to see if ok for pt to have PT, has appt tomorrow and has new onset afib and appt 05-20-15 with Amber-at the time I asked  dtr if pt had any symptoms and she said no-on my voicemail today she states she ask pt after talking with me and she states she has felt her heart racing-pls advise

## 2015-05-15 ENCOUNTER — Ambulatory Visit: Payer: Medicare Other

## 2015-05-15 NOTE — Telephone Encounter (Signed)
Spoke with daughter yesterday and let her know that as she is asymptomatic with this it is ok to do her PT.  She will keep her appointment as scheduled for next week

## 2015-05-16 ENCOUNTER — Other Ambulatory Visit: Payer: Self-pay | Admitting: Endocrinology

## 2015-05-17 ENCOUNTER — Ambulatory Visit: Payer: Medicare Other | Admitting: Physical Therapy

## 2015-05-17 ENCOUNTER — Encounter: Payer: Self-pay | Admitting: Physical Therapy

## 2015-05-17 DIAGNOSIS — R269 Unspecified abnormalities of gait and mobility: Secondary | ICD-10-CM

## 2015-05-17 DIAGNOSIS — R29898 Other symptoms and signs involving the musculoskeletal system: Secondary | ICD-10-CM

## 2015-05-17 DIAGNOSIS — R2681 Unsteadiness on feet: Secondary | ICD-10-CM

## 2015-05-17 NOTE — Therapy (Signed)
[facility] [address], [place], [place], [address] Phone: [phone]   Fax:  [phone]  Physical Therapy Treatment  Patient Details  Name: [name] MRN: [id number] Date of Birth: 1925/08/04 Referring Provider:  Hendricks Limes, MD  Encounter Date: 05/17/2015      PT End of Session - 05/17/15 0925    Visit Number 11   Number of Visits 20   Date for PT Re-Evaluation 06/03/15   PT Start Time 0848   PT Stop Time 0927   PT Time Calculation (min) 39 min   Equipment Utilized During Treatment Other (comment);Gait belt   Activity Tolerance Patient tolerated treatment well;Patient limited by fatigue      Past Medical History  Diagnosis Date  . Sliding hiatal hernia   . Erosive esophagitis   . Peptic stricture of esophagus   . Diverticulosis   . Arthritis   . Hypothyroidism   . Diabetes mellitus     Type I  . Coronary artery disease     s/p CABG 2001  . Sick sinus syndrome     s/p Medtronic DDD pacemaker implanted; generator change 02/2012  . Hypertension   . Hyperlipidemia   . GERD (gastroesophageal reflux disease)   . Adenomatous colon polyp 07/2002  . CVA (cerebral vascular accident)     Dr Evelena Leyden, Neurology  . Obesity   . Paroxysmal atrial fibrillation 02/04/15    discovered on PPM remote interrogation 6/16    Past Surgical History  Procedure Laterality Date  . Pacemaker placement  03/25/2000    by Dr Olevia Perches  . Tonsillectomy and adenoidectomy    . Appendectomy    . Cholecystectomy    . Total abdominal hysterectomy w/ bilateral salpingoophorectomy      Endometiosis  . Replacement total knee  2004    Right  . Colonoscopy w/ polypectomy  2003  . Cataract extraction  05/2005  . Coronary artery bypass graft  02/2000    5 vessel  . Rotator cuff repair    . Pacemaker generator replacement  03/2012    Dr. Rayann Heman  . Permanent pacemaker generator change  03/18/2012    Procedure: PERMANENT PACEMAKER  GENERATOR CHANGE;  Surgeon: Thompson Grayer, MD;  Location: [facility];  Service: Cardiovascular;;    There were no vitals filed for this visit.  Visit Diagnosis:  Abnormality of gait  Weakness of both legs  Gait instability      Subjective Assessment - 05/17/15 0850    Subjective Seeing cardiologist MOnday for possible A Fib symptoms. Ok to do therapy just monitor.   Currently in Pain? No/denies   Multiple Pain Sites No                         [facility] Adult PT Treatment/Exercise - 05/17/15 0001    Ambulation/Gait   Ambulation/Gait Yes   Ambulation/Gait Assistance Details --  VC mainly on posture and picking feet up.    Ambulation Distance (Feet) --  120 feet x 3   Ambulation Surface Level   Knee/Hip Exercises: Seated   Long Arc Quad Strengthening;Both;2 sets;10 reps   Long Arc Quad Weight 2 lbs.   Long Arc Quad Limitations Pt required rest break in betwen sets to catch her breath.   Other Seated Knee/Hip Exercises Hip abduction red 2x10   Other Seated Knee/Hip Exercises Hip adduction 5 sec hold 10 x   Shoulder Exercises: Seated   Horizontal  ABduction Strengthening;Both;20 reps;Theraband   Theraband Level (Shoulder Horizontal ABduction) Level 1 (Yellow)                  PT Short Term Goals - 05/17/15 0900    PT SHORT TERM GOAL #1   Time 4   Period Weeks   PT SHORT TERM GOAL #2   Title ambulate safely with a rolling walker to improve independence at home   Time 4   Period Weeks   Status On-going  Had recent fall, also dealing with possible A-fib.   PT SHORT TERM GOAL #3   Title perform TUG in < or = to 40 seconds to improve safety   Time 4   Period Weeks   Status Achieved           PT Long Term Goals - 05/07/15 0901    PT LONG TERM GOAL #1   Title be independent in advanced HEP   Time 8   Period Weeks   Status On-going  Pt is independent in current HEP   PT LONG TERM GOAL #2   Title reduce FOTO to < or = to 55% limitation    Status Achieved  50% limitation   PT LONG TERM GOAL #3   Title perform TUG in < or = to 35 seconds to improve safety   Status On-going   PT LONG TERM GOAL #4   Title improve LE strength to perform sit to stand with moderate UE support   Status On-going  able to perform wiht moderate UE support from elevated surface   PT LONG TERM GOAL #5   Title report a 25% reduction in low back pain with standing tasks   Status Achieved  Pt denies any LBP               Plan - 05/17/15 0926    Clinical Impression Statement Monitored pt's breathing rate throughout session. Only one time where pt needed to take a rest break from an obvious increase in breahting rate. All other exercises were performed wihtout duress. She will see cardiologist Monday where daughter reports she may have more to report about her cardiovascular. Kept exercises simplke due to daughters request today.    Pt will benefit from skilled therapeutic intervention in order to improve on the following deficits Abnormal gait;Decreased coordination;Difficulty walking;Decreased safety awareness;Decreased endurance;Decreased activity tolerance;Pain;Decreased balance;Decreased strength   Rehab Potential Good   PT Frequency 2x / week   PT Duration 8 weeks   PT Treatment/Interventions ADLs/Self Care Home Management;Moist Heat;Therapeutic activities;Patient/family education;Therapeutic exercise;Ultrasound;Manual techniques;Balance training;Functional mobility training;Cryotherapy;Neuromuscular re-education   PT Next Visit Plan See what cardiologist says and continue per any new instructions as well on balance, gait, and strength.    Consulted and Agree with Plan of Care Patient        Problem List Patient Active Problem List   Diagnosis Date Noted  . Memory loss 04/05/2014  . Type II or unspecified type diabetes mellitus with neurological manifestations, uncontrolled 01/24/2014  . Chronic renal insufficiency, stage II (mild)  03/21/2013  . Encounter for long-term (current) use of other medications 07/29/2012  . Diabetes 04/28/2012  . CVA (cerebral vascular accident)   . EDEMA- LOCALIZED 01/06/2011  . Chronic systolic heart failure 58/52/7782  . GERD 04/30/2010  . TRANSIENT ISCHEMIC ATTACKS, HX OF 04/30/2010  . DYSPEPSIA 04/14/2010  . WEAKNESS 03/19/2010  . CONSTIPATION 02/13/2010  . ANEMIA, MILD 01/14/2010  . HYPERLIPIDEMIA-MIXED 09/16/2008  . HYPERTENSION, BENIGN 09/16/2008  .  CAD, AUTOLOGOUS BYPASS GRAFT 09/16/2008  . SICK SINUS/ TACHY-BRADY SYNDROME 09/16/2008  . PACEMAKER, PERMANENT 09/16/2008  . CAD 10/25/2007  . Hypothyroidism 06/14/2007  . COLONIC POLYPS, HX OF 03/23/2007    Manroop Jakubowicz, PTA 05/17/2015, 9:30 AM  [facility] [address], [place], [place], [address] Phone: [phone]   Fax:  [phone]

## 2015-05-19 ENCOUNTER — Encounter: Payer: Self-pay | Admitting: Nurse Practitioner

## 2015-05-19 NOTE — Progress Notes (Addendum)
Electrophysiology Office Note Date: 05/20/2015  ID:  [name], [name], MRN [id number]  PCP: Unice Cobble, MD Electrophysiologist: Allred  CC: new AF found on PPM remote interrogation  [name] is a 81 y.o. female seen today for Dr Rayann Heman. She presents today for electrophysiology followup after atrial fibrillation was recently identified on remote PPM interrogation.  Since last being seen in our clinic, the patient reports doing reasonably well. She has had some recent falls and is going to physical therapy which she and her daughter believe is helping.  She has occasional palpitations, but denies chest pain, dyspnea, PND, orthopnea, nausea, vomiting, dizziness, syncope.  Last echo 2011 demonstrated EF 45-50%, hypokinesis of mid-distal inferior and posterior myocardium, grade 1 diastolic dysfunction, LA 42.   Device History: MDT dual chamber PPM implanted 2001 for SSS by Dr Olevia Perches; gen change 2013 Dr Rayann Heman   Past Medical History  Diagnosis Date  . Sliding hiatal hernia   . Erosive esophagitis   . Peptic stricture of esophagus   . Diverticulosis   . Arthritis   . Hypothyroidism   . Diabetes mellitus     Type I  . Coronary artery disease     s/p CABG 2001  . Sick sinus syndrome     s/p Medtronic DDD pacemaker implanted; generator change 02/2012  . Hypertension   . Hyperlipidemia   . GERD (gastroesophageal reflux disease)   . Adenomatous colon polyp 07/2002  . CVA (cerebral vascular accident)     Dr Evelena Leyden, Neurology  . Obesity   . Paroxysmal atrial fibrillation 02/04/15    discovered on PPM remote interrogation 6/16   Past Surgical History  Procedure Laterality Date  . Pacemaker placement  03/25/2000    by Dr Olevia Perches  . Tonsillectomy and adenoidectomy    . Appendectomy    . Cholecystectomy    . Total abdominal hysterectomy w/ bilateral salpingoophorectomy      Endometiosis  . Replacement total knee  2004    Right  . Colonoscopy w/ polypectomy  2003  .  Cataract extraction  05/2005  . Coronary artery bypass graft  02/2000    5 vessel  . Rotator cuff repair    . Permanent pacemaker generator change  03/18/2012    PPM gen change by Dr Rayann Heman    Current Outpatient Prescriptions  Medication Sig Dispense Refill  . acarbose (PRECOSE) 50 MG tablet TAKE 1 TABLET BY MOUTH 3 TIMES A DAY WITH MEALS 90 tablet 11  . amitriptyline (ELAVIL) 25 MG tablet TAKE 1 TABLET (25 MG TOTAL) BY MOUTH AT BEDTIME. 90 tablet 2  . atorvastatin (LIPITOR) 20 MG tablet TAKE 1 TABLET BY MOUTH EVERY DAY (IN PLACE OF PRAVASTATIN) 30 tablet 3  . bromocriptine (PARLODEL) 2.5 MG tablet TAKE 1 TABLET (2.5 MG TOTAL) BY MOUTH AT BEDTIME. 30 tablet 10  . Calcium Carbonate (CALCIUM 500 PO) Take 500 mg by mouth daily.     . colesevelam (WELCHOL) 625 MG tablet Take 3 tablets (1,875 mg total) by mouth 2 (two) times daily before a meal. 540 tablet 3  . donepezil (ARICEPT) 10 MG tablet Take 1 tablet (10 mg total) by mouth at bedtime. 90 tablet 3  . enalapril (VASOTEC) 20 MG tablet TAKE 1 TABLET BY MOUTH DAILY 90 tablet 1  . fish oil-omega-3 fatty acids 1000 MG capsule Take 1 g by mouth daily.    . INVOKANA 100 MG TABS tablet TAKE 1 TABLET BY MOUTH EVERY DAY 90 tablet  2  . JANUVIA 100 MG tablet TAKE 1 TABLET (100 MG TOTAL) BY MOUTH DAILY. 30 tablet 11  . levothyroxine (SYNTHROID, LEVOTHROID) 25 MCG tablet TAKE 1 AND 1/2 TABET BY MOUTH EVERY DAY 135 tablet 3  . memantine (NAMENDA XR) 28 MG CP24 24 hr capsule Take 1 capsule (28 mg total) by mouth daily. 90 capsule 3  . metoprolol (TOPROL-XL) 200 MG 24 hr tablet TAKE 1 TABLET BY MOUTH EVERY DAY 30 tablet 5  . naproxen sodium (ANAPROX) 220 MG tablet Take 220 mg by mouth 2 (two) times daily as needed.    . niacin 500 MG tablet Take 500 mg by mouth daily with breakfast.     . nitroGLYCERIN (NITROSTAT) 0.3 MG SL tablet Place 1 tablet (0.3 mg total) under the tongue every 5 (five) minutes as needed. For chest pain 90 tablet 3  . pantoprazole  (PROTONIX) 40 MG tablet Take 1 tablet (40 mg total) by mouth daily. 90 tablet 1  . repaglinide (PRANDIN) 2 MG tablet TAKE 1 TABLET (2 MG TOTAL) BY MOUTH 3 (THREE) TIMES DAILY BEFORE MEALS. 90 tablet 11   No current facility-administered medications for this visit.    Allergies:   Codeine   Social History: History   Social History  . Marital Status: Widowed    Spouse Name: N/A  . Number of Children: 1  . Years of Education: N/A   Occupational History  . Retired    Social History Main Topics  . Smoking status: Former Research scientist (life sciences)  . Smokeless tobacco: Former Systems developer    Quit date: 11/23/1981     Comment: quit 28+ yrs ago (as of 2012)  . Alcohol Use: No  . Drug Use: No  . Sexual Activity: No   Other Topics Concern  . Not on file   Social History Narrative   Patient lives at home alone. Patient's daughter was with her [name] ) [phone]   Retired.   Education college   Right handed.   Caffeine one cup of coffee daily.   .    Family History: Family History  Problem Relation Age of Onset  . Colon cancer Mother 3  . Breast cancer Mother   . Transient ischemic attack Mother   . Cancer Mother   . Hypertension Mother   . Stroke Mother   . Diabetes Mother   . Colon cancer Brother 41  . Cancer Brother   . Hypertension Brother   . Heart attack Brother   . Breast cancer Sister   . Cancer Sister   . Diabetes Sister   . Prostate cancer Brother   . Heart attack Brother   . Heart disease      3 brothers had MI; 1 pre 75  . Hypertension Sister   . Heart attack Daughter   . Diabetes Maternal Aunt      Review of Systems: All other systems reviewed and are otherwise negative except as noted above.   Physical Exam: VS:  BP 116/68 mmHg  Pulse 67  Ht 5\' 6"  (1.676 m)  Wt 206 lb 12.8 oz (93.804 kg)  BMI 33.39 kg/m2 , BMI Body mass index is 33.39 kg/(m^2).  GEN- The patient is elderly and obese appearing, alert and oriented x 3 today.   HEENT: normocephalic,  atraumatic; sclera clear, conjunctiva pink; hearing intact; oropharynx clear; neck supple  Lungs- Clear to ausculation bilaterally, normal work of breathing.  No wheezes, rales, rhonchi Heart- Regular rate and rhythm (paced) GI- soft, non-tender, non-distended,  bowel sounds present  Extremities- no clubbing, cyanosis, 1+ BLE edema  MS- no significant deformity or atrophy Skin- warm and dry, no rash or lesion; PPM pocket well healed Psych- euthymic mood, full affect Neuro- strength and sensation are intact  PPM Interrogation- reviewed in detail today,  See PACEART report  EKG:  EKG is ordered today. The ekg ordered today shows AV pacing  Recent Labs: 12/24/2014: BUN 24*; Creatinine, Ser 1.50*; Potassium 4.3; Sodium 137; TSH 4.34   Wt Readings from Last 3 Encounters:  05/20/15 206 lb 12.8 oz (93.804 kg)  04/08/15 209 lb 6.4 oz (94.983 kg)  03/21/15 206 lb 4 oz (93.554 kg)     Other studies Reviewed: Additional studies/ records that were reviewed today include: Dr Jackalyn Lombard notes, cath reports  Assessment and Plan:  1.  Symptomatic bradycardia Normal PPM function See Pace Art report No changes today  2.  Paroxysmal atrial fibrillation Newly discovered on recent PPM remote interrogation CHADS2VASC score is at least 8 Start Eliquis 2.5mg  (age >42, creat 1.5 on most recent labs) - Dr Rayann Heman discussed with Dr Jannifer Franklin BMET today and follow up with anticoagulation clinic in 4 weeks V rates are elevated while in AF.  She is on Toprol XL 200mg  daily. Will give prn cardizem to take as needed for palpitations, however, the patient feels that her palpitations are so infrequent that she will not need this.  Will further evaluate V rates at office visit in 3 months.  3.  CAD s/p CABG No recent ischemic symptoms She has been maintained on Plavix since bypass in 2001 Discontinue Plavix at this time  4.  HTN Stable No change required today   Current medicines are reviewed at length with  the patient today.   The patient does not have concerns regarding her medicines.  The following changes were made today:  Stop Plavix, start Eliquis 2.5mg  twice daily  Labs/ tests ordered today include: BMET, CBC    Disposition:   Follow up with anticoagulation clinic in 4 weeks; follow up with me in 3 months.    Signed, Chanetta Marshall, NP 05/20/2015 12:45 PM  [place] [address] [phone] (office) [phone] (fax)

## 2015-05-20 ENCOUNTER — Encounter: Payer: Self-pay | Admitting: Nurse Practitioner

## 2015-05-20 ENCOUNTER — Ambulatory Visit (INDEPENDENT_AMBULATORY_CARE_PROVIDER_SITE_OTHER): Payer: Medicare Other | Admitting: Nurse Practitioner

## 2015-05-20 ENCOUNTER — Other Ambulatory Visit: Payer: Self-pay | Admitting: Internal Medicine

## 2015-05-20 VITALS — BP 116/68 | HR 67 | Ht 66.0 in | Wt 206.8 lb

## 2015-05-20 DIAGNOSIS — I480 Paroxysmal atrial fibrillation: Secondary | ICD-10-CM | POA: Diagnosis not present

## 2015-05-20 DIAGNOSIS — R001 Bradycardia, unspecified: Secondary | ICD-10-CM

## 2015-05-20 DIAGNOSIS — Z950 Presence of cardiac pacemaker: Secondary | ICD-10-CM

## 2015-05-20 DIAGNOSIS — Z79899 Other long term (current) drug therapy: Secondary | ICD-10-CM

## 2015-05-20 DIAGNOSIS — I10 Essential (primary) hypertension: Secondary | ICD-10-CM | POA: Diagnosis not present

## 2015-05-20 LAB — BASIC METABOLIC PANEL
BUN: 29 mg/dL — ABNORMAL HIGH (ref 6–23)
CHLORIDE: 104 meq/L (ref 96–112)
CO2: 26 mEq/L (ref 19–32)
Calcium: 9.8 mg/dL (ref 8.4–10.5)
Creatinine, Ser: 1.56 mg/dL — ABNORMAL HIGH (ref 0.40–1.20)
GFR: 33.33 mL/min — ABNORMAL LOW (ref >60.00)
GLUCOSE: 201 mg/dL — AB (ref 70–99)
Potassium: 4.3 mEq/L (ref 3.5–5.1)
Sodium: 135 mEq/L (ref 135–145)

## 2015-05-20 LAB — CUP PACEART INCLINIC DEVICE CHECK: Date Time Interrogation Session: 20160706123358

## 2015-05-20 LAB — CBC WITH DIFFERENTIAL/PLATELET
BASOS PCT: 0.4 % (ref 0.0–3.0)
Basophils Absolute: 0 10*3/uL (ref 0.0–0.1)
EOS PCT: 2.9 % (ref 0.0–5.0)
Eosinophils Absolute: 0.2 10*3/uL (ref 0.0–0.7)
HCT: 37 % (ref 36.0–46.0)
HEMOGLOBIN: 12.2 g/dL (ref 12.0–15.0)
LYMPHS PCT: 30 % (ref 12.0–46.0)
Lymphs Abs: 2.3 10*3/uL (ref 0.7–4.0)
MCHC: 33 g/dL (ref 30.0–36.0)
MCV: 89 fl (ref 78.0–100.0)
Monocytes Absolute: 0.6 10*3/uL (ref 0.1–1.0)
Monocytes Relative: 7.6 % (ref 3.0–12.0)
NEUTROS PCT: 59.1 % (ref 43.0–77.0)
Neutro Abs: 4.6 10*3/uL (ref 1.4–7.7)
Platelets: 179 10*3/uL (ref 150.0–400.0)
RBC: 4.16 Mil/uL (ref 3.87–5.11)
RDW: 13.9 % (ref 11.5–15.5)
WBC: 7.8 10*3/uL (ref 4.0–10.5)

## 2015-05-20 MED ORDER — APIXABAN 2.5 MG PO TABS: 2.5000 mg | ORAL_TABLET | Freq: Two times a day (BID) | ORAL | Status: DC

## 2015-05-20 MED ORDER — DILTIAZEM HCL 30 MG PO TABS: 30.0000 mg | ORAL_TABLET | Freq: Four times a day (QID) | ORAL | Status: DC | PRN

## 2015-05-20 NOTE — Patient Instructions (Signed)
Medication Instructions:  STOP   PLAVIX  START  ELIQUIS 2.5 MG  TWICE  DAILY  DILTIAZEM  30 MG  EVERY   6 HOURS   AS NEEDED  Labwork: TODAY  BMET  CBC  Testing/Procedures: NONE  Follow-Up: Your physician recommends that you schedule a follow-up appointment in:  4 WEEKS  WITH  SALLY  AND  3 MONTHS  WITH   AMBER  Any Other Special Instructions Will Be Listed Below (If Applicable).

## 2015-05-21 ENCOUNTER — Ambulatory Visit: Payer: Medicare Other

## 2015-05-21 DIAGNOSIS — R2681 Unsteadiness on feet: Secondary | ICD-10-CM

## 2015-05-21 DIAGNOSIS — R269 Unspecified abnormalities of gait and mobility: Secondary | ICD-10-CM

## 2015-05-21 DIAGNOSIS — R29898 Other symptoms and signs involving the musculoskeletal system: Secondary | ICD-10-CM

## 2015-05-21 NOTE — Therapy (Signed)
[facility] [address], [place], [place], [address] Phone: [phone]   Fax:  [phone]  Physical Therapy Treatment  Patient Details  Name: [name] MRN: [id number] Date of Birth: 11/16/1925 Referring Provider:  Hendricks Limes, MD  Encounter Date: 05/21/2015      PT End of Session - 05/21/15 0914    Visit Number 12   Number of Visits 20   Date for PT Re-Evaluation 06/03/15   PT Start Time 0847   PT Stop Time 0930   PT Time Calculation (min) 43 min   Equipment Utilized During Treatment Gait belt   Activity Tolerance Patient tolerated treatment well   Behavior During Therapy [facility] for tasks assessed/performed      Past Medical History  Diagnosis Date  . Sliding hiatal hernia   . Erosive esophagitis   . Peptic stricture of esophagus   . Diverticulosis   . Arthritis   . Hypothyroidism   . Diabetes mellitus     Type I  . Coronary artery disease     s/p CABG 2001  . Sick sinus syndrome     s/p Medtronic DDD pacemaker implanted; generator change 02/2012  . Hypertension   . Hyperlipidemia   . GERD (gastroesophageal reflux disease)   . Adenomatous colon polyp 07/2002  . CVA (cerebral vascular accident)     Dr Evelena Leyden, Neurology  . Obesity   . Paroxysmal atrial fibrillation 02/04/15    discovered on PPM remote interrogation 6/16    Past Surgical History  Procedure Laterality Date  . Pacemaker placement  03/25/2000    by Dr Olevia Perches  . Tonsillectomy and adenoidectomy    . Appendectomy    . Cholecystectomy    . Total abdominal hysterectomy w/ bilateral salpingoophorectomy      Endometiosis  . Replacement total knee  2004    Right  . Colonoscopy w/ polypectomy  2003  . Cataract extraction  05/2005  . Coronary artery bypass graft  02/2000    5 vessel  . Rotator cuff repair    . Permanent pacemaker generator change  03/18/2012    PPM gen change by Dr Rayann Heman    There were no vitals filed for this  visit.  Visit Diagnosis:  Abnormality of gait  Weakness of both legs  Gait instability      Subjective Assessment - 05/21/15 0854    Subjective Will be seeing ortho doctor on 7/9 for possible knee surgery. No pain today.    Pertinent History Rt TKA, CVA   How long can you stand comfortably? able to stand 1.5 hours cooking the other day   How long can you walk comfortably? Able to walk around the house; can walk for 20 minutes    Patient Stated Goals improve gait and balance   Currently in Pain? No/denies                         [facility] Adult PT Treatment/Exercise - 05/21/15 0001    Ambulation/Gait   Ambulation/Gait Yes   Ambulation/Gait Assistance 5: Supervision   Ambulation/Gait Assistance Details CGA to supervision    Ambulation Distance (Feet) 200 Feet  VC required to walk at a slower pace   Ambulation Surface Level   Knee/Hip Exercises: Standing   Other Standing Knee Exercises Tandem stance at counter; 30 secx2 bil  No UE support; 4 LOB with Lt. behind, 2 with Rt. behin   Knee/Hip Exercises: Seated  Long Arc [employer] Strengthening;Both;2 sets;10 reps   Long Arc [employer] 3 lbs.  3x10 each leg   Other Seated Knee/Hip Exercises Hip abduction red 2x10  Advanced to green; 1 set of 7   Other Seated Knee/Hip Exercises Hip adduction 5 sec hold 20 x   Shoulder Exercises: ROM/Strengthening   UBE (Upper Arm Bike) 6 min forward/backwards   resistance 0                   PT Short Term Goals - 05/21/15 0902    PT SHORT TERM GOAL #1   Title be independent in initial HEP   Time 4   Period Weeks   Status On-going   PT SHORT TERM GOAL #2   Title ambulate safely with a rolling walker to improve independence at home  Reports that she is able to walk around the house with the walker and has not had a loss of balance/ fall in the past week    Time 4   Period Weeks   Status Achieved           PT Long Term Goals - 05/07/15 0901    PT LONG TERM GOAL  #1   Title be independent in advanced HEP   Time 8   Period Weeks   Status On-going  Pt is independent in current HEP   PT LONG TERM GOAL #2   Title reduce FOTO to < or = to 55% limitation   Status Achieved  50% limitation   PT LONG TERM GOAL #3   Title perform TUG in < or = to 35 seconds to improve safety   Status On-going   PT LONG TERM GOAL #4   Title improve LE strength to perform sit to stand with moderate UE support   Status On-going  able to perform wiht moderate UE support from elevated surface   PT LONG TERM GOAL #5   Title report a 25% reduction in low back pain with standing tasks   Status Achieved  Pt denies any LBP               Plan - 05/21/15 6644    Clinical Impression Statement Pt has made improvement in ambulation endurance and did not require any rest breaks with exercise today. Attempted tandem stance for 30 seconds bilaterally with supervision; had 2-3 LOB each set and increased postural sway. Tolerated progression of strengthening exercises well. Will continue to benefit from LE strengthening  and  higher level balance training.    Pt will benefit from skilled therapeutic intervention in order to improve on the following deficits Abnormal gait;Decreased coordination;Difficulty walking;Decreased safety awareness;Decreased endurance;Decreased activity tolerance;Decreased balance;Decreased strength   Rehab Potential Good   PT Frequency 2x / week   PT Duration 8 weeks   PT Treatment/Interventions ADLs/Self Care Home Management;Moist Heat;Therapeutic activities;Patient/family education;Therapeutic exercise;Ultrasound;Manual techniques;Balance training;Functional mobility training;Cryotherapy;Neuromuscular re-education   PT Next Visit Plan Tandem walking, standing on foam pad, attempt stairs, sit to stand; review/add to HEP    PT Home Exercise Plan review HEP   Consulted and Agree with Plan of Care Patient        Problem List Patient Active Problem List    Diagnosis Date Noted  . Memory loss 04/05/2014  . Type II or unspecified type diabetes mellitus with neurological manifestations, uncontrolled 01/24/2014  . Chronic renal insufficiency, stage II (mild) 03/21/2013  . Encounter for long-term (current) use of other medications 07/29/2012  . Diabetes 04/28/2012  . CVA (  cerebral vascular accident)   . EDEMA- LOCALIZED 01/06/2011  . Chronic systolic heart failure 22/48/2500  . GERD 04/30/2010  . TRANSIENT ISCHEMIC ATTACKS, HX OF 04/30/2010  . DYSPEPSIA 04/14/2010  . WEAKNESS 03/19/2010  . CONSTIPATION 02/13/2010  . ANEMIA, MILD 01/14/2010  . HYPERLIPIDEMIA-MIXED 09/16/2008  . HYPERTENSION, BENIGN 09/16/2008  . CAD, AUTOLOGOUS BYPASS GRAFT 09/16/2008  . SICK SINUS/ TACHY-BRADY SYNDROME 09/16/2008  . PACEMAKER, PERMANENT 09/16/2008  . CAD 10/25/2007  . Hypothyroidism 06/14/2007  . COLONIC POLYPS, HX OF 03/23/2007    Reginal Lutes, SPT 05/21/2015 10:12 AM  During this treatment session, the therapist was present, participating in, and directing the treatment. TAKACS,KELLY 05/21/2015, 10:12 AM  [facility] [address], [place], [place], [address] Phone: [phone]   Fax:  [phone]

## 2015-05-22 ENCOUNTER — Encounter: Payer: Medicare Other | Admitting: Nurse Practitioner

## 2015-05-23 ENCOUNTER — Ambulatory Visit: Payer: Medicare Other

## 2015-05-23 DIAGNOSIS — R2681 Unsteadiness on feet: Secondary | ICD-10-CM | POA: Diagnosis not present

## 2015-05-23 DIAGNOSIS — R29898 Other symptoms and signs involving the musculoskeletal system: Secondary | ICD-10-CM

## 2015-05-23 DIAGNOSIS — R269 Unspecified abnormalities of gait and mobility: Secondary | ICD-10-CM

## 2015-05-23 NOTE — Patient Instructions (Signed)
Marching In-Place   Standing next to the counter, march in place for 30 seconds. Do __2_ sets. Do _2__ times per day.  Copyright  VHI. All rights reserved.

## 2015-05-23 NOTE — Therapy (Signed)
[facility] [address] [place], [place], [address] Phone: [phone]   Fax:  [phone]  Physical Therapy Treatment  Patient Details  Name: [name] MRN: [id number] Date of Birth: March 15, 1925 Referring Provider:  Hendricks Limes, MD  Encounter Date: 05/23/2015      PT End of Session - 05/23/15 0856    Visit Number 13   Number of Visits 20   Date for PT Re-Evaluation 06/03/15   PT Start Time 0846   PT Stop Time 0937   PT Time Calculation (min) 51 min      Past Medical History  Diagnosis Date  . Sliding hiatal hernia   . Erosive esophagitis   . Peptic stricture of esophagus   . Diverticulosis   . Arthritis   . Hypothyroidism   . Diabetes mellitus     Type I  . Coronary artery disease     s/p CABG 2001  . Sick sinus syndrome     s/p Medtronic DDD pacemaker implanted; generator change 02/2012  . Hypertension   . Hyperlipidemia   . GERD (gastroesophageal reflux disease)   . Adenomatous colon polyp 07/2002  . CVA (cerebral vascular accident)     Dr Evelena Leyden, Neurology  . Obesity   . Paroxysmal atrial fibrillation 02/04/15    discovered on PPM remote interrogation 6/16    Past Surgical History  Procedure Laterality Date  . Pacemaker placement  03/25/2000    by Dr Olevia Perches  . Tonsillectomy and adenoidectomy    . Appendectomy    . Cholecystectomy    . Total abdominal hysterectomy w/ bilateral salpingoophorectomy      Endometiosis  . Replacement total knee  2004    Right  . Colonoscopy w/ polypectomy  2003  . Cataract extraction  05/2005  . Coronary artery bypass graft  02/2000    5 vessel  . Rotator cuff repair    . Permanent pacemaker generator change  03/18/2012    PPM gen change by Dr Rayann Heman    There were no vitals filed for this visit.  Visit Diagnosis:  Abnormality of gait  Weakness of both legs  Gait instability      Subjective Assessment - 05/23/15 0948    Subjective Continues to  try to do seated exercises at home. Increased pain today with bending Lt. knee today.    Pertinent History Rt TKA, CVA   Patient Stated Goals improve gait and balance   Currently in Pain? No/denies                         [facility] Adult PT Treatment/Exercise - 05/23/15 0001    Knee/Hip Exercises: Aerobic   Stationary Bike Limited abilty to perform due to Lt. knee pain in flexion    Knee/Hip Exercises: Standing   Other Standing Knee Exercises Tandem stance, 30 seconds, 1 UE support x 3; alternate legs    Other Standing Knee Exercises standing hip abduction; 5 reps each leg; 5 sets    Knee/Hip Exercises: Seated   Long Arc Quad Strengthening;Both;10 reps;3 sets   Long Arc Quad Weight 3 lbs.   Other Seated Knee/Hip Exercises Marching with 3 lbs weight; 1 min x 3    Shoulder Exercises: ROM/Strengthening   UBE (Upper Arm Bike) 6 min forward/backwards   resistance 0                 PT Education - 05/23/15 2671  Education provided Yes   Education Details Reviewed HEP, standing marching    Person(s) Educated Patient;Child(ren)   Methods Explanation;Demonstration;Handout   Comprehension Verbalized understanding          PT Short Term Goals - 05/21/15 0902    PT SHORT TERM GOAL #1   Title be independent in initial HEP   Time 4   Period Weeks   Status On-going   PT SHORT TERM GOAL #2   Title ambulate safely with a rolling walker to improve independence at home  Reports that she is able to walk around the house with the walker and has not had a loss of balance/ fall in the past week    Time 4   Period Weeks   Status Achieved           PT Long Term Goals - 05/07/15 0901    PT LONG TERM GOAL #1   Title be independent in advanced HEP   Time 8   Period Weeks   Status On-going  Pt is independent in current HEP   PT LONG TERM GOAL #2   Title reduce FOTO to < or = to 55% limitation   Status Achieved  50% limitation   PT LONG TERM GOAL #3   Title  perform TUG in < or = to 35 seconds to improve safety   Status On-going   PT LONG TERM GOAL #4   Title improve LE strength to perform sit to stand with moderate UE support   Status On-going  able to perform wiht moderate UE support from elevated surface   PT LONG TERM GOAL #5   Title report a 25% reduction in low back pain with standing tasks   Status Achieved  Pt denies any LBP               Plan - 05/23/15 0943    Clinical Impression Statement Able to perform standing exercises today with frequent rest breaks; decreased tolerance for WB in tandem stance/ standing exercises in Lt. knee secondary to pain.  Lt. knee pain and mobility improved with seated exericse and movement per patient report. Pt is being assessed by MD next week for Lt. knee pain and potential surgery. Continues to require cueing for safely managing Rollator walker, especially when fatigued. Will benefit from skilled PT for continued LE strengthening and balance training.     Pt will benefit from skilled therapeutic intervention in order to improve on the following deficits Abnormal gait;Decreased coordination;Difficulty walking;Decreased safety awareness;Decreased endurance;Decreased activity tolerance;Decreased balance;Decreased strength   Rehab Potential Good   PT Frequency 2x / week   PT Duration 8 weeks   PT Treatment/Interventions ADLs/Self Care Home Management;Moist Heat;Therapeutic activities;Patient/family education;Therapeutic exercise;Ultrasound;Manual techniques;Balance training;Functional mobility training;Cryotherapy;Neuromuscular re-education   PT Next Visit Plan Sit to stand exercises. Plan D/C next week to HEP.   Consulted and Agree with Plan of Care Patient        Problem List Patient Active Problem List   Diagnosis Date Noted  . Memory loss 04/05/2014  . Type II or unspecified type diabetes mellitus with neurological manifestations, uncontrolled 01/24/2014  . Chronic renal insufficiency,  stage II (mild) 03/21/2013  . Encounter for long-term (current) use of other medications 07/29/2012  . Diabetes 04/28/2012  . CVA (cerebral vascular accident)   . EDEMA- LOCALIZED 01/06/2011  . Chronic systolic heart failure 11/94/1740  . GERD 04/30/2010  . TRANSIENT ISCHEMIC ATTACKS, HX OF 04/30/2010  . DYSPEPSIA 04/14/2010  . WEAKNESS 03/19/2010  .  CONSTIPATION 02/13/2010  . ANEMIA, MILD 01/14/2010  . HYPERLIPIDEMIA-MIXED 09/16/2008  . HYPERTENSION, BENIGN 09/16/2008  . CAD, AUTOLOGOUS BYPASS GRAFT 09/16/2008  . SICK SINUS/ TACHY-BRADY SYNDROME 09/16/2008  . PACEMAKER, PERMANENT 09/16/2008  . CAD 10/25/2007  . Hypothyroidism 06/14/2007  . COLONIC POLYPS, HX OF 03/23/2007   Reginal Lutes, SPT 05/23/2015 9:57 AM  During this treatment session, the therapist was present, participating in, and directing the treatment. TAKACS,KELLY 05/23/2015, 9:57 AM   [facility] [address], [place], [place], [address] Phone: [phone]   Fax:  [phone]

## 2015-05-29 ENCOUNTER — Ambulatory Visit: Payer: Medicare Other | Attending: Internal Medicine

## 2015-05-29 ENCOUNTER — Ambulatory Visit (INDEPENDENT_AMBULATORY_CARE_PROVIDER_SITE_OTHER): Payer: Medicare Other | Admitting: Endocrinology

## 2015-05-29 ENCOUNTER — Encounter: Payer: Self-pay | Admitting: Endocrinology

## 2015-05-29 VITALS — BP 120/88 | HR 85 | Temp 98.0°F | Resp 16 | Ht 66.0 in | Wt 207.0 lb

## 2015-05-29 DIAGNOSIS — R2681 Unsteadiness on feet: Secondary | ICD-10-CM | POA: Insufficient documentation

## 2015-05-29 DIAGNOSIS — N189 Chronic kidney disease, unspecified: Secondary | ICD-10-CM

## 2015-05-29 DIAGNOSIS — R269 Unspecified abnormalities of gait and mobility: Secondary | ICD-10-CM | POA: Diagnosis not present

## 2015-05-29 DIAGNOSIS — M545 Low back pain: Secondary | ICD-10-CM | POA: Diagnosis not present

## 2015-05-29 DIAGNOSIS — R29898 Other symptoms and signs involving the musculoskeletal system: Secondary | ICD-10-CM | POA: Diagnosis not present

## 2015-05-29 DIAGNOSIS — E1122 Type 2 diabetes mellitus with diabetic chronic kidney disease: Secondary | ICD-10-CM | POA: Diagnosis not present

## 2015-05-29 LAB — POCT GLYCOSYLATED HEMOGLOBIN (HGB A1C): Hemoglobin A1C: 6.7

## 2015-05-29 MED ORDER — SITAGLIPTIN PHOSPHATE 100 MG PO TABS: ORAL_TABLET | ORAL | Status: DC

## 2015-05-29 NOTE — Progress Notes (Signed)
Subjective:    Patient ID: [name], female    DOB: 29-Sep-1925, 81 y.o.   MRN: [id number]  HPI Pt returns for f/u of diabetes mellitus: DM type: 2 Dx'ed: 0626 Complications: sensory neuropathy, CVA, renal insuff, and CAD.  Therapy: 6 oral meds.   GDM: never DKA: never Severe hypoglycemia: never Pancreatitis: never Other: she is not on metformin, due to CHF, she is not on actos, due to edema; she is off amaryl, due to renal insuff.  Pt says she'll take insulin if necessary Interval history:  History is obtained from patient and dtr.  no cbg record, but dtr states cbg's are well-controlled.   Past Medical History  Diagnosis Date  . Sliding hiatal hernia   . Erosive esophagitis   . Peptic stricture of esophagus   . Diverticulosis   . Arthritis   . Hypothyroidism   . Diabetes mellitus     Type I  . Coronary artery disease     s/p CABG 2001  . Sick sinus syndrome     s/p Medtronic DDD pacemaker implanted; generator change 02/2012  . Hypertension   . Hyperlipidemia   . GERD (gastroesophageal reflux disease)   . Adenomatous colon polyp 07/2002  . CVA (cerebral vascular accident)     Dr [name], Neurology  . Obesity   . Paroxysmal atrial fibrillation 02/04/15    discovered on PPM remote interrogation 6/16    Past Surgical History  Procedure Laterality Date  . Pacemaker placement  03/25/2000    by Dr Olevia Perches  . Tonsillectomy and adenoidectomy    . Appendectomy    . Cholecystectomy    . Total abdominal hysterectomy w/ bilateral salpingoophorectomy      Endometiosis  . Replacement total knee  2004    Right  . Colonoscopy w/ polypectomy  2003  . Cataract extraction  05/2005  . Coronary artery bypass graft  02/2000    5 vessel  . Rotator cuff repair    . Permanent pacemaker generator change  03/18/2012    PPM gen change by Dr Rayann Heman    History   Social History  . Marital Status: Widowed    Spouse Name: N/A  . Number of Children: 1  . Years of Education: N/A    Occupational History  . Retired    Social History Main Topics  . Smoking status: Former Research scientist (life sciences)  . Smokeless tobacco: Former Systems developer    Quit date: 11/23/1981     Comment: quit 28+ yrs ago (as of 2012)  . Alcohol Use: No  . Drug Use: No  . Sexual Activity: No   Other Topics Concern  . Not on file   Social History Narrative   Patient lives at home alone. Patient's daughter was with her [name] ) [phone]   Retired.   Education college   Right handed.   Caffeine one cup of coffee daily.   .    Current Outpatient Prescriptions on File Prior to Visit  Medication Sig Dispense Refill  . acarbose (PRECOSE) 50 MG tablet TAKE 1 TABLET BY MOUTH 3 TIMES A DAY WITH MEALS 90 tablet 11  . amitriptyline (ELAVIL) 25 MG tablet TAKE 1 TABLET (25 MG TOTAL) BY MOUTH AT BEDTIME. 90 tablet 2  . apixaban (ELIQUIS) 2.5 MG TABS tablet Take 1 tablet (2.5 mg total) by mouth 2 (two) times daily. 60 tablet 11  . atorvastatin (LIPITOR) 20 MG tablet TAKE 1 TABLET BY MOUTH EVERY DAY (IN PLACE OF PRAVASTATIN) 30  tablet 3  . bromocriptine (PARLODEL) 2.5 MG tablet TAKE 1 TABLET (2.5 MG TOTAL) BY MOUTH AT BEDTIME. 30 tablet 10  . Calcium Carbonate (CALCIUM 500 PO) Take 500 mg by mouth daily.     . colesevelam (WELCHOL) 625 MG tablet Take 3 tablets (1,875 mg total) by mouth 2 (two) times daily before a meal. 540 tablet 3  . diltiazem (CARDIZEM) 30 MG tablet Take 1 tablet (30 mg total) by mouth every 6 (six) hours as needed. 120 tablet 3  . donepezil (ARICEPT) 10 MG tablet Take 1 tablet (10 mg total) by mouth at bedtime. 90 tablet 3  . enalapril (VASOTEC) 20 MG tablet TAKE 1 TABLET BY MOUTH DAILY 90 tablet 1  . fish oil-omega-3 fatty acids 1000 MG capsule Take 1 g by mouth daily.    . INVOKANA 100 MG TABS tablet TAKE 1 TABLET BY MOUTH EVERY DAY 90 tablet 2  . levothyroxine (SYNTHROID, LEVOTHROID) 25 MCG tablet TAKE 1 AND 1/2 TABET BY MOUTH EVERY DAY 135 tablet 3  . memantine (NAMENDA XR) 28 MG CP24 24 hr  capsule Take 1 capsule (28 mg total) by mouth daily. 90 capsule 3  . metoprolol (TOPROL-XL) 200 MG 24 hr tablet TAKE 1 TABLET BY MOUTH EVERY DAY 30 tablet 5  . naproxen sodium (ANAPROX) 220 MG tablet Take 220 mg by mouth 2 (two) times daily as needed.    . niacin 500 MG tablet Take 500 mg by mouth daily with breakfast.     . nitroGLYCERIN (NITROSTAT) 0.3 MG SL tablet Place 1 tablet (0.3 mg total) under the tongue every 5 (five) minutes as needed. For chest pain 90 tablet 3  . pantoprazole (PROTONIX) 40 MG tablet Take 1 tablet (40 mg total) by mouth daily. 90 tablet 1  . repaglinide (PRANDIN) 2 MG tablet TAKE 1 TABLET (2 MG TOTAL) BY MOUTH 3 (THREE) TIMES DAILY BEFORE MEALS. 90 tablet 11   No current facility-administered medications on file prior to visit.    Allergies  Allergen Reactions  . Codeine Rash    Family History  Problem Relation Age of Onset  . Colon cancer Mother 14  . Breast cancer Mother   . Transient ischemic attack Mother   . Cancer Mother   . Hypertension Mother   . Stroke Mother   . Diabetes Mother   . Colon cancer Brother 39  . Cancer Brother   . Hypertension Brother   . Heart attack Brother   . Breast cancer Sister   . Cancer Sister   . Diabetes Sister   . Prostate cancer Brother   . Heart attack Brother   . Heart disease      3 brothers had MI; 1 pre 56  . Hypertension Sister   . Heart attack Daughter   . Diabetes Maternal Aunt     BP 120/88 mmHg  Pulse 85  Temp(Src) 98 F (36.7 C) (Oral)  Resp 16  Ht 5\' 6"  (1.676 m)  Wt 207 lb (93.895 kg)  BMI 33.43 kg/m2  SpO2 96%    Review of Systems dtr denies pt has hypoglycemia    Objective:   Physical Exam VITAL SIGNS:  See vs page GENERAL: no distress SKIN: no ulcer on the feet. feet are of normal color and temp  EXTEMITIES: no deformity. no ulcer on the feet.  Trace bilat leg edema. There are bilat varicosities. there is a vein harvest scar on the right leg.  PULSES: dorsalis pedis intact  bilat.  NEURO: sensation is intact to touch on the feet, but decreased from normal.    Lab Results  Component Value Date   CREATININE 1.56* 05/20/2015   BUN 29* 05/20/2015   NA 135 05/20/2015   K 4.3 05/20/2015   CL 104 05/20/2015   CO2 26 05/20/2015   a1c=6.7    Assessment & Plan:  DM: well-controlled. Renal insufficiency: in this setting, she needs a reduced dosage of januvia.  Patient is advised the following: Patient Instructions  check your blood sugar once a day.  vary the time of day when you check, between before the 3 meals, and at bedtime.  also check if you have symptoms of your blood sugar being too high or too low.  please keep a record of the readings and bring it to your next appointment here.  please call us sooner if your blood sugar goes below 70, or if you have a lot of readings over 200.   Please reduce the januvia to 1/2 pill per day. Please come back for a follow-up appointment in 4 months.

## 2015-05-29 NOTE — Therapy (Signed)
[facility] [address], [place], [place], [address] Phone: [phone]   Fax:  [phone]  Physical Therapy Treatment  Patient Details  Name: [name] MRN: [id number] Date of Birth: 09-24-1925 Referring Provider:  Hendricks Limes, MD  Encounter Date: 05/29/2015      PT End of Session - 05/29/15 0904    Visit Number 14   Number of Visits 20   Date for PT Re-Evaluation 06/03/15   PT Start Time 0846   PT Stop Time 0933   PT Time Calculation (min) 47 min   Activity Tolerance Patient tolerated treatment well   Behavior During Therapy [facility] for tasks assessed/performed      Past Medical History  Diagnosis Date  . Sliding hiatal hernia   . Erosive esophagitis   . Peptic stricture of esophagus   . Diverticulosis   . Arthritis   . Hypothyroidism   . Diabetes mellitus     Type I  . Coronary artery disease     s/p CABG 2001  . Sick sinus syndrome     s/p Medtronic DDD pacemaker implanted; generator change 02/2012  . Hypertension   . Hyperlipidemia   . GERD (gastroesophageal reflux disease)   . Adenomatous colon polyp 07/2002  . CVA (cerebral vascular accident)     Dr Evelena Leyden, Neurology  . Obesity   . Paroxysmal atrial fibrillation 02/04/15    discovered on PPM remote interrogation 6/16    Past Surgical History  Procedure Laterality Date  . Pacemaker placement  03/25/2000    by Dr Olevia Perches  . Tonsillectomy and adenoidectomy    . Appendectomy    . Cholecystectomy    . Total abdominal hysterectomy w/ bilateral salpingoophorectomy      Endometiosis  . Replacement total knee  2004    Right  . Colonoscopy w/ polypectomy  2003  . Cataract extraction  05/2005  . Coronary artery bypass graft  02/2000    5 vessel  . Rotator cuff repair    . Permanent pacemaker generator change  03/18/2012    PPM gen change by Dr Rayann Heman    There were no vitals filed for this visit.  Visit Diagnosis:  Abnormality of  gait  Weakness of both legs  Gait instability      Subjective Assessment - 05/29/15 0856    Subjective Feeling tired today, just woke up. Reports  doing some balance exercises at home.    Pertinent History Rt TKA, CVA   Limitations Standing   Patient Stated Goals improve gait and balance   Currently in Pain? No/denies            [facility] PT Assessment - 05/29/15 0001    Timed Up and Go Test   TUG Normal TUG   Normal TUG (seconds) 43  rollator walker, 58 seconds 1st trial                     [facility] Adult PT Treatment/Exercise - 05/29/15 0001    Ambulation/Gait   Ambulation/Gait --   Ambulation/Gait Assistance --   Ambulation/Gait Assistance Details --   Ambulation Surface --   Knee/Hip Exercises: Standing   Other Standing Knee Exercises tandem stance,30 sec x 2; alternate legs, 1 finger support   narrow BOS with eyes closed; 30 seconds x 2 with CGA    Other Standing Knee Exercises standing abduction/ 10 reps x 3   required continual VC for proper form  Knee/Hip Exercises: Seated   Long Arc Quad Strengthening;Both;10 reps;3 sets   [employer] Weight 3 lbs.   Other Seated Knee/Hip Exercises Marching with 3 lbs weight; 1 min x 3    Other Seated Knee/Hip Exercises sit to stand x 5, 2 sets; last set able to do 7  supervision required    Shoulder Exercises: ROM/Strengthening   UBE (Upper Arm Bike) 6 min forward/backwards   resistance 1                  PT Short Term Goals - 05/29/15 0854    PT SHORT TERM GOAL #1   Title be independent in initial HEP  Reports doing balance exercises at home.    Time 4   Period Weeks   Status On-going           PT Long Term Goals - 05/29/15 0914    PT LONG TERM GOAL #3   Title perform TUG in < or = to 35 seconds to improve safety  Performed in 44 seconds after 2nd trial with rollator walking and  UE support for sit to stand   Time 8   Period Weeks   Status On-going   PT LONG TERM GOAL #4   Title  improve LE strength to perform sit to stand with moderate UE support  Performed 5x sit to stand with no UE support in clinic; reports using arm support at home out of habit    Time 8   Period Weeks   Status Achieved               Plan - 05/29/15 9024    Clinical Impression Statement Able to tolerate 10 minutes of standing exercises today without seated rest breaks; demonstrated improvement in LE strength with performing sit to stand without use of UE. Increased time to perform TUG  due to managing rollator walker. Decreased postural sway with narrow BOS and tandem stance balance with one finger support and supervision only. Will benefit from continued balance, strengthening and ambulation training.     Pt will benefit from skilled therapeutic intervention in order to improve on the following deficits Abnormal gait;Decreased coordination;Difficulty walking;Decreased safety awareness;Decreased endurance;Decreased activity tolerance;Decreased balance;Decreased strength   Rehab Potential Good   PT Frequency 2x / week   PT Duration 8 weeks   PT Treatment/Interventions ADLs/Self Care Home Management;Moist Heat;Therapeutic activities;Patient/family education;Therapeutic exercise;Ultrasound;Manual techniques;Balance training;Functional mobility training;Cryotherapy;Neuromuscular re-education   PT Next Visit Plan Try TUG in a more open area, continue with seated exercises, standing tandem balance without UE support, walking forwards/backwards    PT Home Exercise Plan Provide sit to stand for HEP?    Consulted and Agree with Plan of Care Patient        Problem List Patient Active Problem List   Diagnosis Date Noted  . Memory loss 04/05/2014  . Type II or unspecified type diabetes mellitus with neurological manifestations, uncontrolled 01/24/2014  . Chronic renal insufficiency, stage II (mild) 03/21/2013  . Encounter for long-term (current) use of other medications 07/29/2012  . Diabetes  04/28/2012  . CVA (cerebral vascular accident)   . EDEMA- LOCALIZED 01/06/2011  . Chronic systolic heart failure 09/73/5329  . GERD 04/30/2010  . TRANSIENT ISCHEMIC ATTACKS, HX OF 04/30/2010  . DYSPEPSIA 04/14/2010  . WEAKNESS 03/19/2010  . CONSTIPATION 02/13/2010  . ANEMIA, MILD 01/14/2010  . HYPERLIPIDEMIA-MIXED 09/16/2008  . HYPERTENSION, BENIGN 09/16/2008  . CAD, AUTOLOGOUS BYPASS GRAFT 09/16/2008  . SICK SINUS/ TACHY-BRADY SYNDROME 09/16/2008  .  PACEMAKER, PERMANENT 09/16/2008  . CAD 10/25/2007  . Hypothyroidism 06/14/2007  . COLONIC POLYPS, HX OF 03/23/2007   Reginal Lutes, SPT 05/29/2015 10:36 AM   During this treatment session, the therapist was present, participating in, and directing the treatment. TAKACS,KELLY, PT 05/29/2015, 10:36 AM  [facility] [address], [place], [place], [address] Phone: [phone]   Fax:  [phone]

## 2015-05-29 NOTE — Patient Instructions (Addendum)
check your blood sugar once a day.  vary the time of day when you check, between before the 3 meals, and at bedtime.  also check if you have symptoms of your blood sugar being too high or too low.  please keep a record of the readings and bring it to your next appointment here.  please call us sooner if your blood sugar goes below 70, or if you have a lot of readings over 200.   Please reduce the januvia to 1/2 pill per day. Please come back for a follow-up appointment in 4 months.

## 2015-05-31 ENCOUNTER — Ambulatory Visit (INDEPENDENT_AMBULATORY_CARE_PROVIDER_SITE_OTHER): Payer: Medicare Other | Admitting: Podiatry

## 2015-05-31 ENCOUNTER — Ambulatory Visit: Payer: Medicare Other | Admitting: Physical Therapy

## 2015-05-31 ENCOUNTER — Encounter: Payer: Self-pay | Admitting: Podiatry

## 2015-05-31 DIAGNOSIS — M79606 Pain in leg, unspecified: Secondary | ICD-10-CM | POA: Diagnosis not present

## 2015-05-31 DIAGNOSIS — R269 Unspecified abnormalities of gait and mobility: Secondary | ICD-10-CM

## 2015-05-31 DIAGNOSIS — B351 Tinea unguium: Secondary | ICD-10-CM | POA: Diagnosis not present

## 2015-05-31 DIAGNOSIS — R2681 Unsteadiness on feet: Secondary | ICD-10-CM

## 2015-05-31 DIAGNOSIS — R29898 Other symptoms and signs involving the musculoskeletal system: Secondary | ICD-10-CM | POA: Diagnosis not present

## 2015-05-31 DIAGNOSIS — M545 Low back pain: Secondary | ICD-10-CM

## 2015-05-31 NOTE — Therapy (Signed)
[facility] [address], [place], [place], [address] Phone: [phone]   Fax:  [phone]  Physical Therapy Treatment  Patient Details  Name: [name] MRN: [id number] Date of Birth: 01/07/1925 Referring Provider:  Hendricks Limes, MD  Encounter Date: 05/31/2015      PT End of Session - 05/31/15 1045    Visit Number 15   Number of Visits 20   Date for PT Re-Evaluation 06/03/15   PT Start Time 0847   PT Stop Time 0932   PT Time Calculation (min) 45 min   Activity Tolerance Patient tolerated treatment well   Behavior During Therapy [facility] for tasks assessed/performed      Past Medical History  Diagnosis Date  . Sliding hiatal hernia   . Erosive esophagitis   . Peptic stricture of esophagus   . Diverticulosis   . Arthritis   . Hypothyroidism   . Diabetes mellitus     Type I  . Coronary artery disease     s/p CABG 2001  . Sick sinus syndrome     s/p Medtronic DDD pacemaker implanted; generator change 02/2012  . Hypertension   . Hyperlipidemia   . GERD (gastroesophageal reflux disease)   . Adenomatous colon polyp 07/2002  . CVA (cerebral vascular accident)     Dr Evelena Leyden, Neurology  . Obesity   . Paroxysmal atrial fibrillation 02/04/15    discovered on PPM remote interrogation 6/16    Past Surgical History  Procedure Laterality Date  . Pacemaker placement  03/25/2000    by Dr Olevia Perches  . Tonsillectomy and adenoidectomy    . Appendectomy    . Cholecystectomy    . Total abdominal hysterectomy w/ bilateral salpingoophorectomy      Endometiosis  . Replacement total knee  2004    Right  . Colonoscopy w/ polypectomy  2003  . Cataract extraction  05/2005  . Coronary artery bypass graft  02/2000    5 vessel  . Rotator cuff repair    . Permanent pacemaker generator change  03/18/2012    PPM gen change by Dr Rayann Heman    There were no vitals filed for this visit.  Visit Diagnosis:  Abnormality of  gait  Weakness of both legs  Gait instability  Right low back pain, with sciatica presence unspecified      Subjective Assessment - 05/31/15 1053    Subjective Pt reports now able to use her bathroom without rollator walker, due to increased strength and confidence with ambulation, transfer sit to stand has improved, pt is living alone.   Patient is accompained by: Family member  daughter   Pertinent History Rt TKA, CVA   Limitations Standing   How long can you stand comfortably? able to stand 1.5 hours cooking the other day   How long can you walk comfortably? Able to walk around the house; can walk for 20 minutes    Diagnostic tests none recent   Patient Stated Goals improve gait and balance   Currently in Pain? No/denies   Multiple Pain Sites No            [facility] PT Assessment - 05/31/15 0001    Assessment   Medical Diagnosis spinal stenosis (M48.06), lumbar region with neurogenic claudication. Primary OA of both knees(M17.0)   Onset Date/Surgical Date 03/19/15   Observation/Other Assessments   Focus on Therapeutic Outcomes (FOTO)  53% limitations   Ambulation/Gait   Ambulation/Gait Yes   Ambulation/Gait Assistance 6:  Modified independent (Device/Increase time)  using 4wheel walker   Ambulation Distance (Feet) --  all distance needed in gym around 236feet   Ambulation Surface Level   Gait Comments difficulties with changing surfaces tiles to carpet   Timed Up and Go Test   TUG Normal TUG   Normal TUG (seconds) 31   31 mainly difficult due to manoever rollator walker                     [facility] Adult PT Treatment/Exercise - 05/31/15 0001    Standardized Balance Assessment   Standardized Balance Assessment Timed Up and Go Test   Timed Up and Go Test   TUG Normal TUG   Normal TUG (seconds) 31   Knee/Hip Exercises: Standing   Other Standing Knee Exercises tandem stance,30 sec x 2; alternate legs, 1 finger support    Other Standing Knee Exercises  standing abduction/ 10 reps x 3    Knee/Hip Exercises: Seated   Long Arc Quad Strengthening;Both;10 reps;3 sets   Long Arc Quad Weight 3 lbs.   Other Seated Knee/Hip Exercises sit to stand x 5, 2 sets; last set able to do 7   Shoulder Exercises: ROM/Strengthening   UBE (Upper Arm Bike) 6 min forward/backwards   resistance 1                  PT Short Term Goals - 05/31/15 0935    PT SHORT TERM GOAL #1   Title be independent in initial HEP   Time 4   Period Weeks   Status Achieved   PT SHORT TERM GOAL #2   Title ambulate safely with a rolling walker to improve independence at home   Time 4   Period Weeks   Status Achieved   PT SHORT TERM GOAL #3   Title perform TUG in < or = to 40 seconds to improve safety   Time 4   Period Weeks   Status Achieved           PT Long Term Goals - 05/31/15 0931    PT LONG TERM GOAL #1   Title be independent in advanced HEP   Time 8   Period Weeks   Status Achieved   PT LONG TERM GOAL #2   Title reduce FOTO to < or = to 55% limitation   Time 8   Status Achieved   PT LONG TERM GOAL #3   Title perform TUG in < or = to 35 seconds to improve safety   Time 8   Period Weeks   Status Achieved   PT LONG TERM GOAL #4   Title improve LE strength to perform sit to stand with moderate UE support   Time 8   Period Weeks   Status Achieved   PT LONG TERM GOAL #5   Title report a 25% reduction in low back pain with standing tasks   Time 8   Period Weeks   Status Achieved               Plan - 05/31/15 0935    Clinical Impression Statement Pt able to tolerate longer periods of standing with standing activities during PT, pt shows improvement in transfers sit to stand due to improve LE strength. Pt able to perform TUG in 31 sec, and 39 sec needs increased time mainly due to managing rollator walker. Decreased postural sway with narrow BOS and tandem stance balance with 2 finger support and close supervision.  Pt will benefit  from skilled therapeutic intervention in order to improve on the following deficits Abnormal gait;Decreased coordination;Difficulty walking;Decreased safety awareness;Decreased endurance;Decreased activity tolerance;Decreased balance;Decreased strength   Rehab Potential Good   PT Frequency 2x / week   PT Duration 6 weeks   PT Treatment/Interventions ADLs/Self Care Home Management;Moist Heat;Therapeutic activities;Patient/family education;Therapeutic exercise;Ultrasound;Manual techniques;Balance training;Functional mobility training;Cryotherapy;Neuromuscular re-education   PT Next Visit Plan D/C    PT Home Exercise Plan current HEP   Consulted and Agree with Plan of Care Patient          G-Codes - Jun 02, 2015 1200    Functional Assessment Tool Used FOTO 53% limitation   Other PT Primary Current Status (O7078) At least 40 percent but less than 60 percent impaired, limited or restricted   Other PT Primary Goal Status (M7544) At least 40 percent but less than 60 percent impaired, limited or restricted      Problem List Patient Active Problem List   Diagnosis Date Noted  . Memory loss 04/05/2014  . Type II or unspecified type diabetes mellitus with neurological manifestations, uncontrolled 01/24/2014  . Chronic renal insufficiency, stage II (mild) 03/21/2013  . Encounter for long-term (current) use of other medications 07/29/2012  . Diabetes 04/28/2012  . CVA (cerebral vascular accident)   . EDEMA- LOCALIZED 01/06/2011  . Chronic systolic heart failure 92/11/69  . GERD 04/30/2010  . TRANSIENT ISCHEMIC ATTACKS, HX OF 04/30/2010  . DYSPEPSIA 04/14/2010  . WEAKNESS 03/19/2010  . CONSTIPATION 02/13/2010  . ANEMIA, MILD 01/14/2010  . HYPERLIPIDEMIA-MIXED 09/16/2008  . HYPERTENSION, BENIGN 09/16/2008  . CAD, AUTOLOGOUS BYPASS GRAFT 09/16/2008  . SICK SINUS/ TACHY-BRADY SYNDROME 09/16/2008  . PACEMAKER, PERMANENT 09/16/2008  . CAD 10/25/2007  . Hypothyroidism 06/14/2007  . COLONIC  POLYPS, HX OF 03/23/2007   Earlie Counts, PT 06-02-15 12:03 PM   GRAY,CHERYL PTA Jun 02, 2015, 12:03 PM  [facility] [address] [place], [place], [address] Phone: [phone]   Fax:  [phone]  PHYSICAL THERAPY DISCHARGE SUMMARY  Visits from Start of Care: 15 Current functional level related to goals / functional outcomes: See above for for assessment of goals.  Patient has met her goals.    Remaining deficits: None   Education / Equipment: HEP  Plan: Patient agrees to discharge.  Patient goals were met. Patient is being discharged due to meeting the stated rehab goals. Thank you for the referral.  Earlie Counts, PT 02-Jun-2015 12:03 PM   ?????

## 2015-06-01 DIAGNOSIS — M1712 Unilateral primary osteoarthritis, left knee: Secondary | ICD-10-CM | POA: Diagnosis not present

## 2015-06-01 NOTE — Progress Notes (Signed)
Subjective:     Patient ID: [name], female   DOB: [birth year], 81 y.o.   MRN: [id number]  HPI patient presents with painful nailbeds 1-5 both feet that are very thick and she cannot cut herself and they can become painful   Review of Systems     Objective:   Physical Exam  neurovascular status intact with thick yellow brittle nailbeds 1-5 both feet that are painful incurvated and she cannot cut    Assessment:      mycotic nail infections with pain 1-5 both feet    Plan:      debride painful nailbeds 1-5 both feet with no iatrogenic bleeding noted

## 2015-06-06 ENCOUNTER — Other Ambulatory Visit: Payer: Self-pay | Admitting: Emergency Medicine

## 2015-06-06 MED ORDER — ATORVASTATIN CALCIUM 20 MG PO TABS: ORAL_TABLET | ORAL | Status: DC

## 2015-06-11 ENCOUNTER — Other Ambulatory Visit: Payer: Self-pay | Admitting: *Deleted

## 2015-06-11 ENCOUNTER — Encounter: Payer: Self-pay | Admitting: Internal Medicine

## 2015-06-11 DIAGNOSIS — I10 Essential (primary) hypertension: Secondary | ICD-10-CM

## 2015-06-11 DIAGNOSIS — I480 Paroxysmal atrial fibrillation: Secondary | ICD-10-CM

## 2015-06-12 ENCOUNTER — Other Ambulatory Visit: Payer: Self-pay | Admitting: Endocrinology

## 2015-06-12 ENCOUNTER — Other Ambulatory Visit: Payer: Self-pay | Admitting: Internal Medicine

## 2015-06-13 ENCOUNTER — Other Ambulatory Visit: Payer: Self-pay

## 2015-06-13 ENCOUNTER — Ambulatory Visit (HOSPITAL_COMMUNITY): Payer: Medicare Other | Attending: Nurse Practitioner

## 2015-06-13 DIAGNOSIS — E119 Type 2 diabetes mellitus without complications: Secondary | ICD-10-CM | POA: Diagnosis not present

## 2015-06-13 DIAGNOSIS — I10 Essential (primary) hypertension: Secondary | ICD-10-CM | POA: Diagnosis not present

## 2015-06-13 DIAGNOSIS — I340 Nonrheumatic mitral (valve) insufficiency: Secondary | ICD-10-CM | POA: Diagnosis not present

## 2015-06-13 DIAGNOSIS — I358 Other nonrheumatic aortic valve disorders: Secondary | ICD-10-CM | POA: Insufficient documentation

## 2015-06-13 DIAGNOSIS — E669 Obesity, unspecified: Secondary | ICD-10-CM | POA: Insufficient documentation

## 2015-06-13 DIAGNOSIS — I351 Nonrheumatic aortic (valve) insufficiency: Secondary | ICD-10-CM | POA: Insufficient documentation

## 2015-06-13 DIAGNOSIS — I480 Paroxysmal atrial fibrillation: Secondary | ICD-10-CM

## 2015-06-13 DIAGNOSIS — E785 Hyperlipidemia, unspecified: Secondary | ICD-10-CM | POA: Insufficient documentation

## 2015-06-13 DIAGNOSIS — Z6833 Body mass index (BMI) 33.0-33.9, adult: Secondary | ICD-10-CM | POA: Diagnosis not present

## 2015-06-14 ENCOUNTER — Telehealth: Payer: Self-pay | Admitting: *Deleted

## 2015-06-14 NOTE — Telephone Encounter (Signed)
S/w pt's daughter Kathi Ludwig on file. [name] notified of echo results for pt and that pt is ok for surgery. Daughter said she will let her mom know.

## 2015-06-18 ENCOUNTER — Other Ambulatory Visit: Payer: Self-pay | Admitting: Surgical

## 2015-06-18 MED ORDER — BUPIVACAINE LIPOSOME 1.3 % IJ SUSP: 20.0000 mL | Freq: Once | INTRAMUSCULAR | Status: DC

## 2015-06-19 ENCOUNTER — Ambulatory Visit: Payer: Medicare Other | Admitting: Pharmacist

## 2015-06-19 ENCOUNTER — Ambulatory Visit (INDEPENDENT_AMBULATORY_CARE_PROVIDER_SITE_OTHER): Payer: Medicare Other | Admitting: *Deleted

## 2015-06-19 DIAGNOSIS — I480 Paroxysmal atrial fibrillation: Secondary | ICD-10-CM

## 2015-06-19 LAB — BASIC METABOLIC PANEL
BUN: 40 mg/dL — AB (ref 6–23)
CHLORIDE: 104 meq/L (ref 96–112)
CO2: 28 meq/L (ref 19–32)
Calcium: 9.9 mg/dL (ref 8.4–10.5)
Creatinine, Ser: 1.57 mg/dL — ABNORMAL HIGH (ref 0.40–1.20)
GFR: 33.08 mL/min — ABNORMAL LOW (ref >60.00)
GLUCOSE: 131 mg/dL — AB (ref 70–99)
Potassium: 4.7 mEq/L (ref 3.5–5.1)
Sodium: 137 mEq/L (ref 135–145)

## 2015-06-19 LAB — CBC
HCT: 37.7 % (ref 36.0–46.0)
Hemoglobin: 12.7 g/dL (ref 12.0–15.0)
MCHC: 33.6 g/dL (ref 30.0–36.0)
MCV: 88.3 fl (ref 78.0–100.0)
Platelets: 184 10*3/uL (ref 150.0–400.0)
RBC: 4.27 Mil/uL (ref 3.87–5.11)
RDW: 13.2 % (ref 11.5–15.5)
WBC: 8.9 10*3/uL (ref 4.0–10.5)

## 2015-06-19 NOTE — Progress Notes (Addendum)
Pt was started on Eliquis for Afib on 05/20/15.    Reviewed patients medication list.  Pt is not  currently on any combined P-gp and strong CYP3A4 inhibitors/inducers (ketoconazole, traconazole, ritonavir, carbamazepine, phenytoin, rifampin, St. John's wort).  Reviewed labs.  SCr 1.57,  Weight 92 kg, Age 81 yrs.  Dose of 2.5mg s BID is  appropriate  based on SCr and age.  Hgb and HCT  Are 12.7 and 37.7.   A full discussion of the nature of anticoagulants has been carried out.  A benefit/risk analysis has been presented to the patient, so that they understand the justification for choosing anticoagulation with Eliquis at this time.  The need for compliance is stressed.  Pt is aware to take the medication twice daily.  Side effects of potential bleeding are discussed, including unusual colored urine or stools, coughing up blood or coffee ground emesis, nose bleeds or serious fall or head trauma.  Discussed signs and symptoms of stroke. The patient should avoid any OTC items containing aspirin or ibuprofen.  Avoid alcohol consumption.   Call if any signs of abnormal bleeding.  Discussed financial obligations and resolved any difficulty in obtaining medication.  Next lab test test in 6 months.   Pt having left knee replacement on 07/19/15 with Dr Fransico Michael. , pt has been cleared for surgery.

## 2015-06-25 ENCOUNTER — Encounter: Payer: Self-pay | Admitting: Nurse Practitioner

## 2015-07-10 NOTE — H&P (Signed)
TOTAL KNEE ADMISSION H&P  Patient is being admitted for left total knee arthroplasty.  Subjective:  Chief Complaint:left knee pain.  HPI: [name], 81 y.o. female, has a history of pain and functional disability in the left knee due to arthritis and has failed non-surgical conservative treatments for greater than 12 weeks to includeNSAID's and/or analgesics, corticosteriod injections, use of assistive devices and activity modification.  Onset of symptoms was gradual, starting >10 years ago with gradually worsening course since that time. The patient noted no past surgery on the left knee(s).  Patient currently rates pain in the left knee(s) at 8 out of 10 with activity. Patient has night pain, worsening of pain with activity and weight bearing, pain that interferes with activities of daily living, pain with passive range of motion, crepitus and joint swelling.  Patient has evidence of periarticular osteophytes and joint space narrowing by imaging studies. There is no active infection.  Patient Active Problem List   Diagnosis Date Noted  . Memory loss 04/05/2014  . Type II or unspecified type diabetes mellitus with neurological manifestations, uncontrolled 01/24/2014  . Chronic renal insufficiency, stage II (mild) 03/21/2013  . Encounter for long-term (current) use of other medications 07/29/2012  . Diabetes 04/28/2012  . CVA (cerebral vascular accident)   . EDEMA- LOCALIZED 01/06/2011  . Chronic systolic heart failure 05/16/7627  . GERD 04/30/2010  . TRANSIENT ISCHEMIC ATTACKS, HX OF 04/30/2010  . DYSPEPSIA 04/14/2010  . WEAKNESS 03/19/2010  . CONSTIPATION 02/13/2010  . ANEMIA, MILD 01/14/2010  . HYPERLIPIDEMIA-MIXED 09/16/2008  . HYPERTENSION, BENIGN 09/16/2008  . CAD, AUTOLOGOUS BYPASS GRAFT 09/16/2008  . SICK SINUS/ TACHY-BRADY SYNDROME 09/16/2008  . PACEMAKER, PERMANENT 09/16/2008  . CAD 10/25/2007  . Hypothyroidism 06/14/2007  . COLONIC POLYPS, HX OF 03/23/2007   Past  Medical History  Diagnosis Date  . Sliding hiatal hernia   . Erosive esophagitis   . Peptic stricture of esophagus   . Diverticulosis   . Arthritis   . Hypothyroidism   . Diabetes mellitus     Type I  . Coronary artery disease     s/p CABG 2001  . Sick sinus syndrome     s/p Medtronic DDD pacemaker implanted; generator change 02/2012  . Hypertension   . Hyperlipidemia   . GERD (gastroesophageal reflux disease)   . Adenomatous colon polyp 07/2002  . CVA (cerebral vascular accident)     Dr Evelena Leyden, Neurology  . Obesity   . Paroxysmal atrial fibrillation 02/04/15    discovered on PPM remote interrogation 6/16    Past Surgical History  Procedure Laterality Date  . Pacemaker placement  03/25/2000    by Dr Olevia Perches  . Tonsillectomy and adenoidectomy    . Appendectomy    . Cholecystectomy    . Total abdominal hysterectomy w/ bilateral salpingoophorectomy      Endometiosis  . Replacement total knee  2004    Right  . Colonoscopy w/ polypectomy  2003  . Cataract extraction  05/2005  . Coronary artery bypass graft  02/2000    5 vessel  . Rotator cuff repair    . Permanent pacemaker generator change  03/18/2012    PPM gen change by Dr Rayann Heman      Current outpatient prescriptions:  .  acarbose (PRECOSE) 50 MG tablet, TAKE 1 TABLET BY MOUTH 3 TIMES A DAY WITH MEALS, Disp: 90 tablet, Rfl: 11 .  amitriptyline (ELAVIL) 25 MG tablet, TAKE 1 TABLET (25 MG TOTAL) BY MOUTH AT BEDTIME., Disp: 90  tablet, Rfl: 2 .  apixaban (ELIQUIS) 2.5 MG TABS tablet, Take 1 tablet (2.5 mg total) by mouth 2 (two) times daily., Disp: 60 tablet, Rfl: 11 .  atorvastatin (LIPITOR) 20 MG tablet, TAKE 1 TABLET BY MOUTH EVERY DAY (IN PLACE OF PRAVASTATIN), Disp: 30 tablet, Rfl: 2 .  bromocriptine (PARLODEL) 2.5 MG tablet, TAKE 1 TABLET (2.5 MG TOTAL) BY MOUTH AT BEDTIME., Disp: 30 tablet, Rfl: 10 .  colesevelam (WELCHOL) 625 MG tablet, Take 3 tablets (1,875 mg total) by mouth 2 (two) times daily before a meal. (Patient  taking differently: Take 1,250 mg by mouth 3 (three) times daily. ), Disp: 540 tablet, Rfl: 3 .  donepezil (ARICEPT) 10 MG tablet, Take 1 tablet (10 mg total) by mouth at bedtime., Disp: 90 tablet, Rfl: 3 .  enalapril (VASOTEC) 20 MG tablet, TAKE 1 TABLET BY MOUTH DAILY, Disp: 90 tablet, Rfl: 1 .  INVOKANA 100 MG TABS tablet, TAKE 1 TABLET BY MOUTH EVERY DAY, Disp: 90 tablet, Rfl: 2 .  levothyroxine (SYNTHROID, LEVOTHROID) 25 MCG tablet, TAKE 1 AND 1/2 TABET BY MOUTH EVERY DAY, Disp: 135 tablet, Rfl: 3 .  memantine (NAMENDA XR) 28 MG CP24 24 hr capsule, Take 1 capsule (28 mg total) by mouth daily., Disp: 90 capsule, Rfl: 3 .  metoprolol (TOPROL-XL) 200 MG 24 hr tablet, TAKE 1 TABLET BY MOUTH EVERY DAY, Disp: 30 tablet, Rfl: 5 .  nitroGLYCERIN (NITROSTAT) 0.3 MG SL tablet, Place 1 tablet (0.3 mg total) under the tongue every 5 (five) minutes as needed. For chest pain, Disp: 90 tablet, Rfl: 3 .  pantoprazole (PROTONIX) 40 MG tablet, Take 1 tablet (40 mg total) by mouth daily., Disp: 90 tablet, Rfl: 1 .  repaglinide (PRANDIN) 2 MG tablet, TAKE 1 TABLET (2 MG TOTAL) BY MOUTH 3 (THREE) TIMES DAILY BEFORE MEALS., Disp: 90 tablet, Rfl: 11 .  sitaGLIPtin (JANUVIA) 100 MG tablet, 1/2 tab daily (Patient taking differently: Take 50 mg by mouth daily. 1/2 tab daily), Disp: 45 tablet, Rfl: 3 .  Calcium Carbonate (CALCIUM 500 PO), Take 500 mg by mouth daily. , Disp: , Rfl:  .  fish oil-omega-3 fatty acids 1000 MG capsule, Take 1 g by mouth daily., Disp: , Rfl:  .  niacin 500 MG tablet, Take 500 mg by mouth every evening. , Disp: , Rfl:     Allergies  Allergen Reactions  . Codeine Rash  . Robaxin [Methocarbamol] Other (See Comments)    Too strong     Social History  Substance Use Topics  . Smoking status: Former Research scientist (life sciences)  . Smokeless tobacco: Former Systems developer    Quit date: 11/23/1981     Comment: quit 28+ yrs ago (as of 2012)  . Alcohol Use: No    Family History  Problem Relation Age of Onset  . Colon  cancer Mother 33  . Breast cancer Mother   . Transient ischemic attack Mother   . Cancer Mother   . Hypertension Mother   . Stroke Mother   . Diabetes Mother   . Colon cancer Brother 42  . Cancer Brother   . Hypertension Brother   . Heart attack Brother   . Breast cancer Sister   . Cancer Sister   . Diabetes Sister   . Prostate cancer Brother   . Heart attack Brother   . Heart disease      3 brothers had MI; 1 pre 74  . Hypertension Sister   . Heart attack Daughter   . Diabetes  Maternal Aunt      Review of Systems  Constitutional: Negative for fever, chills, weight loss, malaise/fatigue and diaphoresis.  HENT: Negative.   Eyes: Negative.   Respiratory: Positive for shortness of breath. Negative for cough, hemoptysis, sputum production and wheezing.   Cardiovascular: Positive for palpitations and leg swelling. Negative for chest pain, orthopnea, claudication and PND.  Gastrointestinal: Positive for diarrhea. Negative for heartburn, nausea, vomiting, abdominal pain, constipation, blood in stool and melena.  Genitourinary: Positive for frequency. Negative for dysuria, urgency, hematuria and flank pain.  Musculoskeletal: Positive for myalgias, back pain and joint pain. Negative for falls and neck pain.  Skin: Negative.   Neurological: Positive for weakness. Negative for dizziness, tingling, tremors, sensory change, speech change, focal weakness, seizures and loss of consciousness.  Endo/Heme/Allergies: Negative.   Psychiatric/Behavioral: Positive for memory loss. Negative for depression, suicidal ideas, hallucinations and substance abuse. The patient is not nervous/anxious and does not have insomnia.     Objective:  Physical Exam  Constitutional: She is oriented to person, place, and time. She appears well-developed. No distress.  Overweight  HENT:  Head: Normocephalic and atraumatic.  Right Ear: External ear normal.  Left Ear: External ear normal.  Nose: Nose normal.   Mouth/Throat: Oropharynx is clear and moist.  Eyes: Conjunctivae and EOM are normal.  Neck: Normal range of motion. Neck supple.  Cardiovascular: Normal rate, regular rhythm, normal heart sounds and intact distal pulses.   No murmur heard. Respiratory: Effort normal and breath sounds normal. No respiratory distress. She has no wheezes.  GI: Soft. Bowel sounds are normal. She exhibits no distension. There is no tenderness.  Musculoskeletal:       Right hip: Normal.       Left hip: Normal.       Right knee: Normal.       Left knee: She exhibits decreased range of motion and swelling. She exhibits no effusion and no erythema. Tenderness found. Medial joint line and lateral joint line tenderness noted.  Neurological: She is alert and oriented to person, place, and time. She has normal strength and normal reflexes. No sensory deficit.  Skin: No rash noted. She is not diaphoretic. No erythema.  Psychiatric: She has a normal mood and affect. Her behavior is normal.   Vitals  Weight: 204 lb Height: 66in Body Surface Area: 2.02 m Body Mass Index: 32.93 kg/m  Pulse: 84 (Regular)  BP: 112/62 (Sitting, Left Arm, Standard)   Imaging Review Plain radiographs demonstrate severe degenerative joint disease of the left knee(s). The overall alignment ismild varus. The bone quality appears to be good for age and reported activity level.  Assessment/Plan:  End stage primary osteoarthritis, left knee   The patient history, physical examination, clinical judgment of the provider and imaging studies are consistent with end stage degenerative joint disease of the left knee(s) and total knee arthroplasty is deemed medically necessary. The treatment options including medical management, injection therapy arthroscopy and arthroplasty were discussed at length. The risks and benefits of total knee arthroplasty were presented and reviewed. The risks due to aseptic loosening, infection, stiffness, patella  tracking problems, thromboembolic complications and other imponderables were discussed. The patient acknowledged the explanation, agreed to proceed with the plan and consent was signed. Patient is being admitted for inpatient treatment for surgery, pain control, PT, OT, prophylactic antibiotics, VTE prophylaxis, progressive ambulation and ADL's and discharge planning. The patient is planning to be discharged to skilled nursing facility ([facility])  Topical TXA PCP: D. Linna Darner  Cardio: Dr. Marjory Sneddon, PA-C

## 2015-07-11 ENCOUNTER — Other Ambulatory Visit: Payer: Self-pay | Admitting: Internal Medicine

## 2015-07-12 ENCOUNTER — Other Ambulatory Visit (HOSPITAL_COMMUNITY): Payer: Self-pay | Admitting: *Deleted

## 2015-07-12 NOTE — Patient Instructions (Addendum)
[name]  07/12/2015   Your procedure is scheduled on: Friday August 26th, 2016  Report to [facility]  Entrance take [place]  elevators to 3rd floor to  [place] at 530 AM.  Call this number if you have problems the morning of surgery [phone]   Remember: ONLY 1 PERSON MAY GO WITH YOU TO SHORT STAY TO GET  READY MORNING OF [place].  Do not eat food or drink liquids :After Midnight.     Take these medicines the morning of surgery with A SIP OF WATER:  levothyroxine (synthroid), metoprolol, Namenda                             You may not have any metal on your body including hair pins and              piercings  Do not wear jewelry, make-up, lotions, powders or perfumes, deodorant             Do not wear nail polish.  Do not shave  48 hours prior to surgery.              Men may shave face and neck.   Do not bring valuables to the hospital. [place].  Contacts, dentures or bridgework may not be worn into surgery.  Leave suitcase in the car. After surgery it may be brought to your room.     Patients discharged the day of surgery will not be allowed to drive home.  Name and phone number of your driver:  Special Instructions: N/A              Please read over the following fact sheets you were given: _____________________________________________________________________             [facility] - Preparing for Surgery Before surgery, you can play an important role.  Because skin is not sterile, your skin needs to be as free of germs as possible.  You can reduce the number of germs on your skin by washing with CHG (chlorahexidine gluconate) soap before surgery.  CHG is an antiseptic cleaner which kills germs and bonds with the skin to continue killing germs even after washing. Please DO NOT use if you have an allergy to CHG or antibacterial soaps.  If your skin becomes reddened/irritated stop  using the CHG and inform your nurse when you arrive at Short Stay. Do not shave (including legs and underarms) for at least 48 hours prior to the first CHG shower.  You may shave your face/neck. Please follow these instructions carefully:  1.  Shower with CHG Soap the night before surgery and the  morning of Surgery.  2.  If you choose to wash your hair, wash your hair first as usual with your  normal  shampoo.  3.  After you shampoo, rinse your hair and body thoroughly to remove the  shampoo.                           4.  Use CHG as you would any other liquid soap.  You can apply chg directly  to the skin and wash  Gently with a scrungie or clean washcloth.  5.  Apply the CHG Soap to your body ONLY FROM THE NECK DOWN.   Do not use on face/ open                           Wound or open sores. Avoid contact with eyes, ears mouth and genitals (private parts).                       Wash face,  Genitals (private parts) with your normal soap.             6.  Wash thoroughly, paying special attention to the area where your surgery  will be performed.  7.  Thoroughly rinse your body with warm water from the neck down.  8.  DO NOT shower/wash with your normal soap after using and rinsing off  the CHG Soap.                9.  Pat yourself dry with a clean towel.            10.  Wear clean pajamas.            11.  Place clean sheets on your bed the night of your first shower and do not  sleep with pets. Day of Surgery : Do not apply any lotions/deodorants the morning of surgery.  Please wear clean clothes to the hospital/surgery center.  FAILURE TO FOLLOW THESE INSTRUCTIONS MAY RESULT IN THE CANCELLATION OF YOUR SURGERY PATIENT SIGNATURE_________________________________  NURSE SIGNATURE__________________________________  ________________________________________________________________________  WHAT IS A BLOOD TRANSFUSION? Blood Transfusion Information  A transfusion is the  replacement of blood or some of its parts. Blood is made up of multiple cells which provide different functions.  Red blood cells carry oxygen and are used for blood loss replacement.  White blood cells fight against infection.  Platelets control bleeding.  Plasma helps clot blood.  Other blood products are available for specialized needs, such as hemophilia or other clotting disorders. BEFORE THE TRANSFUSION  Who gives blood for transfusions?   Healthy volunteers who are fully evaluated to make sure their blood is safe. This is blood bank blood. Transfusion therapy is the safest it has ever been in the practice of medicine. Before blood is taken from a donor, a complete history is taken to make sure that person has no history of diseases nor engages in risky social behavior (examples are intravenous drug use or sexual activity with multiple partners). The donor's travel history is screened to minimize risk of transmitting infections, such as malaria. The donated blood is tested for signs of infectious diseases, such as HIV and hepatitis. The blood is then tested to be sure it is compatible with you in order to minimize the chance of a transfusion reaction. If you or a relative donates blood, this is often done in anticipation of surgery and is not appropriate for emergency situations. It takes many days to process the donated blood. RISKS AND COMPLICATIONS Although transfusion therapy is very safe and saves many lives, the main dangers of transfusion include:   Getting an infectious disease.  Developing a transfusion reaction. This is an allergic reaction to something in the blood you were given. Every precaution is taken to prevent this. The decision to have a blood transfusion has been considered carefully by your caregiver before blood is given. Blood is not given unless the benefits outweigh  the risks. AFTER THE TRANSFUSION  Right after receiving a blood transfusion, you will usually  feel much better and more energetic. This is especially true if your red blood cells have gotten low (anemic). The transfusion raises the level of the red blood cells which carry oxygen, and this usually causes an energy increase.  The nurse administering the transfusion will monitor you carefully for complications. HOME CARE INSTRUCTIONS  No special instructions are needed after a transfusion. You may find your energy is better. Speak with your caregiver about any limitations on activity for underlying diseases you may have. SEEK MEDICAL CARE IF:   Your condition is not improving after your transfusion.  You develop redness or irritation at the intravenous (IV) site. SEEK IMMEDIATE MEDICAL CARE IF:  Any of the following symptoms occur over the next 12 hours:  Shaking chills.  You have a temperature by mouth above 102 F (38.9 C), not controlled by medicine.  Chest, back, or muscle pain.  People around you feel you are not acting correctly or are confused.  Shortness of breath or difficulty breathing.  Dizziness and fainting.  You get a rash or develop hives.  You have a decrease in urine output.  Your urine turns a dark color or changes to pink, red, or brown. Any of the following symptoms occur over the next 10 days:  You have a temperature by mouth above 102 F (38.9 C), not controlled by medicine.  Shortness of breath.  Weakness after normal activity.  The white part of the eye turns yellow (jaundice).  You have a decrease in the amount of urine or are urinating less often.  Your urine turns a dark color or changes to pink, red, or brown. Document Released: 11/06/2000 Document Revised: 02/01/2012 Document Reviewed: 06/25/2008 ExitCare Patient Information 2014 [place].  _______________________________________________________________________  Incentive Spirometer  An incentive spirometer is a tool that can help keep your lungs clear and active. This tool  measures how well you are filling your lungs with each breath. Taking long deep breaths may help reverse or decrease the chance of developing breathing (pulmonary) problems (especially infection) following:  A long period of time when you are unable to move or be active. BEFORE THE PROCEDURE   If the spirometer includes an indicator to show your best effort, your nurse or respiratory therapist will set it to a desired goal.  If possible, sit up straight or lean slightly forward. Try not to slouch.  Hold the incentive spirometer in an upright position. INSTRUCTIONS FOR USE   Sit on the edge of your bed if possible, or sit up as far as you can in bed or on a chair.  Hold the incentive spirometer in an upright position.  Breathe out normally.  Place the mouthpiece in your mouth and seal your lips tightly around it.  Breathe in slowly and as deeply as possible, raising the piston or the ball toward the top of the column.  Hold your breath for 3-5 seconds or for as long as possible. Allow the piston or ball to fall to the bottom of the column.  Remove the mouthpiece from your mouth and breathe out normally.  Rest for a few seconds and repeat Steps 1 through 7 at least 10 times every 1-2 hours when you are awake. Take your time and take a few normal breaths between deep breaths.  The spirometer may include an indicator to show your best effort. Use the indicator as a goal to work  toward during each repetition.  After each set of 10 deep breaths, practice coughing to be sure your lungs are clear. If you have an incision (the cut made at the time of surgery), support your incision when coughing by placing a pillow or rolled up towels firmly against it. Once you are able to get out of bed, walk around indoors and cough well. You may stop using the incentive spirometer when instructed by your caregiver.  RISKS AND COMPLICATIONS  Take your time so you do not get dizzy or light-headed.  If  you are in pain, you may need to take or ask for pain medication before doing incentive spirometry. It is harder to take a deep breath if you are having pain. AFTER USE  Rest and breathe slowly and easily.  It can be helpful to keep track of a log of your progress. Your caregiver can provide you with a simple table to help with this. If you are using the spirometer at home, follow these instructions: [place] IF:   You are having difficultly using the spirometer.  You have trouble using the spirometer as often as instructed.  Your pain medication is not giving enough relief while using the spirometer.  You develop fever of 100.5 F (38.1 C) or higher. SEEK IMMEDIATE MEDICAL CARE IF:   You cough up bloody sputum that had not been present before.  You develop fever of 102 F (38.9 C) or greater.  You develop worsening pain at or near the incision site. MAKE SURE YOU:   Understand these instructions.  Will watch your condition.  Will get help right away if you are not doing well or get worse. Document Released: 03/22/2007 Document Revised: 02/01/2012 Document Reviewed: 05/23/2007 ExitCare Patient Information 2014 ExitCare, [place].   ________________________________________________________________________  WHAT IS A BLOOD TRANSFUSION? Blood Transfusion Information  A transfusion is the replacement of blood or some of its parts. Blood is made up of multiple cells which provide different functions.  Red blood cells carry oxygen and are used for blood loss replacement.  White blood cells fight against infection.  Platelets control bleeding.  Plasma helps clot blood.  Other blood products are available for specialized needs, such as hemophilia or other clotting disorders. BEFORE THE TRANSFUSION  Who gives blood for transfusions?   Healthy volunteers who are fully evaluated to make sure their blood is safe. This is blood bank blood. Transfusion therapy is the  safest it has ever been in the practice of medicine. Before blood is taken from a donor, a complete history is taken to make sure that person has no history of diseases nor engages in risky social behavior (examples are intravenous drug use or sexual activity with multiple partners). The donor's travel history is screened to minimize risk of transmitting infections, such as malaria. The donated blood is tested for signs of infectious diseases, such as HIV and hepatitis. The blood is then tested to be sure it is compatible with you in order to minimize the chance of a transfusion reaction. If you or a relative donates blood, this is often done in anticipation of surgery and is not appropriate for emergency situations. It takes many days to process the donated blood. RISKS AND COMPLICATIONS Although transfusion therapy is very safe and saves many lives, the main dangers of transfusion include:   Getting an infectious disease.  Developing a transfusion reaction. This is an allergic reaction to something in the blood you were given. Every precaution is taken  to prevent this. The decision to have a blood transfusion has been considered carefully by your caregiver before blood is given. Blood is not given unless the benefits outweigh the risks. AFTER THE TRANSFUSION  Right after receiving a blood transfusion, you will usually feel much better and more energetic. This is especially true if your red blood cells have gotten low (anemic). The transfusion raises the level of the red blood cells which carry oxygen, and this usually causes an energy increase.  The nurse administering the transfusion will monitor you carefully for complications. HOME CARE INSTRUCTIONS  No special instructions are needed after a transfusion. You may find your energy is better. Speak with your caregiver about any limitations on activity for underlying diseases you may have. SEEK MEDICAL CARE IF:   Your condition is not improving  after your transfusion.  You develop redness or irritation at the intravenous (IV) site. SEEK IMMEDIATE MEDICAL CARE IF:  Any of the following symptoms occur over the next 12 hours:  Shaking chills.  You have a temperature by mouth above 102 F (38.9 C), not controlled by medicine.  Chest, back, or muscle pain.  People around you feel you are not acting correctly or are confused.  Shortness of breath or difficulty breathing.  Dizziness and fainting.  You get a rash or develop hives.  You have a decrease in urine output.  Your urine turns a dark color or changes to pink, red, or brown. Any of the following symptoms occur over the next 10 days:  You have a temperature by mouth above 102 F (38.9 C), not controlled by medicine.  Shortness of breath.  Weakness after normal activity.  The white part of the eye turns yellow (jaundice).  You have a decrease in the amount of urine or are urinating less often.  Your urine turns a dark color or changes to pink, red, or brown. Document Released: 11/06/2000 Document Revised: 02/01/2012 Document Reviewed: 06/25/2008 [facility] Patient Information 2014 [place], [place].  _______________________________________________________________________

## 2015-07-13 ENCOUNTER — Other Ambulatory Visit: Payer: Self-pay | Admitting: Neurology

## 2015-07-15 ENCOUNTER — Ambulatory Visit (HOSPITAL_COMMUNITY)
Admission: RE | Admit: 2015-07-15 | Discharge: 2015-07-15 | Disposition: A | Discharge status: Home / Self Care | Payer: Medicare Other | Source: Ambulatory Visit | Attending: Surgical | Admitting: Surgical

## 2015-07-15 ENCOUNTER — Encounter (HOSPITAL_COMMUNITY)
Admission: RE | Admit: 2015-07-15 | Discharge: 2015-07-15 | Disposition: A | Discharge status: Home / Self Care | Payer: Medicare Other | Source: Ambulatory Visit | Attending: Orthopedic Surgery | Admitting: Orthopedic Surgery

## 2015-07-15 ENCOUNTER — Encounter (HOSPITAL_COMMUNITY): Payer: Self-pay

## 2015-07-15 DIAGNOSIS — Z951 Presence of aortocoronary bypass graft: Secondary | ICD-10-CM | POA: Diagnosis not present

## 2015-07-15 DIAGNOSIS — Z01812 Encounter for preprocedural laboratory examination: Secondary | ICD-10-CM | POA: Insufficient documentation

## 2015-07-15 DIAGNOSIS — M179 Osteoarthritis of knee, unspecified: Secondary | ICD-10-CM | POA: Diagnosis not present

## 2015-07-15 DIAGNOSIS — Z01818 Encounter for other preprocedural examination: Secondary | ICD-10-CM

## 2015-07-15 DIAGNOSIS — Z0183 Encounter for blood typing: Secondary | ICD-10-CM | POA: Insufficient documentation

## 2015-07-15 HISTORY — DX: Personal history of other diseases of the musculoskeletal system and connective tissue: Z87.39

## 2015-07-15 HISTORY — DX: Unspecified dementia, unspecified severity, without behavioral disturbance, psychotic disturbance, mood disturbance, and anxiety: F03.90

## 2015-07-15 LAB — SURGICAL PCR SCREEN
MRSA, PCR: NEGATIVE
STAPHYLOCOCCUS AUREUS: NEGATIVE

## 2015-07-15 LAB — PROTIME-INR
INR: 1.21 (ref 0.00–1.49)
Prothrombin Time: 15.5 seconds — ABNORMAL HIGH (ref 11.6–15.2)

## 2015-07-15 LAB — COMPREHENSIVE METABOLIC PANEL
ALT: 33 U/L (ref 14–54)
AST: 26 U/L (ref 15–41)
Albumin: 4.1 g/dL (ref 3.5–5.0)
Alkaline Phosphatase: 32 U/L — ABNORMAL LOW (ref 38–126)
Anion gap: 6 (ref 5–15)
BUN: 35 mg/dL — ABNORMAL HIGH (ref 6–20)
CO2: 26 mmol/L (ref 22–32)
Calcium: 9.9 mg/dL (ref 8.9–10.3)
Chloride: 105 mmol/L (ref 101–111)
Creatinine, Ser: 1.45 mg/dL — ABNORMAL HIGH (ref 0.44–1.00)
GFR calc Af Amer: 36 mL/min — ABNORMAL LOW (ref >60)
GFR calc non Af Amer: 31 mL/min — ABNORMAL LOW (ref >60)
Glucose, Bld: 115 mg/dL — ABNORMAL HIGH (ref 65–99)
Potassium: 4.9 mmol/L (ref 3.5–5.1)
Sodium: 137 mmol/L (ref 135–145)
Total Bilirubin: 0.4 mg/dL (ref 0.3–1.2)
Total Protein: 7 g/dL (ref 6.5–8.1)

## 2015-07-15 LAB — URINALYSIS, ROUTINE W REFLEX MICROSCOPIC
Bilirubin Urine: NEGATIVE
Glucose, UA: 500 mg/dL — AB
Hgb urine dipstick: NEGATIVE
Ketones, ur: NEGATIVE mg/dL
Leukocytes, UA: NEGATIVE
Nitrite: NEGATIVE
Protein, ur: NEGATIVE mg/dL
Specific Gravity, Urine: 1.007 (ref 1.005–1.030)
Urobilinogen, UA: 0.2 mg/dL (ref 0.0–1.0)
pH: 5.5 (ref 5.0–8.0)

## 2015-07-15 LAB — CBC WITH DIFFERENTIAL/PLATELET
Basophils Absolute: 0 10*3/uL (ref 0.0–0.1)
Basophils Relative: 0 % (ref 0–1)
Eosinophils Absolute: 0.2 10*3/uL (ref 0.0–0.7)
Eosinophils Relative: 2 % (ref 0–5)
HCT: 36.9 % (ref 36.0–46.0)
Hemoglobin: 12.1 g/dL (ref 12.0–15.0)
Lymphocytes Relative: 44 % (ref 12–46)
Lymphs Abs: 3.3 10*3/uL (ref 0.7–4.0)
MCH: 29.7 pg (ref 26.0–34.0)
MCHC: 32.8 g/dL (ref 30.0–36.0)
MCV: 90.7 fL (ref 78.0–100.0)
Monocytes Absolute: 0.6 10*3/uL (ref 0.1–1.0)
Monocytes Relative: 8 % (ref 3–12)
Neutro Abs: 3.4 10*3/uL (ref 1.7–7.7)
Neutrophils Relative %: 46 % (ref 43–77)
Platelets: 186 10*3/uL (ref 150–400)
RBC: 4.07 MIL/uL (ref 3.87–5.11)
RDW: 13.5 % (ref 11.5–15.5)
WBC: 7.5 10*3/uL (ref 4.0–10.5)

## 2015-07-15 LAB — APTT: aPTT: 30 seconds (ref 24–37)

## 2015-07-15 NOTE — Progress Notes (Signed)
cmet results faxed to dr Gladstone Lighter by epic

## 2015-07-15 NOTE — Progress Notes (Signed)
   07/15/15 1021  OBSTRUCTIVE SLEEP APNEA  Have you ever been diagnosed with sleep apnea through a sleep study? No  Do you snore loudly (loud enough to be heard through closed doors)?  1  Do you often feel tired, fatigued, or sleepy during the daytime? 1  Has anyone observed you stop breathing during your sleep? 0  Do you have, or are you being treated for high blood pressure? 1  BMI more than 35 kg/m2? 0  Age over 79 years old? 1  Neck circumference greater than 40 cm/16 inches? 0  Gender: 0  Obstructive Sleep Apnea Score 4

## 2015-07-17 ENCOUNTER — Encounter: Payer: Self-pay | Admitting: Internal Medicine

## 2015-07-17 DIAGNOSIS — Z9189 Other specified personal risk factors, not elsewhere classified: Secondary | ICD-10-CM | POA: Insufficient documentation

## 2015-07-19 ENCOUNTER — Encounter (HOSPITAL_COMMUNITY): Payer: Self-pay

## 2015-07-19 ENCOUNTER — Inpatient Hospital Stay (HOSPITAL_COMMUNITY)
Admission: RE | Admit: 2015-07-19 | Discharge: 2015-07-22 | DRG: 470 | Disposition: A | Discharge status: Skilled Nursing Facility | Payer: Medicare Other | Source: Ambulatory Visit | Attending: Orthopedic Surgery | Admitting: Orthopedic Surgery

## 2015-07-19 ENCOUNTER — Encounter (HOSPITAL_COMMUNITY)
Admission: RE | Disposition: A | Discharge status: Skilled Nursing Facility | Payer: Self-pay | Source: Ambulatory Visit | Attending: Orthopedic Surgery

## 2015-07-19 ENCOUNTER — Inpatient Hospital Stay (HOSPITAL_COMMUNITY): Payer: Medicare Other | Admitting: Anesthesiology

## 2015-07-19 DIAGNOSIS — I4891 Unspecified atrial fibrillation: Secondary | ICD-10-CM | POA: Diagnosis not present

## 2015-07-19 DIAGNOSIS — Z950 Presence of cardiac pacemaker: Secondary | ICD-10-CM

## 2015-07-19 DIAGNOSIS — M179 Osteoarthritis of knee, unspecified: Secondary | ICD-10-CM | POA: Diagnosis not present

## 2015-07-19 DIAGNOSIS — Z79899 Other long term (current) drug therapy: Secondary | ICD-10-CM | POA: Diagnosis not present

## 2015-07-19 DIAGNOSIS — N182 Chronic kidney disease, stage 2 (mild): Secondary | ICD-10-CM | POA: Diagnosis not present

## 2015-07-19 DIAGNOSIS — M1712 Unilateral primary osteoarthritis, left knee: Principal | ICD-10-CM | POA: Diagnosis present

## 2015-07-19 DIAGNOSIS — Z87891 Personal history of nicotine dependence: Secondary | ICD-10-CM

## 2015-07-19 DIAGNOSIS — E1049 Type 1 diabetes mellitus with other diabetic neurological complication: Secondary | ICD-10-CM | POA: Diagnosis present

## 2015-07-19 DIAGNOSIS — E039 Hypothyroidism, unspecified: Secondary | ICD-10-CM | POA: Diagnosis present

## 2015-07-19 DIAGNOSIS — Z90710 Acquired absence of both cervix and uterus: Secondary | ICD-10-CM | POA: Diagnosis not present

## 2015-07-19 DIAGNOSIS — K5710 Diverticulosis of small intestine without perforation or abscess without bleeding: Secondary | ICD-10-CM | POA: Diagnosis not present

## 2015-07-19 DIAGNOSIS — R2681 Unsteadiness on feet: Secondary | ICD-10-CM | POA: Diagnosis not present

## 2015-07-19 DIAGNOSIS — Z8249 Family history of ischemic heart disease and other diseases of the circulatory system: Secondary | ICD-10-CM | POA: Diagnosis not present

## 2015-07-19 DIAGNOSIS — I5022 Chronic systolic (congestive) heart failure: Secondary | ICD-10-CM | POA: Diagnosis not present

## 2015-07-19 DIAGNOSIS — E785 Hyperlipidemia, unspecified: Secondary | ICD-10-CM | POA: Diagnosis present

## 2015-07-19 DIAGNOSIS — Z471 Aftercare following joint replacement surgery: Secondary | ICD-10-CM | POA: Diagnosis not present

## 2015-07-19 DIAGNOSIS — M21162 Varus deformity, not elsewhere classified, left knee: Secondary | ICD-10-CM | POA: Diagnosis present

## 2015-07-19 DIAGNOSIS — I129 Hypertensive chronic kidney disease with stage 1 through stage 4 chronic kidney disease, or unspecified chronic kidney disease: Secondary | ICD-10-CM | POA: Diagnosis present

## 2015-07-19 DIAGNOSIS — E119 Type 2 diabetes mellitus without complications: Secondary | ICD-10-CM | POA: Diagnosis not present

## 2015-07-19 DIAGNOSIS — I10 Essential (primary) hypertension: Secondary | ICD-10-CM | POA: Diagnosis not present

## 2015-07-19 DIAGNOSIS — I259 Chronic ischemic heart disease, unspecified: Secondary | ICD-10-CM | POA: Diagnosis not present

## 2015-07-19 DIAGNOSIS — I2510 Atherosclerotic heart disease of native coronary artery without angina pectoris: Secondary | ICD-10-CM | POA: Diagnosis present

## 2015-07-19 DIAGNOSIS — Z96652 Presence of left artificial knee joint: Secondary | ICD-10-CM | POA: Diagnosis not present

## 2015-07-19 DIAGNOSIS — K219 Gastro-esophageal reflux disease without esophagitis: Secondary | ICD-10-CM | POA: Diagnosis present

## 2015-07-19 DIAGNOSIS — M6281 Muscle weakness (generalized): Secondary | ICD-10-CM | POA: Diagnosis not present

## 2015-07-19 DIAGNOSIS — Z96659 Presence of unspecified artificial knee joint: Secondary | ICD-10-CM

## 2015-07-19 DIAGNOSIS — Z8673 Personal history of transient ischemic attack (TIA), and cerebral infarction without residual deficits: Secondary | ICD-10-CM | POA: Diagnosis not present

## 2015-07-19 DIAGNOSIS — I480 Paroxysmal atrial fibrillation: Secondary | ICD-10-CM | POA: Diagnosis present

## 2015-07-19 DIAGNOSIS — R41841 Cognitive communication deficit: Secondary | ICD-10-CM | POA: Diagnosis not present

## 2015-07-19 DIAGNOSIS — F0390 Unspecified dementia without behavioral disturbance: Secondary | ICD-10-CM | POA: Diagnosis not present

## 2015-07-19 DIAGNOSIS — Z86010 Personal history of colonic polyps: Secondary | ICD-10-CM

## 2015-07-19 DIAGNOSIS — R278 Other lack of coordination: Secondary | ICD-10-CM | POA: Diagnosis not present

## 2015-07-19 DIAGNOSIS — Z794 Long term (current) use of insulin: Secondary | ICD-10-CM

## 2015-07-19 DIAGNOSIS — Z800 Family history of malignant neoplasm of digestive organs: Secondary | ICD-10-CM

## 2015-07-19 DIAGNOSIS — Z6834 Body mass index (BMI) 34.0-34.9, adult: Secondary | ICD-10-CM

## 2015-07-19 DIAGNOSIS — Z951 Presence of aortocoronary bypass graft: Secondary | ICD-10-CM

## 2015-07-19 DIAGNOSIS — M1990 Unspecified osteoarthritis, unspecified site: Secondary | ICD-10-CM | POA: Diagnosis not present

## 2015-07-19 DIAGNOSIS — M25562 Pain in left knee: Secondary | ICD-10-CM | POA: Diagnosis not present

## 2015-07-19 DIAGNOSIS — R1314 Dysphagia, pharyngoesophageal phase: Secondary | ICD-10-CM | POA: Diagnosis not present

## 2015-07-19 DIAGNOSIS — E1149 Type 2 diabetes mellitus with other diabetic neurological complication: Secondary | ICD-10-CM | POA: Diagnosis not present

## 2015-07-19 HISTORY — PX: TOTAL KNEE ARTHROPLASTY: SHX125

## 2015-07-19 LAB — GLUCOSE, CAPILLARY
Glucose-Capillary: 120 mg/dL — ABNORMAL HIGH (ref 65–99)
Glucose-Capillary: 173 mg/dL — ABNORMAL HIGH (ref 65–99)
Glucose-Capillary: 171 mg/dL — ABNORMAL HIGH (ref 65–99)
Glucose-Capillary: 173 mg/dL — ABNORMAL HIGH (ref 65–99)

## 2015-07-19 LAB — TYPE AND SCREEN
ABO/RH(D): A NEG
Antibody Screen: NEGATIVE

## 2015-07-19 SURGERY — ARTHROPLASTY, KNEE, TOTAL
Anesthesia: General | Site: Knee | Laterality: Left

## 2015-07-19 MED ORDER — PHENYLEPHRINE HCL 10 MG/ML IJ SOLN
INTRAMUSCULAR | Status: AC
  Filled 2015-07-19: qty 1

## 2015-07-19 MED ORDER — DILTIAZEM HCL 30 MG PO TABS
30.0000 mg | ORAL_TABLET | Freq: Four times a day (QID) | ORAL | Status: DC
  Administered 2015-07-19 – 2015-07-22 (×11): 30 mg via ORAL
  Filled 2015-07-19 (×16): qty 1

## 2015-07-19 MED ORDER — MEPERIDINE HCL 50 MG/ML IJ SOLN: 6.2500 mg | INTRAMUSCULAR | Status: DC | PRN

## 2015-07-19 MED ORDER — FENTANYL CITRATE (PF) 100 MCG/2ML IJ SOLN
INTRAMUSCULAR | Status: AC
  Filled 2015-07-19: qty 4

## 2015-07-19 MED ORDER — BUPIVACAINE HCL (PF) 0.25 % IJ SOLN
INTRAMUSCULAR | Status: DC | PRN
  Administered 2015-07-19: 20 mL

## 2015-07-19 MED ORDER — FENTANYL CITRATE (PF) 100 MCG/2ML IJ SOLN
25.0000 ug | INTRAMUSCULAR | Status: DC | PRN
  Administered 2015-07-19 (×2): 25 ug via INTRAVENOUS
  Administered 2015-07-19: 50 ug via INTRAVENOUS

## 2015-07-19 MED ORDER — DONEPEZIL HCL 10 MG PO TABS
10.0000 mg | ORAL_TABLET | Freq: Every day | ORAL | Status: DC
  Administered 2015-07-19 – 2015-07-21 (×3): 10 mg via ORAL
  Filled 2015-07-19 (×4): qty 1

## 2015-07-19 MED ORDER — ONDANSETRON HCL 4 MG/2ML IJ SOLN
INTRAMUSCULAR | Status: AC
  Filled 2015-07-19: qty 2

## 2015-07-19 MED ORDER — MENTHOL 3 MG MT LOZG: 1.0000 | LOZENGE | OROMUCOSAL | Status: DC | PRN

## 2015-07-19 MED ORDER — TRANEXAMIC ACID 1000 MG/10ML IV SOLN: 2000.0000 mg | Freq: Once | INTRAVENOUS | Status: DC

## 2015-07-19 MED ORDER — METOPROLOL SUCCINATE ER 100 MG PO TB24
200.0000 mg | ORAL_TABLET | Freq: Every day | ORAL | Status: DC
  Administered 2015-07-20 – 2015-07-22 (×3): 200 mg via ORAL
  Filled 2015-07-19 (×3): qty 2

## 2015-07-19 MED ORDER — ONDANSETRON HCL 4 MG/2ML IJ SOLN
INTRAMUSCULAR | Status: DC | PRN
  Administered 2015-07-19: 4 mg via INTRAVENOUS

## 2015-07-19 MED ORDER — FENTANYL CITRATE (PF) 100 MCG/2ML IJ SOLN
INTRAMUSCULAR | Status: DC | PRN
  Administered 2015-07-19 (×4): 25 ug via INTRAVENOUS

## 2015-07-19 MED ORDER — POLYETHYLENE GLYCOL 3350 17 G PO PACK: 17.0000 g | PACK | Freq: Every day | ORAL | Status: DC | PRN

## 2015-07-19 MED ORDER — BROMOCRIPTINE MESYLATE 2.5 MG PO TABS
1.2500 mg | ORAL_TABLET | Freq: Every day | ORAL | Status: DC
  Administered 2015-07-19 – 2015-07-21 (×3): 1.25 mg via ORAL
  Filled 2015-07-19 (×4): qty 1

## 2015-07-19 MED ORDER — CANAGLIFLOZIN 100 MG PO TABS
100.0000 mg | ORAL_TABLET | Freq: Every day | ORAL | Status: DC
  Administered 2015-07-19 – 2015-07-22 (×4): 100 mg via ORAL
  Filled 2015-07-19 (×4): qty 1

## 2015-07-19 MED ORDER — ACETAMINOPHEN 10 MG/ML IV SOLN
1000.0000 mg | Freq: Once | INTRAVENOUS | Status: AC
  Administered 2015-07-19: 1000 mg via INTRAVENOUS

## 2015-07-19 MED ORDER — LACTATED RINGERS IV SOLN
INTRAVENOUS | Status: DC
Start: 2015-07-19 — End: 2015-07-22
  Administered 2015-07-19 – 2015-07-21 (×3): via INTRAVENOUS

## 2015-07-19 MED ORDER — COLESEVELAM HCL 625 MG PO TABS
1875.0000 mg | ORAL_TABLET | Freq: Two times a day (BID) | ORAL | Status: DC
  Administered 2015-07-19 – 2015-07-22 (×6): 1875 mg via ORAL
  Filled 2015-07-19 (×8): qty 3

## 2015-07-19 MED ORDER — LACTATED RINGERS IV SOLN
INTRAVENOUS | Status: DC
  Administered 2015-07-19 (×2): via INTRAVENOUS

## 2015-07-19 MED ORDER — LACTATED RINGERS IV SOLN: INTRAVENOUS | Status: DC

## 2015-07-19 MED ORDER — THROMBIN 5000 UNITS EX SOLR
CUTANEOUS | Status: AC
  Filled 2015-07-19: qty 5000

## 2015-07-19 MED ORDER — HYDROMORPHONE HCL 1 MG/ML IJ SOLN
INTRAMUSCULAR | Status: AC
  Filled 2015-07-19: qty 1

## 2015-07-19 MED ORDER — APIXABAN 2.5 MG PO TABS
2.5000 mg | ORAL_TABLET | Freq: Two times a day (BID) | ORAL | Status: DC
  Administered 2015-07-20 – 2015-07-22 (×5): 2.5 mg via ORAL
  Filled 2015-07-19 (×6): qty 1

## 2015-07-19 MED ORDER — SUCCINYLCHOLINE CHLORIDE 20 MG/ML IJ SOLN
INTRAMUSCULAR | Status: DC | PRN
  Administered 2015-07-19: 100 mg via INTRAVENOUS

## 2015-07-19 MED ORDER — PANTOPRAZOLE SODIUM 40 MG PO TBEC
40.0000 mg | DELAYED_RELEASE_TABLET | Freq: Every day | ORAL | Status: DC
  Administered 2015-07-19 – 2015-07-21 (×3): 40 mg via ORAL
  Filled 2015-07-19 (×4): qty 1

## 2015-07-19 MED ORDER — REPAGLINIDE 2 MG PO TABS
2.0000 mg | ORAL_TABLET | Freq: Three times a day (TID) | ORAL | Status: DC
  Administered 2015-07-19 – 2015-07-22 (×8): 2 mg via ORAL
  Filled 2015-07-19 (×11): qty 1

## 2015-07-19 MED ORDER — SODIUM CHLORIDE 0.9 % IR SOLN
Status: DC | PRN
  Administered 2015-07-19: 1000 mL

## 2015-07-19 MED ORDER — SODIUM CHLORIDE 0.9 % IJ SOLN
INTRAMUSCULAR | Status: DC | PRN
  Administered 2015-07-19: 20 mL

## 2015-07-19 MED ORDER — CELECOXIB 200 MG PO CAPS
200.0000 mg | ORAL_CAPSULE | Freq: Two times a day (BID) | ORAL | Status: DC
Start: 2015-07-19 — End: 2015-07-22
  Administered 2015-07-19 – 2015-07-22 (×6): 200 mg via ORAL
  Filled 2015-07-19 (×7): qty 1

## 2015-07-19 MED ORDER — NITROGLYCERIN 0.3 MG SL SUBL: 0.3000 mg | SUBLINGUAL_TABLET | SUBLINGUAL | Status: DC | PRN

## 2015-07-19 MED ORDER — FENTANYL CITRATE (PF) 100 MCG/2ML IJ SOLN
INTRAMUSCULAR | Status: AC
  Filled 2015-07-19: qty 2

## 2015-07-19 MED ORDER — PHENOL 1.4 % MT LIQD: 1.0000 | OROMUCOSAL | Status: DC | PRN

## 2015-07-19 MED ORDER — ACARBOSE 50 MG PO TABS
50.0000 mg | ORAL_TABLET | Freq: Three times a day (TID) | ORAL | Status: DC
  Administered 2015-07-19 – 2015-07-22 (×8): 50 mg via ORAL
  Filled 2015-07-19 (×11): qty 1

## 2015-07-19 MED ORDER — PROPOFOL 10 MG/ML IV BOLUS
INTRAVENOUS | Status: DC | PRN
  Administered 2015-07-19: 100 mg via INTRAVENOUS

## 2015-07-19 MED ORDER — LIDOCAINE HCL (CARDIAC) 20 MG/ML IV SOLN
INTRAVENOUS | Status: DC | PRN
  Administered 2015-07-19: 100 mg via INTRAVENOUS

## 2015-07-19 MED ORDER — FLEET ENEMA 7-19 GM/118ML RE ENEM: 1.0000 | ENEMA | Freq: Once | RECTAL | Status: DC | PRN

## 2015-07-19 MED ORDER — BUPIVACAINE LIPOSOME 1.3 % IJ SUSP
20.0000 mL | Freq: Once | INTRAMUSCULAR | Status: AC
  Administered 2015-07-19: 20 mL
  Filled 2015-07-19: qty 20

## 2015-07-19 MED ORDER — SODIUM CHLORIDE 0.9 % IR SOLN
Status: AC
  Filled 2015-07-19: qty 1

## 2015-07-19 MED ORDER — LEVOTHYROXINE SODIUM 25 MCG PO TABS
25.0000 ug | ORAL_TABLET | Freq: Every day | ORAL | Status: DC
  Administered 2015-07-20 – 2015-07-22 (×3): 25 ug via ORAL
  Filled 2015-07-19 (×4): qty 1

## 2015-07-19 MED ORDER — TRANEXAMIC ACID 1000 MG/10ML IV SOLN
2000.0000 mg | INTRAVENOUS | Status: DC
  Filled 2015-07-19: qty 20

## 2015-07-19 MED ORDER — THROMBIN 5000 UNITS EX SOLR
OROMUCOSAL | Status: DC | PRN
  Administered 2015-07-19: 09:00:00 via TOPICAL

## 2015-07-19 MED ORDER — HYDROMORPHONE HCL 2 MG PO TABS
2.0000 mg | ORAL_TABLET | ORAL | Status: DC | PRN
  Administered 2015-07-19: 2 mg via ORAL
  Filled 2015-07-19: qty 1

## 2015-07-19 MED ORDER — ONDANSETRON HCL 4 MG PO TABS
4.0000 mg | ORAL_TABLET | Freq: Four times a day (QID) | ORAL | Status: DC | PRN
Start: 2015-07-19 — End: 2015-07-22

## 2015-07-19 MED ORDER — ACETAMINOPHEN 650 MG RE SUPP: 650.0000 mg | Freq: Four times a day (QID) | RECTAL | Status: DC | PRN

## 2015-07-19 MED ORDER — DEXAMETHASONE SODIUM PHOSPHATE 10 MG/ML IJ SOLN
INTRAMUSCULAR | Status: AC
  Filled 2015-07-19: qty 1

## 2015-07-19 MED ORDER — ONDANSETRON HCL 4 MG/2ML IJ SOLN: 4.0000 mg | Freq: Four times a day (QID) | INTRAMUSCULAR | Status: DC | PRN

## 2015-07-19 MED ORDER — LIDOCAINE HCL (CARDIAC) 20 MG/ML IV SOLN
INTRAVENOUS | Status: AC
  Filled 2015-07-19: qty 5

## 2015-07-19 MED ORDER — DEXAMETHASONE SODIUM PHOSPHATE 10 MG/ML IJ SOLN
INTRAMUSCULAR | Status: DC | PRN
  Administered 2015-07-19: 10 mg via INTRAVENOUS

## 2015-07-19 MED ORDER — RIVAROXABAN 10 MG PO TABS: 10.0000 mg | ORAL_TABLET | Freq: Every day | ORAL | Status: DC

## 2015-07-19 MED ORDER — ALUM & MAG HYDROXIDE-SIMETH 200-200-20 MG/5ML PO SUSP: 30.0000 mL | ORAL | Status: DC | PRN

## 2015-07-19 MED ORDER — HYDROMORPHONE HCL 1 MG/ML IJ SOLN: 1.0000 mg | INTRAMUSCULAR | Status: DC | PRN

## 2015-07-19 MED ORDER — HYDROMORPHONE HCL 1 MG/ML IJ SOLN
0.5000 mg | INTRAMUSCULAR | Status: DC | PRN
  Administered 2015-07-19 (×2): 0.5 mg via INTRAVENOUS

## 2015-07-19 MED ORDER — PROMETHAZINE HCL 25 MG/ML IJ SOLN: 6.2500 mg | INTRAMUSCULAR | Status: DC | PRN

## 2015-07-19 MED ORDER — ACETAMINOPHEN 10 MG/ML IV SOLN
INTRAVENOUS | Status: AC
  Filled 2015-07-19: qty 100

## 2015-07-19 MED ORDER — ROCURONIUM BROMIDE 100 MG/10ML IV SOLN
INTRAVENOUS | Status: AC
  Filled 2015-07-19: qty 1

## 2015-07-19 MED ORDER — CEFAZOLIN SODIUM-DEXTROSE 2-3 GM-% IV SOLR
INTRAVENOUS | Status: AC
  Filled 2015-07-19: qty 50

## 2015-07-19 MED ORDER — BISACODYL 5 MG PO TBEC: 5.0000 mg | DELAYED_RELEASE_TABLET | Freq: Every day | ORAL | Status: DC | PRN

## 2015-07-19 MED ORDER — CEFAZOLIN SODIUM-DEXTROSE 2-3 GM-% IV SOLR
2.0000 g | INTRAVENOUS | Status: AC
  Administered 2015-07-19: 2 g via INTRAVENOUS

## 2015-07-19 MED ORDER — ACETAMINOPHEN 325 MG PO TABS
650.0000 mg | ORAL_TABLET | Freq: Four times a day (QID) | ORAL | Status: DC | PRN
  Administered 2015-07-19 – 2015-07-20 (×2): 650 mg via ORAL
  Filled 2015-07-19 (×2): qty 2

## 2015-07-19 MED ORDER — PROPOFOL 10 MG/ML IV BOLUS
INTRAVENOUS | Status: AC
  Filled 2015-07-19: qty 20

## 2015-07-19 MED ORDER — CEFAZOLIN SODIUM 1-5 GM-% IV SOLN
1.0000 g | Freq: Four times a day (QID) | INTRAVENOUS | Status: AC
  Administered 2015-07-19 (×2): 1 g via INTRAVENOUS
  Filled 2015-07-19 (×2): qty 50

## 2015-07-19 MED ORDER — SODIUM CHLORIDE 0.9 % IJ SOLN
INTRAMUSCULAR | Status: AC
  Filled 2015-07-19: qty 50

## 2015-07-19 MED ORDER — FERROUS SULFATE 325 (65 FE) MG PO TABS
325.0000 mg | ORAL_TABLET | Freq: Three times a day (TID) | ORAL | Status: DC
  Administered 2015-07-19 – 2015-07-22 (×8): 325 mg via ORAL
  Filled 2015-07-19 (×11): qty 1

## 2015-07-19 MED ORDER — INSULIN ASPART 100 UNIT/ML ~~LOC~~ SOLN
0.0000 [IU] | Freq: Three times a day (TID) | SUBCUTANEOUS | Status: DC
Start: 2015-07-19 — End: 2015-07-22
  Administered 2015-07-19 – 2015-07-21 (×4): 3 [IU] via SUBCUTANEOUS
  Administered 2015-07-21: 2 [IU] via SUBCUTANEOUS
  Administered 2015-07-22: 3 [IU] via SUBCUTANEOUS

## 2015-07-19 MED ORDER — SODIUM CHLORIDE 0.9 % IR SOLN
Status: DC | PRN
  Administered 2015-07-19: 500 mL

## 2015-07-19 MED ORDER — BUPIVACAINE HCL (PF) 0.25 % IJ SOLN
INTRAMUSCULAR | Status: AC
  Filled 2015-07-19: qty 30

## 2015-07-19 SURGICAL SUPPLY — 74 items
BAG DECANTER FOR FLEXI CONT (MISCELLANEOUS) IMPLANT
BAG ZIPLOCK 12X15 (MISCELLANEOUS) IMPLANT
BANDAGE ELASTIC 4 VELCRO ST LF (GAUZE/BANDAGES/DRESSINGS) ×3 IMPLANT
BANDAGE ELASTIC 6 VELCRO ST LF (GAUZE/BANDAGES/DRESSINGS) ×3 IMPLANT
BANDAGE ESMARK 6X9 LF (GAUZE/BANDAGES/DRESSINGS) ×1 IMPLANT
BLADE SAG 18X100X1.27 (BLADE) ×3 IMPLANT
BLADE SAW SGTL 11.0X1.19X90.0M (BLADE) ×3 IMPLANT
BNDG ESMARK 6X9 LF (GAUZE/BANDAGES/DRESSINGS) ×3
BONE CEMENT GENTAMICIN (Cement) ×6 IMPLANT
CAP KNEE TOTAL 3 SIGMA ×3 IMPLANT
CEMENT BONE GENTAMICIN 40 (Cement) ×2 IMPLANT
CUFF TOURN SGL QUICK 34 (TOURNIQUET CUFF) ×2
CUFF TRNQT CYL 34X4X40X1 (TOURNIQUET CUFF) ×1 IMPLANT
DECANTER SPIKE VIAL GLASS SM (MISCELLANEOUS) ×3 IMPLANT
DRAPE EXTREMITY T 121X128X90 (DRAPE) ×3 IMPLANT
DRAPE INCISE IOBAN 66X45 STRL (DRAPES) IMPLANT
DRAPE POUCH INSTRU U-SHP 10X18 (DRAPES) ×3 IMPLANT
DRAPE SHEET LG 3/4 BI-LAMINATE (DRAPES) ×3 IMPLANT
DRAPE U-SHAPE 47X51 STRL (DRAPES) ×3 IMPLANT
DRSG AQUACEL AG ADV 3.5X10 (GAUZE/BANDAGES/DRESSINGS) ×3 IMPLANT
DRSG PAD ABDOMINAL 8X10 ST (GAUZE/BANDAGES/DRESSINGS) IMPLANT
DRSG TEGADERM 4X4.75 (GAUZE/BANDAGES/DRESSINGS) ×3 IMPLANT
DURAPREP 26ML APPLICATOR (WOUND CARE) ×3 IMPLANT
ELECT REM PT RETURN 9FT ADLT (ELECTROSURGICAL) ×3
ELECTRODE REM PT RTRN 9FT ADLT (ELECTROSURGICAL) ×1 IMPLANT
EVACUATOR 1/8 PVC DRAIN (DRAIN) ×3 IMPLANT
FACESHIELD WRAPAROUND (MASK) ×15 IMPLANT
GAUZE SPONGE 2X2 8PLY STRL LF (GAUZE/BANDAGES/DRESSINGS) ×1 IMPLANT
GLOVE BIOGEL PI IND STRL 6.5 (GLOVE) ×1 IMPLANT
GLOVE BIOGEL PI IND STRL 8 (GLOVE) ×1 IMPLANT
GLOVE BIOGEL PI INDICATOR 6.5 (GLOVE) ×2
GLOVE BIOGEL PI INDICATOR 8 (GLOVE) ×2
GLOVE ECLIPSE 8.0 STRL XLNG CF (GLOVE) ×6 IMPLANT
GLOVE SURG SS PI 6.5 STRL IVOR (GLOVE) ×3 IMPLANT
GOWN STRL REUS W/TWL LRG LVL3 (GOWN DISPOSABLE) ×3 IMPLANT
GOWN STRL REUS W/TWL XL LVL3 (GOWN DISPOSABLE) ×3 IMPLANT
HANDPIECE INTERPULSE COAX TIP (DISPOSABLE) ×2
IMMOBILIZER KNEE 20 (SOFTGOODS) ×3
IMMOBILIZER KNEE 20 THIGH 36 (SOFTGOODS) ×1 IMPLANT
KIT BASIN OR (CUSTOM PROCEDURE TRAY) ×3 IMPLANT
LIQUID BAND (GAUZE/BANDAGES/DRESSINGS) ×3 IMPLANT
MANIFOLD NEPTUNE II (INSTRUMENTS) ×3 IMPLANT
NDL SAFETY ECLIPSE 18X1.5 (NEEDLE) ×2 IMPLANT
NEEDLE HYPO 18GX1.5 SHARP (NEEDLE) ×4
NEEDLE HYPO 22GX1.5 SAFETY (NEEDLE) ×3 IMPLANT
NS IRRIG 1000ML POUR BTL (IV SOLUTION) IMPLANT
PACK TOTAL JOINT (CUSTOM PROCEDURE TRAY) ×3 IMPLANT
PADDING CAST COTTON 6X4 STRL (CAST SUPPLIES) IMPLANT
PEN SKIN MARKING BROAD (MISCELLANEOUS) ×3 IMPLANT
POSITIONER SURGICAL ARM (MISCELLANEOUS) ×3 IMPLANT
SET HNDPC FAN SPRY TIP SCT (DISPOSABLE) ×1 IMPLANT
SET PAD KNEE POSITIONER (MISCELLANEOUS) ×3 IMPLANT
SPONGE GAUZE 2X2 STER 10/PKG (GAUZE/BANDAGES/DRESSINGS) ×2
SPONGE LAP 18X18 X RAY DECT (DISPOSABLE) IMPLANT
SPONGE SURGIFOAM ABS GEL 100 (HEMOSTASIS) ×3 IMPLANT
STAPLER VISISTAT 35W (STAPLE) IMPLANT
SUCTION FRAZIER 12FR DISP (SUCTIONS) ×3 IMPLANT
SUT BONE WAX W31G (SUTURE) ×3 IMPLANT
SUT MNCRL AB 4-0 PS2 18 (SUTURE) ×3 IMPLANT
SUT VIC AB 1 CT1 27 (SUTURE) ×4
SUT VIC AB 1 CT1 27XBRD ANTBC (SUTURE) ×2 IMPLANT
SUT VIC AB 2-0 CT1 27 (SUTURE) ×6
SUT VIC AB 2-0 CT1 TAPERPNT 27 (SUTURE) ×3 IMPLANT
SUT VLOC 180 0 24IN GS25 (SUTURE) ×3 IMPLANT
SYR 20CC LL (SYRINGE) ×6 IMPLANT
SYR 50ML LL SCALE MARK (SYRINGE) ×3 IMPLANT
TOWEL OR 17X26 10 PK STRL BLUE (TOWEL DISPOSABLE) ×3 IMPLANT
TOWEL OR NON WOVEN STRL DISP B (DISPOSABLE) IMPLANT
TOWER CARTRIDGE SMART MIX (DISPOSABLE) ×3 IMPLANT
TRAY FOLEY W/METER SILVER 14FR (SET/KITS/TRAYS/PACK) ×3 IMPLANT
TRAY FOLEY W/METER SILVER 16FR (SET/KITS/TRAYS/PACK) ×3 IMPLANT
WATER STERILE IRR 1500ML POUR (IV SOLUTION) ×3 IMPLANT
WRAP KNEE MAXI GEL POST OP (GAUZE/BANDAGES/DRESSINGS) ×3 IMPLANT
YANKAUER SUCT BULB TIP 10FT TU (MISCELLANEOUS) ×3 IMPLANT

## 2015-07-19 NOTE — Progress Notes (Signed)
Utilization review completed.  

## 2015-07-19 NOTE — Anesthesia Preprocedure Evaluation (Addendum)
Anesthesia Evaluation  Patient identified by MRN, date of birth, ID band Patient awake    Reviewed: Allergy & Precautions, NPO status , Patient's Chart, lab work & pertinent test results, reviewed documented beta blocker date and time   History of Anesthesia Complications Negative for: history of anesthetic complications  Airway Mallampati: II  TM Distance: >3 FB Neck ROM: Full    Dental  (+) Missing   Pulmonary former smoker,  breath sounds clear to auscultation        Cardiovascular hypertension, Pt. on medications and Pt. on home beta blockers + CAD and + CABG + dysrhythmias Atrial Fibrillation + pacemaker Rhythm:Regular Rate:Normal     Neuro/Psych CVA    GI/Hepatic GERD-  Medicated,  Endo/Other  diabetes, Type 1, Insulin DependentHypothyroidism   Renal/GU Renal disease  negative genitourinary   Musculoskeletal  (+) Arthritis -,   Abdominal (+) + obese,   Peds negative pediatric ROS (+)  Hematology   Anesthesia Other Findings   Reproductive/Obstetrics negative OB ROS                            Lab Results  Component Value Date   WBC 7.5 07/15/2015   HGB 12.1 07/15/2015   HCT 36.9 07/15/2015   MCV 90.7 07/15/2015   PLT 186 07/15/2015   Lab Results  Component Value Date   CREATININE 1.45* 07/15/2015   BUN 35* 07/15/2015   NA 137 07/15/2015   K 4.9 07/15/2015   CL 105 07/15/2015   CO2 26 07/15/2015   Lab Results  Component Value Date   INR 1.21 07/15/2015   INR 1.0 07/02/2008   EKG: atrial paced.  Echo (05/2015): Study Conclusions  - Left ventricle: The cavity size was normal. Wall thickness was increased in a pattern of mild LVH. Systolic function was normal. The estimated ejection fraction was in the range of 60% to 65%. - Aortic valve: AV is thckened, calcified with restricted motion Peak and mean gradients through the valve are 20 and 13 mm Hg respectively  consistent with mild AS. There was trivial regurgitation. - Mitral valve: There was mild regurgitation. - Left atrium: The atrium was moderately dilated.    Anesthesia Physical Anesthesia Plan  ASA: III  Anesthesia Plan: General   Post-op Pain Management:    Induction: Intravenous  Airway Management Planned: Oral ETT  Additional Equipment:   Intra-op Plan:   Post-operative Plan: Extubation in OR  Informed Consent: I have reviewed the patients History and Physical, chart, labs and discussed the procedure including the risks, benefits and alternatives for the proposed anesthesia with the patient or authorized representative who has indicated his/her understanding and acceptance.   Dental advisory given  Plan Discussed with: CRNA  Anesthesia Plan Comments:         Anesthesia Quick Evaluation

## 2015-07-19 NOTE — Brief Op Note (Signed)
07/19/2015  9:15 AM  PATIENT:  Morgin L Lakins  81 y.o. female  PRE-OPERATIVE DIAGNOSIS:Primary  OA OF LEFT KNEE and Morbid Obesity  POST-OPERATIVE DIAGNOSIS:Primary  OA OF LEFT KNEE and Morbid Obesity.  PROCEDURE:  Procedure(s): LEFT TOTAL KNEE ARTHROPLASTY (Left)  SURGEON:  Surgeon(s) and Role:    * Latanya Maudlin, MD - Primary  PHYSICIAN ASSISTANT: Ardeen Jourdain PA  ASSISTANTS: Ardeen Jourdain PA  ANESTHESIA:   general  EBL:  Total I/O In: -  Out: 600 [Urine:600]  BLOOD ADMINISTERED:none  DRAINS: (One) Hemovact drain(s) in the Left Knee with  Suction Open   LOCAL MEDICATIONS USED:  MARCAINE 20cc of 0.50$ Plain and 20cc of Exparel mixed with 20cc of Normal Saline.    SPECIMEN:  No Specimen  DISPOSITION OF SPECIMEN:  N/A  COUNTS:  YES  TOURNIQUET:    DICTATION: .Other Dictation: Dictation Number [id number]  PLAN OF CARE: Admit to inpatient   PATIENT DISPOSITION:  Stable in OR   Delay start of Pharmacological VTE agent (>24hrs) due to surgical blood loss or risk of bleeding: yes

## 2015-07-19 NOTE — Anesthesia Postprocedure Evaluation (Signed)
  Anesthesia Post-op Note  Patient: [name]  Procedure(s) Performed: Procedure(s): LEFT TOTAL KNEE ARTHROPLASTY (Left)  Patient Location: PACU  Anesthesia Type:General  Level of Consciousness: awake and alert   Airway and Oxygen Therapy: Patient Spontanous Breathing  Post-op Pain: mild  Post-op Assessment: Post-op Vital signs reviewed and Patient's Cardiovascular Status Stable LLE Motor Response: Purposeful movement LLE Sensation: Full sensation          Post-op Vital Signs: Reviewed and stable  Last Vitals:  Filed Vitals:   07/19/15 1130  BP: 193/63  Pulse: 61  Temp:   Resp: 14    Complications: No apparent anesthesia complications

## 2015-07-19 NOTE — Anesthesia Procedure Notes (Signed)
Procedure Name: Intubation Date/Time: 07/19/2015 7:23 AM Performed by: Lind Covert Pre-anesthesia Checklist: Patient identified, Timeout performed, Emergency Drugs available, Suction available and Patient being monitored Patient Re-evaluated:Patient Re-evaluated prior to inductionOxygen Delivery Method: Circle system utilized Preoxygenation: Pre-oxygenation with 100% oxygen Intubation Type: IV induction Laryngoscope Size: Mac and 4 Grade View: Grade I Tube type: Oral Tube size: 7.0 mm Number of attempts: 1 Airway Equipment and Method: Stylet Placement Confirmation: ETT inserted through vocal cords under direct vision,  breath sounds checked- equal and bilateral and positive ETCO2 Secured at: 22 cm Tube secured with: Tape Dental Injury: Teeth and Oropharynx as per pre-operative assessment

## 2015-07-19 NOTE — Interval H&P Note (Signed)
History and Physical Interval Note:  07/19/2015 7:13 AM  [name]  has presented today for surgery, with the diagnosis of OA OF LEFT KNEE  The various methods of treatment have been discussed with the patient and family. After consideration of risks, benefits and other options for treatment, the patient has consented to  Procedure(s): LEFT TOTAL KNEE ARTHROPLASTY (Left) as a surgical intervention .  The patient's history has been reviewed, patient examined, no change in status, stable for surgery.  I have reviewed the patient's chart and labs.  Questions were answered to the patient's satisfaction.     Raef Sprigg A

## 2015-07-19 NOTE — Transfer of Care (Signed)
Immediate Anesthesia Transfer of Care Note  Patient: [name]  Procedure(s) Performed: Procedure(s): LEFT TOTAL KNEE ARTHROPLASTY (Left)  Patient Location: PACU  Anesthesia Type:General  Level of Consciousness: sedated  Airway & Oxygen Therapy: Patient Spontanous Breathing and Patient connected to face mask oxygen  Post-op Assessment: Report given to RN and Post -op Vital signs reviewed and stable  Post vital signs: Reviewed and stable  Last Vitals:  Filed Vitals:   07/19/15 0529  BP: 143/77  Pulse: 79  Temp: 36.7 C  Resp: 16    Complications: No apparent anesthesia complications

## 2015-07-20 LAB — BASIC METABOLIC PANEL
ANION GAP: 8 (ref 5–15)
BUN: 29 mg/dL — ABNORMAL HIGH (ref 6–20)
CHLORIDE: 100 mmol/L — AB (ref 101–111)
CO2: 27 mmol/L (ref 22–32)
Calcium: 9.3 mg/dL (ref 8.9–10.3)
Creatinine, Ser: 1.34 mg/dL — ABNORMAL HIGH (ref 0.44–1.00)
GFR calc non Af Amer: 35 mL/min — ABNORMAL LOW (ref >60)
GFR, EST AFRICAN AMERICAN: 40 mL/min — AB (ref >60)
Glucose, Bld: 159 mg/dL — ABNORMAL HIGH (ref 65–99)
Potassium: 4.6 mmol/L (ref 3.5–5.1)
Sodium: 135 mmol/L (ref 135–145)

## 2015-07-20 LAB — GLUCOSE, CAPILLARY
GLUCOSE-CAPILLARY: 176 mg/dL — AB (ref 65–99)
GLUCOSE-CAPILLARY: 152 mg/dL — AB (ref 65–99)
GLUCOSE-CAPILLARY: 181 mg/dL — AB (ref 65–99)
Glucose-Capillary: 151 mg/dL — ABNORMAL HIGH (ref 65–99)

## 2015-07-20 LAB — CBC
HEMATOCRIT: 34.3 % — AB (ref 36.0–46.0)
HEMOGLOBIN: 11.5 g/dL — AB (ref 12.0–15.0)
MCH: 29.7 pg (ref 26.0–34.0)
MCHC: 33.5 g/dL (ref 30.0–36.0)
MCV: 88.6 fL (ref 78.0–100.0)
Platelets: 165 10*3/uL (ref 150–400)
RBC: 3.87 MIL/uL (ref 3.87–5.11)
RDW: 13.2 % (ref 11.5–15.5)
WBC: 9.7 10*3/uL (ref 4.0–10.5)

## 2015-07-20 MED ORDER — HYDROMORPHONE HCL 2 MG PO TABS: 2.0000 mg | ORAL_TABLET | ORAL | Status: DC | PRN

## 2015-07-20 MED ORDER — TRAMADOL HCL 50 MG PO TABS: 50.0000 mg | ORAL_TABLET | Freq: Four times a day (QID) | ORAL | Status: DC | PRN

## 2015-07-20 MED ORDER — TRAMADOL HCL 50 MG PO TABS
50.0000 mg | ORAL_TABLET | Freq: Four times a day (QID) | ORAL | Status: DC
  Administered 2015-07-20 – 2015-07-22 (×8): 50 mg via ORAL
  Filled 2015-07-20 (×8): qty 1

## 2015-07-20 NOTE — Progress Notes (Signed)
OT Cancellation Note  Patient Details Name: [name] MRN: [id number] DOB: [birth year]   Cancelled Treatment:    Reason Eval/Treat Not Completed: Other (comment) Pt is Medicare  and current D/C plan is SNF. No apparent immediate acute care OT needs, therefore will defer OT to SNF. If OT eval is needed please call Acute Rehab Dept. at [phone] or text page OT at [phone].  [place], OTR/L  [phone] 07/20/2015 07/20/2015, 12:18 PM

## 2015-07-20 NOTE — Progress Notes (Signed)
Physical Therapy Treatment Patient Details Name: [name] MRN: [id number] DOB: 12-Jan-1925 Today's Date: 07/20/2015    History of Present Illness L TKR; hx dementia, CVA, Cabg, R TKR, R RCR, DM    PT Comments    Pt very cooperative and progressing with mobility but continues impulsive and highly distractible.  Follow Up Recommendations  SNF     Equipment Recommendations  None recommended by PT    Recommendations for Other Services OT consult     Precautions / Restrictions Precautions Precautions: Fall Required Braces or Orthoses: Knee Immobilizer - Left Knee Immobilizer - Left: Discontinue once straight leg raise with < 10 degree lag Restrictions Weight Bearing Restrictions: No Other Position/Activity Restrictions: WBAT    Mobility  Bed Mobility Overal bed mobility: Needs Assistance;+ 2 for safety/equipment;+2 for physical assistance Bed Mobility: Sit to Supine       Sit to supine: Min assist;+2 for safety/equipment   General bed mobility comments: cues for sequence and use of R LE to self assist.   Transfers Overall transfer level: Needs assistance Equipment used: Rolling walker (2 wheeled) Transfers: Sit to/from Stand Sit to Stand: Min assist;+2 physical assistance;+2 safety/equipment;From elevated surface         General transfer comment: cues for LE management and use of UEs to self assist  Ambulation/Gait Ambulation/Gait assistance: Min assist;+2 safety/equipment Ambulation Distance (Feet): 16 Feet Assistive device: Rolling walker (2 wheeled) Gait Pattern/deviations: Step-to pattern;Shuffle;Trunk flexed Gait velocity: decr   General Gait Details: cues for sequence, posture, position from RW and increased UE WB   Stairs            Wheelchair Mobility    Modified Rankin (Stroke Patients Only)       Balance                                    Cognition Arousal/Alertness: Awake/alert Behavior During Therapy: WFL for  tasks assessed/performed Overall Cognitive Status: History of cognitive impairments - at baseline                      Exercises      General Comments        Pertinent Vitals/Pain Pain Assessment: Faces Faces Pain Scale: Hurts little more Pain Location: L knee Pain Descriptors / Indicators: Aching;Sore Pain Intervention(s): Limited activity within patient's tolerance;Monitored during session;Premedicated before session;Ice applied    Home Living                      Prior Function            PT Goals (current goals can now be found in the care plan section) Acute Rehab PT Goals PT Goal Formulation: With patient Time For Goal Achievement: 07/26/15 Potential to Achieve Goals: Good Progress towards PT goals: Progressing toward goals    Frequency  7X/week    PT Plan Current plan remains appropriate    Co-evaluation             End of Session Equipment Utilized During Treatment: Gait belt;Left knee immobilizer Activity Tolerance: Patient tolerated treatment well Patient left: in bed;with call bell/phone within reach     Time: 1550-1605 PT Time Calculation (min) (ACUTE ONLY): 15 min  Charges:  $Gait Training: 8-22 mins                    G Codes:  Stephanye Finnicum 07/20/2015, 4:47 PM

## 2015-07-20 NOTE — Progress Notes (Signed)
Physical Therapy Evaluation Patient Details Name: [name] MRN: [id number] DOB: 1925/02/12 Today's Date: 07/20/2015   History of Present Illness  L TKR; hx dementia, CVA, Cabg, R TKR, R RCR, DM  Clinical Impression  Pt s/p L TKR presents with decreased L LE strength/ROM, post op pain and increased confusion post op limiting functional mobility.  Pt would greatly benefit from follow up rehab at SNF level to maximize IND and safety prior to return home with ltd assist.    Follow Up Recommendations SNF    Equipment Recommendations  None recommended by PT    Recommendations for Other Services OT consult     Precautions / Restrictions Precautions Precautions: Fall Required Braces or Orthoses: Knee Immobilizer - Left Knee Immobilizer - Left: Discontinue once straight leg raise with < 10 degree lag Restrictions Weight Bearing Restrictions: No Other Position/Activity Restrictions: WBAT      Mobility  Bed Mobility Overal bed mobility: Needs Assistance;+ 2 for safety/equipment;+2 for physical assistance Bed Mobility: Supine to Sit     Supine to sit: Min assist;+2 for physical assistance;+2 for safety/equipment     General bed mobility comments: cues for sequence and use of R LE to self assist.  Physical assist to manage L LE and bring trunk to upright  Transfers Overall transfer level: Needs assistance Equipment used: Rolling walker (2 wheeled) Transfers: Sit to/from Stand Sit to Stand: Min assist;+2 physical assistance;+2 safety/equipment;From elevated surface         General transfer comment: cues for LE management and use of UEs to self assist  Ambulation/Gait Ambulation/Gait assistance: Min assist;+2 physical assistance;+2 safety/equipment Ambulation Distance (Feet): 12 Feet Assistive device: Rolling walker (2 wheeled) Gait Pattern/deviations: Step-to pattern;Decreased step length - right;Decreased step length - left;Shuffle;Trunk flexed Gait velocity: decr    General Gait Details: cues for sequence, posture, position from RW and increased UE WB  Stairs            Wheelchair Mobility    Modified Rankin (Stroke Patients Only)       Balance                                             Pertinent Vitals/Pain Pain Assessment: Faces Faces Pain Scale: Hurts little more Pain Location: L knee Pain Descriptors / Indicators: Heaviness;Sore Pain Intervention(s): Limited activity within patient's tolerance;Monitored during session;Premedicated before session;Ice applied    Home Living Family/patient expects to be discharged to:: Skilled nursing facility Living Arrangements: Alone                    Prior Function Level of Independence: Independent;Independent with assistive device(s)               Hand Dominance        Extremity/Trunk Assessment   Upper Extremity Assessment: Overall WFL for tasks assessed           Lower Extremity Assessment: LLE deficits/detail   LLE Deficits / Details: 2/5 quads with AAROM at knee -10-  35     Communication   Communication: No difficulties  Cognition Arousal/Alertness: Awake/alert Behavior During Therapy: WFL for tasks assessed/performed Overall Cognitive Status: History of cognitive impairments - at baseline (Dtr reports hx increased confusion post surgery)                      General Comments  Exercises Total Joint Exercises Ankle Circles/Pumps: AROM;Both;15 reps;Supine Quad Sets: AROM;AAROM;Both;10 reps;Supine Heel Slides: AAROM;Left;10 reps;Supine Straight Leg Raises: AAROM;Left;10 reps;Supine      Assessment/Plan    PT Assessment Patient needs continued PT services  PT Diagnosis Difficulty walking   PT Problem List Decreased strength;Decreased range of motion;Decreased activity tolerance;Decreased mobility;Decreased knowledge of use of DME;Obesity;Pain;Decreased cognition  PT Treatment Interventions DME instruction;Gait  training;Functional mobility training;Therapeutic activities;Therapeutic exercise;Patient/family education   PT Goals (Current goals can be found in the Care Plan section) Acute Rehab PT Goals Patient Stated Goal: I dont want this to hurt PT Goal Formulation: With patient Time For Goal Achievement: 07/26/15 Potential to Achieve Goals: Good    Frequency 7X/week   Barriers to discharge        Co-evaluation               End of Session Equipment Utilized During Treatment: Gait belt;Left knee immobilizer Activity Tolerance: Patient tolerated treatment well;Patient limited by fatigue Patient left: in chair;with call bell/phone within reach;with family/visitor present Nurse Communication: Mobility status         Time: 3612-2449 PT Time Calculation (min) (ACUTE ONLY): 38 min   Charges:   PT Evaluation $Initial PT Evaluation Tier I: 1 Procedure PT Treatments $Gait Training: 8-22 mins $Therapeutic Exercise: 8-22 mins   PT G Codes:        Ariyah Sedlack 08/07/15, 12:08 PM

## 2015-07-20 NOTE — Discharge Instructions (Signed)

## 2015-07-20 NOTE — Care Management Note (Signed)
Case Management Note  Patient Details  Name: [name] MRN: [id number] Date of Birth: October 04, 1925  Subjective/Objective:                  Left Total Knee Replacement  Action/Plan: SNF  Expected Discharge Date:                  Expected Discharge Plan:  Skilled Nursing Facility  In-House Referral:  Clinical Social Work  Discharge planning Services  CM Consult  Post Acute Care Choice:  NA Choice offered to:  NA  DME Arranged:  N/A DME Agency:  NA  HH Arranged:  NA HH Agency:     Status of Service:  Completed, signed off  Medicare Important Message Given:    Date Medicare IM Given:    Medicare IM give by:    Date Additional Medicare IM Given:    Additional Medicare Important Message give by:     If discussed at [place] of Stay Meetings, dates discussed:    Additional Comments: CM spoke with patient and her daughter at the bedside. Patient's daughter states she will be going to [facility] when discharged. CSW notified.   Apolonio Schneiders, RN 07/20/2015, 11:56 AM

## 2015-07-20 NOTE — Op Note (Signed)
[name], [name]                  ACCOUNT NO.:  [id number]  MEDICAL RECORD NO.:  [id number]  LOCATION:  6606                         FACILITY:  [facility]  PHYSICIAN:  Kipp Brood. Dantonio Justen, M.D.DATE OF BIRTH:  05-14-1925  DATE OF PROCEDURE:  07/19/2015 DATE OF DISCHARGE:                              OPERATIVE REPORT   SURGEON:  Kipp Brood. Gladstone Lighter, M.D.  ASSISTANT:  Ardeen Jourdain, [place]  PREOPERATIVE DIAGNOSIS: 1. Morbid obesity. 2. Severe osteoarthritis primary type with bone on bone and a genu     varus, left knee.  POSTOPERATIVE DIAGNOSIS: 1. Morbid obesity. 2. Severe osteoarthritis primary type with bone on bone and a genu     varus, left knee.  OPERATION:  Left total knee arthroplasty utilizing DePuy system, all 3 components were cemented.  Gentamicin was used in the cement.  The sizes used; a size 3 left posterior cruciate sacrificing femoral component, tibial tray size 3, the insert was a size 3, 10-mm thickness rotating platform polyethylene.  The patella was a size 35 with 3 pegs.  DESCRIPTION OF PROCEDURE:  Under general anesthesia, routine orthopedic prep and draping of left lower extremity was carried out, the appropriate time-out was carried out.  I also marked the appropriate left leg in the holding area.  At this time, the leg was exsanguinated with an Esmarch.  Tourniquet was elevated to 350 mmHg.  The left leg was placed in a DeMayo knee holder and flexed.  An anterior approach of the knee was carried out.  The patient had 2 g of IV Ancef and also had tranexamic acid.  Two flaps were created.  I then carried out a median parapatellar approach, reflected patella laterally and I did a lateral medial meniscectomies and excised the anterior-posterior cruciate ligaments.  Next, a drill hole was made in the intercondylar notch of the femur.  A guide rod was inserted.  We had a good canal opening and then thoroughly irrigated out the canal after we removed the guide  rod. Following that, we then removed 12-mm thickness off the distal femur. She was extremely obese, hence she was contracted.  I then measured the femur to be a size 3.  We did our anterior-posterior chamfering cuts for a size 3 right femoral component.  Next, attention was directed to the tibia.  We prepared the tibia in the usual fashion for a size 3 tray. Initial drill hole was made in the tibial plateau, and a guide rod was inserted down the tibia and we then removed that and irrigated out tibial canal as well.  I then measured the cup for the tibia to be approximately 4-mm thickness off from the medial side.  After this, we then inserted our lamina spreaders, removed the posterior spurs of the femur.  We then inserted our spacer blocks and felt with the 10 mm thickness was more than adequate.  At this point, we then thoroughly irrigated out the knee and then continued to do our keel cuts of the tibia, and we did our notch cut out distal femur.  We inserted our trial components, reduced the knee and had excellent function in extension and flexion and lateral  and medial stability with a 10-mm thickness insert. I then did a resurfacing procedure on the patella for a size 35 patella. Three drill holes were made in the patella.  At that time, all trial components were removed.  I thoroughly water picked out the knee and cemented all 3 components in simultaneously.  After the cement was hardened, all loose pieces of cement were removed.  Then water picked out the knee, and then inserted my permanent polyethylene rotating platform size 3, 10-mm thickness component and reduced the knee and had excellent function.  Hemovac drain was inserted.  We injected our 20 mL of 0.5% Marcaine plain and also later the 20 mL mixture with 20 mL of normal saline of Exparel.  The wound was closed in layers in usual fashion over Hemovac drain.          ______________________________ Kipp Brood. Gladstone Lighter,  M.D.     RAG/MEDQ  D:  07/19/2015  T:  07/20/2015  Job:  [id number]

## 2015-07-20 NOTE — Progress Notes (Signed)
Subjective: 1 Day Post-Op Procedure(s) (LRB): LEFT TOTAL KNEE ARTHROPLASTY (Left) Patient reports pain as 2 on 0-10 scale. Doing well. Hemovac DCd. She is somewhat confused this morning. Will hold Narcotics and use Tramadol.SNF Sunday if Stable.   Objective: Vital signs in last 24 hours: Temp:  [97.3 F (36.3 C)-98.3 F (36.8 C)] 97.5 F (36.4 C) (08/27 0553) Pulse Rate:  [59-81] 81 (08/27 0553) Resp:  [13-30] 16 (08/27 0553) BP: (135-193)/(59-80) 135/80 mmHg (08/27 0553) SpO2:  [95 %-100 %] 95 % (08/27 0553)  Intake/Output from previous day: 08/26 0701 - 08/27 0700 In: 1530 [P.O.:30; I.V.:1450; IV Piggyback:50] Out: 2870 [Urine:2700; Drains:170] Intake/Output this shift:     Recent Labs  07/20/15 0440  HGB 11.5*    Recent Labs  07/20/15 0440  WBC 9.7  RBC 3.87  HCT 34.3*  PLT 165    Recent Labs  07/20/15 0440  NA 135  K 4.6  CL 100*  CO2 27  BUN 29*  CREATININE 1.34*  GLUCOSE 159*  CALCIUM 9.3   No results for input(s): LABPT, INR in the last 72 hours.  Dorsiflexion/Plantar flexion intact No cellulitis present Compartment soft  Assessment/Plan: 1 Day Post-Op Procedure(s) (LRB): LEFT TOTAL KNEE ARTHROPLASTY (Left) Up with therapy  Myleigh Amara A 07/20/2015, 7:23 AM

## 2015-07-21 LAB — BASIC METABOLIC PANEL
ANION GAP: 5 (ref 5–15)
BUN: 37 mg/dL — ABNORMAL HIGH (ref 6–20)
CHLORIDE: 105 mmol/L (ref 101–111)
CO2: 24 mmol/L (ref 22–32)
Calcium: 8.9 mg/dL (ref 8.9–10.3)
Creatinine, Ser: 1.42 mg/dL — ABNORMAL HIGH (ref 0.44–1.00)
GFR calc non Af Amer: 32 mL/min — ABNORMAL LOW (ref >60)
GFR, EST AFRICAN AMERICAN: 37 mL/min — AB (ref >60)
Glucose, Bld: 143 mg/dL — ABNORMAL HIGH (ref 65–99)
POTASSIUM: 4.4 mmol/L (ref 3.5–5.1)
Sodium: 134 mmol/L — ABNORMAL LOW (ref 135–145)

## 2015-07-21 LAB — GLUCOSE, CAPILLARY
GLUCOSE-CAPILLARY: 88 mg/dL (ref 65–99)
GLUCOSE-CAPILLARY: 168 mg/dL — AB (ref 65–99)
Glucose-Capillary: 175 mg/dL — ABNORMAL HIGH (ref 65–99)
Glucose-Capillary: 150 mg/dL — ABNORMAL HIGH (ref 65–99)

## 2015-07-21 LAB — CBC
HEMATOCRIT: 31.8 % — AB (ref 36.0–46.0)
HEMOGLOBIN: 10.7 g/dL — AB (ref 12.0–15.0)
MCH: 30.1 pg (ref 26.0–34.0)
MCHC: 33.6 g/dL (ref 30.0–36.0)
MCV: 89.6 fL (ref 78.0–100.0)
Platelets: 162 10*3/uL (ref 150–400)
RBC: 3.55 MIL/uL — AB (ref 3.87–5.11)
RDW: 13.4 % (ref 11.5–15.5)
WBC: 10.5 10*3/uL (ref 4.0–10.5)

## 2015-07-21 NOTE — Clinical Social Work Placement (Signed)
   CLINICAL SOCIAL WORK PLACEMENT  NOTE  Date:  07/21/2015  Patient Details  Name: [name] MRN: [id number] Date of Birth: November 27, 1924  Clinical Social Work is seeking post-discharge placement for this patient at the [place] level of care (*CSW will initial, date and re-position this form in  chart as items are completed):  Yes   Patient/family provided with [place] Work Department's list of facilities offering this level of care within the geographic area requested by the patient (or if unable, by the patient's family).  Yes   Patient/family informed of their freedom to choose among providers that offer the needed level of care, that participate in Medicare, Medicaid or managed care program needed by the patient, have an available bed and are willing to accept the patient.  Yes   Patient/family informed of [facility]'s ownership interest in [facility] and [facility], as well as of the fact that they are under no obligation to receive care at these facilities.  PASRR submitted to EDS on 07/19/15     PASRR number received on 07/19/15     Existing PASRR number confirmed on       FL2 transmitted to all facilities in geographic area requested by pt/family on 07/19/15     FL2 transmitted to all facilities within larger geographic area on       Patient informed that his/her managed care company has contracts with or will negotiate with certain facilities, including the following:        Yes   Patient/family informed of bed offers received.  Patient chooses bed at  [facility])     Physician recommends and patient chooses bed at      Patient to be transferred to  [facility]) on  .  Patient to be transferred to facility by ambulance     Patient family notified on   of transfer.  Name of family member notified:        PHYSICIAN       Additional Comment:    _______________________________________________ Carlean Jews, LCSW 07/21/2015, 11:33 AM

## 2015-07-21 NOTE — Clinical Social Work Note (Signed)
Clinical Social Work Assessment  Patient Details  Name: [name] MRN: [id number] Date of Birth: 03-26-1925  Date of referral:  07/21/15               Reason for consult:  Facility Placement                Permission sought to share information with:  Chartered certified accountant granted to share information::  Yes, Verbal Permission Granted  Name::        Agency::     Relationship::     Contact Information:     Housing/Transportation Living arrangements for the past 2 months:  Single Family Home Source of Information:  Adult Children (daughter [name]) Patient Interpreter Needed:  None Criminal Activity/Legal Involvement Pertinent to Current Situation/Hospitalization:    Significant Relationships:  Adult Children Lives with:  Self Do you feel safe going back to the place where you live?    Need for family participation in patient care:  Yes (Comment)  Care giving concerns: [name] is concerned that pt has been confused since her operations.     Social Worker assessment / plan:  CSW attempted to meet with pt but chart reflected pt was confused so CSW called and spoke with pt's daughter/[name].  CSW proviced information regarding SNF process and explained role of CSW.  CSW prompted pt's daughter to discuss pt's history, current needs, how pt will be transported and any questions she had.  CSW encouraged pt's daughter to explore thoughts and feelings related to pt's diagnoses and rehab needs.  CSW sent message to [facility] to let them know that pt's planned discharge for today will not occur and MD states possible d/c tomorrow.    Employment status:  Retired Forensic scientist:    PT Recommendations:  [place] / Referral to community resources:     Patient/Family's Response to care:  Pt confused so daughter was contacted.  [name] stated that she wanted to make sure she was doing all she needed to do for smooth transition to  pt rehab at discharge.  [name] discussed that pt had been living alone prior to admission and she had no history of rehab.  [name] stated that prior to admission pt was working with in home PT services and was doing well.  She stated that pt's "spine got stronger" with the PT.  She is hopeful that rehab will get pt even stronger and back to doing what she was doing before her surger.  Patient/Family's Understanding of and Emotional Response to Diagnosis, Current Treatment, and Prognosis:  Pt confused.  [name] appears to understand pt's health stating that the confusion pt currently has she has had in past with prior operations.  She is hopeful pt will gain clarity soon and grateful for CSW support  Emotional Assessment Appearance:  Other (Comment Required (could not assess spoke with pt's daughter on phone) Attitude/Demeanor/Rapport:   (could not assess) Affect (typically observed):  Unable to Assess Orientation:  Fluctuating Orientation (Suspected and/or reported Sundowners) Alcohol / Substance use:    Psych involvement (Current and /or in the community):  No (Comment)  Discharge Needs  Concerns to be addressed:    Readmission within the last 30 days:    Current discharge risk:    Barriers to Discharge:  No Barriers Identified   Carlean Jews, LCSW 07/21/2015, 11:24 AM

## 2015-07-21 NOTE — Progress Notes (Signed)
Physical Therapy Treatment Patient Details Name: [name] MRN: [id number] DOB: Apr 06, 1925 Today's Date: 07/21/2015    History of Present Illness L TKR; hx dementia, CVA, Cabg, R TKR, R RCR, DM    PT Comments    Pt progressing steadily with mobility and very cooperative but continues impulsive and distractible.    Follow Up Recommendations  SNF     Equipment Recommendations  None recommended by PT    Recommendations for Other Services OT consult     Precautions / Restrictions Precautions Precautions: Fall Required Braces or Orthoses: Knee Immobilizer - Left Knee Immobilizer - Left: Discontinue once straight leg raise with < 10 degree lag Restrictions Weight Bearing Restrictions: No Other Position/Activity Restrictions: WBAT    Mobility  Bed Mobility Overal bed mobility: Needs Assistance Bed Mobility: Supine to Sit     Supine to sit: Min assist     General bed mobility comments: cues for sequence and use of R LE to self assist.   Transfers Overall transfer level: Needs assistance Equipment used: Rolling walker (2 wheeled) Transfers: Sit to/from Stand Sit to Stand: Min assist;From elevated surface         General transfer comment: cues for LE management and use of UEs to self assist  Ambulation/Gait Ambulation/Gait assistance: Min assist Ambulation Distance (Feet): 70 Feet Assistive device: Rolling walker (2 wheeled) Gait Pattern/deviations: Step-to pattern;Decreased step length - right;Decreased step length - left;Shuffle;Trunk flexed Gait velocity: decr   General Gait Details: cues for sequence, posture, position from RW and increased UE WB   Stairs            Wheelchair Mobility    Modified Rankin (Stroke Patients Only)       Balance                                    Cognition Arousal/Alertness: Awake/alert Behavior During Therapy: WFL for tasks assessed/performed Overall Cognitive Status: History of cognitive  impairments - at baseline                      Exercises Total Joint Exercises Ankle Circles/Pumps: AROM;Both;15 reps;Supine Quad Sets: AROM;AAROM;Both;10 reps;Supine Heel Slides: AAROM;Left;Supine;20 reps Straight Leg Raises: AAROM;Left;Supine;20 reps    General Comments        Pertinent Vitals/Pain Pain Assessment: Faces Faces Pain Scale: Hurts little more Pain Location: L knee Pain Descriptors / Indicators: Aching;Sore Pain Intervention(s): Limited activity within patient's tolerance;Monitored during session;Premedicated before session;Ice applied    Home Living                      Prior Function            PT Goals (current goals can now be found in the care plan section) Acute Rehab PT Goals Patient Stated Goal: I dont want this to hurt PT Goal Formulation: With patient Time For Goal Achievement: 07/26/15 Potential to Achieve Goals: Good Progress towards PT goals: Progressing toward goals    Frequency  7X/week    PT Plan Current plan remains appropriate    Co-evaluation             End of Session Equipment Utilized During Treatment: Gait belt;Left knee immobilizer Activity Tolerance: Patient tolerated treatment well Patient left: in chair;with call bell/phone within reach     Time: 0901-0945 PT Time Calculation (min) (ACUTE ONLY): 44 min  Charges:  $Gait Training:  8-22 mins $Therapeutic Exercise: 8-22 mins $Therapeutic Activity: 8-22 mins                    G Codes:      [name] 2015-08-13, 12:19 PM

## 2015-07-21 NOTE — Progress Notes (Addendum)
Subjective: 2 Days Post-Op Procedure(s) (LRB): LEFT TOTAL KNEE ARTHROPLASTY (Left)  Patient reports pain as mild to moderate.  Patient has appetite and eating breakfast this am.  Denies BM, but admits to flatulence.  Admits to being less confused today.  Objective:   VITALS:  Temp:  [97.7 F (36.5 C)-98.1 F (36.7 C)] 97.9 F (36.6 C) (08/28 0605) Pulse Rate:  [62-80] 62 (08/28 0605) Resp:  [14-18] 17 (08/28 0605) BP: (124-149)/(48-92) 149/57 mmHg (08/28 0605) SpO2:  [95 %-99 %] 99 % (08/28 0605)  General: WDWN patient in NAD. Psych:  Appropriate mood and affect. Neuro:  A&O x 3, Moving all extremities, sensation intact to light touch HEENT:  EOMs intact Chest:  Even non-labored respirations Skin: Drain dressing changed.  Aquacel left in place, C/D/I. Extremities: warm/dry, mild diffuse edema L LE, no erythmea.  No lymphadenopathy. Pulses: Popliteus 2+ MSK:  ROM:  Full ankle PF/DF, MMT:  Patient can perform quad set, (-) Homan's    LABS  Recent Labs  07/20/15 0440 07/21/15 0522  HGB 11.5* 10.7*  WBC 9.7 10.5  PLT 165 162    Recent Labs  07/20/15 0440 07/21/15 0522  NA 135 134*  K 4.6 4.4  CL 100* 105  CO2 27 24  BUN 29* 37*  CREATININE 1.34* 1.42*  GLUCOSE 159* 143*   No results for input(s): LABPT, INR in the last 72 hours.   Assessment/Plan: 2 Days Post-Op Procedure(s) (LRB): LEFT TOTAL KNEE ARTHROPLASTY (Left)  Up with therapy Plan for discharge tomorrow  Dressing changed  Mechele Claude, Hershal Coria, [place] Office:  [phone]

## 2015-07-21 NOTE — Clinical Social Work Note (Signed)
CSW reviewed pt chart that reflected MD note stating pt will d/c tomorrow  CSW called and spoke with pt's daughter to let her know that pt will not d/c today  CSW also left message for [facility] to let them know pt will not d/c to them today  .Dede Query, LCSW [facility] Clinical Social Worker - Weekend Coverage cell #: [phone]

## 2015-07-21 NOTE — Progress Notes (Signed)
Physical Therapy Treatment Patient Details Name: [name] MRN: [id number] DOB: [birth year] Today's Date: 07/21/2015    History of Present Illness L TKR; hx dementia, CVA, Cabg, R TKR, R RCR, DM    PT Comments    Steady progress with mobility.  Pt very motivated but mildly impulsive and distractible.   Follow Up Recommendations  SNF     Equipment Recommendations  None recommended by PT    Recommendations for Other Services OT consult     Precautions / Restrictions Precautions Precautions: Fall Required Braces or Orthoses: Knee Immobilizer - Left Knee Immobilizer - Left: Discontinue once straight leg raise with < 10 degree lag Restrictions Weight Bearing Restrictions: No Other Position/Activity Restrictions: WBAT    Mobility  Bed Mobility Overal bed mobility: Needs Assistance Bed Mobility: Sit to Supine       Sit to supine: Min assist   General bed mobility comments: cues for sequence and use of R LE to self assist.   Transfers Overall transfer level: Needs assistance Equipment used: Rolling walker (2 wheeled) Transfers: Sit to/from Stand Sit to Stand: Min assist;Mod assist Stand pivot transfers: Min assist       General transfer comment: cues for LE management and use of UEs to self assist; stand/pvt to/from [facility] with RW  Ambulation/Gait Ambulation/Gait assistance: Min assist Ambulation Distance (Feet): 80 Feet Assistive device: Rolling walker (2 wheeled) Gait Pattern/deviations: Step-to pattern;Shuffle;Trunk flexed Gait velocity: decr   General Gait Details: cues for sequence, posture, position from [employer]            Wheelchair Mobility    Modified Rankin (Stroke Patients Only)       Balance                                    Cognition Arousal/Alertness: Awake/alert Behavior During Therapy: WFL for tasks assessed/performed Overall Cognitive Status: History of cognitive impairments - at baseline                       Exercises      General Comments        Pertinent Vitals/Pain Pain Assessment: 0-10 Pain Score: 5  Pain Location: L knee Pain Descriptors / Indicators: Aching;Sore Pain Intervention(s): Limited activity within patient's tolerance;Monitored during session;Premedicated before session;Ice applied    Home Living                      Prior Function            PT Goals (current goals can now be found in the care plan section) Acute Rehab PT Goals Patient Stated Goal: I dont want this to hurt PT Goal Formulation: With patient Time For Goal Achievement: 07/26/15 Potential to Achieve Goals: Good Progress towards PT goals: Progressing toward goals    Frequency  7X/week    PT Plan Current plan remains appropriate    Co-evaluation             End of Session Equipment Utilized During Treatment: Gait belt;Left knee immobilizer Activity Tolerance: Patient tolerated treatment well Patient left: in bed;with call bell/phone within reach;with family/visitor present     Time: 3329-5188 PT Time Calculation (min) (ACUTE ONLY): 30 min  Charges:  $Gait Training: 23-37 mins                    G Codes:  [name] 07/21/2015, 4:06 PM

## 2015-07-22 ENCOUNTER — Telehealth: Payer: Self-pay | Admitting: Internal Medicine

## 2015-07-22 ENCOUNTER — Encounter (HOSPITAL_COMMUNITY): Payer: Self-pay | Admitting: Orthopedic Surgery

## 2015-07-22 DIAGNOSIS — M62838 Other muscle spasm: Secondary | ICD-10-CM | POA: Diagnosis not present

## 2015-07-22 DIAGNOSIS — K5710 Diverticulosis of small intestine without perforation or abscess without bleeding: Secondary | ICD-10-CM | POA: Diagnosis not present

## 2015-07-22 DIAGNOSIS — K219 Gastro-esophageal reflux disease without esophagitis: Secondary | ICD-10-CM | POA: Diagnosis not present

## 2015-07-22 DIAGNOSIS — I4891 Unspecified atrial fibrillation: Secondary | ICD-10-CM | POA: Diagnosis not present

## 2015-07-22 DIAGNOSIS — M1712 Unilateral primary osteoarthritis, left knee: Secondary | ICD-10-CM | POA: Diagnosis not present

## 2015-07-22 DIAGNOSIS — Z96652 Presence of left artificial knee joint: Secondary | ICD-10-CM | POA: Diagnosis not present

## 2015-07-22 DIAGNOSIS — E039 Hypothyroidism, unspecified: Secondary | ICD-10-CM | POA: Diagnosis not present

## 2015-07-22 DIAGNOSIS — E785 Hyperlipidemia, unspecified: Secondary | ICD-10-CM | POA: Diagnosis not present

## 2015-07-22 DIAGNOSIS — I480 Paroxysmal atrial fibrillation: Secondary | ICD-10-CM | POA: Diagnosis not present

## 2015-07-22 DIAGNOSIS — R41841 Cognitive communication deficit: Secondary | ICD-10-CM | POA: Diagnosis not present

## 2015-07-22 DIAGNOSIS — R278 Other lack of coordination: Secondary | ICD-10-CM | POA: Diagnosis not present

## 2015-07-22 DIAGNOSIS — M6281 Muscle weakness (generalized): Secondary | ICD-10-CM | POA: Diagnosis not present

## 2015-07-22 DIAGNOSIS — I2510 Atherosclerotic heart disease of native coronary artery without angina pectoris: Secondary | ICD-10-CM | POA: Diagnosis not present

## 2015-07-22 DIAGNOSIS — R1314 Dysphagia, pharyngoesophageal phase: Secondary | ICD-10-CM | POA: Diagnosis not present

## 2015-07-22 DIAGNOSIS — F0390 Unspecified dementia without behavioral disturbance: Secondary | ICD-10-CM | POA: Diagnosis not present

## 2015-07-22 DIAGNOSIS — D62 Acute posthemorrhagic anemia: Secondary | ICD-10-CM | POA: Diagnosis not present

## 2015-07-22 DIAGNOSIS — E118 Type 2 diabetes mellitus with unspecified complications: Secondary | ICD-10-CM | POA: Diagnosis not present

## 2015-07-22 DIAGNOSIS — F329 Major depressive disorder, single episode, unspecified: Secondary | ICD-10-CM | POA: Diagnosis not present

## 2015-07-22 DIAGNOSIS — I10 Essential (primary) hypertension: Secondary | ICD-10-CM | POA: Diagnosis not present

## 2015-07-22 DIAGNOSIS — M1990 Unspecified osteoarthritis, unspecified site: Secondary | ICD-10-CM | POA: Diagnosis not present

## 2015-07-22 DIAGNOSIS — Z471 Aftercare following joint replacement surgery: Secondary | ICD-10-CM | POA: Diagnosis not present

## 2015-07-22 DIAGNOSIS — E119 Type 2 diabetes mellitus without complications: Secondary | ICD-10-CM | POA: Diagnosis not present

## 2015-07-22 DIAGNOSIS — R2681 Unsteadiness on feet: Secondary | ICD-10-CM | POA: Diagnosis not present

## 2015-07-22 DIAGNOSIS — I259 Chronic ischemic heart disease, unspecified: Secondary | ICD-10-CM | POA: Diagnosis not present

## 2015-07-22 LAB — CBC
HCT: 32.4 % — ABNORMAL LOW (ref 36.0–46.0)
Hemoglobin: 10.7 g/dL — ABNORMAL LOW (ref 12.0–15.0)
MCH: 29.9 pg (ref 26.0–34.0)
MCHC: 33 g/dL (ref 30.0–36.0)
MCV: 90.5 fL (ref 78.0–100.0)
PLATELETS: 167 10*3/uL (ref 150–400)
RBC: 3.58 MIL/uL — ABNORMAL LOW (ref 3.87–5.11)
RDW: 13.6 % (ref 11.5–15.5)
WBC: 9.6 10*3/uL (ref 4.0–10.5)

## 2015-07-22 LAB — GLUCOSE, CAPILLARY
GLUCOSE-CAPILLARY: 97 mg/dL (ref 65–99)
Glucose-Capillary: 168 mg/dL — ABNORMAL HIGH (ref 65–99)

## 2015-07-22 MED ORDER — BISACODYL 10 MG RE SUPP
10.0000 mg | Freq: Once | RECTAL | Status: AC
  Administered 2015-07-22: 10 mg via RECTAL
  Filled 2015-07-22: qty 1

## 2015-07-22 NOTE — Discharge Summary (Signed)
Physician Discharge Summary   Patient ID: [name] MRN: [id number] DOB/AGE: [birth year] 81 y.o.  Admit date: 07/19/2015 Discharge date: 07/22/2015  Primary Diagnosis: Primary osteoarthritis, left knee  Admission Diagnoses:  Past Medical History  Diagnosis Date  . Sliding hiatal hernia   . Erosive esophagitis   . Peptic stricture of esophagus   . Diverticulosis   . Arthritis   . Hypothyroidism   . Diabetes mellitus     Type I  . Sick sinus syndrome     s/p Medtronic DDD pacemaker implanted; generator change 02/2012  . Hypertension   . Hyperlipidemia   . GERD (gastroesophageal reflux disease)   . Adenomatous colon polyp 07/2002  . Obesity   . Paroxysmal atrial fibrillation 02/04/15    discovered on PPM remote interrogation 6/16  . Coronary artery disease     s/p CABG 2001 x 5  . CVA (cerebral vascular accident) 2013 or 2014    Dr Evelena Leyden, Neurology  . Dementia   . History of swelling of feet     props up at night, circulations has been checked inlegs in past and was told it was ok   Discharge Diagnoses:   Active Problems:   History of total knee arthroplasty  Estimated body mass index is 34.07 kg/(m^2) as calculated from the following:   Height as of this encounter: 5' 5.5" (1.664 m).   Weight as of this encounter: 94.348 kg (208 lb).  Procedure:  Procedure(s) (LRB): LEFT TOTAL KNEE ARTHROPLASTY (Left)   Consults: None  HPI: [name], 81 y.o. female, has a history of pain and functional disability in the left knee due to arthritis and has failed non-surgical conservative treatments for greater than 12 weeks to includeNSAID's and/or analgesics, corticosteriod injections, use of assistive devices and activity modification. Onset of symptoms was gradual, starting >10 years ago with gradually worsening course since that time. The patient noted no past surgery on the left knee(s). Patient currently rates pain in the left knee(s) at 8 out of 10 with activity. Patient has  night pain, worsening of pain with activity and weight bearing, pain that interferes with activities of daily living, pain with passive range of motion, crepitus and joint swelling. Patient has evidence of periarticular osteophytes and joint space narrowing by imaging studies. There is no active infection.  Laboratory Data: Admission on 07/19/2015  Component Date Value Ref Range Status  . Glucose-Capillary 07/19/2015 120* 65 - 99 mg/dL Final  . Comment 1 07/19/2015 Notify RN   Final  . Glucose-Capillary 07/19/2015 173* 65 - 99 mg/dL Final  . WBC 07/20/2015 9.7  4.0 - 10.5 K/uL Final  . RBC 07/20/2015 3.87  3.87 - 5.11 MIL/uL Final  . Hemoglobin 07/20/2015 11.5* 12.0 - 15.0 g/dL Final  . HCT 07/20/2015 34.3* 36.0 - 46.0 % Final  . MCV 07/20/2015 88.6  78.0 - 100.0 fL Final  . MCH 07/20/2015 29.7  26.0 - 34.0 pg Final  . MCHC 07/20/2015 33.5  30.0 - 36.0 g/dL Final  . RDW 07/20/2015 13.2  11.5 - 15.5 % Final  . Platelets 07/20/2015 165  150 - 400 K/uL Final  . Sodium 07/20/2015 135  135 - 145 mmol/L Final  . Potassium 07/20/2015 4.6  3.5 - 5.1 mmol/L Final  . Chloride 07/20/2015 100* 101 - 111 mmol/L Final  . CO2 07/20/2015 27  22 - 32 mmol/L Final  . Glucose, Bld 07/20/2015 159* 65 - 99 mg/dL Final  . BUN 07/20/2015 29* 6 -  20 mg/dL Final  . Creatinine, Ser 07/20/2015 1.34* 0.44 - 1.00 mg/dL Final  . Calcium 07/20/2015 9.3  8.9 - 10.3 mg/dL Final  . GFR calc non Af Amer 07/20/2015 35* >60 mL/min Final  . GFR calc Af Amer 07/20/2015 40* >60 mL/min Final   Comment: (NOTE) The eGFR has been calculated using the CKD EPI equation. This calculation has not been validated in all clinical situations. eGFR's persistently <60 mL/min signify possible Chronic Kidney Disease.   . Anion gap 07/20/2015 8  5 - 15 Final  . Glucose-Capillary 07/19/2015 171* 65 - 99 mg/dL Final  . Glucose-Capillary 07/19/2015 173* 65 - 99 mg/dL Final  . Glucose-Capillary 07/20/2015 151* 65 - 99 mg/dL Final  .  Glucose-Capillary 07/20/2015 176* 65 - 99 mg/dL Final  . Comment 1 07/20/2015 Notify RN   Final  . Comment 2 07/20/2015 Document in Chart   Final  . Glucose-Capillary 07/20/2015 152* 65 - 99 mg/dL Final  . WBC 07/21/2015 10.5  4.0 - 10.5 K/uL Final  . RBC 07/21/2015 3.55* 3.87 - 5.11 MIL/uL Final  . Hemoglobin 07/21/2015 10.7* 12.0 - 15.0 g/dL Final  . HCT 07/21/2015 31.8* 36.0 - 46.0 % Final  . MCV 07/21/2015 89.6  78.0 - 100.0 fL Final  . MCH 07/21/2015 30.1  26.0 - 34.0 pg Final  . MCHC 07/21/2015 33.6  30.0 - 36.0 g/dL Final  . RDW 07/21/2015 13.4  11.5 - 15.5 % Final  . Platelets 07/21/2015 162  150 - 400 K/uL Final  . Sodium 07/21/2015 134* 135 - 145 mmol/L Final  . Potassium 07/21/2015 4.4  3.5 - 5.1 mmol/L Final  . Chloride 07/21/2015 105  101 - 111 mmol/L Final  . CO2 07/21/2015 24  22 - 32 mmol/L Final  . Glucose, Bld 07/21/2015 143* 65 - 99 mg/dL Final  . BUN 07/21/2015 37* 6 - 20 mg/dL Final  . Creatinine, Ser 07/21/2015 1.42* 0.44 - 1.00 mg/dL Final  . Calcium 07/21/2015 8.9  8.9 - 10.3 mg/dL Final  . GFR calc non Af Amer 07/21/2015 32* >60 mL/min Final  . GFR calc Af Amer 07/21/2015 37* >60 mL/min Final   Comment: (NOTE) The eGFR has been calculated using the CKD EPI equation. This calculation has not been validated in all clinical situations. eGFR's persistently <60 mL/min signify possible Chronic Kidney Disease.   . Anion gap 07/21/2015 5  5 - 15 Final  . Glucose-Capillary 07/20/2015 181* 65 - 99 mg/dL Final  . Glucose-Capillary 07/21/2015 175* 65 - 99 mg/dL Final  . Glucose-Capillary 07/21/2015 150* 65 - 99 mg/dL Final  . WBC 07/22/2015 9.6  4.0 - 10.5 K/uL Final  . RBC 07/22/2015 3.58* 3.87 - 5.11 MIL/uL Final  . Hemoglobin 07/22/2015 10.7* 12.0 - 15.0 g/dL Final  . HCT 07/22/2015 32.4* 36.0 - 46.0 % Final  . MCV 07/22/2015 90.5  78.0 - 100.0 fL Final  . MCH 07/22/2015 29.9  26.0 - 34.0 pg Final  . MCHC 07/22/2015 33.0  30.0 - 36.0 g/dL Final  . RDW  07/22/2015 13.6  11.5 - 15.5 % Final  . Platelets 07/22/2015 167  150 - 400 K/uL Final  . Glucose-Capillary 07/21/2015 88  65 - 99 mg/dL Final  . Glucose-Capillary 07/21/2015 168* 65 - 99 mg/dL Final  . Glucose-Capillary 07/22/2015 97  65 - 99 mg/dL Final  Hospital Outpatient Visit on 07/15/2015  Component Date Value Ref Range Status  . WBC 07/15/2015 7.5  4.0 - 10.5 K/uL Final  .  RBC 07/15/2015 4.07  3.87 - 5.11 MIL/uL Final  . Hemoglobin 07/15/2015 12.1  12.0 - 15.0 g/dL Final  . HCT 07/15/2015 36.9  36.0 - 46.0 % Final  . MCV 07/15/2015 90.7  78.0 - 100.0 fL Final  . MCH 07/15/2015 29.7  26.0 - 34.0 pg Final  . MCHC 07/15/2015 32.8  30.0 - 36.0 g/dL Final  . RDW 07/15/2015 13.5  11.5 - 15.5 % Final  . Platelets 07/15/2015 186  150 - 400 K/uL Final  . Neutrophils Relative % 07/15/2015 46  43 - 77 % Final  . Neutro Abs 07/15/2015 3.4  1.7 - 7.7 K/uL Final  . Lymphocytes Relative 07/15/2015 44  12 - 46 % Final  . Lymphs Abs 07/15/2015 3.3  0.7 - 4.0 K/uL Final  . Monocytes Relative 07/15/2015 8  3 - 12 % Final  . Monocytes Absolute 07/15/2015 0.6  0.1 - 1.0 K/uL Final  . Eosinophils Relative 07/15/2015 2  0 - 5 % Final  . Eosinophils Absolute 07/15/2015 0.2  0.0 - 0.7 K/uL Final  . Basophils Relative 07/15/2015 0  0 - 1 % Final  . Basophils Absolute 07/15/2015 0.0  0.0 - 0.1 K/uL Final  . Sodium 07/15/2015 137  135 - 145 mmol/L Final  . Potassium 07/15/2015 4.9  3.5 - 5.1 mmol/L Final  . Chloride 07/15/2015 105  101 - 111 mmol/L Final  . CO2 07/15/2015 26  22 - 32 mmol/L Final  . Glucose, Bld 07/15/2015 115* 65 - 99 mg/dL Final  . BUN 07/15/2015 35* 6 - 20 mg/dL Final  . Creatinine, Ser 07/15/2015 1.45* 0.44 - 1.00 mg/dL Final  . Calcium 07/15/2015 9.9  8.9 - 10.3 mg/dL Final  . Total Protein 07/15/2015 7.0  6.5 - 8.1 g/dL Final  . Albumin 07/15/2015 4.1  3.5 - 5.0 g/dL Final  . AST 07/15/2015 26  15 - 41 U/L Final  . ALT 07/15/2015 33  14 - 54 U/L Final  . Alkaline Phosphatase  07/15/2015 32* 38 - 126 U/L Final  . Total Bilirubin 07/15/2015 0.4  0.3 - 1.2 mg/dL Final  . GFR calc non Af Amer 07/15/2015 31* >60 mL/min Final  . GFR calc Af Amer 07/15/2015 36* >60 mL/min Final   Comment: (NOTE) The eGFR has been calculated using the CKD EPI equation. This calculation has not been validated in all clinical situations. eGFR's persistently <60 mL/min signify possible Chronic Kidney Disease.   . Anion gap 07/15/2015 6  5 - 15 Final  . Prothrombin Time 07/15/2015 15.5* 11.6 - 15.2 seconds Final  . INR 07/15/2015 1.21  0.00 - 1.49 Final  . ABO/RH(D) 07/15/2015 A NEG   Final  . Antibody Screen 07/15/2015 NEG   Final  . Sample Expiration 07/15/2015 07/22/2015   Final  . Color, Urine 07/15/2015 YELLOW  YELLOW Final  . APPearance 07/15/2015 CLEAR  CLEAR Final  . Specific Gravity, Urine 07/15/2015 1.007  1.005 - 1.030 Final  . pH 07/15/2015 5.5  5.0 - 8.0 Final  . Glucose, UA 07/15/2015 500* NEGATIVE mg/dL Final  . Hgb urine dipstick 07/15/2015 NEGATIVE  NEGATIVE Final  . Bilirubin Urine 07/15/2015 NEGATIVE  NEGATIVE Final  . Ketones, ur 07/15/2015 NEGATIVE  NEGATIVE mg/dL Final  . Protein, ur 07/15/2015 NEGATIVE  NEGATIVE mg/dL Final  . Urobilinogen, UA 07/15/2015 0.2  0.0 - 1.0 mg/dL Final  . Nitrite 07/15/2015 NEGATIVE  NEGATIVE Final  . Leukocytes, UA 07/15/2015 NEGATIVE  NEGATIVE Final   MICROSCOPIC NOT DONE  ON URINES WITH NEGATIVE PROTEIN, BLOOD, LEUKOCYTES, NITRITE, OR GLUCOSE <1000 mg/dL.  Marland Kitchen aPTT 07/15/2015 30  24 - 37 seconds Final  . MRSA, PCR 07/15/2015 NEGATIVE  NEGATIVE Final  . Staphylococcus aureus 07/15/2015 NEGATIVE  NEGATIVE Final   Comment:        The Xpert SA Assay (FDA approved for NASAL specimens in patients over 49 years of age), is one component of a comprehensive surveillance program.  Test performance has been validated by [facility] for patients greater than or equal to 52 year old. It is not intended to diagnose infection nor  to guide or monitor treatment.   Anti-coag visit on 06/19/2015  Component Date Value Ref Range Status  . Sodium 06/19/2015 137  135 - 145 mEq/L Final  . Potassium 06/19/2015 4.7  3.5 - 5.1 mEq/L Final  . Chloride 06/19/2015 104  96 - 112 mEq/L Final  . CO2 06/19/2015 28  19 - 32 mEq/L Final  . Glucose, Bld 06/19/2015 131* 70 - 99 mg/dL Final  . BUN 06/19/2015 40* 6 - 23 mg/dL Final  . Creatinine, Ser 06/19/2015 1.57* 0.40 - 1.20 mg/dL Final  . Calcium 06/19/2015 9.9  8.4 - 10.5 mg/dL Final  . GFR 06/19/2015 33.08* >60.00 mL/min Final  . WBC 06/19/2015 8.9  4.0 - 10.5 K/uL Final  . RBC 06/19/2015 4.27  3.87 - 5.11 Mil/uL Final  . Platelets 06/19/2015 184.0  150.0 - 400.0 K/uL Final  . Hemoglobin 06/19/2015 12.7  12.0 - 15.0 g/dL Final  . HCT 06/19/2015 37.7  36.0 - 46.0 % Final  . MCV 06/19/2015 88.3  78.0 - 100.0 fl Final  . MCHC 06/19/2015 33.6  30.0 - 36.0 g/dL Final  . RDW 06/19/2015 13.2  11.5 - 15.5 % Final  Office Visit on 05/29/2015  Component Date Value Ref Range Status  . Hemoglobin A1C 05/29/2015 6.7   Final     X-Rays:Dg Chest 2 View  07/15/2015   CLINICAL DATA:  Preoperative clearance for left knee replacement  EXAM: CHEST  2 VIEW  COMPARISON:  03/12/2012  FINDINGS: Prior CABG. Right pacer remains in place, unchanged. Heart and mediastinal contours are within normal limits. No focal opacities or effusions. No acute bony abnormality.  IMPRESSION: No active cardiopulmonary disease.   Electronically Signed   By: Rolm Baptise M.D.   On: 07/15/2015 13:48    EKG: Orders placed or performed in visit on 05/20/15  . EKG 12-Lead     Hospital Course: KAMIRAH SHUGRUE is a 81 y.o. who was admitted to [facility]. They were brought to the operating room on 07/19/2015 and underwent Procedure(s): LEFT TOTAL KNEE ARTHROPLASTY.  Patient tolerated the procedure well and was later transferred to the recovery room and then to the orthopaedic floor for postoperative care.  They were  given PO and IV analgesics for pain control following their surgery.  They were given 24 hours of postoperative antibiotics of  Anti-infectives    Start     Dose/Rate Route Frequency Ordered Stop   07/19/15 1400  ceFAZolin (ANCEF) IVPB 1 g/50 mL premix     1 g 100 mL/hr over 30 Minutes Intravenous Every 6 hours 07/19/15 1215 07/19/15 2006   07/19/15 0804  polymyxin B 500,000 Units, bacitracin 50,000 Units in sodium chloride irrigation 0.9 % 500 mL irrigation  Status:  Discontinued       As needed 07/19/15 0804 07/19/15 0937   07/19/15 0535  ceFAZolin (ANCEF) IVPB 2 g/50 mL premix  2 g 100 mL/hr over 30 Minutes Intravenous On call to O.R. 07/19/15 [id number] 07/19/15 0724     and started on DVT prophylaxis in the form of Eliquis.   PT and OT were ordered for total joint protocol.  Discharge planning consulted to help with postop disposition and equipment needs.  Patient had a fair night on the evening of surgery. She had some confusion with Dilaudid and was changed to Tramadol. They started to get up OOB with therapy on day one. Hemovac drain was pulled without difficulty.  Continued to work with therapy into day two. By day three, the patient had progressed with therapy and meeting their goals.  Incision was healing well.  Patient was seen in rounds and was ready to go to SNF.   Diet: Cardiac diet Activity:WBAT Follow-up:in 2 weeks Disposition - Skilled nursing facility Discharged Condition: stable   Discharge Instructions    Call MD / Call 911    Complete by:  As directed   If you experience chest pain or shortness of breath, CALL 911 and be transported to the hospital emergency room.  If you develope a fever above 101 F, pus (white drainage) or increased drainage or redness at the wound, or calf pain, call your surgeon's office.     Constipation Prevention    Complete by:  As directed   Drink plenty of fluids.  Prune juice may be helpful.  You may use a stool softener, such as Colace (over  the counter) 100 mg twice a day.  Use MiraLax (over the counter) for constipation as needed.     Diet - low sodium heart healthy    Complete by:  As directed      Discharge instructions    Complete by:  As directed   INSTRUCTIONS AFTER JOINT REPLACEMENT   Remove items at home which could result in a fall. This includes throw rugs or furniture in walking pathways ICE to the affected joint every three hours while awake for 30 minutes at a time, for at least the first 3-5 days, and then as needed for pain and swelling.  Continue to use ice for pain and swelling. You may notice swelling that will progress down to the foot and ankle.  This is normal after surgery.  Elevate your leg when you are not up walking on it.   Continue to use the breathing machine you got in the hospital (incentive spirometer) which will help keep your temperature down.  It is common for your temperature to cycle up and down following surgery, especially at night when you are not up moving around and exerting yourself.  The breathing machine keeps your lungs expanded and your temperature down.   DIET:  As you were doing prior to hospitalization, we recommend a well-balanced diet.  DRESSING / WOUND CARE / SHOWERING  Keep the surgical dressing until follow up.  The dressing is water proof, so you can shower without any extra covering.  IF THE DRESSING FALLS OFF or the wound gets wet inside, change the dressing with sterile gauze.  Please use good hand washing techniques before changing the dressing.  Do not use any lotions or creams on the incision until instructed by your surgeon.    ACTIVITY  Increase activity slowly as tolerated, but follow the weight bearing instructions below.   No driving for 6 weeks or until further direction given by your physician.  You cannot drive while taking narcotics.  No lifting or carrying greater than  10 lbs. until further directed by your surgeon. Avoid periods of inactivity such as sitting  longer than an hour when not asleep. This helps prevent blood clots.  You may return to work once you are authorized by your doctor.     WEIGHT BEARING   Weight bearing as tolerated with assist device (walker, cane, etc) as directed, use it as long as suggested by your surgeon or therapist, typically at least 4-6 weeks.   EXERCISES  Results after joint replacement surgery are often greatly improved when you follow the exercise, range of motion and muscle strengthening exercises prescribed by your doctor. Safety measures are also important to protect the joint from further injury. Any time any of these exercises cause you to have increased pain or swelling, decrease what you are doing until you are comfortable again and then slowly increase them. If you have problems or questions, call your caregiver or physical therapist for advice.   Rehabilitation is important following a joint replacement. After just a few days of immobilization, the muscles of the leg can become weakened and shrink (atrophy).  These exercises are designed to build up the tone and strength of the thigh and leg muscles and to improve motion. Often times heat used for twenty to thirty minutes before working out will loosen up your tissues and help with improving the range of motion but do not use heat for the first two weeks following surgery (sometimes heat can increase post-operative swelling).   These exercises can be done on a training (exercise) mat, on the floor, on a table or on a bed. Use whatever works the best and is most comfortable for you.    Use music or television while you are exercising so that the exercises are a pleasant break in your day. This will make your life better with the exercises acting as a break in your routine that you can look forward to.   Perform all exercises about fifteen times, three times per day or as directed.  You should exercise both the operative leg and the other leg as well.  Exercises  include:   Quad Sets - Tighten up the muscle on the front of the thigh (Quad) and hold for 5-10 seconds.   Straight Leg Raises - With your knee straight (if you were given a brace, keep it on), lift the leg to 60 degrees, hold for 3 seconds, and slowly lower the leg.  Perform this exercise against resistance later as your leg gets stronger.  Leg Slides: Lying on your back, slowly slide your foot toward your buttocks, bending your knee up off the floor (only go as far as is comfortable). Then slowly slide your foot back down until your leg is flat on the floor again.  Angel Wings: Lying on your back spread your legs to the side as far apart as you can without causing discomfort.  Hamstring Strength:  Lying on your back, push your heel against the floor with your leg straight by tightening up the muscles of your buttocks.  Repeat, but this time bend your knee to a comfortable angle, and push your heel against the floor.  You may put a pillow under the heel to make it more comfortable if necessary.   A rehabilitation program following joint replacement surgery can speed recovery and prevent re-injury in the future due to weakened muscles. Contact your doctor or a physical therapist for more information on knee rehabilitation.    CONSTIPATION  Constipation is defined  medically as fewer than three stools per week and severe constipation as less than one stool per week.  Even if you have a regular bowel pattern at home, your normal regimen is likely to be disrupted due to multiple reasons following surgery.  Combination of anesthesia, postoperative narcotics, change in appetite and fluid intake all can affect your bowels.   YOU MUST use at least one of the following options; they are listed in order of increasing strength to get the job done.  They are all available over the counter, and you may need to use some, POSSIBLY even all of these options:    Drink plenty of fluids (prune juice may be helpful)  and high fiber foods Colace 100 mg by mouth twice a day  Senokot for constipation as directed and as needed Dulcolax (bisacodyl), take with full glass of water  Miralax (polyethylene glycol) once or twice a day as needed.  If you have tried all these things and are unable to have a bowel movement in the first 3-4 days after surgery call either your surgeon or your primary doctor.    If you experience loose stools or diarrhea, hold the medications until you stool forms back up.  If your symptoms do not get better within 1 week or if they get worse, check with your doctor.  If you experience "the worst abdominal pain ever" or develop nausea or vomiting, please contact the office immediately for further recommendations for treatment.   ITCHING:  If you experience itching with your medications, try taking only a single pain pill, or even half a pain pill at a time.  You can also use Benadryl over the counter for itching or also to help with sleep.   TED HOSE STOCKINGS:  Use stockings on both legs until for at least 2 weeks or as directed by physician office. They may be removed at night for sleeping.  MEDICATIONS:  See your medication summary on the "After Visit Summary" that nursing will review with you.  You may have some home medications which will be placed on hold until you complete the course of blood thinner medication.  It is important for you to complete the blood thinner medication as prescribed.  PRECAUTIONS:  If you experience chest pain or shortness of breath - call 911 immediately for transfer to the hospital emergency department.   If you develop a fever greater that 101 F, purulent drainage from wound, increased redness or drainage from wound, foul odor from the wound/dressing, or calf pain - CONTACT YOUR SURGEON.                                                   FOLLOW-UP APPOINTMENTS:  If you do not already have a post-op appointment, please call the office for an appointment to be  seen by your surgeon.  Guidelines for how soon to be seen are listed in your "After Visit Summary", but are typically between 1-4 weeks after surgery.  MAKE SURE YOU:  Understand these instructions.  Get help right away if you are not doing well or get worse.    Thank you for letting us be a part of your medical care team.  It is a privilege we respect greatly.  We hope these instructions will help you stay on track for a fast and full recovery!  Increase activity slowly as tolerated    Complete by:  As directed             Medication List    TAKE these medications        acarbose 50 MG tablet  Commonly known as:  PRECOSE  TAKE 1 TABLET BY MOUTH 3 TIMES A DAY WITH MEALS     amitriptyline 25 MG tablet  Commonly known as:  ELAVIL  TAKE 1 TABLET (25 MG TOTAL) BY MOUTH AT BEDTIME.     apixaban 2.5 MG Tabs tablet  Commonly known as:  ELIQUIS  Take 1 tablet (2.5 mg total) by mouth 2 (two) times daily.     atorvastatin 20 MG tablet  Commonly known as:  LIPITOR  TAKE 1 TABLET BY MOUTH EVERY DAY (IN PLACE OF PRAVASTATIN)     bromocriptine 2.5 MG tablet  Commonly known as:  PARLODEL  TAKE 1 TABLET (2.5 MG TOTAL) BY MOUTH AT BEDTIME.     CALCIUM 500 PO  Take 500 mg by mouth daily.     colesevelam 625 MG tablet  Commonly known as:  WELCHOL  Take 3 tablets (1,875 mg total) by mouth 2 (two) times daily before a meal.     diltiazem 30 MG tablet  Commonly known as:  CARDIZEM  Take 1 tablet (30 mg total) by mouth every 6 (six) hours as needed.     donepezil 10 MG tablet  Commonly known as:  ARICEPT  Take 1 tablet (10 mg total) by mouth at bedtime.     enalapril 20 MG tablet  Commonly known as:  VASOTEC  TAKE 1 TABLET BY MOUTH DAILY     fish oil-omega-3 fatty acids 1000 MG capsule  Take 1 g by mouth daily.     HYDROmorphone 2 MG tablet  Commonly known as:  DILAUDID  Take 1 tablet (2 mg total) by mouth every 4 (four) hours as needed for severe pain.     INVOKANA 100  MG Tabs tablet  Generic drug:  canagliflozin  TAKE 1 TABLET BY MOUTH EVERY DAY     levothyroxine 25 MCG tablet  Commonly known as:  SYNTHROID, LEVOTHROID  TAKE 1 AND 1/2 TABET BY MOUTH EVERY DAY     memantine 28 MG Cp24 24 hr capsule  Commonly known as:  NAMENDA XR  Take 1 capsule (28 mg total) by mouth daily.     NAMENDA XR 28 MG Cp24 24 hr capsule  Generic drug:  memantine  TAKE ONE CAPSULE EVERY DAY     metoprolol 200 MG 24 hr tablet  Commonly known as:  TOPROL-XL  TAKE 1 TABLET BY MOUTH EVERY DAY     niacin 500 MG tablet  Take 500 mg by mouth every evening.     nitroGLYCERIN 0.3 MG SL tablet  Commonly known as:  NITROSTAT  Place 1 tablet (0.3 mg total) under the tongue every 5 (five) minutes as needed. For chest pain     pantoprazole 40 MG tablet  Commonly known as:  PROTONIX  Take 1 tablet (40 mg total) by mouth daily.     repaglinide 2 MG tablet  Commonly known as:  PRANDIN  TAKE 1 TABLET (2 MG TOTAL) BY MOUTH 3 (THREE) TIMES DAILY BEFORE MEALS.     sitaGLIPtin 100 MG tablet  Commonly known as:  JANUVIA  1/2 tab daily     traMADol 50 MG tablet  Commonly known as:  ULTRAM  Take 1-2 tablets (50-100 mg total)  by mouth every 6 (six) hours as needed for moderate pain.           Follow-up Information    Follow up with GIOFFRE,RONALD A, MD. Schedule an appointment as soon as possible for a visit in 2 weeks.   Specialty:  Orthopedic Surgery   Contact information:   [address] [place] [address] [phone]       Signed: Ardeen Jourdain, PA-C Orthopaedic Surgery 07/22/2015, 8:58 AM

## 2015-07-22 NOTE — Care Management Important Message (Signed)
Important Message  Patient Details  Name: [name] MRN: [id number] Date of Birth: [birth year]   Medicare Important Message Given:  Yes-second notification given    Camillo Flaming 07/22/2015, 12:21 [place] Message  Patient Details  Name: [name] MRN: [id number] Date of Birth: 08/12/1925   Medicare Important Message Given:  Yes-second notification given    Camillo Flaming 07/22/2015, 12:21 PM

## 2015-07-22 NOTE — Progress Notes (Signed)
Physical Therapy Treatment Patient Details Name: [name] MRN: [id number] DOB: 06-02-1925 Today's Date: 07/22/2015    History of Present Illness L TKR; hx dementia, CVA, Cabg, R TKR, R RCR, DM    PT Comments    Pt progressing, knee flexion improving after ex today; discussed transport with family and they feel comfortable transporting by car, discussed techniques with them  Follow Up Recommendations  SNF     Equipment Recommendations  None recommended by PT    Recommendations for Other Services       Precautions / Restrictions Precautions Precautions: Fall;Knee Required Braces or Orthoses: Knee Immobilizer - Left Knee Immobilizer - Left: Discontinue once straight leg raise with < 10 degree lag Restrictions Weight Bearing Restrictions: No Other Position/Activity Restrictions: WBAT    Mobility  Bed Mobility Overal bed mobility: Needs Assistance Bed Mobility: Supine to Sit     Supine to sit: Min assist     General bed mobility comments: cues for sequence and use of R LE to self assist.   Transfers Overall transfer level: Needs assistance Equipment used: Rolling walker (2 wheeled) Transfers: Sit to/from Stand Sit to Stand: Min assist         General transfer comment: cues for LE management and use of UEs to self assist;   Ambulation/Gait Ambulation/Gait assistance: Min assist Ambulation Distance (Feet): 65 Feet Assistive device: Rolling walker (2 wheeled) Gait Pattern/deviations: Step-to pattern;Trunk flexed;Decreased step length - right;Decreased step length - left Gait velocity: decr   General Gait Details: cues for sequence, posture, position from RW, assist for walker direction   Stairs            Wheelchair Mobility    Modified Rankin (Stroke Patients Only)       Balance                                    Cognition Arousal/Alertness: Awake/alert Behavior During Therapy: WFL for tasks assessed/performed Overall  Cognitive Status: History of cognitive impairments - at baseline                      Exercises Total Joint Exercises Ankle Circles/Pumps: AROM;Both;15 reps;Supine Heel Slides: AAROM;Left;10 reps Hip ABduction/ADduction: AROM;Left;5 reps Straight Leg Raises: AAROM;Left;10 reps    General Comments        Pertinent Vitals/Pain Pain Assessment: Faces Faces Pain Scale: Hurts a little bit Pain Location: L knee Pain Descriptors / Indicators: Sore Pain Intervention(s): Limited activity within patient's tolerance;Monitored during session;Premedicated before session;Ice applied;Repositioned    Home Living                      Prior Function            PT Goals (current goals can now be found in the care plan section) Acute Rehab PT Goals Patient Stated Goal: I dont want this to hurt PT Goal Formulation: With patient Potential to Achieve Goals: Good Progress towards PT goals: Progressing toward goals    Frequency  7X/week    PT Plan Current plan remains appropriate    Co-evaluation             End of Session Equipment Utilized During Treatment: Gait belt;Left knee immobilizer Activity Tolerance: Patient tolerated treatment well Patient left: in chair;with call bell/phone within reach;with family/visitor present     Time: 7793-9030 PT Time Calculation (min) (ACUTE ONLY): 23 min  Charges:  $Gait Training: 8-22 mins $Therapeutic Exercise: 8-22 mins                    G Codes:      Maryanna Stuber 08-11-15, 11:33 AM

## 2015-07-22 NOTE — Telephone Encounter (Signed)
New message      Pt was discharged today from the hosp --- knee replacement.  Her discharge medication list had a drug listed that she had not been taken prior to surgery.  Is this something she should have been taking?  (diltiazem 30mg  1tab every 6hrs as needed)

## 2015-07-22 NOTE — Progress Notes (Signed)
   Subjective: 3 Days Post-Op Procedure(s) (LRB): LEFT TOTAL KNEE ARTHROPLASTY (Left) Patient reports pain as mild.   Patient seen in rounds for Dr. Gladstone Lighter. Patient is well, and has had no acute complaints or problems. No issues overnight. No SOB pr chest pain. Reports that she is doing well.  Plan is to go Skilled nursing facility after hospital stay.  Objective: Vital signs in last 24 hours: Temp:  [97.4 F (36.3 C)-98 F (36.7 C)] 98 F (36.7 C) (08/29 3220) Pulse Rate:  [60-69] 65 (08/29 0608) Resp:  [16-20] 20 (08/29 0608) BP: (110-149)/(51-73) 149/56 mmHg (08/29 0608) SpO2:  [94 %-100 %] 94 % (08/29 0608)  Intake/Output from previous day:  Intake/Output Summary (Last 24 hours) at 07/22/15 0856 Last data filed at 07/22/15 0609  Gross per 24 hour  Intake    960 ml  Output   2750 ml  Net  -1790 ml    I Labs:  Recent Labs  07/20/15 0440 07/21/15 0522 07/22/15 0503  HGB 11.5* 10.7* 10.7*    Recent Labs  07/21/15 0522 07/22/15 0503  WBC 10.5 9.6  RBC 3.55* 3.58*  HCT 31.8* 32.4*  PLT 162 167    Recent Labs  07/20/15 0440 07/21/15 0522  NA 135 134*  K 4.6 4.4  CL 100* 105  CO2 27 24  BUN 29* 37*  CREATININE 1.34* 1.42*  GLUCOSE 159* 143*  CALCIUM 9.3 8.9    EXAM General - Patient is Alert and Oriented Extremity - Neurologically intact Intact pulses distally Dorsiflexion/Plantar flexion intact No cellulitis present Compartment soft Dressing/Incision - clean, dry, no drainage Motor Function - intact, moving foot and toes well on exam.   Past Medical History  Diagnosis Date  . Sliding hiatal hernia   . Erosive esophagitis   . Peptic stricture of esophagus   . Diverticulosis   . Arthritis   . Hypothyroidism   . Diabetes mellitus     Type I  . Sick sinus syndrome     s/p Medtronic DDD pacemaker implanted; generator change 02/2012  . Hypertension   . Hyperlipidemia   . GERD (gastroesophageal reflux disease)   . Adenomatous colon polyp  07/2002  . Obesity   . Paroxysmal atrial fibrillation 02/04/15    discovered on PPM remote interrogation 6/16  . Coronary artery disease     s/p CABG 2001 x 5  . CVA (cerebral vascular accident) 2013 or 2014    Dr Evelena Leyden, Neurology  . Dementia   . History of swelling of feet     props up at night, circulations has been checked inlegs in past and was told it was ok    Assessment/Plan: 3 Days Post-Op Procedure(s) (LRB): LEFT TOTAL KNEE ARTHROPLASTY (Left) Active Problems:   History of total knee arthroplasty  Estimated body mass index is 34.07 kg/(m^2) as calculated from the following:   Height as of this encounter: 5' 5.5" (1.664 m).   Weight as of this encounter: 94.348 kg (208 lb). Advance diet Up with therapy Discharge to SNF  DVT Prophylaxis - Eliquis Weight-Bearing as tolerated    Continue PT today. Plan for DC to [facility] this afternoon.   Ardeen Jourdain, PA-C Orthopaedic Surgery 07/22/2015, 8:56 AM

## 2015-07-22 NOTE — Clinical Social Work Placement (Signed)
   CLINICAL SOCIAL WORK PLACEMENT  NOTE  Date:  07/22/2015  Patient Details  Name: [name] MRN: [id number] Date of Birth: 27-Mar-1925  Clinical Social Work is seeking post-discharge placement for this patient at the [place] level of care (*CSW will initial, date and re-position this form in  chart as items are completed):  Yes   Patient/family provided with [facility] Work Department's list of facilities offering this level of care within the geographic area requested by the patient (or if unable, by the patient's family).  Yes   Patient/family informed of their freedom to choose among providers that offer the needed level of care, that participate in Medicare, Medicaid or managed care program needed by the patient, have an available bed and are willing to accept the patient.  Yes   Patient/family informed of [facility]'s ownership interest in [facility] and [facility], as well as of the fact that they are under no obligation to receive care at these facilities.  PASRR submitted to EDS on 07/19/15     PASRR number received on 07/19/15     Existing PASRR number confirmed on       FL2 transmitted to all facilities in geographic area requested by pt/family on 07/19/15     FL2 transmitted to all facilities within larger geographic area on       Patient informed that his/her managed care company has contracts with or will negotiate with certain facilities, including the following:        Yes   Patient/family informed of bed offers received.  Patient chooses bed at  [facility])     Physician recommends and patient chooses bed at      Patient to be transferred to  [facility]) on 07/22/15.  Patient to be transferred to facility by CAR (her daughter [name])     Patient family notified on 07/22/15 of transfer.  Name of family member notified:  DAUGHTER     PHYSICIAN       Additional Comment: Pt / daughter are in agreement with  d/c to [facility] today. PT approved transport by car. NSG reviewed d/c summary, scripts,avs. Scripts included in d/c packet. D/C summary sent to SNF for review prior to d/c.   _______________________________________________ Luretha Rued, [place] 07/22/2015, 3:21 PM

## 2015-07-22 NOTE — Telephone Encounter (Signed)
Per AVS on 6/27:  Patient Instructions     Medication Instructions:  STOP PLAVIX  START ELIQUIS 2.5 MG TWICE DAILY  DILTIAZEM 30 MG EVERY 6 HOURS AS NEEDED   Spoke with daughter and she is aware this is an as needed medication

## 2015-07-22 NOTE — Progress Notes (Signed)
Pt denies need for tramadol,alert oriented x 4 at present.Vss cms+ x 4 Aggie Moats D

## 2015-07-23 ENCOUNTER — Telehealth: Payer: Self-pay | Admitting: *Deleted

## 2015-07-23 ENCOUNTER — Non-Acute Institutional Stay (SKILLED_NURSING_FACILITY): Payer: Medicare Other | Admitting: Adult Health

## 2015-07-23 ENCOUNTER — Encounter: Payer: Self-pay | Admitting: Adult Health

## 2015-07-23 DIAGNOSIS — K219 Gastro-esophageal reflux disease without esophagitis: Secondary | ICD-10-CM

## 2015-07-23 DIAGNOSIS — F329 Major depressive disorder, single episode, unspecified: Secondary | ICD-10-CM

## 2015-07-23 DIAGNOSIS — IMO0002 Reserved for concepts with insufficient information to code with codable children: Secondary | ICD-10-CM

## 2015-07-23 DIAGNOSIS — I10 Essential (primary) hypertension: Secondary | ICD-10-CM

## 2015-07-23 DIAGNOSIS — F0390 Unspecified dementia without behavioral disturbance: Secondary | ICD-10-CM | POA: Diagnosis not present

## 2015-07-23 DIAGNOSIS — E785 Hyperlipidemia, unspecified: Secondary | ICD-10-CM

## 2015-07-23 DIAGNOSIS — E1165 Type 2 diabetes mellitus with hyperglycemia: Secondary | ICD-10-CM

## 2015-07-23 DIAGNOSIS — M1712 Unilateral primary osteoarthritis, left knee: Secondary | ICD-10-CM

## 2015-07-23 DIAGNOSIS — I480 Paroxysmal atrial fibrillation: Secondary | ICD-10-CM | POA: Diagnosis not present

## 2015-07-23 DIAGNOSIS — E118 Type 2 diabetes mellitus with unspecified complications: Secondary | ICD-10-CM

## 2015-07-23 DIAGNOSIS — F32A Depression, unspecified: Secondary | ICD-10-CM

## 2015-07-23 DIAGNOSIS — E039 Hypothyroidism, unspecified: Secondary | ICD-10-CM

## 2015-07-23 NOTE — Progress Notes (Signed)
Patient ID: [name], female   DOB: [birth year], 81 y.o.   MRN: [id number]    DATE:  07/23/2015 MRN:  [id number]  BIRTHDAY: [birth year]  Facility:  Nursing Home Location:  [place] Room Number: 703-P  LEVEL OF CARE:  SNF [phone])  Contact Information    Name Relation Home Work [place] Daughter [phone]      Chief Complaint  Patient presents with  . Hospitalization Follow-up    Osteoarthritis S/P left total knee arthroplasty, diabetes mellitus, depression, hyperlipidemia, PAF, dementia, hypertension, hypothyroidism and GERD    HISTORY OF PRESENT ILLNESS:   This is an 81 year old female who has been admitted to [facility] on 07/22/15 from [facility]. She has PMH of hypothyroidism, diverticulosis, erosive esophagitis, peptic stricture of esophagus, diabetes mellitus, hypertension, hyperlipidemia,  PAF, CAD status post CABG X 5 in 2005, CVA and dementia. She has osteoarthritis for which she had left total knee arthroplasty on 07/19/15.  She has been admitted for a short-term rehabilitation.  PAST MEDICAL HISTORY:  Past Medical History  Diagnosis Date  . Sliding hiatal hernia   . Erosive esophagitis   . Peptic stricture of esophagus   . Diverticulosis   . Arthritis   . Hypothyroidism   . Diabetes mellitus     Type I  . Sick sinus syndrome     s/p Medtronic DDD pacemaker implanted; generator change 02/2012  . Hypertension   . Hyperlipidemia   . GERD (gastroesophageal reflux disease)   . Adenomatous colon polyp 07/2002  . Obesity   . Paroxysmal atrial fibrillation 02/04/15    discovered on PPM remote interrogation 6/16  . Coronary artery disease     s/p CABG 2001 x 5  . CVA (cerebral vascular accident) 2013 or 2014    Dr Evelena Leyden, Neurology  . Dementia   . History of swelling of feet     props up at night, circulations has been checked inlegs in past and was told it was ok     CURRENT  MEDICATIONS: Reviewed  Patient's Medications  New Prescriptions   No medications on file  Previous Medications   ACARBOSE (PRECOSE) 50 MG TABLET    TAKE 1 TABLET BY MOUTH 3 TIMES A DAY WITH MEALS   AMITRIPTYLINE (ELAVIL) 25 MG TABLET    TAKE 1 TABLET (25 MG TOTAL) BY MOUTH AT BEDTIME.   APIXABAN (ELIQUIS) 2.5 MG TABS TABLET    Take 1 tablet (2.5 mg total) by mouth 2 (two) times daily.   ATORVASTATIN (LIPITOR) 20 MG TABLET    TAKE 1 TABLET BY MOUTH EVERY DAY (IN PLACE OF PRAVASTATIN)   BROMOCRIPTINE (PARLODEL) 2.5 MG TABLET    TAKE 1 TABLET (2.5 MG TOTAL) BY MOUTH AT BEDTIME.   CALCIUM CARBONATE (CALCIUM 500 PO)    Take 500 mg by mouth daily.    COLESEVELAM (WELCHOL) 625 MG TABLET    Take 3 tablets (1,875 mg total) by mouth 2 (two) times daily before a meal.   DILTIAZEM (CARDIZEM) 30 MG TABLET    Take 1 tablet (30 mg total) by mouth every 6 (six) hours as needed.   DONEPEZIL (ARICEPT) 10 MG TABLET    Take 1 tablet (10 mg total) by mouth at bedtime.   ENALAPRIL (VASOTEC) 20 MG TABLET    TAKE 1 TABLET BY MOUTH DAILY   FISH OIL-OMEGA-3 FATTY ACIDS 1000 MG CAPSULE    Take 1 g by mouth daily.  HYDROMORPHONE (DILAUDID) 2 MG TABLET    Take 1 tablet (2 mg total) by mouth every 4 (four) hours as needed for severe pain.   INVOKANA 100 MG TABS TABLET    TAKE 1 TABLET BY MOUTH EVERY DAY   LEVOTHYROXINE (SYNTHROID, LEVOTHROID) 25 MCG TABLET    TAKE 1 AND 1/2 TABET BY MOUTH EVERY DAY   MEMANTINE (NAMENDA XR) 28 MG CP24 24 HR CAPSULE    Take 1 capsule (28 mg total) by mouth daily.   METOPROLOL (TOPROL-XL) 200 MG 24 HR TABLET    TAKE 1 TABLET BY MOUTH EVERY DAY   NAMENDA XR 28 MG CP24 24 HR CAPSULE    TAKE ONE CAPSULE EVERY DAY   NIACIN 500 MG TABLET    Take 500 mg by mouth every evening.    NITROGLYCERIN (NITROSTAT) 0.3 MG SL TABLET    Place 1 tablet (0.3 mg total) under the tongue every 5 (five) minutes as needed. For chest pain   PANTOPRAZOLE (PROTONIX) 40 MG TABLET    Take 1 tablet (40 mg total) by mouth  daily.   REPAGLINIDE (PRANDIN) 2 MG TABLET    TAKE 1 TABLET (2 MG TOTAL) BY MOUTH 3 (THREE) TIMES DAILY BEFORE MEALS.   SITAGLIPTIN (JANUVIA) 100 MG TABLET    1/2 tab daily   TRAMADOL (ULTRAM) 50 MG TABLET    Take 1-2 tablets (50-100 mg total) by mouth every 6 (six) hours as needed for moderate pain.  Modified Medications   No medications on file  Discontinued Medications   No medications on file     Allergies  Allergen Reactions  . Codeine Rash  . Robaxin [Methocarbamol] Other (See Comments)    Too strong      REVIEW OF SYSTEMS:  GENERAL: no change in appetite, no fatigue, no weight changes, no fever, chills or weakness EYES: Denies change in vision, dry eyes, eye pain, itching or discharge EARS: Denies change in hearing, ringing in ears, or earache NOSE: Denies nasal congestion or epistaxis MOUTH and THROAT: Denies oral discomfort, gingival pain or bleeding, pain from teeth or hoarseness   RESPIRATORY: no cough, SOB, DOE, wheezing, hemoptysis CARDIAC: no chest pain, edema or palpitations GI: no abdominal pain, diarrhea, constipation, heart burn, nausea or vomiting GU: Denies dysuria, frequency, hematuria, incontinence, or discharge PSYCHIATRIC: Denies feeling of depression or anxiety. No report of hallucinations, insomnia, paranoia, or agitation   PHYSICAL EXAMINATION  GENERAL APPEARANCE: Well nourished. In no acute distress. Obese SKIN:  Left knee surgical wound with aquacel dressing, dry, no erythema HEAD: Normal in size and contour. No evidence of trauma EYES: Lids open and close normally. No blepharitis, entropion or ectropion. PERRL. Conjunctivae are clear and sclerae are white. Lenses are without opacity EARS: Pinnae are normal. Patient hears normal voice tunes of the examiner MOUTH and THROAT: Lips are without lesions. Oral mucosa is moist and without lesions. Tongue is normal in shape, size, and color and without lesions NECK: supple, trachea midline, no neck  masses, no thyroid tenderness, no thyromegaly LYMPHATICS: no LAN in the neck, no supraclavicular LAN RESPIRATORY: breathing is even & unlabored, BS CTAB CARDIAC: RRR, no murmur,no extra heart sounds, BLE edema 1-2+, right chest pacemaker GI: abdomen soft, normal BS, no masses, no tenderness, no hepatomegaly, no splenomegaly EXTREMITIES:  Able to move 4 extremities PSYCHIATRIC: Alert and oriented X 3. Affect and behavior are appropriate  LABS/RADIOLOGY: Labs reviewed: Basic Metabolic Panel:  Recent Labs  07/15/15 1115 07/20/15 0440 07/21/15 0522  NA  137 135 134*  K 4.9 4.6 4.4  CL 105 100* 105  CO2 26 27 24   GLUCOSE 115* 159* 143*  BUN 35* 29* 37*  CREATININE 1.45* 1.34* 1.42*  CALCIUM 9.9 9.3 8.9   Liver Function Tests:  Recent Labs  07/15/15 1115  AST 26  ALT 33  ALKPHOS 32*  BILITOT 0.4  PROT 7.0  ALBUMIN 4.1   CBC:  Recent Labs  05/20/15 1305  07/15/15 1115 07/20/15 0440 07/21/15 0522 07/22/15 0503  WBC 7.8  < > 7.5 9.7 10.5 9.6  NEUTROABS 4.6  --  3.4  --   --   --   HGB 12.2  < > 12.1 11.5* 10.7* 10.7*  HCT 37.0  < > 36.9 34.3* 31.8* 32.4*  MCV 89.0  < > 90.7 88.6 89.6 90.5  PLT 179.0  < > 186 165 162 167  < > = values in this interval not displayed.  Lipid Panel:  Recent Labs  08/27/14 1656  HDL 46.50   CBG:  Recent Labs  07/21/15 2136 07/22/15 0724 07/22/15 1107  GLUCAP 168* 97 168*     Dg Chest 2 View  07/15/2015   CLINICAL DATA:  Preoperative clearance for left knee replacement  EXAM: CHEST  2 VIEW  COMPARISON:  03/12/2012  FINDINGS: Prior CABG. Right pacer remains in place, unchanged. Heart and mediastinal contours are within normal limits. No focal opacities or effusions. No acute bony abnormality.  IMPRESSION: No active cardiopulmonary disease.   Electronically Signed   By: Rolm Baptise M.D.   On: 07/15/2015 13:48    ASSESSMENT/PLAN:  Osteoarthritis S/P left total knee arthroplasty - for rehabilitation; LLE WBAT; continue  Eliquis 2.5 mg 1 tab by mouth twice a day for DVT prophylaxis; Dilaudid 2 mg 1 tab by mouth every 4 hours when necessary and tramadol 50 mg 1-2 tabs by mouth every 6 hours when necessary for pain; follow-up with Dr. Gladstone Lighter, orthopedic surgeon, in 2 weeks  Diabetes mellitus, type 2 - hemoglobin A1c 7.1; continue Acarbose 50 mg 1 tab by mouth 3 times a day with meals, Parlodel 2.5 mg 1 tab by mouth daily at bedtime, Januvia 100 mg  1/2 tab = 50 mg by mouth daily and Invokana 100 mg 1 tab by mouth daily  Depression - mood is stable; continue Elavil 25 mg 1 tab by mouth daily at bedtime  Hyperlipidemia - continue Lipitor 20 mg 1 tab by mouth daily and WelChol 625 mg 3 tabs = 1875 mg by mouth twice a day before meals  PAF - continue Cardizem 30 mg 1 tab by mouth every 6 hours when necessary and metoprolol 200 mg 24 hour 1 tab by mouth daily  Dementia - continue Aricept 10 mg 1 tab by mouth daily at bedtime and Namenda 28 mg 24 hour 1 capsule by mouth daily  Hypertension - well controlled; continue Visipaque 20 mg 1 tab by mouth daily, Cardizem 30 mg 1 tab by mouth every 6 hours when necessary and metoprolol 200 mg 24 hour 1 tab by mouth daily  Hypothyroidism - TSH 4.34; continue Synthroid 25 g 1 tab by mouth daily  GERD - continue Protonix 40 mg 1 tab by mouth daily    Goals of care:  Short-term rehabilitation    [facility], NP [facility] [phone]

## 2015-07-23 NOTE — Telephone Encounter (Signed)
Pt was on tcm List d/c 8/29 had (L) knee replacement will be f/u with Dr. Gladstone Lighter in 2 wks...[name]

## 2015-07-25 ENCOUNTER — Non-Acute Institutional Stay (SKILLED_NURSING_FACILITY): Payer: Medicare Other | Admitting: Internal Medicine

## 2015-07-25 ENCOUNTER — Encounter: Payer: Self-pay | Admitting: Internal Medicine

## 2015-07-25 DIAGNOSIS — F329 Major depressive disorder, single episode, unspecified: Secondary | ICD-10-CM | POA: Diagnosis not present

## 2015-07-25 DIAGNOSIS — E785 Hyperlipidemia, unspecified: Secondary | ICD-10-CM | POA: Diagnosis not present

## 2015-07-25 DIAGNOSIS — I10 Essential (primary) hypertension: Secondary | ICD-10-CM

## 2015-07-25 DIAGNOSIS — E118 Type 2 diabetes mellitus with unspecified complications: Secondary | ICD-10-CM

## 2015-07-25 DIAGNOSIS — IMO0002 Reserved for concepts with insufficient information to code with codable children: Secondary | ICD-10-CM

## 2015-07-25 DIAGNOSIS — I2510 Atherosclerotic heart disease of native coronary artery without angina pectoris: Secondary | ICD-10-CM | POA: Diagnosis not present

## 2015-07-25 DIAGNOSIS — D62 Acute posthemorrhagic anemia: Secondary | ICD-10-CM

## 2015-07-25 DIAGNOSIS — M1712 Unilateral primary osteoarthritis, left knee: Secondary | ICD-10-CM

## 2015-07-25 DIAGNOSIS — M62838 Other muscle spasm: Secondary | ICD-10-CM | POA: Diagnosis not present

## 2015-07-25 DIAGNOSIS — K219 Gastro-esophageal reflux disease without esophagitis: Secondary | ICD-10-CM | POA: Diagnosis not present

## 2015-07-25 DIAGNOSIS — F0390 Unspecified dementia without behavioral disturbance: Secondary | ICD-10-CM | POA: Diagnosis not present

## 2015-07-25 DIAGNOSIS — F32A Depression, unspecified: Secondary | ICD-10-CM

## 2015-07-25 DIAGNOSIS — E1165 Type 2 diabetes mellitus with hyperglycemia: Secondary | ICD-10-CM

## 2015-07-25 NOTE — Progress Notes (Signed)
Patient ID: [name], female   DOB: 1925-01-18, 81 y.o.   MRN: [id number]     [place]  PCP: Unice Cobble, MD  Code Status: DNR  Allergies  Allergen Reactions  . Codeine Rash  . Robaxin [Methocarbamol] Other (See Comments)    Too strong     Chief Complaint  Patient presents with  . New Admit To SNF    New Admission      HPI:  81 y.o. patient is here for short term rehabilitation post hospital admission from 07/19/15-07/22/15 with primary OA of left knee. She underwent left total knee arthroplasty. She is in pain at present but as per daughter does not remember to ask for pain medication. She was given tramadol and this made her very sleepy. Family does not want to get dilaudid. She also complaints of muscle tightness in both legs. She has been having increased frequency of bowel movement- 2 a day and she is used to having one a day. Her daughter and son in law are present in the room. No other concerns from the patient  Review of Systems:  Constitutional: Negative for fever, chills, diaphoresis.  HENT: Negative for headache, congestion, nasal discharge Eyes: Negative for eye pain, blurred vision, double vision and discharge.  Respiratory: Negative for cough, shortness of breath and wheezing.   Cardiovascular: Negative for chest pain, palpitations. Has chronic leg swelling Gastrointestinal: Negative for heartburn, nausea, vomiting, abdominal pain. Bowel movement this am Genitourinary: Negative for dysuria  Musculoskeletal: Negative for back pain, falls Skin: Negative for itching, rash.  Neurological: Negative for dizziness, tingling, focal weakness Psychiatric/Behavioral: Negative for depression   Past Medical History  Diagnosis Date  . Sliding hiatal hernia   . Erosive esophagitis   . Peptic stricture of esophagus   . Diverticulosis   . Arthritis   . Hypothyroidism   . Diabetes mellitus     Type I  . Sick sinus syndrome     s/p Medtronic DDD  pacemaker implanted; generator change 02/2012  . Hypertension   . Hyperlipidemia   . GERD (gastroesophageal reflux disease)   . Adenomatous colon polyp 07/2002  . Obesity   . Paroxysmal atrial fibrillation 02/04/15    discovered on PPM remote interrogation 6/16  . Coronary artery disease     s/p CABG 2001 x 5  . CVA (cerebral vascular accident) 2013 or 2014    Dr Evelena Leyden, Neurology  . Dementia   . History of swelling of feet     props up at night, circulations has been checked inlegs in past and was told it was ok   Past Surgical History  Procedure Laterality Date  . Pacemaker placement  03/25/2000    by Dr Olevia Perches  . Tonsillectomy and adenoidectomy    . Appendectomy    . Cholecystectomy    . Total abdominal hysterectomy w/ bilateral salpingoophorectomy      Endometiosis  . Replacement total knee  2004    Right  . Colonoscopy w/ polypectomy  2003  . Cataract extraction  05/2005  . Coronary artery bypass graft  02/2000    5 vessel  . Rotator cuff repair Right 2009  . Permanent pacemaker generator change  03/18/2012    PPM gen change by Dr Rayann Heman  . Total knee arthroplasty Left 07/19/2015    Procedure: LEFT TOTAL KNEE ARTHROPLASTY;  Surgeon: Latanya Maudlin, MD;  Location: [facility];  Service: Orthopedics;  Laterality: Left;   Social History:   reports  that she has quit smoking. She quit smokeless tobacco use about 33 years ago. She reports that she does not drink alcohol or use illicit drugs.  Family History  Problem Relation Age of Onset  . Colon cancer Mother 40  . Breast cancer Mother   . Transient ischemic attack Mother   . Cancer Mother   . Hypertension Mother   . Stroke Mother   . Diabetes Mother   . Colon cancer Brother 19  . Cancer Brother   . Hypertension Brother   . Heart attack Brother   . Breast cancer Sister   . Cancer Sister   . Diabetes Sister   . Prostate cancer Brother   . Heart attack Brother   . Heart disease      3 brothers had MI; 1 pre 35  .  Hypertension Sister   . Heart attack Daughter   . Diabetes Maternal Aunt     Medications:   Medication List       This list is accurate as of: 07/25/15  9:04 AM.  Always use your most recent med list.               acarbose 50 MG tablet  Commonly known as:  PRECOSE  TAKE 1 TABLET BY MOUTH 3 TIMES A DAY WITH MEALS     amitriptyline 25 MG tablet  Commonly known as:  ELAVIL  TAKE 1 TABLET (25 MG TOTAL) BY MOUTH AT BEDTIME.     apixaban 2.5 MG Tabs tablet  Commonly known as:  ELIQUIS  Take 1 tablet (2.5 mg total) by mouth 2 (two) times daily.     atorvastatin 20 MG tablet  Commonly known as:  LIPITOR  TAKE 1 TABLET BY MOUTH EVERY DAY (IN PLACE OF PRAVASTATIN)     bromocriptine 2.5 MG tablet  Commonly known as:  PARLODEL  TAKE 1 TABLET (2.5 MG TOTAL) BY MOUTH AT BEDTIME.     Calcium-Vitamin D 600-200 MG-UNIT per tablet  Take 1 tablet by mouth daily.     colesevelam 625 MG tablet  Commonly known as:  WELCHOL  Take 3 tablets (1,875 mg total) by mouth 2 (two) times daily before a meal.     diltiazem 30 MG tablet  Commonly known as:  CARDIZEM  Take 1 tablet (30 mg total) by mouth every 6 (six) hours as needed.     donepezil 10 MG tablet  Commonly known as:  ARICEPT  Take 1 tablet (10 mg total) by mouth at bedtime.     enalapril 20 MG tablet  Commonly known as:  VASOTEC  TAKE 1 TABLET BY MOUTH DAILY     fish oil-omega-3 fatty acids 1000 MG capsule  Take 1 g by mouth daily.     HYDROmorphone 2 MG tablet  Commonly known as:  DILAUDID  Take 1 tablet (2 mg total) by mouth every 4 (four) hours as needed for severe pain.     INVOKANA 100 MG Tabs tablet  Generic drug:  canagliflozin  TAKE 1 TABLET BY MOUTH EVERY DAY     JANUVIA 100 MG tablet  Generic drug:  sitaGLIPtin  Take 1 tablet (100 mg total) by mouth daily.     memantine 28 MG Cp24 24 hr capsule  Commonly known as:  NAMENDA XR  Take 1 capsule (28 mg total) by mouth daily.     metoprolol 200 MG 24 hr  tablet  Commonly known as:  TOPROL-XL  TAKE 1 TABLET BY MOUTH EVERY DAY  niacin 500 MG tablet  Take 500 mg by mouth every evening.     nitroGLYCERIN 0.3 MG SL tablet  Commonly known as:  NITROSTAT  Place 1 tablet (0.3 mg total) under the tongue every 5 (five) minutes as needed. For chest pain     pantoprazole 40 MG tablet  Commonly known as:  PROTONIX  Take 1 tablet (40 mg total) by mouth daily.     repaglinide 2 MG tablet  Commonly known as:  PRANDIN  TAKE 1 TABLET (2 MG TOTAL) BY MOUTH 3 (THREE) TIMES DAILY BEFORE MEALS.     traMADol 50 MG tablet  Commonly known as:  ULTRAM  Take 1-2 tablets (50-100 mg total) by mouth every 6 (six) hours as needed for moderate pain.     UNABLE TO FIND  Levothyroxine 37.5 mcg 1 by mouth daily for hypothyroid         Physical Exam: Filed Vitals:   07/25/15 0845  BP: 133/60  Pulse: 77  Temp: 97.8 F (36.6 C)  TempSrc: Oral  Resp: 20  Height: 5\' 5"  (1.651 m)  Weight: 208 lb (94.348 kg)  SpO2: 97%    General- elderly female, in no acute distress Head- normocephalic, atraumatic Nose- normal nasal mucosa, no maxillary or frontal sinus tenderness, no nasal discharge Throat- moist mucus membrane Neck- no cervical lymphadenopathy Cardiovascular- normal s1,s2, no murmurs, palpable dorsalis pedis and radial pulses, trace leg edema, pacemaker on right chest wall Respiratory- bilateral clear to auscultation, no wheeze, no rhonchi, no crackles, no use of accessory muscles, good UE strength Abdomen- bowel sounds present, soft, non tender Musculoskeletal- able to move all 4 extremities, left leg ROM limited  Neurological- no focal deficit, alert and oriented to person and place Skin- warm and dry, left knee aquacel dressing Psychiatry- normal mood and affect    Labs reviewed: Basic Metabolic Panel:  Recent Labs  07/15/15 1115 07/20/15 0440 07/21/15 0522  NA 137 135 134*  K 4.9 4.6 4.4  CL 105 100* 105  CO2 26 27 24   GLUCOSE  115* 159* 143*  BUN 35* 29* 37*  CREATININE 1.45* 1.34* 1.42*  CALCIUM 9.9 9.3 8.9   Liver Function Tests:  Recent Labs  07/15/15 1115  AST 26  ALT 33  ALKPHOS 32*  BILITOT 0.4  PROT 7.0  ALBUMIN 4.1   No results for input(s): LIPASE, AMYLASE in the last 8760 hours. No results for input(s): AMMONIA in the last 8760 hours. CBC:  Recent Labs  05/20/15 1305  07/15/15 1115 07/20/15 0440 07/21/15 0522 07/22/15 0503  WBC 7.8  < > 7.5 9.7 10.5 9.6  NEUTROABS 4.6  --  3.4  --   --   --   HGB 12.2  < > 12.1 11.5* 10.7* 10.7*  HCT 37.0  < > 36.9 34.3* 31.8* 32.4*  MCV 89.0  < > 90.7 88.6 89.6 90.5  PLT 179.0  < > 186 165 162 167  < > = values in this interval not displayed. Cardiac Enzymes: No results for input(s): CKTOTAL, CKMB, CKMBINDEX, TROPONINI in the last 8760 hours. BNP: Invalid input(s): POCBNP CBG:  Recent Labs  07/21/15 2136 07/22/15 0724 07/22/15 1107  GLUCAP 168* 97 168*    Radiological Exams: Dg Chest 2 View  07/15/2015   CLINICAL DATA:  Preoperative clearance for left knee replacement  EXAM: CHEST  2 VIEW  COMPARISON:  03/12/2012  FINDINGS: Prior CABG. Right pacer remains in place, unchanged. Heart and mediastinal contours are within normal limits.  No focal opacities or effusions. No acute bony abnormality.  IMPRESSION: No active cardiopulmonary disease.   Electronically Signed   By: Rolm Baptise M.D.   On: 07/15/2015 13:48     Assessment/Plan  Left knee Osteoarthritis  S/P left total knee arthroplasty. LLE WBAT. D/c her dilaudid and have her on tylenol 1000 mg tid with tramadol 50 mg 1-2 tab q6h prn pain. Start baclofen 5 mg qhs for muscle spasm and 5 mg q8h prn only for breakthrough.Will have her work with physical therapy and occupational therapy team to help with gait training and muscle strengthening exercises.fall precautions. Skin care. Encourage to be out of bed. Continue Eliquis 2.5 mg bid for DVT prophylaxis. Continue ca-vit d supplement. Has  f/u with orthopedics.   Muscle spasm Start baclofen 5 mg qhs with 5 mg q8h prn  Blood loss anemia Post op, monitor h&h  Diabetes mellitus type 2 a1c 7.1. Monitor cbg. continue Acarbose 50 mg tid, januvia 50 mg daily, Invokana 100 mg daily  HTN Stable, continue metoprolol 200 mg daily and enalapril 20 mg daily  CAD Remains chest pain free. Continue metoprolol, vasotec, statin, prn NTG  Dementia without behavioral disturbance Continue aricept 10 mg daily and namenda 28 mg daily for now  HLD Continue lipitor 20 mg daily with welchol bid and monitor  Chronic Depression  continue Elavil 25 mg daily  GERD  continue Protonix 40 mg daily   Goals of care: short term rehabilitation   Labs/tests ordered: cbc, bmp  Family/ staff Communication: reviewed care plan with patient and nursing supervisor    Blanchie Serve, MD  [facility] [phone] (Monday-Friday 8 am - 5 pm) [phone] (afterhours)

## 2015-07-26 ENCOUNTER — Non-Acute Institutional Stay (SKILLED_NURSING_FACILITY): Payer: Medicare Other | Admitting: Adult Health

## 2015-07-26 DIAGNOSIS — K219 Gastro-esophageal reflux disease without esophagitis: Secondary | ICD-10-CM | POA: Diagnosis not present

## 2015-07-26 DIAGNOSIS — E118 Type 2 diabetes mellitus with unspecified complications: Secondary | ICD-10-CM | POA: Diagnosis not present

## 2015-07-26 DIAGNOSIS — I10 Essential (primary) hypertension: Secondary | ICD-10-CM

## 2015-07-26 DIAGNOSIS — E1165 Type 2 diabetes mellitus with hyperglycemia: Secondary | ICD-10-CM

## 2015-07-26 DIAGNOSIS — I480 Paroxysmal atrial fibrillation: Secondary | ICD-10-CM

## 2015-07-26 DIAGNOSIS — M1712 Unilateral primary osteoarthritis, left knee: Secondary | ICD-10-CM | POA: Diagnosis not present

## 2015-07-26 DIAGNOSIS — F329 Major depressive disorder, single episode, unspecified: Secondary | ICD-10-CM | POA: Diagnosis not present

## 2015-07-26 DIAGNOSIS — E785 Hyperlipidemia, unspecified: Secondary | ICD-10-CM

## 2015-07-26 DIAGNOSIS — E039 Hypothyroidism, unspecified: Secondary | ICD-10-CM | POA: Diagnosis not present

## 2015-07-26 DIAGNOSIS — IMO0002 Reserved for concepts with insufficient information to code with codable children: Secondary | ICD-10-CM

## 2015-07-26 DIAGNOSIS — F32A Depression, unspecified: Secondary | ICD-10-CM

## 2015-07-26 DIAGNOSIS — F0390 Unspecified dementia without behavioral disturbance: Secondary | ICD-10-CM | POA: Diagnosis not present

## 2015-07-29 NOTE — Progress Notes (Signed)
Patient ID: [name], female   DOB: [birth year], 81 y.o.   MRN: [id number]    DATE: 07/26/15 MRN:  [id number]  BIRTHDAY: 07/03/1925  Facility:  Nursing Home Location:  [place] Room Number: 703-P  LEVEL OF CARE:  SNF [phone])  Contact Information    Name Relation Home Work Mobile   Simmons,[name] Daughter [phone]   No name specified          Chief Complaint  Patient presents with  . Discharge Note    Osteoarthritis S/P left total knee arthroplasty, diabetes mellitus, depression, hyperlipidemia, PAF, dementia, hypertension, hypothyroidism and GERD    HISTORY OF PRESENT ILLNESS:   This is an 81 year old female who is for discharge home and will have outpatient care. She has been admitted to [facility] on 07/22/15 from [facility]. She has PMH of hypothyroidism, diverticulosis, erosive esophagitis, peptic stricture of esophagus, diabetes mellitus, hypertension, hyperlipidemia,  PAF, CAD status post CABG X 5 in 2005, CVA and dementia. She has osteoarthritis for which she had left total knee arthroplasty on 07/19/15.  Patient was admitted to this facility for short-term rehabilitation after the patient's recent hospitalization.  Patient has completed SNF rehabilitation and therapy has cleared the patient for discharge.   PAST MEDICAL HISTORY:  Past Medical History  Diagnosis Date  . Sliding hiatal hernia   . Erosive esophagitis   . Peptic stricture of esophagus   . Diverticulosis   . Arthritis   . Hypothyroidism   . Diabetes mellitus     Type I  . Sick sinus syndrome     s/p Medtronic DDD pacemaker implanted; generator change 02/2012  . Hypertension   . Hyperlipidemia   . GERD (gastroesophageal reflux disease)   . Adenomatous colon polyp 07/2002  . Obesity   . Paroxysmal atrial fibrillation 02/04/15    discovered on PPM remote interrogation 6/16  . Coronary artery disease     s/p CABG 2001 x 5  . CVA  (cerebral vascular accident) 2013 or 2014    Dr Evelena Leyden, Neurology  . Dementia   . History of swelling of feet     props up at night, circulations has been checked inlegs in past and was told it was ok     CURRENT MEDICATIONS: Reviewed  Patient's Medications  New Prescriptions   No medications on file  Previous Medications   ACARBOSE (PRECOSE) 50 MG TABLET    TAKE 1 TABLET BY MOUTH 3 TIMES A DAY WITH MEALS   ACETAMINOPHEN (TYLENOL) 500 MG TABLET    Take 500 mg by mouth every 8 (eight) hours as needed. Take 2 tab = 1,000 mg PO Q 8 hours PRN   AMITRIPTYLINE (ELAVIL) 25 MG TABLET    TAKE 1 TABLET (25 MG TOTAL) BY MOUTH AT BEDTIME.   APIXABAN (ELIQUIS) 2.5 MG TABS TABLET    Take 1 tablet (2.5 mg total) by mouth 2 (two) times daily.   ATORVASTATIN (LIPITOR) 20 MG TABLET    TAKE 1 TABLET BY MOUTH EVERY DAY (IN PLACE OF PRAVASTATIN)   BROMOCRIPTINE (PARLODEL) 2.5 MG TABLET    TAKE 1 TABLET (2.5 MG TOTAL) BY MOUTH AT BEDTIME.   CALCIUM-VITAMIN D 600-200 MG-UNIT PER TABLET    Take 1 tablet by mouth daily.   COLESEVELAM (WELCHOL) 625 MG TABLET    Take 3 tablets (1,875 mg total) by mouth 2 (two) times daily before a meal.   DILTIAZEM (CARDIZEM) 30 MG TABLET  Take 1 tablet (30 mg total) by mouth every 6 (six) hours as needed.   DOCUSATE SODIUM (COLACE) 100 MG CAPSULE    Take 100 mg by mouth 2 (two) times daily.   DONEPEZIL (ARICEPT) 10 MG TABLET    Take 1 tablet (10 mg total) by mouth at bedtime.   ENALAPRIL (VASOTEC) 20 MG TABLET    TAKE 1 TABLET BY MOUTH DAILY   FISH OIL-OMEGA-3 FATTY ACIDS 1000 MG CAPSULE    Take 1 g by mouth daily.   INVOKANA 100 MG TABS TABLET    TAKE 1 TABLET BY MOUTH EVERY DAY   MEMANTINE (NAMENDA XR) 28 MG CP24 24 HR CAPSULE    Take 1 capsule (28 mg total) by mouth daily.   METOPROLOL (TOPROL-XL) 200 MG 24 HR TABLET    TAKE 1 TABLET BY MOUTH EVERY DAY   NIACIN 500 MG TABLET    Take 500 mg by mouth every evening.    NITROGLYCERIN (NITROSTAT) 0.3 MG SL TABLET    Place 1 tablet  (0.3 mg total) under the tongue every 5 (five) minutes as needed. For chest pain   PANTOPRAZOLE (PROTONIX) 40 MG TABLET    Take 1 tablet (40 mg total) by mouth daily.   REPAGLINIDE (PRANDIN) 2 MG TABLET    TAKE 1 TABLET (2 MG TOTAL) BY MOUTH 3 (THREE) TIMES DAILY BEFORE MEALS.   SITAGLIPTIN (JANUVIA) 100 MG TABLET    Take 1 tablet (100 mg total) by mouth daily.   TRAMADOL (ULTRAM) 50 MG TABLET    Take 1-2 tablets (50-100 mg total) by mouth every 6 (six) hours as needed for moderate pain.   UNABLE TO FIND    Levothyroxine 37.5 mcg 1 by mouth daily for hypothyroid  Modified Medications   No medications on file  Discontinued Medications   HYDROMORPHONE (DILAUDID) 2 MG TABLET    Take 1 tablet (2 mg total) by mouth every 4 (four) hours as needed for severe pain.     Allergies  Allergen Reactions  . Codeine Rash  . Robaxin [Methocarbamol] Other (See Comments)    Too strong      REVIEW OF SYSTEMS:  GENERAL: no change in appetite, no fatigue, no weight changes, no fever, chills or weakness EYES: Denies change in vision, dry eyes, eye pain, itching or discharge EARS: Denies change in hearing, ringing in ears, or earache NOSE: Denies nasal congestion or epistaxis MOUTH and THROAT: Denies oral discomfort, gingival pain or bleeding, pain from teeth or hoarseness   RESPIRATORY: no cough, SOB, DOE, wheezing, hemoptysis CARDIAC: no chest pain, edema or palpitations GI: no abdominal pain, diarrhea, constipation, heart burn, nausea or vomiting GU: Denies dysuria, frequency, hematuria, incontinence, or discharge PSYCHIATRIC: Denies feeling of depression or anxiety. No report of hallucinations, insomnia, paranoia, or agitation   PHYSICAL EXAMINATION  GENERAL APPEARANCE: Well nourished. In no acute distress. Obese SKIN:  Left knee surgical wound with aquacel dressing, dry, no erythema HEAD: Normal in size and contour. No evidence of trauma EYES: Lids open and close normally. No blepharitis,  entropion or ectropion. PERRL. Conjunctivae are clear and sclerae are white. Lenses are without opacity EARS: Pinnae are normal. Patient hears normal voice tunes of the examiner MOUTH and THROAT: Lips are without lesions. Oral mucosa is moist and without lesions. Tongue is normal in shape, size, and color and without lesions NECK: supple, trachea midline, no neck masses, no thyroid tenderness, no thyromegaly LYMPHATICS: no LAN in the neck, no supraclavicular LAN RESPIRATORY: breathing  is even & unlabored, BS CTAB CARDIAC: RRR, no murmur,no extra heart sounds, BLE edema 1+, right chest pacemaker GI: abdomen soft, normal BS, no masses, no tenderness, no hepatomegaly, no splenomegaly EXTREMITIES:  Able to move 4 extremities PSYCHIATRIC: Alert and oriented X 3. Affect and behavior are appropriate  LABS/RADIOLOGY: Labs reviewed: Basic Metabolic Panel:  Recent Labs  07/15/15 1115 07/20/15 0440 07/21/15 0522  NA 137 135 134*  K 4.9 4.6 4.4  CL 105 100* 105  CO2 26 27 24   GLUCOSE 115* 159* 143*  BUN 35* 29* 37*  CREATININE 1.45* 1.34* 1.42*  CALCIUM 9.9 9.3 8.9   Liver Function Tests:  Recent Labs  07/15/15 1115  AST 26  ALT 33  ALKPHOS 32*  BILITOT 0.4  PROT 7.0  ALBUMIN 4.1   CBC:  Recent Labs  05/20/15 1305  07/15/15 1115 07/20/15 0440 07/21/15 0522 07/22/15 0503  WBC 7.8  < > 7.5 9.7 10.5 9.6  NEUTROABS 4.6  --  3.4  --   --   --   HGB 12.2  < > 12.1 11.5* 10.7* 10.7*  HCT 37.0  < > 36.9 34.3* 31.8* 32.4*  MCV 89.0  < > 90.7 88.6 89.6 90.5  PLT 179.0  < > 186 165 162 167  < > = values in this interval not displayed.  Lipid Panel:  Recent Labs  08/27/14 1656  HDL 46.50   CBG:  Recent Labs  07/21/15 2136 07/22/15 0724 07/22/15 1107  GLUCAP 168* 97 168*     Dg Chest 2 View  07/15/2015   CLINICAL DATA:  Preoperative clearance for left knee replacement  EXAM: CHEST  2 VIEW  COMPARISON:  03/12/2012  FINDINGS: Prior CABG. Right pacer remains in  place, unchanged. Heart and mediastinal contours are within normal limits. No focal opacities or effusions. No acute bony abnormality.  IMPRESSION: No active cardiopulmonary disease.   Electronically Signed   By: Rolm Baptise M.D.   On: 07/15/2015 13:48    ASSESSMENT/PLAN:  Osteoarthritis S/P left total knee arthroplasty - for outpatient rehabilitation; LLE WBAT; continue Eliquis 2.5 mg 1 tab by mouth twice a day for DVT prophylaxis; Dilaudid 2 mg 1 tab by mouth every 4 hours when necessary and tramadol 50 mg 1-2 tabs by mouth every 6 hours when necessary for pain; follow-up with Dr. Gladstone Lighter, orthopedic surgeon  Diabetes mellitus, type 2 - hemoglobin A1c 7.1; continue Acarbose 50 mg 1 tab by mouth 3 times a day with meals, Parlodel 2.5 mg 1 tab by mouth daily at bedtime, Januvia 100 mg  1/2 tab = 50 mg by mouth daily and Invokana 100 mg 1 tab by mouth daily  Depression - mood is stable; continue Elavil 25 mg 1 tab by mouth daily at bedtime  Hyperlipidemia - continue Lipitor 20 mg 1 tab by mouth daily and WelChol 625 mg 3 tabs = 1875 mg by mouth twice a day before meals  PAF - continue Cardizem 30 mg 1 tab by mouth every 6 hours when necessary and metoprolol 200 mg 24 hour 1 tab by mouth daily  Dementia - continue Aricept 10 mg 1 tab by mouth daily at bedtime and Namenda 28 mg 24 hour 1 capsule by mouth daily  Hypertension - well controlled; continue Visipaque 20 mg 1 tab by mouth daily, Cardizem 30 mg 1 tab by mouth every 6 hours when necessary and metoprolol 200 mg 24 hour 1 tab by mouth daily  Hypothyroidism - TSH 4.34; continue Synthroid  25 g 1 tab by mouth daily  GERD - continue Protonix 40 mg 1 tab by mouth daily     I have filled out patient's discharge paperwork and written prescriptions.  Patient will have outpatient care.  Total discharge time: Less than 30 minutes  Discharge time involved coordination of the discharge process with Education officer, museum, nursing staff and therapy  department.      [facility], NP [employer] [phone]

## 2015-07-30 ENCOUNTER — Encounter: Payer: Self-pay | Admitting: *Deleted

## 2015-07-30 DIAGNOSIS — M1712 Unilateral primary osteoarthritis, left knee: Secondary | ICD-10-CM | POA: Diagnosis not present

## 2015-07-31 ENCOUNTER — Other Ambulatory Visit: Payer: Self-pay | Admitting: Internal Medicine

## 2015-08-01 DIAGNOSIS — M1712 Unilateral primary osteoarthritis, left knee: Secondary | ICD-10-CM | POA: Diagnosis not present

## 2015-08-02 ENCOUNTER — Telehealth: Payer: Self-pay | Admitting: *Deleted

## 2015-08-02 NOTE — Telephone Encounter (Signed)
Faxed Patient Information for Your Consideration form states pt has been prescribed Amitriptyline by Dr. Valentina Lucks and another doctor has prescribed Metoprolol Succinate and there my be an increase in depression in this pt.

## 2015-08-02 NOTE — Telephone Encounter (Signed)
Would not recommend medicine for her at this time

## 2015-08-05 ENCOUNTER — Telehealth: Payer: Self-pay | Admitting: Internal Medicine

## 2015-08-05 DIAGNOSIS — M1712 Unilateral primary osteoarthritis, left knee: Secondary | ICD-10-CM | POA: Diagnosis not present

## 2015-08-05 NOTE — Telephone Encounter (Signed)
Patient's Daughter [name], [place], wanted to confirm her mother's appointment. Appointment confirmed.

## 2015-08-05 NOTE — Telephone Encounter (Signed)
New Message  Pt daughter calling to speak w/ RN about pt's current condition, and to get information concerning appt on 10/10 w/ Dr Rayann Heman. Please call back and discuss.

## 2015-08-07 DIAGNOSIS — M1712 Unilateral primary osteoarthritis, left knee: Secondary | ICD-10-CM | POA: Diagnosis not present

## 2015-08-08 ENCOUNTER — Other Ambulatory Visit: Payer: Self-pay | Admitting: *Deleted

## 2015-08-08 ENCOUNTER — Other Ambulatory Visit: Payer: Self-pay | Admitting: Internal Medicine

## 2015-08-08 MED ORDER — METOPROLOL SUCCINATE ER 200 MG PO TB24: 200.0000 mg | ORAL_TABLET | Freq: Every day | ORAL | Status: DC

## 2015-08-09 DIAGNOSIS — M1712 Unilateral primary osteoarthritis, left knee: Secondary | ICD-10-CM | POA: Diagnosis not present

## 2015-08-12 DIAGNOSIS — M1712 Unilateral primary osteoarthritis, left knee: Secondary | ICD-10-CM | POA: Diagnosis not present

## 2015-08-14 DIAGNOSIS — M1712 Unilateral primary osteoarthritis, left knee: Secondary | ICD-10-CM | POA: Diagnosis not present

## 2015-08-16 DIAGNOSIS — M1712 Unilateral primary osteoarthritis, left knee: Secondary | ICD-10-CM | POA: Diagnosis not present

## 2015-08-19 ENCOUNTER — Ambulatory Visit: Payer: Medicare Other | Admitting: Nurse Practitioner

## 2015-08-19 DIAGNOSIS — M1712 Unilateral primary osteoarthritis, left knee: Secondary | ICD-10-CM | POA: Diagnosis not present

## 2015-08-21 DIAGNOSIS — M1712 Unilateral primary osteoarthritis, left knee: Secondary | ICD-10-CM | POA: Diagnosis not present

## 2015-08-22 ENCOUNTER — Other Ambulatory Visit: Payer: Self-pay | Admitting: Internal Medicine

## 2015-08-23 DIAGNOSIS — Z471 Aftercare following joint replacement surgery: Secondary | ICD-10-CM | POA: Diagnosis not present

## 2015-08-23 DIAGNOSIS — Z96652 Presence of left artificial knee joint: Secondary | ICD-10-CM | POA: Diagnosis not present

## 2015-08-23 DIAGNOSIS — M1712 Unilateral primary osteoarthritis, left knee: Secondary | ICD-10-CM | POA: Diagnosis not present

## 2015-08-24 ENCOUNTER — Other Ambulatory Visit: Payer: Self-pay | Admitting: Endocrinology

## 2015-08-26 DIAGNOSIS — M1712 Unilateral primary osteoarthritis, left knee: Secondary | ICD-10-CM | POA: Diagnosis not present

## 2015-08-28 DIAGNOSIS — M1712 Unilateral primary osteoarthritis, left knee: Secondary | ICD-10-CM | POA: Diagnosis not present

## 2015-08-30 DIAGNOSIS — M1712 Unilateral primary osteoarthritis, left knee: Secondary | ICD-10-CM | POA: Diagnosis not present

## 2015-09-02 ENCOUNTER — Encounter: Payer: Self-pay | Admitting: Internal Medicine

## 2015-09-02 ENCOUNTER — Ambulatory Visit (INDEPENDENT_AMBULATORY_CARE_PROVIDER_SITE_OTHER): Payer: Medicare Other | Admitting: Internal Medicine

## 2015-09-02 VITALS — BP 142/86 | HR 70 | Ht 65.5 in | Wt 201.6 lb

## 2015-09-02 DIAGNOSIS — I495 Sick sinus syndrome: Secondary | ICD-10-CM | POA: Diagnosis not present

## 2015-09-02 DIAGNOSIS — I10 Essential (primary) hypertension: Secondary | ICD-10-CM | POA: Diagnosis not present

## 2015-09-02 DIAGNOSIS — I2510 Atherosclerotic heart disease of native coronary artery without angina pectoris: Secondary | ICD-10-CM

## 2015-09-02 DIAGNOSIS — Z950 Presence of cardiac pacemaker: Secondary | ICD-10-CM | POA: Diagnosis not present

## 2015-09-02 LAB — CUP PACEART INCLINIC DEVICE CHECK
Brady Statistic AP VP Percent: 1 %
Brady Statistic AS VP Percent: 0 %
Brady Statistic AS VS Percent: 3 %
Lead Channel Pacing Threshold Amplitude: 0.75 V
Lead Channel Pacing Threshold Amplitude: 1 V
Lead Channel Pacing Threshold Pulse Width: 0.4 ms
Lead Channel Sensing Intrinsic Amplitude: 15.67 mV
Lead Channel Setting Pacing Pulse Width: 0.4 ms
MDC IDC MSMT BATTERY IMPEDANCE: 206 Ohm
MDC IDC MSMT BATTERY REMAINING LONGEVITY: 117 mo
MDC IDC MSMT BATTERY VOLTAGE: 2.78 V
MDC IDC MSMT LEADCHNL RA IMPEDANCE VALUE: 410 Ohm
MDC IDC MSMT LEADCHNL RV IMPEDANCE VALUE: 800 Ohm
MDC IDC MSMT LEADCHNL RV PACING THRESHOLD PULSEWIDTH: 0.4 ms
MDC IDC SESS DTM: 20161010143247
MDC IDC SET LEADCHNL RA PACING AMPLITUDE: 2 V
MDC IDC SET LEADCHNL RV PACING AMPLITUDE: 2.5 V
MDC IDC SET LEADCHNL RV SENSING SENSITIVITY: 5.6 mV
MDC IDC STAT BRADY AP VS PERCENT: 96 %

## 2015-09-02 NOTE — Progress Notes (Signed)
PCP:  Unice Cobble, MD  The patient presents today for electrophysiology followup.  Since last being seen in our clinic, she has done reasonalby well.  She is recovering from recent L knee surgery.  She is otherwise at her baseline per her daughter who is with her today.  Chronic SOB.   Today, she denies symptoms of palpitations, chest pain,  presyncope, syncope, or neurologic sequela.  + chronic edema The patient feels that she is tolerating medications without difficulties and is otherwise without complaint today.   Past Medical History  Diagnosis Date  . Sliding hiatal hernia   . Erosive esophagitis   . Peptic stricture of esophagus   . Diverticulosis   . Arthritis   . Hypothyroidism   . Diabetes mellitus     Type I  . Sick sinus syndrome [facility])     s/p Medtronic DDD pacemaker implanted; generator change 02/2012  . Hypertension   . Hyperlipidemia   . GERD (gastroesophageal reflux disease)   . Adenomatous colon polyp 07/2002  . Obesity   . Paroxysmal atrial fibrillation ([place]) 02/04/15    discovered on PPM remote interrogation 6/16  . Coronary artery disease     s/p CABG 2001 x 5  . CVA (cerebral vascular accident) [facility]) 2013 or 2014    Dr Evelena Leyden, Neurology  . Dementia   . History of swelling of feet     props up at night, circulations has been checked inlegs in past and was told it was ok   Past Surgical History  Procedure Laterality Date  . Pacemaker placement  03/25/2000    by Dr Olevia Perches  . Tonsillectomy and adenoidectomy    . Appendectomy    . Cholecystectomy    . Total abdominal hysterectomy w/ bilateral salpingoophorectomy      Endometiosis  . Replacement total knee  2004    Right  . Colonoscopy w/ polypectomy  2003  . Cataract extraction  05/2005  . Coronary artery bypass graft  02/2000    5 vessel  . Rotator cuff repair Right 2009  . Permanent pacemaker generator change  03/18/2012    PPM gen change by Dr Rayann Heman  . Total knee arthroplasty Left 07/19/2015    Procedure:  LEFT TOTAL KNEE ARTHROPLASTY;  Surgeon: Latanya Maudlin, MD;  Location: [facility] ORS;  Service: Orthopedics;  Laterality: Left;    Current Outpatient Prescriptions  Medication Sig Dispense Refill  . acarbose (PRECOSE) 50 MG tablet TAKE 1 TABLET BY MOUTH 3 TIMES A DAY WITH MEALS 90 tablet 11  . acetaminophen (TYLENOL) 500 MG tablet Take 500 mg by mouth every 8 (eight) hours as needed. Take 2 tab = 1,000 mg PO Q 8 hours PRN    . amitriptyline (ELAVIL) 25 MG tablet TAKE 1 TABLET (25 MG TOTAL) BY MOUTH AT BEDTIME. 90 tablet 2  . apixaban (ELIQUIS) 2.5 MG TABS tablet Take 1 tablet (2.5 mg total) by mouth 2 (two) times daily. 60 tablet 11  . atorvastatin (LIPITOR) 20 MG tablet TAKE 1 TABLET BY MOUTH EVERY DAY (IN PLACE OF PRAVASTATIN) 30 tablet 2  . bromocriptine (PARLODEL) 2.5 MG tablet TAKE 1 TABLET (2.5 MG TOTAL) BY MOUTH AT BEDTIME. 30 tablet 10  . Calcium-Vitamin D 600-200 MG-UNIT per tablet Take 1 tablet by mouth daily.    . colesevelam (WELCHOL) 625 MG tablet Take 3 tablets (1,875 mg total) by mouth 2 (two) times daily before a meal.    . diltiazem (CARDIZEM) 30 MG tablet Take 1 tablet (30  mg total) by mouth every 6 (six) hours as needed. 120 tablet 3  . docusate sodium (COLACE) 100 MG capsule Take 100 mg by mouth 2 (two) times daily.    Marland Kitchen donepezil (ARICEPT) 10 MG tablet Take 1 tablet (10 mg total) by mouth at bedtime. 90 tablet 3  . enalapril (VASOTEC) 20 MG tablet TAKE 1 TABLET BY MOUTH DAILY 90 tablet 1  . enalapril (VASOTEC) 20 MG tablet TAKE 1 TABLET BY MOUTH DAILY 90 tablet 0  . fish oil-omega-3 fatty acids 1000 MG capsule Take 1 g by mouth daily.    . INVOKANA 100 MG TABS tablet TAKE 1 TABLET BY MOUTH EVERY DAY 90 tablet 2  . levothyroxine (SYNTHROID, LEVOTHROID) 25 MCG tablet Take 25 mcg by mouth daily before breakfast. Take 1 1/2 tablets daily    . memantine (NAMENDA XR) 28 MG CP24 24 hr capsule Take 1 capsule (28 mg total) by mouth daily. 90 capsule 3  . metoprolol (TOPROL-XL) 200 MG 24 hr  tablet Take 1 tablet (200 mg total) by mouth daily. 30 tablet 0  . niacin 500 MG tablet Take 500 mg by mouth every evening.     . nitroGLYCERIN (NITROSTAT) 0.3 MG SL tablet Place 1 tablet (0.3 mg total) under the tongue every 5 (five) minutes as needed. For chest pain 90 tablet 3  . pantoprazole (PROTONIX) 40 MG tablet Take 1 tablet (40 mg total) by mouth daily. 90 tablet 1  . repaglinide (PRANDIN) 2 MG tablet TAKE 1 TABLET (2 MG TOTAL) BY MOUTH 3 (THREE) TIMES DAILY BEFORE MEALS. 90 tablet 11  . sitaGLIPtin (JANUVIA) 100 MG tablet Take 100 mg by mouth daily. Take half tablet daily    . traMADol (ULTRAM) 50 MG tablet Take 1-2 tablets (50-100 mg total) by mouth every 6 (six) hours as needed for moderate pain. 80 tablet 0   No current facility-administered medications for this visit.   Facility-Administered Medications Ordered in Other Visits  Medication Dose Route Frequency Provider Last Rate Last Dose  . bupivacaine liposome (EXPAREL) 1.3 % injection 266 mg  20 mL Infiltration Once [employer], PA-C        Allergies  Allergen Reactions  . Codeine Rash  . Robaxin [Methocarbamol] Other (See Comments)    Too strong     Social History   Social History  . Marital Status: Widowed    Spouse Name: N/A  . Number of Children: 1  . Years of Education: N/A   Occupational History  . Retired    Social History Main Topics  . Smoking status: Former Research scientist (life sciences)  . Smokeless tobacco: Former Systems developer    Quit date: 11/23/1981     Comment: quit 28+ yrs ago (as of 2012)  . Alcohol Use: No  . Drug Use: No  . Sexual Activity: No   Other Topics Concern  . Not on file   Social History Narrative   Patient lives at home alone. Patient's daughter was with her [name] ) [phone]   Retired.   Education college   Right handed.   Caffeine one cup of coffee daily.   .    Family History  Problem Relation Age of Onset  . Colon cancer Mother 39  . Breast cancer Mother   . Transient ischemic  attack Mother   . Cancer Mother   . Hypertension Mother   . Stroke Mother   . Diabetes Mother   . Colon cancer Brother 54  . Cancer Brother   .  Hypertension Brother   . Heart attack Brother   . Breast cancer Sister   . Cancer Sister   . Diabetes Sister   . Prostate cancer Brother   . Heart attack Brother   . Heart disease      3 brothers had MI; 1 pre 97  . Hypertension Sister   . Heart attack Daughter   . Diabetes Maternal Aunt     Physical Exam: Filed Vitals:   09/02/15 1252  BP: 142/86  Pulse: 70  Height: 5' 5.5" (1.664 m)  Weight: 201 lb 9.6 oz (91.445 kg)    GEN- The patient is elderly and overweight appearing, alert and oriented x 3 today.   Head- normocephalic, atraumatic Eyes-  Sclera clear, conjunctiva pink Ears- hearing intact Oropharynx- clear Neck- supple,  Lungs- Clear to ausculation bilaterally, normal work of breathing Chest- pacemaker pocket is well healed Heart- Regular rate and rhythm, no murmurs, rubs or gallops, PMI not laterally displaced GI- soft, NT, ND, + BS Extremities- no clubbing, cyanosis, +1 edema  Pacemaker interrogation- reviewed in detail today,  See PACEART report  Assessment and Plan:   1. Sick sinus syndrome Normal pacemaker function See Pace Art report No changes today  2. CAD No ischemic symptoms  3. HTN Stable No change required today  Compliant with remote monitoring Return to see EP NP in 1 year  Thompson Grayer MD, [facility] 09/02/2015 1:07 PM

## 2015-09-02 NOTE — Patient Instructions (Signed)
Medication Instructions:  Your physician recommends that you continue on your current medications as directed. Please refer to the Current Medication list given to you today.  Labwork: None ordered  Testing/Procedures: None ordered  Follow-Up: Remote monitoring is used to monitor your Pacemaker of ICD from home. This monitoring reduces the number of office visits required to check your device to one time per year. It allows [place] to keep an eye on the functioning of your device to ensure it is working properly. You are scheduled for a device check from home on 12/02/2015. You may send your transmission at any time that day. If you have a wireless device, the transmission will be sent automatically. After your physician reviews your transmission, you will receive a postcard with your next transmission date.  Your physician wants you to follow-up in: 1 year with Chanetta Marshall, NP.  You will receive a reminder letter in the mail two months in advance. If you don't receive a letter, please call our office to schedule the follow-up appointment.  Any Other Special Instructions Will Be Listed Below (If Applicable). Thank you for choosing [facility]!!

## 2015-09-03 DIAGNOSIS — M1712 Unilateral primary osteoarthritis, left knee: Secondary | ICD-10-CM | POA: Diagnosis not present

## 2015-09-05 ENCOUNTER — Ambulatory Visit: Payer: Medicare Other | Admitting: Podiatry

## 2015-09-07 ENCOUNTER — Other Ambulatory Visit: Payer: Self-pay | Admitting: Adult Health

## 2015-09-09 ENCOUNTER — Emergency Department (HOSPITAL_BASED_OUTPATIENT_CLINIC_OR_DEPARTMENT_OTHER)
Admission: EM | Admit: 2015-09-09 | Discharge: 2015-09-09 | Disposition: A | Discharge status: Home / Self Care | Payer: Medicare Other | Attending: Emergency Medicine | Admitting: Emergency Medicine

## 2015-09-09 ENCOUNTER — Encounter (HOSPITAL_BASED_OUTPATIENT_CLINIC_OR_DEPARTMENT_OTHER): Payer: Self-pay

## 2015-09-09 ENCOUNTER — Emergency Department (HOSPITAL_BASED_OUTPATIENT_CLINIC_OR_DEPARTMENT_OTHER): Payer: Medicare Other

## 2015-09-09 DIAGNOSIS — E109 Type 1 diabetes mellitus without complications: Secondary | ICD-10-CM | POA: Diagnosis not present

## 2015-09-09 DIAGNOSIS — N390 Urinary tract infection, site not specified: Secondary | ICD-10-CM | POA: Insufficient documentation

## 2015-09-09 DIAGNOSIS — I2510 Atherosclerotic heart disease of native coronary artery without angina pectoris: Secondary | ICD-10-CM | POA: Diagnosis not present

## 2015-09-09 DIAGNOSIS — E669 Obesity, unspecified: Secondary | ICD-10-CM | POA: Diagnosis not present

## 2015-09-09 DIAGNOSIS — Z86018 Personal history of other benign neoplasm: Secondary | ICD-10-CM | POA: Diagnosis not present

## 2015-09-09 DIAGNOSIS — E039 Hypothyroidism, unspecified: Secondary | ICD-10-CM | POA: Insufficient documentation

## 2015-09-09 DIAGNOSIS — Z79899 Other long term (current) drug therapy: Secondary | ICD-10-CM | POA: Diagnosis not present

## 2015-09-09 DIAGNOSIS — I10 Essential (primary) hypertension: Secondary | ICD-10-CM | POA: Insufficient documentation

## 2015-09-09 DIAGNOSIS — H8111 Benign paroxysmal vertigo, right ear: Secondary | ICD-10-CM | POA: Insufficient documentation

## 2015-09-09 DIAGNOSIS — Z951 Presence of aortocoronary bypass graft: Secondary | ICD-10-CM | POA: Insufficient documentation

## 2015-09-09 DIAGNOSIS — R42 Dizziness and giddiness: Secondary | ICD-10-CM | POA: Diagnosis present

## 2015-09-09 DIAGNOSIS — R0602 Shortness of breath: Secondary | ICD-10-CM | POA: Insufficient documentation

## 2015-09-09 DIAGNOSIS — S20219A Contusion of unspecified front wall of thorax, initial encounter: Secondary | ICD-10-CM | POA: Diagnosis not present

## 2015-09-09 DIAGNOSIS — Z96652 Presence of left artificial knee joint: Secondary | ICD-10-CM | POA: Insufficient documentation

## 2015-09-09 DIAGNOSIS — Z87891 Personal history of nicotine dependence: Secondary | ICD-10-CM | POA: Insufficient documentation

## 2015-09-09 DIAGNOSIS — F0390 Unspecified dementia without behavioral disturbance: Secondary | ICD-10-CM | POA: Insufficient documentation

## 2015-09-09 DIAGNOSIS — Z862 Personal history of diseases of the blood and blood-forming organs and certain disorders involving the immune mechanism: Secondary | ICD-10-CM | POA: Insufficient documentation

## 2015-09-09 DIAGNOSIS — K210 Gastro-esophageal reflux disease with esophagitis: Secondary | ICD-10-CM | POA: Insufficient documentation

## 2015-09-09 DIAGNOSIS — Z950 Presence of cardiac pacemaker: Secondary | ICD-10-CM | POA: Insufficient documentation

## 2015-09-09 DIAGNOSIS — Z8739 Personal history of other diseases of the musculoskeletal system and connective tissue: Secondary | ICD-10-CM | POA: Diagnosis not present

## 2015-09-09 LAB — CBC
HCT: 37 % (ref 36.0–46.0)
Hemoglobin: 11.8 g/dL — ABNORMAL LOW (ref 12.0–15.0)
MCH: 29.1 pg (ref 26.0–34.0)
MCHC: 31.9 g/dL (ref 30.0–36.0)
MCV: 91.4 fL (ref 78.0–100.0)
PLATELETS: 190 10*3/uL (ref 150–400)
RBC: 4.05 MIL/uL (ref 3.87–5.11)
RDW: 13.8 % (ref 11.5–15.5)
WBC: 6.8 10*3/uL (ref 4.0–10.5)

## 2015-09-09 LAB — DIFFERENTIAL
BASOS ABS: 0 10*3/uL (ref 0.0–0.1)
BASOS PCT: 0 %
EOS ABS: 0.2 10*3/uL (ref 0.0–0.7)
EOS PCT: 3 %
LYMPHS ABS: 1.8 10*3/uL (ref 0.7–4.0)
Lymphocytes Relative: 26 %
Monocytes Absolute: 0.6 10*3/uL (ref 0.1–1.0)
Monocytes Relative: 9 %
NEUTROS PCT: 62 %
Neutro Abs: 4.2 10*3/uL (ref 1.7–7.7)

## 2015-09-09 LAB — URINALYSIS, ROUTINE W REFLEX MICROSCOPIC
BILIRUBIN URINE: NEGATIVE
GLUCOSE, UA: 500 mg/dL — AB
Ketones, ur: NEGATIVE mg/dL
Nitrite: POSITIVE — AB
Protein, ur: NEGATIVE mg/dL
SPECIFIC GRAVITY, URINE: 1.01 (ref 1.005–1.030)
Urobilinogen, UA: 0.2 mg/dL (ref 0.0–1.0)
pH: 5 (ref 5.0–8.0)

## 2015-09-09 LAB — COMPREHENSIVE METABOLIC PANEL
ALBUMIN: 3.9 g/dL (ref 3.5–5.0)
ALT: 20 U/L (ref 14–54)
ANION GAP: 7 (ref 5–15)
AST: 29 U/L (ref 15–41)
Alkaline Phosphatase: 47 U/L (ref 38–126)
BILIRUBIN TOTAL: 0.5 mg/dL (ref 0.3–1.2)
BUN: 25 mg/dL — ABNORMAL HIGH (ref 6–20)
CHLORIDE: 108 mmol/L (ref 101–111)
CO2: 24 mmol/L (ref 22–32)
Calcium: 9.5 mg/dL (ref 8.9–10.3)
Creatinine, Ser: 1.41 mg/dL — ABNORMAL HIGH (ref 0.44–1.00)
GFR calc Af Amer: 38 mL/min — ABNORMAL LOW (ref >60)
GFR calc non Af Amer: 32 mL/min — ABNORMAL LOW (ref >60)
GLUCOSE: 171 mg/dL — AB (ref 65–99)
POTASSIUM: 4.5 mmol/L (ref 3.5–5.1)
Sodium: 139 mmol/L (ref 135–145)
TOTAL PROTEIN: 7 g/dL (ref 6.5–8.1)

## 2015-09-09 LAB — URINE MICROSCOPIC-ADD ON

## 2015-09-09 LAB — RAPID URINE DRUG SCREEN, HOSP PERFORMED
AMPHETAMINES: NOT DETECTED
BENZODIAZEPINES: NOT DETECTED
Barbiturates: NOT DETECTED
Cocaine: NOT DETECTED
OPIATES: NOT DETECTED
TETRAHYDROCANNABINOL: NOT DETECTED

## 2015-09-09 LAB — APTT: APTT: 32 s (ref 24–37)

## 2015-09-09 LAB — TROPONIN I: Troponin I: <0.03 ng/mL (ref <0.031)

## 2015-09-09 LAB — ETHANOL

## 2015-09-09 LAB — BRAIN NATRIURETIC PEPTIDE: B Natriuretic Peptide: 110.5 pg/mL — ABNORMAL HIGH (ref 0.0–100.0)

## 2015-09-09 LAB — PROTIME-INR
INR: 1.29 (ref 0.00–1.49)
PROTHROMBIN TIME: 16.2 s — AB (ref 11.6–15.2)

## 2015-09-09 MED ORDER — MECLIZINE HCL 25 MG PO TABS: 25.0000 mg | ORAL_TABLET | Freq: Three times a day (TID) | ORAL | Status: DC | PRN

## 2015-09-09 MED ORDER — MECLIZINE HCL 25 MG PO TABS
25.0000 mg | ORAL_TABLET | Freq: Once | ORAL | Status: AC
  Administered 2015-09-09: 25 mg via ORAL
  Filled 2015-09-09: qty 1

## 2015-09-09 MED ORDER — CEPHALEXIN 250 MG PO CAPS
500.0000 mg | ORAL_CAPSULE | Freq: Once | ORAL | Status: AC
  Administered 2015-09-09: 500 mg via ORAL
  Filled 2015-09-09: qty 2

## 2015-09-09 MED ORDER — CEPHALEXIN 250 MG PO CAPS: 250.0000 mg | ORAL_CAPSULE | Freq: Once | ORAL | Status: DC

## 2015-09-09 MED ORDER — CEPHALEXIN 500 MG PO CAPS: 500.0000 mg | ORAL_CAPSULE | Freq: Three times a day (TID) | ORAL | Status: DC

## 2015-09-09 NOTE — ED Notes (Signed)
Patient transported to X-ray 

## 2015-09-09 NOTE — ED Provider Notes (Signed)
CSN: [id number]     Arrival date & time 09/09/15  1252 History   First MD Initiated Contact with Patient 09/09/15 1310     Chief Complaint  Patient presents with  . Dizziness     (Consider location/radiation/quality/duration/timing/severity/associated sxs/prior Treatment) HPI Comments: Patient is an 81 year old female with hypertension, hyperlipidemia, sick sinus syndrome, paroxysmal atrial fibrillation, coronary artery disease, prior CVA, dementia, diabetes with recent knee replacement approximately 8 weeks ago who is currently staying with her daughter and presents today do to dizziness. She describes the dizziness as an imbalance feeling well a spinning sensation that is worse when she attempts to get up and walk. Daughter states symptoms started last night right after she had got up to walk her bedroom. Symptoms are much worse when attempting to walk and better when lying still.  Patient denies any unilateral weakness, numbness, speech disturbance, swallowing difficulty. She does complain of occasional dysuria but nothing consistently. No chest pain, palpitations, abdominal pain, nausea or vomiting. Daughter states since yesterday she has seemed more winded with ambulation but patient feels that she is at her baseline. Denies any recent lower extremity edema. Patient does have a history of vertigo that she used to take meclizine for but states that is not a problem for some time. She states this feels similar. She also has a history of stroke.  She is currently still taking her Eliquis and denies any recent change in medications except that she stopped all narcotic medications. Blood sugar today was 112.  Patient is a 81 y.o. female presenting with dizziness. The history is provided by the patient and a caregiver.  Dizziness Quality:  Imbalance and head spinning Severity:  Moderate Onset quality:  Gradual Duration:  12 hours Timing:  Constant Progression:  Waxing and waning Chronicity:   Recurrent Context: head movement and standing up   Context: not with ear pain and not with loss of consciousness   Relieved by:  Being still Worsened by:  Eye movement, standing up and turning head Ineffective treatments:  None tried Associated symptoms: shortness of breath and weakness   Associated symptoms: no chest pain, no diarrhea, no headaches, no nausea, no palpitations, no tinnitus, no vision changes and no vomiting   Risk factors: heart disease, hx of stroke, hx of vertigo and multiple medications   Risk factors: no new medications     Past Medical History  Diagnosis Date  . Sliding hiatal hernia   . Erosive esophagitis   . Peptic stricture of esophagus   . Diverticulosis   . Arthritis   . Hypothyroidism   . Diabetes mellitus     Type I  . Sick sinus syndrome [facility])     s/p Medtronic DDD pacemaker implanted; generator change 02/2012  . Hypertension   . Hyperlipidemia   . GERD (gastroesophageal reflux disease)   . Adenomatous colon polyp 07/2002  . Obesity   . Paroxysmal atrial fibrillation ([place]) 02/04/15    discovered on PPM remote interrogation 6/16  . Coronary artery disease     s/p CABG 2001 x 5  . CVA (cerebral vascular accident) [facility]) 2013 or 2014    Dr Evelena Leyden, Neurology  . Dementia   . History of swelling of feet     props up at night, circulations has been checked inlegs in past and was told it was ok   Past Surgical History  Procedure Laterality Date  . Pacemaker placement  03/25/2000    by Dr Olevia Perches  . Tonsillectomy and adenoidectomy    .  Appendectomy    . Cholecystectomy    . Total abdominal hysterectomy w/ bilateral salpingoophorectomy      Endometiosis  . Replacement total knee  2004    Right  . Colonoscopy w/ polypectomy  2003  . Cataract extraction  05/2005  . Coronary artery bypass graft  02/2000    5 vessel  . Rotator cuff repair Right 2009  . Permanent pacemaker generator change  03/18/2012    PPM gen change by Dr Rayann Heman  . Total knee  arthroplasty Left 07/19/2015    Procedure: LEFT TOTAL KNEE ARTHROPLASTY;  Surgeon: Latanya Maudlin, MD;  Location: [facility] ORS;  Service: Orthopedics;  Laterality: Left;   Family History  Problem Relation Age of Onset  . Colon cancer Mother 17  . Breast cancer Mother   . Transient ischemic attack Mother   . Cancer Mother   . Hypertension Mother   . Stroke Mother   . Diabetes Mother   . Colon cancer Brother 62  . Cancer Brother   . Hypertension Brother   . Heart attack Brother   . Breast cancer Sister   . Cancer Sister   . Diabetes Sister   . Prostate cancer Brother   . Heart attack Brother   . Heart disease      3 brothers had MI; 1 pre 37  . Hypertension Sister   . Heart attack Daughter   . Diabetes Maternal Aunt    Social History  Substance Use Topics  . Smoking status: Former Research scientist (life sciences)  . Smokeless tobacco: Former Systems developer    Quit date: 11/23/1981     Comment: quit 28+ yrs ago (as of 2012)  . Alcohol Use: No   OB History    No data available     Review of Systems  HENT: Negative for tinnitus.   Respiratory: Positive for shortness of breath.   Cardiovascular: Negative for chest pain and palpitations.  Gastrointestinal: Negative for nausea, vomiting and diarrhea.  Musculoskeletal:       Knee replacement 8 weeks ago but healing well  Neurological: Positive for dizziness and weakness. Negative for headaches.  Psychiatric/Behavioral: Positive for confusion.       Daughter feels a little more confused the last 12 hours.  Does not feel like she is confused now  All other systems reviewed and are negative.     Allergies  Codeine and Robaxin  Home Medications   Prior to Admission medications   Medication Sig Start Date End Date Taking? Authorizing Provider  acarbose (PRECOSE) 50 MG tablet TAKE 1 TABLET BY MOUTH 3 TIMES A DAY WITH MEALS 02/11/15   Renato Shin, MD  acetaminophen (TYLENOL) 500 MG tablet Take 500 mg by mouth every 8 (eight) hours as needed. Take 2 tab = 1,000 mg  PO Q 8 hours PRN    Historical Provider, MD  amitriptyline (ELAVIL) 25 MG tablet TAKE 1 TABLET (25 MG TOTAL) BY MOUTH AT BEDTIME. 11/02/14   Bronson Ing, DPM  apixaban (ELIQUIS) 2.5 MG TABS tablet Take 1 tablet (2.5 mg total) by mouth 2 (two) times daily. 05/20/15   Amber Sena Slate, NP  atorvastatin (LIPITOR) 20 MG tablet TAKE 1 TABLET BY MOUTH EVERY DAY (IN PLACE OF PRAVASTATIN) 06/06/15   Hendricks Limes, MD  bromocriptine (PARLODEL) 2.5 MG tablet TAKE 1 TABLET (2.5 MG TOTAL) BY MOUTH AT BEDTIME. 06/12/15   Renato Shin, MD  Calcium-Vitamin D 600-200 MG-UNIT per tablet Take 1 tablet by mouth daily.    Historical Provider,  MD  colesevelam (WELCHOL) 625 MG tablet Take 3 tablets (1,875 mg total) by mouth 2 (two) times daily before a meal. 07/25/15   Blanchie Serve, MD  diltiazem (CARDIZEM) 30 MG tablet Take 1 tablet (30 mg total) by mouth every 6 (six) hours as needed. 05/20/15   Amber Sena Slate, NP  docusate sodium (COLACE) 100 MG capsule Take 100 mg by mouth 2 (two) times daily.    Historical Provider, MD  donepezil (ARICEPT) 10 MG tablet Take 1 tablet (10 mg total) by mouth at bedtime. 04/08/15   Dennie Bible, NP  enalapril (VASOTEC) 20 MG tablet TAKE 1 TABLET BY MOUTH DAILY 07/31/15   Thompson Grayer, MD  enalapril (VASOTEC) 20 MG tablet TAKE 1 TABLET BY MOUTH DAILY 08/22/15   Thompson Grayer, MD  fish oil-omega-3 fatty acids 1000 MG capsule Take 1 g by mouth daily.    Historical Provider, MD  INVOKANA 100 MG TABS tablet TAKE 1 TABLET BY MOUTH EVERY DAY 08/26/15   Renato Shin, MD  levothyroxine (SYNTHROID, LEVOTHROID) 25 MCG tablet Take 25 mcg by mouth daily before breakfast. Take 1 1/2 tablets daily    Historical Provider, MD  memantine (NAMENDA XR) 28 MG CP24 24 hr capsule Take 1 capsule (28 mg total) by mouth daily. 04/08/15   Dennie Bible, NP  metoprolol (TOPROL-XL) 200 MG 24 hr tablet Take 1 tablet (200 mg total) by mouth daily. 08/08/15   Thompson Grayer, MD  niacin 500 MG tablet Take 500 mg  by mouth every evening.     Historical Provider, MD  nitroGLYCERIN (NITROSTAT) 0.3 MG SL tablet Place 1 tablet (0.3 mg total) under the tongue every 5 (five) minutes as needed. For chest pain 07/16/14   Thompson Grayer, MD  pantoprazole (PROTONIX) 40 MG tablet Take 1 tablet (40 mg total) by mouth daily. 05/06/15   Hendricks Limes, MD  repaglinide (PRANDIN) 2 MG tablet TAKE 1 TABLET (2 MG TOTAL) BY MOUTH 3 (THREE) TIMES DAILY BEFORE MEALS. 01/16/15   Renato Shin, MD  sitaGLIPtin (JANUVIA) 100 MG tablet Take 100 mg by mouth daily. Take half tablet daily 07/25/15   Blanchie Serve, MD  traMADol (ULTRAM) 50 MG tablet Take 1-2 tablets (50-100 mg total) by mouth every 6 (six) hours as needed for moderate pain. 07/20/15   Amber Constable, PA-C   BP 130/53 mmHg  Pulse 77  Temp(Src) 97.8 F (36.6 C) (Oral)  Resp 17  Ht 5\' 7"  (1.702 m)  Wt 201 lb (91.173 kg)  BMI 31.47 kg/m2  SpO2 96% Physical Exam  Constitutional: She is oriented to person, place, and time. She appears well-developed and well-nourished. No distress.  HENT:  Head: Normocephalic and atraumatic.  Mouth/Throat: Oropharynx is clear and moist.  Eyes: Conjunctivae and EOM are normal. Pupils are equal, round, and reactive to light. Right eye exhibits no discharge. Left eye exhibits no discharge.  No nystagmus but symptoms reproduced when looking to the right and moving the head to right.    Neck: Normal range of motion. Neck supple. No spinous process tenderness present. No rigidity. No Brudzinski's sign and no Kernig's sign noted.  Cardiovascular: Normal rate, normal heart sounds and intact distal pulses.   No murmur heard. Pacemaker present  Pulmonary/Chest: Effort normal and breath sounds normal. No respiratory distress. She has no wheezes. She has no rales.  Abdominal: Soft. She exhibits no distension. There is no tenderness.  Musculoskeletal: Normal range of motion. She exhibits edema. She exhibits no tenderness.  Healing  left knee surgical  scar  Lymphadenopathy:    She has no cervical adenopathy.  Neurological: She is alert and oriented to person, place, and time. She has normal strength. No cranial nerve deficit or sensory deficit. Coordination and gait normal. GCS eye subscore is 4. GCS verbal subscore is 5. GCS motor subscore is 6.  Finger to nose is slow but able to achieve with accuracy.  No visual field cuts or aphasia.  Sensation is normal bilaterally and 5/5 strength in bilateral upper and lower ext.  No pronator drift in upper or lower ext.  Unable to preform heel to shin due to recent knee replacement.  Skin: Skin is warm and dry.  Psychiatric: She has a normal mood and affect. Her behavior is normal.  Nursing note and vitals reviewed.   ED Course  Procedures (including critical care time) Labs Review Labs Reviewed  PROTIME-INR - Abnormal; Notable for the following:    Prothrombin Time 16.2 (*)    All other components within normal limits  CBC - Abnormal; Notable for the following:    Hemoglobin 11.8 (*)    All other components within normal limits  COMPREHENSIVE METABOLIC PANEL - Abnormal; Notable for the following:    Glucose, Bld 171 (*)    BUN 25 (*)    Creatinine, Ser 1.41 (*)    GFR calc non Af Amer 32 (*)    GFR calc Af Amer 38 (*)    All other components within normal limits  URINALYSIS, ROUTINE W REFLEX MICROSCOPIC (NOT AT [facility]) - Abnormal; Notable for the following:    APPearance TURBID (*)    Glucose, UA 500 (*)    Hgb urine dipstick SMALL (*)    Nitrite POSITIVE (*)    Leukocytes, UA LARGE (*)    All other components within normal limits  BRAIN NATRIURETIC PEPTIDE - Abnormal; Notable for the following:    B Natriuretic Peptide 110.5 (*)    All other components within normal limits  URINE MICROSCOPIC-ADD ON - Abnormal; Notable for the following:    Squamous Epithelial / LPF FEW (*)    Bacteria, UA MANY (*)    All other components within normal limits  ETHANOL  APTT  DIFFERENTIAL  URINE  RAPID DRUG SCREEN, HOSP PERFORMED  TROPONIN I    Imaging Review Dg Chest 2 View  09/09/2015  CLINICAL DATA:  Confusion EXAM: CHEST  2 VIEW COMPARISON:  07/15/2015 chest radiograph FINDINGS: Median sternotomy wires, CABG clips and dual lead right subclavian pacemaker are stable in configuration. Stable cardiomediastinal silhouette with mild cardiomegaly. No pneumothorax. No pleural effusion. Clear lungs, with no focal lung consolidation and no pulmonary edema. IMPRESSION: No active cardiopulmonary disease. Electronically Signed   By: Ilona Sorrel M.D.   On: 09/09/2015 14:26   Ct Head Wo Contrast  09/09/2015  CLINICAL DATA:  Dizziness since yesterday.  Increased confusion. EXAM: CT HEAD WITHOUT CONTRAST TECHNIQUE: Contiguous axial images were obtained from the base of the skull through the vertex without intravenous contrast. COMPARISON:  Head CT 04/20/2012 FINDINGS: No acute intracranial hemorrhage. No focal mass lesion. No CT evidence of acute infarction. No midline shift or mass effect. No hydrocephalus. Basilar cisterns are patent. There is generalized cortical atrophy unchanged. Periventricular white matter and hypodensities again demonstrated. Extra-axial 7 mm calcific density along the RIGHT frontal bone unchanged from prior likely represents small meningioma. Stable opacification mastoid air cells pre. IMPRESSION: 1. No acute intracranial findings. 2. Stable atrophy and microvascular disease. 3. Chronic fluid  in the mastoid air cells. Electronically Signed   By: Suzy Bouchard M.D.   On: 09/09/2015 14:16   I have personally reviewed and evaluated these images and lab results as part of my medical decision-making.   EKG Interpretation   Date/Time:  Monday September 09 2015 13:45:20 EDT Ventricular Rate:  66 PR Interval:  222 QRS Duration: 86 QT Interval:  392 QTC Calculation: 410 R Axis:   34 Text Interpretation:  new  Atrial-paced rhythm with prolonged AV  conduction Confirmed by  Maryan Rued  MD, Loree Fee ([id number]) on 09/09/2015  1:52:56 PM      MDM   Final diagnoses:  None    Patient is an 81 year old female with multiple medical problems who recently had knee surgery 8 weeks ago and last night developed dizziness that is worsened today. She denies any near-syncope or syncopal symptoms seems that she describes her dizziness more like vertigo. The moving sensation when she attempts to get up and walk or moves her head. Also daughter felt like she may of been more confused last night but she does have a history of dementia. They have stopped all narcotic medications without any other med changes. Daughter states that she appeared to have more difficulty getting around today compared to earlier this week. They attempted to take her to therapy however because of her symptoms they recommended she come here because her doctor was not in the office. Patient denies headache, head trauma, unilateral weakness numbness, aphasia, swallowing difficulty. Daughter denies any facial droop or syncope.  On exam patient's symptoms are reproduced by looking to the right and moving her head to the right. She gets a terrible spinning sensation and closes her eyes and grabs the bed. Recent surgical site is healing well without evidence of lower extremity edema or concerns for CHF. She denies any chest pain or palpitations and EKG shows an atrial paced rhythm. Labs are without acute findings and CT of the head to evaluate her stroke is negative for acute findings. Patient is unable to receive an MRI due to pacemaker placement. She does take eliquis.  And on exam no focal neurologic deficits.  Patient does have a history of vertigo in the past and states she took meclizine for years but hasn't needed any for the last several years.  Patient given meclizine here with improvement of her dizziness. Code stroke labs are without acute findings and chest x-ray, BMP and troponin are all normal limits.  Feel  most likely patient's symptoms are related to benign positional vertigo. UA pending and will ambulate the patient. If she is able to ambulate and symptoms are improved feel that is reasonable that she be discharged home with family to follow up with Dr. Linna Darner if symptoms do not improve. Stressed with family that we cannot 100% rule out stroke but her symptoms are more consistent with BPH. Also patient cannot have an MRI for definitive diagnosis.  4:23 PM Patient ambulated in the hall with her walker without assistance. She states only slight dizziness at this time but no ataxia present. UA is also consistent with UTI with too numerous to count white blood cells as well as nitrate and leukocyte positive. Will treat with Keflex and culture pending this also is most likely the cause of her recent intermittent confusion  Blanchie Dessert, MD 09/09/15 1624

## 2015-09-09 NOTE — ED Notes (Signed)
Patient ambulates to RN station and back to bed without difficulty or assistance, MD at bedside.

## 2015-09-09 NOTE — ED Notes (Addendum)
MD at bedside for assessment

## 2015-09-09 NOTE — ED Notes (Signed)
Dizzy episode with increase in confusion last night and change in gait today-pt is post op knee replacement 8 weeks ago-PT would not see her today-pt in w/c and alert-daughter giving report

## 2015-09-09 NOTE — ED Notes (Signed)
Patient transported to CT 

## 2015-09-09 NOTE — ED Notes (Signed)
MD at bedside. 

## 2015-09-09 NOTE — Discharge Instructions (Signed)
Benign Positional Vertigo Vertigo is the feeling that you or your surroundings are moving when they are not. Benign positional vertigo is the most common form of vertigo. The cause of this condition is not serious (is benign). This condition is triggered by certain movements and positions (is positional). This condition can be dangerous if it occurs while you are doing something that could endanger you or others, such as driving.  CAUSES In many cases, the cause of this condition is not known. It may be caused by a disturbance in an area of the inner ear that helps your brain to sense movement and balance. This disturbance can be caused by a viral infection (labyrinthitis), head injury, or repetitive motion. RISK FACTORS This condition is more likely to develop in:  Women.  People who are 50 years of age or older. SYMPTOMS Symptoms of this condition usually happen when you move your head or your eyes in different directions. Symptoms may start suddenly, and they usually last for less than a minute. Symptoms may include:  Loss of balance and falling.  Feeling like you are spinning or moving.  Feeling like your surroundings are spinning or moving.  Nausea and vomiting.  Blurred vision.  Dizziness.  Involuntary eye movement (nystagmus). Symptoms can be mild and cause only slight annoyance, or they can be severe and interfere with daily life. Episodes of benign positional vertigo may return (recur) over time, and they may be triggered by certain movements. Symptoms may improve over time. DIAGNOSIS This condition is usually diagnosed by medical history and a physical exam of the head, neck, and ears. You may be referred to a health care provider who specializes in ear, nose, and throat (ENT) problems (otolaryngologist) or a provider who specializes in disorders of the nervous system (neurologist). You may have additional testing, including:  MRI.  A CT scan.  Eye movement tests. Your  health care provider may ask you to change positions quickly while he or she watches you for symptoms of benign positional vertigo, such as nystagmus. Eye movement may be tested with an electronystagmogram (ENG), caloric stimulation, the Dix-Hallpike test, or the roll test.  An electroencephalogram (EEG). This records electrical activity in your brain.  Hearing tests. TREATMENT Usually, your health care provider will treat this by moving your head in specific positions to adjust your inner ear back to normal. Surgery may be needed in severe cases, but this is rare. In some cases, benign positional vertigo may resolve on its own in 2-4 weeks. HOME CARE INSTRUCTIONS Safety  Move slowly.Avoid sudden body or head movements.  Avoid driving.  Avoid operating heavy machinery.  Avoid doing any tasks that would be dangerous to you or others if a vertigo episode would occur.  If you have trouble walking or keeping your balance, try using a cane for stability. If you feel dizzy or unstable, sit down right away.  Return to your normal activities as told by your health care provider. Ask your health care provider what activities are safe for you. General Instructions  Take over-the-counter and prescription medicines only as told by your health care provider.  Avoid certain positions or movements as told by your health care provider.  Drink enough fluid to keep your urine clear or pale yellow.  Keep all follow-up visits as told by your health care provider. This is important. SEEK MEDICAL CARE IF:  You have a fever.  Your condition gets worse or you develop new symptoms.  Your family or friends   notice any behavioral changes.  Your nausea or vomiting gets worse.  You have numbness or a "pins and needles" sensation. SEEK IMMEDIATE MEDICAL CARE IF:  You have difficulty speaking or moving.  You are always dizzy.  You faint.  You develop severe headaches.  You have weakness in your  legs or arms.  You have changes in your hearing or vision.  You develop a stiff neck.  You develop sensitivity to light.   This information is not intended to replace advice given to you by your health care provider. Make sure you discuss any questions you have with your health care provider.   Document Released: 08/17/2006 Document Revised: 07/31/2015 Document Reviewed: 03/04/2015 Elsevier Interactive Patient Education 2016 [employer]  

## 2015-09-09 NOTE — ED Notes (Signed)
Patient preparing for discharge. 

## 2015-09-12 LAB — URINE CULTURE: Culture: >=100000

## 2015-09-13 ENCOUNTER — Telehealth (HOSPITAL_COMMUNITY): Payer: Self-pay

## 2015-09-13 NOTE — Telephone Encounter (Signed)
Post ED Visit - Positive Culture Follow-up  Culture report reviewed by antimicrobial stewardship pharmacist:  []  Heide Guile, Pharm.D., BCPS []  Alycia Rossetti, Pharm.D., BCPS []  [place], Pharm.D., BCPS, AAHIVP []  Legrand Como, Pharm.D., BCPS, AAHIVP []  [place], Pharm.D. []  Cassie Nicole Kindred, [place].D. Earnestine Mealing, Pharm.D.  Positive urine culture, >/= 100,000 colonies -> E Coli Treated with Cephalexin, organism sensitive to the same and no further patient follow-up is required at this time   Dortha Kern 09/13/2015, 12:50 PM

## 2015-09-16 DIAGNOSIS — Z23 Encounter for immunization: Secondary | ICD-10-CM | POA: Diagnosis not present

## 2015-09-17 ENCOUNTER — Other Ambulatory Visit: Payer: Self-pay | Admitting: Internal Medicine

## 2015-09-20 DIAGNOSIS — Z87440 Personal history of urinary (tract) infections: Secondary | ICD-10-CM | POA: Diagnosis not present

## 2015-09-20 DIAGNOSIS — N390 Urinary tract infection, site not specified: Secondary | ICD-10-CM | POA: Diagnosis not present

## 2015-09-30 DIAGNOSIS — Z961 Presence of intraocular lens: Secondary | ICD-10-CM | POA: Diagnosis not present

## 2015-09-30 DIAGNOSIS — E119 Type 2 diabetes mellitus without complications: Secondary | ICD-10-CM | POA: Diagnosis not present

## 2015-09-30 LAB — HM DIABETES EYE EXAM

## 2015-10-03 ENCOUNTER — Encounter: Payer: Self-pay | Admitting: Internal Medicine

## 2015-10-07 DIAGNOSIS — Z96652 Presence of left artificial knee joint: Secondary | ICD-10-CM | POA: Diagnosis not present

## 2015-10-07 DIAGNOSIS — Z471 Aftercare following joint replacement surgery: Secondary | ICD-10-CM | POA: Diagnosis not present

## 2015-10-15 ENCOUNTER — Encounter: Payer: Self-pay | Admitting: Physical Therapy

## 2015-10-15 ENCOUNTER — Ambulatory Visit: Payer: Medicare Other | Attending: Orthopedic Surgery | Admitting: Physical Therapy

## 2015-10-15 DIAGNOSIS — M25662 Stiffness of left knee, not elsewhere classified: Secondary | ICD-10-CM

## 2015-10-15 DIAGNOSIS — M7989 Other specified soft tissue disorders: Secondary | ICD-10-CM | POA: Diagnosis not present

## 2015-10-15 DIAGNOSIS — R29898 Other symptoms and signs involving the musculoskeletal system: Secondary | ICD-10-CM

## 2015-10-15 DIAGNOSIS — R269 Unspecified abnormalities of gait and mobility: Secondary | ICD-10-CM | POA: Insufficient documentation

## 2015-10-15 NOTE — Therapy (Signed)
[facility] [address], [place], [place], [address] Phone: [phone]   Fax:  [phone]  Physical Therapy Evaluation  Patient Details  Name: [name] MRN: [id number] Date of Birth: 06-Apr-1925 Referring Provider: Dr. Latanya Maudlin  Encounter Date: 10/15/2015      PT End of Session - 10/15/15 1129    Visit Number 1   Number of Visits 10  medicare   Date for PT Re-Evaluation 12/10/15   PT Start Time 1100   PT Stop Time 1145   PT Time Calculation (min) 45 min   Activity Tolerance Patient tolerated treatment well   Behavior During Therapy [facility] for tasks assessed/performed      Past Medical History  Diagnosis Date  . Sliding hiatal hernia   . Erosive esophagitis   . Peptic stricture of esophagus   . Diverticulosis   . Arthritis   . Hypothyroidism   . Diabetes mellitus     Type I  . Sick sinus syndrome [facility])     s/p Medtronic DDD pacemaker implanted; generator change 02/2012  . Hypertension   . Hyperlipidemia   . GERD (gastroesophageal reflux disease)   . Adenomatous colon polyp 07/2002  . Obesity   . Paroxysmal atrial fibrillation ([place]) 02/04/15    discovered on PPM remote interrogation 6/16  . Coronary artery disease     s/p CABG 2001 x 5  . CVA (cerebral vascular accident) [facility]) 2013 or 2014    Dr Evelena Leyden, Neurology  . Dementia   . History of swelling of feet     props up at night, circulations has been checked inlegs in past and was told it was ok    Past Surgical History  Procedure Laterality Date  . Pacemaker placement  03/25/2000    by Dr Olevia Perches  . Tonsillectomy and adenoidectomy    . Appendectomy    . Cholecystectomy    . Total abdominal hysterectomy w/ bilateral salpingoophorectomy      Endometiosis  . Replacement total knee  2004    Right  . Colonoscopy w/ polypectomy  2003  . Cataract extraction  05/2005  . Coronary artery bypass graft  02/2000    5 vessel  . Rotator cuff repair  Right 2009  . Permanent pacemaker generator change  03/18/2012    PPM gen change by Dr Rayann Heman  . Total knee arthroplasty Left 07/19/2015    Procedure: LEFT TOTAL KNEE ARTHROPLASTY;  Surgeon: Latanya Maudlin, MD;  [facility];  Service: Orthopedics;  Laterality: Left;    There were no vitals filed for this visit.  Visit Diagnosis:  Knee stiffness, left - Plan: PT plan of care cert/re-cert  Swelling of limb - Plan: PT plan of care cert/re-cert  Abnormality of gait - Plan: PT plan of care cert/re-cert  Weakness of left lower extremity - Plan: PT plan of care cert/re-cert      Subjective Assessment - 10/15/15 1107    Subjective Patient reports left knee replacement 07/18/2015.  Patient was in hospital for 3 days.  Patient was in [facility] for 1 week then went to her daughter. Patient has not been home since 07/18/2015.    Patient is accompained by: Family member  daughter   Pertinent History Rt TKA, CVA   Limitations Standing   How long can you stand comfortably? difficult   How long can you walk comfortably? needs a rolling walker   Patient Stated Goals increase left knee flexion   Currently  in Pain? Yes   Pain Score 2    Pain Location Knee   Pain Orientation Left   Pain Descriptors / Indicators Sore   Pain Type Acute pain   Pain Frequency Intermittent   Aggravating Factors  movement of left knee   Pain Relieving Factors rest, no movement   Multiple Pain Sites No            [facility] PT Assessment - 10/15/15 0001    Assessment   Medical Diagnosis Z96.652 S/P total left knee replacement   Referring Provider Dr. Latanya Maudlin   Onset Date/Surgical Date 07/18/15   Precautions   Precautions ICD/Pacemaker   Precaution Comments Pace maker precautions   Restrictions   Weight Bearing Restrictions No   Balance Screen   Has the patient fallen in the past 6 months Yes  walking around recliner and fell onto floor   How many times? 1   Has the patient had a decrease in  activity level because of a fear of falling?  No   Is the patient reluctant to leave their home because of a fear of falling?  No   Prior Function   Level of Independence Independent with household mobility with device   Vocation Retired   Associate Professor   Overall Cognitive Status Within Functional Limits for tasks assessed   Observation/Other Assessments   Focus on Therapeutic Outcomes (FOTO)  85% limitation CM  goal is 61% limitation CL   Observation/Other Assessments-Edema    Edema Circumferential   Circumferential Edema   Circumferential - Left  52.8 cm   ROM / Strength   AROM / PROM / Strength AROM;PROM;Strength   AROM   AROM Assessment Site Knee   Right/Left Knee Left   Left Knee Extension -18   Left Knee Flexion 79  105 right   PROM   PROM Assessment Site Knee   Right/Left Knee Left   Left Knee Extension -10   Left Knee Flexion 90  110 right   Strength   Strength Assessment Site Knee   Right/Left Knee Left   Left Knee Flexion 3+/5   Left Knee Extension 3+/5   Palpation   Patella mobility decreased mobility   Palpation comment surgical site is healed but decreased mobility   Ambulation/Gait   Ambulation/Gait Yes   Ambulation/Gait Assistance 6: Modified independent (Device/Increase time)   Assistive device Rolling walker   Gait Pattern Decreased step length - left;Decreased hip/knee flexion - left;Decreased dorsiflexion - left   Stairs Yes   Stair Management Technique Step to pattern;One rail Right                           PT Education - 10/15/15 1129    Education provided No          PT Short Term Goals - 10/15/15 1138    PT SHORT TERM GOAL #1   Time 4   Period Weeks   Status New   PT SHORT TERM GOAL #2   Title left knee PROM >/= 100 degrees   Time 4   Period Weeks   Status New   PT SHORT TERM GOAL #3   Title left knee strength 4-/5 so she can walk safely with walker   Time 4   Period Weeks   Status New           PT Long  Term Goals - 10/15/15 1139    PT LONG TERM GOAL #1  Title be independent in advanced HEP   Time 8   Period Weeks   Status New   PT LONG TERM GOAL #2   Title FOTO score is </= 61% limitation   Time 8   Period Weeks   Status New   PT LONG TERM GOAL #3   Title Left knee flexion AROM >/= 105 degree to make it easier to walk   Time 8   Period Weeks   Status New   PT LONG TERM GOAL #4   Title left knee strength 4+/5 so patient is able to return home to live   Time 8   Period Weeks   Status New   PT LONG TERM GOAL #5   Title pain decreased >/= 50%    Time 8   Period Weeks   Status New               Plan - 11-06-2015 1130    Clinical Impression Statement Patient is a 81 year old female with diagnosis of s/p left total knee replacement on 07/18/2015. Patient was in the hospital for 3 days then [facility] place for 1 week.  Patient is now at her daughters until she is able to live on her own.  Patient reports pain is 2/10  in left knee  intemittently with movement.  Patient ambulates with a rolling walker with decrease lef tknee flexion and extension.  Patient needs to use her hands to stand up . Patient walks very slowly and had difficulty due to spinal stenosis.  Paitent has a pasce maker therefore needs pacemaker precautions.  Left knee AROM/PROM in degrees: flexion 79/90 and extension -18/-10.  Left knee strength is 3+/5.  Edema in left knee measures 52.8 cm.  Patient will benefit form physical therapy to incease left knee ROM and strength so she can return to living at home.    Pt will benefit from skilled therapeutic intervention in order to improve on the following deficits Abnormal gait;Decreased coordination;Difficulty walking;Decreased safety awareness;Decreased endurance;Decreased activity tolerance;Decreased balance;Decreased strength;Increased edema;Decreased mobility;Decreased scar mobility;Pain;Decreased range of motion   Rehab Potential Excellent   Clinical Impairments  Affecting Rehab Potential None   PT Frequency 3x / week   PT Duration 8 weeks   PT Treatment/Interventions Moist Heat;Therapeutic activities;Patient/family education;Therapeutic exercise;Manual techniques;Balance training;Functional mobility training;Cryotherapy;Neuromuscular re-education;Gait training;Stair training;Passive range of motion   PT Next Visit Plan left knee ROM and strength exercises   PT Home Exercise Plan Knee ROM, hamsting and gastroc stretch   Consulted and Agree with Plan of Care Patient          G-Codes - 06-Nov-2015 1143    Functional Assessment Tool Used fOTO score is 85% limitation  goal is 61% limitaitonCL   Functional Limitation Mobility: Walking and moving around   Mobility: Walking and Moving Around Current Status [phone]) At least 80 percent but less than 100 percent impaired, limited or restricted   Mobility: Walking and Moving Around Goal Status [phone]) At least 60 percent but less than 80 percent impaired, limited or restricted       Problem List Patient Active Problem List   Diagnosis Date Noted  . History of total knee arthroplasty 07/19/2015  . At risk for sleep apnea 07/17/2015  . Memory loss 04/05/2014  . Diabetes mellitus type 2 with complications, uncontrolled ([place]) 01/24/2014  . Chronic renal insufficiency, stage II (mild) 03/21/2013  . Encounter for long-term (current) use of other medications 07/29/2012  . Diabetes ([place]) 04/28/2012  . CVA (cerebral  vascular accident) ([place])   . EDEMA- LOCALIZED 01/06/2011  . Chronic systolic heart failure ([place]) 12/26/2010  . GERD 04/30/2010  . TRANSIENT ISCHEMIC ATTACKS, HX OF 04/30/2010  . DYSPEPSIA 04/14/2010  . WEAKNESS 03/19/2010  . CONSTIPATION 02/13/2010  . ANEMIA, MILD 01/14/2010  . Hyperlipidemia 09/16/2008  . HYPERTENSION, BENIGN 09/16/2008  . CAD, AUTOLOGOUS BYPASS GRAFT 09/16/2008  . SICK SINUS/ TACHY-BRADY SYNDROME 09/16/2008  . PACEMAKER, PERMANENT 09/16/2008  . Coronary atherosclerosis  10/25/2007  . Hypothyroidism 06/14/2007  . COLONIC POLYPS, HX OF 03/23/2007    GRAY,CHERYL,PT 10/15/2015, 11:46 AM  [facility] [address], [place], [place], [address] Phone: [phone]   Fax:  [phone]  Name: [name] MRN: [id number] Date of Birth: March 08, 1925

## 2015-10-22 ENCOUNTER — Ambulatory Visit: Payer: Medicare Other

## 2015-10-22 DIAGNOSIS — M25662 Stiffness of left knee, not elsewhere classified: Secondary | ICD-10-CM | POA: Diagnosis not present

## 2015-10-22 DIAGNOSIS — R269 Unspecified abnormalities of gait and mobility: Secondary | ICD-10-CM

## 2015-10-22 DIAGNOSIS — M7989 Other specified soft tissue disorders: Secondary | ICD-10-CM | POA: Diagnosis not present

## 2015-10-22 DIAGNOSIS — R29898 Other symptoms and signs involving the musculoskeletal system: Secondary | ICD-10-CM

## 2015-10-22 NOTE — Therapy (Signed)
[facility] [address] [place], [place], [address] Phone: [phone]   Fax:  [phone]  Physical Therapy Treatment  Patient Details  Name: [name] MRN: [id number] Date of Birth: 04-15-1925 Referring Provider: Dr. Latanya Maudlin  Encounter Date: 10/22/2015      PT End of Session - 10/22/15 [id number]    Visit Number 2   Number of Visits 10  use KX modifier   Date for PT Re-Evaluation 12/10/15   PT Start Time 0849   PT Stop Time 0929   PT Time Calculation (min) 40 min   Activity Tolerance Patient tolerated treatment well   Behavior During Therapy [facility] for tasks assessed/performed      Past Medical History  Diagnosis Date  . Sliding hiatal hernia   . Erosive esophagitis   . Peptic stricture of esophagus   . Diverticulosis   . Arthritis   . Hypothyroidism   . Diabetes mellitus     Type I  . Sick sinus syndrome [facility])     s/p Medtronic DDD pacemaker implanted; generator change 02/2012  . Hypertension   . Hyperlipidemia   . GERD (gastroesophageal reflux disease)   . Adenomatous colon polyp 07/2002  . Obesity   . Paroxysmal atrial fibrillation ([place]) 02/04/15    discovered on PPM remote interrogation 6/16  . Coronary artery disease     s/p CABG 2001 x 5  . CVA (cerebral vascular accident) [facility]) 2013 or 2014    Dr Evelena Leyden, Neurology  . Dementia   . History of swelling of feet     props up at night, circulations has been checked inlegs in past and was told it was ok    Past Surgical History  Procedure Laterality Date  . Pacemaker placement  03/25/2000    by Dr Olevia Perches  . Tonsillectomy and adenoidectomy    . Appendectomy    . Cholecystectomy    . Total abdominal hysterectomy w/ bilateral salpingoophorectomy      Endometiosis  . Replacement total knee  2004    Right  . Colonoscopy w/ polypectomy  2003  . Cataract extraction  05/2005  . Coronary artery bypass graft  02/2000    5 vessel  . Rotator cuff  repair Right 2009  . Permanent pacemaker generator change  03/18/2012    PPM gen change by Dr Rayann Heman  . Total knee arthroplasty Left 07/19/2015    Procedure: LEFT TOTAL KNEE ARTHROPLASTY;  Surgeon: Latanya Maudlin, MD;  [facility];  Service: Orthopedics;  Laterality: Left;    There were no vitals filed for this visit.  Visit Diagnosis:  Knee stiffness, left  Abnormality of gait  Weakness of left lower extremity      Subjective Assessment - 10/22/15 0853    Subjective Pt denies any pain this morning.  Pt reports taht she had PT at [facility] after surgery.     Patient is accompained by: Family member   How long can you walk comfortably? needs a rolling walker   Patient Stated Goals increase left knee flexion   Currently in Pain? No/denies   Pain Score 0-No pain                         [facility] Adult PT Treatment/Exercise - 10/22/15 0001    Exercises   Exercises Knee/Hip;Lumbar   Knee/Hip Exercises: Aerobic   Stationary Bike for ROM x 6 minutes   Nustep Level 1x 6  minutes  seat 9, arms 11   Knee/Hip Exercises: Standing   Hip Flexion Stengthening;Both;2 sets;10 reps   Hip Abduction Both;Stengthening;2 sets;10 reps   Other Standing Knee Exercises heel raises 2x10   Knee/Hip Exercises: Seated   Long Arc Quad Left;2 sets;10 reps;Weights   Long Arc Quad Weight 2 lbs.   Other Seated Knee/Hip Exercises knee flexion with green band 2x10                  PT Short Term Goals - 10/22/15 0904    PT SHORT TERM GOAL #1   Title be independent in initial HEP   Time 4   Status On-going   PT SHORT TERM GOAL #2   Title left knee PROM >/= 100 degrees   Time 4   Period Weeks   Status On-going           PT Long Term Goals - 10/15/15 1139    PT LONG TERM GOAL #1   Title be independent in advanced HEP   Time 8   Period Weeks   Status New   PT LONG TERM GOAL #2   Title FOTO score is </= 61% limitation   Time 8   Period Weeks   Status  New   PT LONG TERM GOAL #3   Title Left knee flexion AROM >/= 105 degree to make it easier to walk   Time 8   Period Weeks   Status New   PT LONG TERM GOAL #4   Title left knee strength 4+/5 so patient is able to return home to live   Time 8   Period Weeks   Status New   PT LONG TERM GOAL #5   Title pain decreased >/= 50%    Time 8   Period Weeks   Status New               Plan - 10/22/15 0904    Clinical Impression Statement Pt is an 81 y.o. female who is 3 months s/p Lt TKA.  Pt had PT at [facility] until the end of October.  Pt with limited Lt knee strength and AROM.  Pt has difficulty with gait and mobility due to spinal stenosis.  Pt tolerated seated exercise well today and required breaks after standing exercise.  Pt will  benefit from skilled PT to improve strength and mobility to improve safety and independence at home.     Pt will benefit from skilled therapeutic intervention in order to improve on the following deficits Abnormal gait;Decreased coordination;Difficulty walking;Decreased safety awareness;Decreased endurance;Decreased activity tolerance;Decreased balance;Decreased strength;Increased edema;Decreased mobility;Decreased scar mobility;Pain;Decreased range of motion   Rehab Potential Excellent   PT Frequency 3x / week   PT Duration 8 weeks   PT Treatment/Interventions Moist Heat;Therapeutic activities;Patient/family education;Therapeutic exercise;Manual techniques;Balance training;Functional mobility training;Cryotherapy;Neuromuscular re-education;Gait training;Stair training;Passive range of motion   PT Next Visit Plan Strength, mobility, endurance, ROM   Consulted and Agree with Plan of Care Patient        Problem List Patient Active Problem List   Diagnosis Date Noted  . History of total knee arthroplasty 07/19/2015  . At risk for sleep apnea 07/17/2015  . Memory loss 04/05/2014  . Diabetes mellitus type 2 with complications, uncontrolled  ([place]) 01/24/2014  . Chronic renal insufficiency, stage II (mild) 03/21/2013  . Encounter for long-term (current) use of other medications 07/29/2012  . Diabetes ([place]) 04/28/2012  . CVA (cerebral vascular accident) ([place])   . EDEMA- LOCALIZED 01/06/2011  .  Chronic systolic heart failure ([place]) 12/26/2010  . GERD 04/30/2010  . TRANSIENT ISCHEMIC ATTACKS, HX OF 04/30/2010  . DYSPEPSIA 04/14/2010  . WEAKNESS 03/19/2010  . CONSTIPATION 02/13/2010  . ANEMIA, MILD 01/14/2010  . Hyperlipidemia 09/16/2008  . HYPERTENSION, BENIGN 09/16/2008  . CAD, AUTOLOGOUS BYPASS GRAFT 09/16/2008  . SICK SINUS/ TACHY-BRADY SYNDROME 09/16/2008  . PACEMAKER, PERMANENT 09/16/2008  . Coronary atherosclerosis 10/25/2007  . Hypothyroidism 06/14/2007  . COLONIC POLYPS, HX OF 03/23/2007    Yani Lal, PT 10/22/2015, 9:23 AM  [facility] [address] [place], [place], [address] Phone: [phone]   Fax:  [phone]  Name: [name] MRN: [id number] Date of Birth: January 28, 1925

## 2015-10-24 ENCOUNTER — Ambulatory Visit: Payer: Medicare Other | Attending: Orthopedic Surgery

## 2015-10-24 DIAGNOSIS — M25662 Stiffness of left knee, not elsewhere classified: Secondary | ICD-10-CM | POA: Diagnosis not present

## 2015-10-24 DIAGNOSIS — R2681 Unsteadiness on feet: Secondary | ICD-10-CM | POA: Insufficient documentation

## 2015-10-24 DIAGNOSIS — M545 Low back pain: Secondary | ICD-10-CM | POA: Insufficient documentation

## 2015-10-24 DIAGNOSIS — R29898 Other symptoms and signs involving the musculoskeletal system: Secondary | ICD-10-CM | POA: Diagnosis not present

## 2015-10-24 DIAGNOSIS — M7989 Other specified soft tissue disorders: Secondary | ICD-10-CM | POA: Insufficient documentation

## 2015-10-24 DIAGNOSIS — R269 Unspecified abnormalities of gait and mobility: Secondary | ICD-10-CM | POA: Diagnosis not present

## 2015-10-24 NOTE — Therapy (Signed)
[facility] [address] [place], [place], [address] Phone: [phone]   Fax:  [phone]  Physical Therapy Treatment  Patient Details  Name: [name] MRN: [id number] Date of Birth: 23-Sep-1925 Referring Provider: Dr. Latanya Maudlin  Encounter Date: 10/24/2015      PT End of Session - 10/24/15 0919    Visit Number 3   Number of Visits 10  KX modifier   Date for PT Re-Evaluation 12/10/15   PT Start Time 0846   PT Stop Time 0926   PT Time Calculation (min) 40 min   Activity Tolerance Patient tolerated treatment well   Behavior During Therapy [facility] for tasks assessed/performed      Past Medical History  Diagnosis Date  . Sliding hiatal hernia   . Erosive esophagitis   . Peptic stricture of esophagus   . Diverticulosis   . Arthritis   . Hypothyroidism   . Diabetes mellitus     Type I  . Sick sinus syndrome [facility])     s/p Medtronic DDD pacemaker implanted; generator change 02/2012  . Hypertension   . Hyperlipidemia   . GERD (gastroesophageal reflux disease)   . Adenomatous colon polyp 07/2002  . Obesity   . Paroxysmal atrial fibrillation ([place]) 02/04/15    discovered on PPM remote interrogation 6/16  . Coronary artery disease     s/p CABG 2001 x 5  . CVA (cerebral vascular accident) [facility]) 2013 or 2014    Dr Evelena Leyden, Neurology  . Dementia   . History of swelling of feet     props up at night, circulations has been checked inlegs in past and was told it was ok    Past Surgical History  Procedure Laterality Date  . Pacemaker placement  03/25/2000    by Dr Olevia Perches  . Tonsillectomy and adenoidectomy    . Appendectomy    . Cholecystectomy    . Total abdominal hysterectomy w/ bilateral salpingoophorectomy      Endometiosis  . Replacement total knee  2004    Right  . Colonoscopy w/ polypectomy  2003  . Cataract extraction  05/2005  . Coronary artery bypass graft  02/2000    5 vessel  . Rotator cuff repair  Right 2009  . Permanent pacemaker generator change  03/18/2012    PPM gen change by Dr Rayann Heman  . Total knee arthroplasty Left 07/19/2015    Procedure: LEFT TOTAL KNEE ARTHROPLASTY;  Surgeon: Latanya Maudlin, MD;  [facility];  Service: Orthopedics;  Laterality: Left;    There were no vitals filed for this visit.  Visit Diagnosis:  Knee stiffness, left  Abnormality of gait  Weakness of left lower extremity      Subjective Assessment - 10/24/15 0853    Subjective No pain at the start of treatment.  I want to bend my knee more.     Currently in Pain? Yes   Pain Score 3    Pain Location Knee   Pain Orientation Left   Pain Descriptors / Indicators Sore;Tightness   Pain Type Acute pain   Aggravating Factors  only with bending the knee on the bike today   Pain Relieving Factors not bending the knee                         [facility] Adult PT Treatment/Exercise - 10/24/15 0001    Lumbar Exercises: Seated   Sit to Stand 20 reps  Sit to Stand Limitations use of arms for first 3-4 reps and then able to do without hands   Knee/Hip Exercises: Aerobic   Stationary Bike for ROM x 6 minutes   Nustep Level 1x 6 minutes  seat 9, arms 11   Knee/Hip Exercises: Standing   Other Standing Knee Exercises heel raises 2x10   Knee/Hip Exercises: Seated   Long Arc Quad Left;2 sets;10 reps;Weights   Long Arc Quad Weight 2 lbs.   Other Seated Knee/Hip Exercises knee flexion with green band 2x10   Marching Limitations 2x10 bil   Marching Weights 2 lbs.                  PT Short Term Goals - 10/22/15 0904    PT SHORT TERM GOAL #1   Title be independent in initial HEP   Time 4   Status On-going   PT SHORT TERM GOAL #2   Title left knee PROM >/= 100 degrees   Time 4   Period Weeks   Status On-going           PT Long Term Goals - 10/15/15 1139    PT LONG TERM GOAL #1   Title be independent in advanced HEP   Time 8   Period Weeks   Status New   PT LONG TERM  GOAL #2   Title FOTO score is </= 61% limitation   Time 8   Period Weeks   Status New   PT LONG TERM GOAL #3   Title Left knee flexion AROM >/= 105 degree to make it easier to walk   Time 8   Period Weeks   Status New   PT LONG TERM GOAL #4   Title left knee strength 4+/5 so patient is able to return home to live   Time 8   Period Weeks   Status New   PT LONG TERM GOAL #5   Title pain decreased >/= 50%    Time 8   Period Weeks   Status New               Plan - 10/24/15 0902    Clinical Impression Statement Pt is 3 months s/p Lt TKA.  Pt with limited Lt knee strength and AROM s/p surgery and has difficulty with gait and mobility due to spinal stenosis.  Pt tolerated exercise well today with breaks needed with standing exercise.  Pt will benefit from skilled PT to improve strength and mobility to improve safety and independence at home.   Pt will benefit from skilled therapeutic intervention in order to improve on the following deficits Abnormal gait;Decreased coordination;Difficulty walking;Decreased safety awareness;Decreased endurance;Decreased activity tolerance;Decreased balance;Decreased strength;Increased edema;Decreased mobility;Decreased scar mobility;Pain;Decreased range of motion   Rehab Potential Excellent   PT Frequency 3x / week   PT Duration 8 weeks   PT Treatment/Interventions Moist Heat;Therapeutic activities;Patient/family education;Therapeutic exercise;Manual techniques;Balance training;Functional mobility training;Cryotherapy;Neuromuscular re-education;Gait training;Stair training;Passive range of motion   PT Next Visit Plan Strength, mobility, endurance, ROM   Consulted and Agree with Plan of Care Patient        Problem List Patient Active Problem List   Diagnosis Date Noted  . History of total knee arthroplasty 07/19/2015  . At risk for sleep apnea 07/17/2015  . Memory loss 04/05/2014  . Diabetes mellitus type 2 with complications, uncontrolled  ([place]) 01/24/2014  . Chronic renal insufficiency, stage II (mild) 03/21/2013  . Encounter for long-term (current) use of other medications 07/29/2012  . Diabetes (  [place]) 04/28/2012  . CVA (cerebral vascular accident) ([place])   . EDEMA- LOCALIZED 01/06/2011  . Chronic systolic heart failure ([place]) 12/26/2010  . GERD 04/30/2010  . TRANSIENT ISCHEMIC ATTACKS, HX OF 04/30/2010  . DYSPEPSIA 04/14/2010  . WEAKNESS 03/19/2010  . CONSTIPATION 02/13/2010  . ANEMIA, MILD 01/14/2010  . Hyperlipidemia 09/16/2008  . HYPERTENSION, BENIGN 09/16/2008  . CAD, AUTOLOGOUS BYPASS GRAFT 09/16/2008  . SICK SINUS/ TACHY-BRADY SYNDROME 09/16/2008  . PACEMAKER, PERMANENT 09/16/2008  . Coronary atherosclerosis 10/25/2007  . Hypothyroidism 06/14/2007  . COLONIC POLYPS, HX OF 03/23/2007    TAKACS,KELLY, PT 10/24/2015, 9:20 AM  [facility] [address] [place], [place], [address] Phone: [phone]   Fax:  [phone]  Name: [name] MRN: [id number] Date of Birth: 1925/10/01

## 2015-10-28 ENCOUNTER — Encounter: Payer: Self-pay | Admitting: Physical Therapy

## 2015-10-28 ENCOUNTER — Ambulatory Visit: Payer: Medicare Other | Admitting: Physical Therapy

## 2015-10-28 DIAGNOSIS — M545 Low back pain: Secondary | ICD-10-CM | POA: Diagnosis not present

## 2015-10-28 DIAGNOSIS — R2681 Unsteadiness on feet: Secondary | ICD-10-CM

## 2015-10-28 DIAGNOSIS — R29898 Other symptoms and signs involving the musculoskeletal system: Secondary | ICD-10-CM

## 2015-10-28 DIAGNOSIS — R269 Unspecified abnormalities of gait and mobility: Secondary | ICD-10-CM

## 2015-10-28 DIAGNOSIS — M7989 Other specified soft tissue disorders: Secondary | ICD-10-CM

## 2015-10-28 DIAGNOSIS — M25662 Stiffness of left knee, not elsewhere classified: Secondary | ICD-10-CM | POA: Diagnosis not present

## 2015-10-28 NOTE — Therapy (Signed)
[facility] [address] [place], [place], [address] Phone: [phone]   Fax:  [phone]  Physical Therapy Treatment  Patient Details  Name: [name] MRN: [id number] Date of Birth: 08/08/1925 Referring Provider: Dr. Latanya Maudlin  Encounter Date: 10/28/2015      PT End of Session - 10/28/15 1019    Visit Number 4   Number of Visits 10   Date for PT Re-Evaluation 12/10/15   PT Start Time [id number]   PT Stop Time 1101   PT Time Calculation (min) 47 min   Activity Tolerance Patient tolerated treatment well   Behavior During Therapy [facility] for tasks assessed/performed      Past Medical History  Diagnosis Date  . Sliding hiatal hernia   . Erosive esophagitis   . Peptic stricture of esophagus   . Diverticulosis   . Arthritis   . Hypothyroidism   . Diabetes mellitus     Type I  . Sick sinus syndrome [facility])     s/p Medtronic DDD pacemaker implanted; generator change 02/2012  . Hypertension   . Hyperlipidemia   . GERD (gastroesophageal reflux disease)   . Adenomatous colon polyp 07/2002  . Obesity   . Paroxysmal atrial fibrillation ([place]) 02/04/15    discovered on PPM remote interrogation 6/16  . Coronary artery disease     s/p CABG 2001 x 5  . CVA (cerebral vascular accident) [facility]) 2013 or 2014    Dr Evelena Leyden, Neurology  . Dementia   . History of swelling of feet     props up at night, circulations has been checked inlegs in past and was told it was ok    Past Surgical History  Procedure Laterality Date  . Pacemaker placement  03/25/2000    by Dr Olevia Perches  . Tonsillectomy and adenoidectomy    . Appendectomy    . Cholecystectomy    . Total abdominal hysterectomy w/ bilateral salpingoophorectomy      Endometiosis  . Replacement total knee  2004    Right  . Colonoscopy w/ polypectomy  2003  . Cataract extraction  05/2005  . Coronary artery bypass graft  02/2000    5 vessel  . Rotator cuff repair Right 2009   . Permanent pacemaker generator change  03/18/2012    PPM gen change by Dr Rayann Heman  . Total knee arthroplasty Left 07/19/2015    Procedure: LEFT TOTAL KNEE ARTHROPLASTY;  Surgeon: Latanya Maudlin, MD;  Location: [facility];  Service: Orthopedics;  Laterality: Left;    There were no vitals filed for this visit.  Visit Diagnosis:  Knee stiffness, left  Abnormality of gait  Weakness of left lower extremity  Swelling of limb  Weakness of both legs  Gait instability      Subjective Assessment - 10/28/15 1018    Subjective Pt reports pain only with certain movements as getting out of the car, she  wish to have more bending in left knee   Currently in Pain? Yes   Pain Score 2    Pain Location Knee   Pain Orientation Left   Pain Type Surgical pain   Pain Onset More than a month ago   Pain Frequency Intermittent                         [facility] Adult PT Treatment/Exercise - 10/28/15 0001    Ambulation/Gait   Ambulation/Gait Yes   Ambulation/Gait Assistance 6: Modified independent (Device/Increase  time)   Assistive device Rolling walker   Gait Pattern Decreased step length - left;Decreased hip/knee flexion - left;Decreased dorsiflexion - left   Gait Comments need reminders for trunk extension and stay iinside walker   Exercises   Exercises Knee/Hip;Lumbar   Lumbar Exercises: Seated   Sit to Stand 20 reps   Sit to Stand Limitations use of arms, but less for last repetitions   Knee/Hip Exercises: Aerobic   Stationary Bike for ROM x 6 minutes   Nustep Level 1x 12 minutes  seat#9, arms #11   Knee/Hip Exercises: Standing   Other Standing Knee Exercises heel raises 2x10   Knee/Hip Exercises: Seated   Long Arc Quad Left;2 sets;10 reps;Weights   Long Arc Quad Weight 2 lbs.   Other Seated Knee/Hip Exercises knee flexion with green band 2x10   Marching Limitations 2x10 bil   Marching Weights 2 lbs.                  PT Short Term Goals - 10/28/15 1109    PT  SHORT TERM GOAL #1   Title be independent in initial HEP   Time 4   Period Weeks   Status On-going   PT SHORT TERM GOAL #2   Title left knee PROM >/= 100 degrees   Time 4   Period Weeks   Status On-going   PT SHORT TERM GOAL #3   Title left knee strength 4-/5 so she can walk safely with walker   Time 4   Period Weeks   Status On-going           PT Long Term Goals - 10/28/15 1111    PT LONG TERM GOAL #1   Title be independent in advanced HEP   Time 8   Period Weeks   Status On-going   PT LONG TERM GOAL #2   Title FOTO score is </= 61% limitation   Time 8   Period Weeks   Status On-going   PT LONG TERM GOAL #3   Title Left knee flexion AROM >/= 105 degree to make it easier to walk   Time 8   Period Weeks   Status On-going   PT LONG TERM GOAL #4   Title left knee strength 4+/5 so patient is able to return home to live   Time 8   Period Weeks   Status On-going   PT LONG TERM GOAL #5   Title pain decreased >/= 50%    Time 8   Period Weeks   Status On-going               Plan - 10/28/15 1103    Clinical Impression Statement pt is with limited Lt knee strength and decreased AROM s/p surgery and has difficulty with gait and mobility due to spinal steponsis. Pt will benefit from skilled PT to improve strength and mobility to improve safety and independence at home   Pt will benefit from skilled therapeutic intervention in order to improve on the following deficits Abnormal gait;Decreased coordination;Difficulty walking;Decreased safety awareness;Decreased endurance;Decreased activity tolerance;Decreased balance;Decreased strength;Increased edema;Decreased mobility;Decreased scar mobility;Pain;Decreased range of motion   Rehab Potential Excellent   PT Frequency 3x / week   PT Duration 8 weeks   PT Next Visit Plan Strength, mobility, endurance, ROM   PT Home Exercise Plan Knee ROM, hamsting and gastroc stretch   Consulted and Agree with Plan of Care Patient         Problem List Patient Active Problem  List   Diagnosis Date Noted  . History of total knee arthroplasty 07/19/2015  . At risk for sleep apnea 07/17/2015  . Memory loss 04/05/2014  . Diabetes mellitus type 2 with complications, uncontrolled ([place]) 01/24/2014  . Chronic renal insufficiency, stage II (mild) 03/21/2013  . Encounter for long-term (current) use of other medications 07/29/2012  . Diabetes ([place]) 04/28/2012  . CVA (cerebral vascular accident) ([place])   . EDEMA- LOCALIZED 01/06/2011  . Chronic systolic heart failure ([place]) 12/26/2010  . GERD 04/30/2010  . TRANSIENT ISCHEMIC ATTACKS, HX OF 04/30/2010  . DYSPEPSIA 04/14/2010  . WEAKNESS 03/19/2010  . CONSTIPATION 02/13/2010  . ANEMIA, MILD 01/14/2010  . Hyperlipidemia 09/16/2008  . HYPERTENSION, BENIGN 09/16/2008  . CAD, AUTOLOGOUS BYPASS GRAFT 09/16/2008  . SICK SINUS/ TACHY-BRADY SYNDROME 09/16/2008  . PACEMAKER, PERMANENT 09/16/2008  . Coronary atherosclerosis 10/25/2007  . Hypothyroidism 06/14/2007  . COLONIC POLYPS, HX OF 03/23/2007    NAUMANN-HOUEGNIFIO,Sanii Kukla PTA 10/28/2015, 2:29 PM  [facility] [address], [place], [place], [address] Phone: [phone]   Fax:  [phone]  Name: ESSENCE HEACOCK MRN: [id number] Date of Birth: 10-16-1925

## 2015-10-29 ENCOUNTER — Ambulatory Visit (INDEPENDENT_AMBULATORY_CARE_PROVIDER_SITE_OTHER): Payer: Medicare Other | Admitting: Endocrinology

## 2015-10-29 ENCOUNTER — Encounter: Payer: Self-pay | Admitting: Endocrinology

## 2015-10-29 VITALS — BP 126/80 | HR 67 | Temp 97.4°F | Ht 65.5 in | Wt 197.0 lb

## 2015-10-29 DIAGNOSIS — E118 Type 2 diabetes mellitus with unspecified complications: Secondary | ICD-10-CM | POA: Diagnosis not present

## 2015-10-29 DIAGNOSIS — E1165 Type 2 diabetes mellitus with hyperglycemia: Secondary | ICD-10-CM | POA: Diagnosis not present

## 2015-10-29 DIAGNOSIS — I2510 Atherosclerotic heart disease of native coronary artery without angina pectoris: Secondary | ICD-10-CM | POA: Diagnosis not present

## 2015-10-29 LAB — POCT GLYCOSYLATED HEMOGLOBIN (HGB A1C): Hemoglobin A1C: 6.2

## 2015-10-29 MED ORDER — ACARBOSE 50 MG PO TABS: 50.0000 mg | ORAL_TABLET | Freq: Two times a day (BID) | ORAL | Status: DC

## 2015-10-29 NOTE — Patient Instructions (Addendum)
Please reduce the acarbose to just twice a day (breakfast and supper). check your blood sugar once a day.  vary the time of day when you check, between before the 3 meals, and at bedtime.  also check if you have symptoms of your blood sugar being too high or too low.  please keep a record of the readings and bring it to your next appointment here.  please call us sooner if your blood sugar goes below 70, or if you have a lot of readings over 200.   Please come back for a follow-up appointment in 4 months.

## 2015-10-29 NOTE — Progress Notes (Signed)
Subjective:    Patient ID: [name], female    DOB: 1925-05-17, 81 y.o.   MRN: [id number]  HPI Pt returns for f/u of diabetes mellitus: DM type: 2 Dx'ed: 99991111 Complications: sensory neuropathy, CVA, renal insuff, and CAD.  Therapy: 6 oral meds.   GDM: never. DKA: never. Severe hypoglycemia: never.   Pancreatitis: never.   Other: she is not on metformin, due to CHF, she is not on actos, due to edema; she is off amaryl, due to renal insuff.  Pt says she'll take insulin if necessary.   Interval history:    i asked dtr, who says cbg's are well-controlled.  she brings a record of pt's cbg's which i have reviewed today.  Most are in the low to mid-100's Past Medical History  Diagnosis Date  . Sliding hiatal hernia   . Erosive esophagitis   . Peptic stricture of esophagus   . Diverticulosis   . Arthritis   . Hypothyroidism   . Diabetes mellitus     Type I  . Sick sinus syndrome [facility])     s/p Medtronic DDD pacemaker implanted; generator change 02/2012  . Hypertension   . Hyperlipidemia   . GERD (gastroesophageal reflux disease)   . Adenomatous colon polyp 07/2002  . Obesity   . Paroxysmal atrial fibrillation ([place]) 02/04/15    discovered on PPM remote interrogation 6/16  . Coronary artery disease     s/p CABG 2001 x 5  . CVA (cerebral vascular accident) [facility]) 2013 or 2014    Dr Evelena Leyden, Neurology  . Dementia   . History of swelling of feet     props up at night, circulations has been checked inlegs in past and was told it was ok    Past Surgical History  Procedure Laterality Date  . Pacemaker placement  03/25/2000    by Dr Olevia Perches  . Tonsillectomy and adenoidectomy    . Appendectomy    . Cholecystectomy    . Total abdominal hysterectomy w/ bilateral salpingoophorectomy      Endometiosis  . Replacement total knee  2004    Right  . Colonoscopy w/ polypectomy  2003  . Cataract extraction  05/2005  . Coronary artery bypass graft  02/2000    5 vessel  . Rotator cuff repair  Right 2009  . Permanent pacemaker generator change  03/18/2012    PPM gen change by Dr Rayann Heman  . Total knee arthroplasty Left 07/19/2015    Procedure: LEFT TOTAL KNEE ARTHROPLASTY;  Surgeon: Latanya Maudlin, MD;  Location: [facility];  Service: Orthopedics;  Laterality: Left;    Social History   Social History  . Marital Status: Widowed    Spouse Name: N/A  . Number of Children: 1  . Years of Education: N/A   Occupational History  . Retired    Social History Main Topics  . Smoking status: Former Research scientist (life sciences)  . Smokeless tobacco: Former Systems developer    Quit date: 11/23/1981     Comment: quit 28+ yrs ago (as of 2012)  . Alcohol Use: No  . Drug Use: No  . Sexual Activity: No   Other Topics Concern  . Not on file   Social History Narrative   Patient lives at home alone. Patient's daughter was with her [name] ) [phone]   Retired.   Education college   Right handed.   Caffeine one cup of coffee daily.   .    Current Outpatient Prescriptions on File Prior to Visit  Medication Sig Dispense Refill  . acetaminophen (TYLENOL) 500 MG tablet Take 500 mg by mouth every 8 (eight) hours as needed. Take 2 tab = 1,000 mg PO Q 8 hours PRN    . amitriptyline (ELAVIL) 25 MG tablet TAKE 1 TABLET (25 MG TOTAL) BY MOUTH AT BEDTIME. 90 tablet 2  . apixaban (ELIQUIS) 2.5 MG TABS tablet Take 1 tablet (2.5 mg total) by mouth 2 (two) times daily. 60 tablet 11  . atorvastatin (LIPITOR) 20 MG tablet TAKE 1 TABLET BY MOUTH EVERY DAY (IN PLACE OF PRAVASTATIN) 30 tablet 2  . bromocriptine (PARLODEL) 2.5 MG tablet TAKE 1 TABLET (2.5 MG TOTAL) BY MOUTH AT BEDTIME. 30 tablet 10  . Calcium-Vitamin D 600-200 MG-UNIT per tablet Take 1 tablet by mouth daily.    . colesevelam (WELCHOL) 625 MG tablet Take 3 tablets (1,875 mg total) by mouth 2 (two) times daily before a meal.    . diltiazem (CARDIZEM) 30 MG tablet Take 1 tablet (30 mg total) by mouth every 6 (six) hours as needed. 120 tablet 3  . docusate sodium  (COLACE) 100 MG capsule Take 100 mg by mouth 2 (two) times daily.    Marland Kitchen donepezil (ARICEPT) 10 MG tablet Take 1 tablet (10 mg total) by mouth at bedtime. 90 tablet 3  . enalapril (VASOTEC) 20 MG tablet TAKE 1 TABLET BY MOUTH DAILY 90 tablet 1  . enalapril (VASOTEC) 20 MG tablet TAKE 1 TABLET BY MOUTH DAILY 90 tablet 0  . fish oil-omega-3 fatty acids 1000 MG capsule Take 1 g by mouth daily.    . INVOKANA 100 MG TABS tablet TAKE 1 TABLET BY MOUTH EVERY DAY 90 tablet 2  . levothyroxine (SYNTHROID, LEVOTHROID) 25 MCG tablet Take 25 mcg by mouth daily before breakfast. Take 1 1/2 tablets daily    . meclizine (ANTIVERT) 25 MG tablet Take 1 tablet (25 mg total) by mouth 3 (three) times daily as needed for dizziness. 20 tablet 0  . memantine (NAMENDA XR) 28 MG CP24 24 hr capsule Take 1 capsule (28 mg total) by mouth daily. 90 capsule 3  . metoprolol (TOPROL-XL) 200 MG 24 hr tablet TAKE 1 TABLET (200 MG TOTAL) BY MOUTH DAILY. 30 tablet 11  . niacin 500 MG tablet Take 500 mg by mouth every evening.     . nitroGLYCERIN (NITROSTAT) 0.3 MG SL tablet Place 1 tablet (0.3 mg total) under the tongue every 5 (five) minutes as needed. For chest pain 90 tablet 3  . pantoprazole (PROTONIX) 40 MG tablet Take 1 tablet (40 mg total) by mouth daily. 90 tablet 1  . repaglinide (PRANDIN) 2 MG tablet TAKE 1 TABLET (2 MG TOTAL) BY MOUTH 3 (THREE) TIMES DAILY BEFORE MEALS. 90 tablet 11  . sitaGLIPtin (JANUVIA) 100 MG tablet Take 100 mg by mouth daily. Take half tablet daily    . traMADol (ULTRAM) 50 MG tablet Take 1-2 tablets (50-100 mg total) by mouth every 6 (six) hours as needed for moderate pain. 80 tablet 0   Current Facility-Administered Medications on File Prior to Visit  Medication Dose Route Frequency Provider Last Rate Last Dose  . bupivacaine liposome (EXPAREL) 1.3 % injection 266 mg  20 mL Infiltration Once [employer], PA-C        Allergies  Allergen Reactions  . Codeine Rash  . Robaxin [Methocarbamol]  Other (See Comments)    Too strong     Family History  Problem Relation Age of Onset  . Colon cancer Mother 77  .  Breast cancer Mother   . Transient ischemic attack Mother   . Cancer Mother   . Hypertension Mother   . Stroke Mother   . Diabetes Mother   . Colon cancer Brother 19  . Cancer Brother   . Hypertension Brother   . Heart attack Brother   . Breast cancer Sister   . Cancer Sister   . Diabetes Sister   . Prostate cancer Brother   . Heart attack Brother   . Heart disease      3 brothers had MI; 1 pre 71  . Hypertension Sister   . Heart attack Daughter   . Diabetes Maternal Aunt     BP 126/80 mmHg  Pulse 67  Temp(Src) 97.4 F (36.3 C) (Oral)  Ht 5' 5.5" (1.664 m)  Wt 197 lb (89.359 kg)  BMI 32.27 kg/m2  SpO2 97%  Review of Systems She has 3 episodes of diarrhea in the past 4 weeks.      Objective:   Physical Exam VITAL SIGNS:  See vs page GENERAL: no distress.  In wheelchair SKIN: no ulcer on the feet. feet are of normal color and temp.  EXTEMITIES: no deformity. no ulcer on the feet. Trace bilat leg edema. There are bilat varicosities. there is a vein harvest scar on the right leg.  PULSES: dorsalis pedis intact bilat.  NEURO: sensation is intact to touch on the feet, but decreased from normal.    Lab Results  Component Value Date   HGBA1C 6.2 10/29/2015      Assessment & Plan:  DM: well-controlled Diarrhea, new, possibly due to acarbose.  Patient is advised the following: Patient Instructions  Please reduce the acarbose to just twice a day (breakfast and supper). check your blood sugar once a day.  vary the time of day when you check, between before the 3 meals, and at bedtime.  also check if you have symptoms of your blood sugar being too high or too low.  please keep a record of the readings and bring it to your next appointment here.  please call us sooner if your blood sugar goes below 70, or if you have a lot of readings over 200.   Please  come back for a follow-up appointment in 4 months.

## 2015-10-31 ENCOUNTER — Ambulatory Visit: Payer: Medicare Other | Admitting: Physical Therapy

## 2015-10-31 DIAGNOSIS — R269 Unspecified abnormalities of gait and mobility: Secondary | ICD-10-CM | POA: Diagnosis not present

## 2015-10-31 DIAGNOSIS — M25662 Stiffness of left knee, not elsewhere classified: Secondary | ICD-10-CM | POA: Diagnosis not present

## 2015-10-31 DIAGNOSIS — R29898 Other symptoms and signs involving the musculoskeletal system: Secondary | ICD-10-CM

## 2015-10-31 DIAGNOSIS — M7989 Other specified soft tissue disorders: Secondary | ICD-10-CM | POA: Diagnosis not present

## 2015-10-31 DIAGNOSIS — R2681 Unsteadiness on feet: Secondary | ICD-10-CM | POA: Diagnosis not present

## 2015-10-31 DIAGNOSIS — M545 Low back pain: Secondary | ICD-10-CM | POA: Diagnosis not present

## 2015-10-31 NOTE — Therapy (Signed)
[facility] [address] [place], [place], [address] Phone: [phone]   Fax:  [phone]  Physical Therapy Treatment  Patient Details  Name: [name] MRN: [id number] Date of Birth: 08-Oct-1925 Referring Provider: Dr. Latanya Maudlin  Encounter Date: 10/31/2015      PT End of Session - 10/31/15 0915    Visit Number 5   Number of Visits 10   Date for PT Re-Evaluation 12/10/15   PT Start Time 0842   PT Stop Time 0920   PT Time Calculation (min) 38 min   Activity Tolerance Patient tolerated treatment well   Behavior During Therapy [facility] for tasks assessed/performed      Past Medical History  Diagnosis Date  . Sliding hiatal hernia   . Erosive esophagitis   . Peptic stricture of esophagus   . Diverticulosis   . Arthritis   . Hypothyroidism   . Diabetes mellitus     Type I  . Sick sinus syndrome [facility])     s/p Medtronic DDD pacemaker implanted; generator change 02/2012  . Hypertension   . Hyperlipidemia   . GERD (gastroesophageal reflux disease)   . Adenomatous colon polyp 07/2002  . Obesity   . Paroxysmal atrial fibrillation ([place]) 02/04/15    discovered on PPM remote interrogation 6/16  . Coronary artery disease     s/p CABG 2001 x 5  . CVA (cerebral vascular accident) [facility]) 2013 or 2014    Dr Evelena Leyden, Neurology  . Dementia   . History of swelling of feet     props up at night, circulations has been checked inlegs in past and was told it was ok    Past Surgical History  Procedure Laterality Date  . Pacemaker placement  03/25/2000    by Dr Olevia Perches  . Tonsillectomy and adenoidectomy    . Appendectomy    . Cholecystectomy    . Total abdominal hysterectomy w/ bilateral salpingoophorectomy      Endometiosis  . Replacement total knee  2004    Right  . Colonoscopy w/ polypectomy  2003  . Cataract extraction  05/2005  . Coronary artery bypass graft  02/2000    5 vessel  . Rotator cuff repair Right 2009   . Permanent pacemaker generator change  03/18/2012    PPM gen change by Dr Rayann Heman  . Total knee arthroplasty Left 07/19/2015    Procedure: LEFT TOTAL KNEE ARTHROPLASTY;  Surgeon: Latanya Maudlin, MD;  Location[facility];  Service: Orthopedics;  Laterality: Left;    There were no vitals filed for this visit.  Visit Diagnosis:  Knee stiffness, left  Abnormality of gait  Weakness of left lower extremity  Swelling of limb      Subjective Assessment - 10/31/15 0848    Subjective Pt reports less pain with getting in/out of car, still most pain with bending   Patient is accompained by: Family member   Currently in Pain? No/denies   Pain Score --  no pain at rest                         [facility] Adult PT Treatment/Exercise - 10/31/15 0001    Ambulation/Gait   Ambulation/Gait Assistance 6: Modified independent (Device/Increase time)   Ambulation Distance (Feet) 100 Feet  x2 cues for step through pattern,staying close to RW,posture   Assistive device Rolling walker   Gait Pattern Decreased step length - right;Decreased stance time - left;Decreased hip/knee flexion -  left   Gait Comments cues for staying in RW and posture   Knee/Hip Exercises: Aerobic   Stationary Bike for ROM x 5 mins   Knee/Hip Exercises: Seated   Long Arc Quad Strengthening;Left;2 sets;10 reps   Long Arc Quad Weight 2 lbs.   Other Seated Knee/Hip Exercises knee flex red band 2 x 10   Marching Limitations 2 x 10 bilat   Marching Weights 2 lbs.   Abduction/Adduction  Both;2 sets;10 reps  red t band   Sit to Sand 20 reps;with UE support                PT Education - 10/31/15 0915    Education provided No          PT Short Term Goals - 10/31/15 0921    PT SHORT TERM GOAL #1   Title be independent in initial HEP   Time 4   Period Weeks   Status On-going   PT SHORT TERM GOAL #2   Title left knee PROM >/= 100 degrees   Time 4   Period Weeks   Status On-going   PT SHORT TERM GOAL  #3   Title left knee strength 4-/5 so she can walk safely with walker   Time 4   Period Weeks   Status On-going           PT Long Term Goals - 10/31/15 [id number]    PT LONG TERM GOAL #1   Title be independent in advanced HEP   Time 8   Period Weeks   Status On-going   PT LONG TERM GOAL #2   Title FOTO score is </= 61% limitation   Time 8   Period Weeks   Status On-going   PT LONG TERM GOAL #3   Title Left knee flexion AROM >/= 105 degree to make it easier to walk   Time 8   Period Weeks   Status On-going   PT LONG TERM GOAL #4   Title left knee strength 4+/5 so patient is able to return home to live   Time 8   Period Weeks   Status On-going   PT LONG TERM GOAL #5   Title pain decreased >/= 50%    Time 8   Period Weeks   Status On-going               Plan - 10/31/15 0915    Clinical Impression Statement Pt frustrated with decreased Lt knee ROM, PT encouraged pt to continue stretching at home.   Pt had to leave early today due to needing to get to another appointment.  Pt will continue to benefit from skilled PT to improve strength, mobility and independence.   Pt will benefit from skilled therapeutic intervention in order to improve on the following deficits Abnormal gait;Decreased coordination;Difficulty walking;Decreased safety awareness;Decreased endurance;Decreased activity tolerance;Decreased balance;Decreased strength;Increased edema;Decreased mobility;Decreased scar mobility;Pain;Decreased range of motion   PT Frequency 3x / week   PT Duration 8 weeks   PT Treatment/Interventions Moist Heat;Therapeutic activities;Patient/family education;Therapeutic exercise;Manual techniques;Balance training;Functional mobility training;Cryotherapy;Neuromuscular re-education;Gait training;Stair training;Passive range of motion   PT Next Visit Plan ROM, endurance strength, mobility        Problem List Patient Active Problem List   Diagnosis Date Noted  . History of total  knee arthroplasty 07/19/2015  . At risk for sleep apnea 07/17/2015  . Memory loss 04/05/2014  . Diabetes mellitus type 2 with complications, uncontrolled ([place]) 01/24/2014  . Chronic renal insufficiency, stage  II (mild) 03/21/2013  . Encounter for long-term (current) use of other medications 07/29/2012  . Diabetes ([place]) 04/28/2012  . CVA (cerebral vascular accident) ([place])   . EDEMA- LOCALIZED 01/06/2011  . Chronic systolic heart failure ([place]) 12/26/2010  . GERD 04/30/2010  . TRANSIENT ISCHEMIC ATTACKS, HX OF 04/30/2010  . DYSPEPSIA 04/14/2010  . WEAKNESS 03/19/2010  . CONSTIPATION 02/13/2010  . ANEMIA, MILD 01/14/2010  . Hyperlipidemia 09/16/2008  . HYPERTENSION, BENIGN 09/16/2008  . CAD, AUTOLOGOUS BYPASS GRAFT 09/16/2008  . SICK SINUS/ TACHY-BRADY SYNDROME 09/16/2008  . PACEMAKER, PERMANENT 09/16/2008  . Coronary atherosclerosis 10/25/2007  . Hypothyroidism 06/14/2007  . COLONIC POLYPS, HX OF 03/23/2007    Isabelle Course, PT, DPT  10/31/2015, 9:23 AM  [facility] [address] [place], [place], [address] Phone: [phone]   Fax:  [phone]  Name: [name] MRN: [id number] Date of Birth: [birth year]

## 2015-11-03 ENCOUNTER — Other Ambulatory Visit: Payer: Self-pay | Admitting: Internal Medicine

## 2015-11-04 ENCOUNTER — Other Ambulatory Visit: Payer: Self-pay | Admitting: Emergency Medicine

## 2015-11-04 ENCOUNTER — Encounter: Payer: Self-pay | Admitting: Physical Therapy

## 2015-11-04 ENCOUNTER — Ambulatory Visit: Payer: Medicare Other | Admitting: Physical Therapy

## 2015-11-04 ENCOUNTER — Other Ambulatory Visit: Payer: Self-pay | Admitting: Internal Medicine

## 2015-11-04 DIAGNOSIS — M545 Low back pain: Secondary | ICD-10-CM | POA: Diagnosis not present

## 2015-11-04 DIAGNOSIS — M7989 Other specified soft tissue disorders: Secondary | ICD-10-CM | POA: Diagnosis not present

## 2015-11-04 DIAGNOSIS — R269 Unspecified abnormalities of gait and mobility: Secondary | ICD-10-CM

## 2015-11-04 DIAGNOSIS — R2681 Unsteadiness on feet: Secondary | ICD-10-CM | POA: Diagnosis not present

## 2015-11-04 DIAGNOSIS — M25662 Stiffness of left knee, not elsewhere classified: Secondary | ICD-10-CM

## 2015-11-04 DIAGNOSIS — R29898 Other symptoms and signs involving the musculoskeletal system: Secondary | ICD-10-CM

## 2015-11-04 MED ORDER — ATORVASTATIN CALCIUM 20 MG PO TABS: ORAL_TABLET | ORAL | Status: DC

## 2015-11-04 NOTE — Therapy (Signed)
[facility] [address], [place], [place], [address] Phone: [phone]   Fax:  [phone]  Physical Therapy Treatment  Patient Details  Name: [name] MRN: [id number] Date of Birth: November 20, 1925 Referring Provider: Dr. Latanya Maudlin  Encounter Date: 11/04/2015      PT End of Session - 11/04/15 1628    Visit Number 6   Number of Visits 10   Date for PT Re-Evaluation 12/10/15   PT Start Time 1620   PT Stop Time 1700   PT Time Calculation (min) 40 min   Activity Tolerance Patient tolerated treatment well   Behavior During Therapy [facility] for tasks assessed/performed      Past Medical History  Diagnosis Date  . Sliding hiatal hernia   . Erosive esophagitis   . Peptic stricture of esophagus   . Diverticulosis   . Arthritis   . Hypothyroidism   . Diabetes mellitus     Type I  . Sick sinus syndrome [facility])     s/p Medtronic DDD pacemaker implanted; generator change 02/2012  . Hypertension   . Hyperlipidemia   . GERD (gastroesophageal reflux disease)   . Adenomatous colon polyp 07/2002  . Obesity   . Paroxysmal atrial fibrillation ([place]) 02/04/15    discovered on PPM remote interrogation 6/16  . Coronary artery disease     s/p CABG 2001 x 5  . CVA (cerebral vascular accident) [facility]) 2013 or 2014    Dr Evelena Leyden, Neurology  . Dementia   . History of swelling of feet     props up at night, circulations has been checked inlegs in past and was told it was ok    Past Surgical History  Procedure Laterality Date  . Pacemaker placement  03/25/2000    by Dr Olevia Perches  . Tonsillectomy and adenoidectomy    . Appendectomy    . Cholecystectomy    . Total abdominal hysterectomy w/ bilateral salpingoophorectomy      Endometiosis  . Replacement total knee  2004    Right  . Colonoscopy w/ polypectomy  2003  . Cataract extraction  05/2005  . Coronary artery bypass graft  02/2000    5 vessel  . Rotator cuff repair Right 2009   . Permanent pacemaker generator change  03/18/2012    PPM gen change by Dr Rayann Heman  . Total knee arthroplasty Left 07/19/2015    Procedure: LEFT TOTAL KNEE ARTHROPLASTY;  Surgeon: Latanya Maudlin, MD;  [facility]: [facility];  Service: Orthopedics;  Laterality: Left;    There were no vitals filed for this visit.  Visit Diagnosis:  Knee stiffness, left  Abnormality of gait  Weakness of left lower extremity      Subjective Assessment - 11/04/15 1625    Subjective Pt reports the left leg feels very tight and bending is difficult and limited, getting in and out of the car is difficult.   Patient is accompained by: Family member  son in law   Limitations Standing   How long can you stand comfortably? difficult   How long can you walk comfortably? needs a rolling walker   Patient Stated Goals increase left knee flexion   Currently in Pain? No/denies            [facility] PT Assessment - 11/04/15 0001    Assessment   Referring Provider Dr. Latanya Maudlin                     [facility]  Adult PT Treatment/Exercise - 11/04/15 0001    Ambulation/Gait   Ambulation/Gait Yes   Ambulation/Gait Assistance 6: Modified independent (Device/Increase time)   Ambulation Distance (Feet) 100 Feet  100 feet x 2 vc's to incr hip flexion   Assistive device Rolling walker   Gait Pattern Decreased step length - right;Decreased stance time - left;Decreased hip/knee flexion - left   Gait Comments cues for staying in RW and posture   Exercises   Exercises Knee/Hip;Lumbar   Lumbar Exercises: Seated   Sit to Stand Limitations --  in standing turning head to Lt and right x 2 each time   Knee/Hip Exercises: Aerobic   Stationary Bike for ROM x 6 mins   Knee/Hip Exercises: Seated   Long Arc Quad Strengthening;Left;2 sets;10 reps   Long Arc Quad Weight 2 lbs.   Other Seated Knee/Hip Exercises knee flex red band 2 x 10   Marching Limitations 2 x 10 bilat   Marching Weights 2 lbs.   Abduction/Adduction   Both;2 sets;10 reps  with red t-band   Sit to Sand 20 reps;with UE support  in standing turning head to Lt/Rt to change weightshift LE                   PT Short Term Goals - 11/04/15 1635    PT SHORT TERM GOAL #1   Title be independent in initial HEP   Time 4   Period Weeks   Status On-going   PT SHORT TERM GOAL #2   Title left knee PROM >/= 100 degrees   Time 4   Period Weeks   Status On-going   PT SHORT TERM GOAL #3   Title left knee strength 4-/5 so she can walk safely with walker   Time 4   Period Weeks   Status On-going           PT Long Term Goals - 10/31/15 [id number]    PT LONG TERM GOAL #1   Title be independent in advanced HEP   Time 8   Period Weeks   Status On-going   PT LONG TERM GOAL #2   Title FOTO score is </= 61% limitation   Time 8   Period Weeks   Status On-going   PT LONG TERM GOAL #3   Title Left knee flexion AROM >/= 105 degree to make it easier to walk   Time 8   Period Weeks   Status On-going   PT LONG TERM GOAL #4   Title left knee strength 4+/5 so patient is able to return home to live   Time 8   Period Weeks   Status On-going   PT LONG TERM GOAL #5   Title pain decreased >/= 50%    Time 8   Period Weeks   Status On-going               Plan - 11/04/15 1629    Clinical Impression Statement Pt reports every day she feels a little imrovement. Pt will continue to benefit from skilled PT to inmprove strength, mobility and independence   Rehab Potential Excellent   PT Frequency 3x / week   PT Duration 8 weeks   PT Treatment/Interventions Moist Heat;Therapeutic activities;Patient/family education;Therapeutic exercise;Manual techniques;Balance training;Functional mobility training;Cryotherapy;Neuromuscular re-education;Gait training;Stair training;Passive range of motion   PT Next Visit Plan ROM, endurance strength, mobility   PT Home Exercise Plan Knee ROM, hamsting and gastroc stretch   Consulted and Agree with Plan of  Care  Patient        Problem List Patient Active Problem List   Diagnosis Date Noted  . History of total knee arthroplasty 07/19/2015  . At risk for sleep apnea 07/17/2015  . Memory loss 04/05/2014  . Diabetes mellitus type 2 with complications, uncontrolled ([place]) 01/24/2014  . Chronic renal insufficiency, stage II (mild) 03/21/2013  . Encounter for long-term (current) use of other medications 07/29/2012  . Diabetes ([place]) 04/28/2012  . CVA (cerebral vascular accident) ([place])   . EDEMA- LOCALIZED 01/06/2011  . Chronic systolic heart failure ([place]) 12/26/2010  . GERD 04/30/2010  . TRANSIENT ISCHEMIC ATTACKS, HX OF 04/30/2010  . DYSPEPSIA 04/14/2010  . WEAKNESS 03/19/2010  . CONSTIPATION 02/13/2010  . ANEMIA, MILD 01/14/2010  . Hyperlipidemia 09/16/2008  . HYPERTENSION, BENIGN 09/16/2008  . CAD, AUTOLOGOUS BYPASS GRAFT 09/16/2008  . SICK SINUS/ TACHY-BRADY SYNDROME 09/16/2008  . PACEMAKER, PERMANENT 09/16/2008  . Coronary atherosclerosis 10/25/2007  . Hypothyroidism 06/14/2007  . COLONIC POLYPS, HX OF 03/23/2007    NAUMANN-HOUEGNIFIO,Jullianna Gabor PTA 11/04/2015, 5:08 PM  [facility] [address], [place], [place], [address] Phone: [phone]   Fax:  [phone]  Name: [name] MRN: [id number] Date of Birth: 1925-08-10

## 2015-11-06 ENCOUNTER — Ambulatory Visit: Payer: Medicare Other | Admitting: Physical Therapy

## 2015-11-06 ENCOUNTER — Encounter: Payer: Self-pay | Admitting: Physical Therapy

## 2015-11-06 DIAGNOSIS — R2681 Unsteadiness on feet: Secondary | ICD-10-CM

## 2015-11-06 DIAGNOSIS — M7989 Other specified soft tissue disorders: Secondary | ICD-10-CM | POA: Diagnosis not present

## 2015-11-06 DIAGNOSIS — R29898 Other symptoms and signs involving the musculoskeletal system: Secondary | ICD-10-CM

## 2015-11-06 DIAGNOSIS — M25662 Stiffness of left knee, not elsewhere classified: Secondary | ICD-10-CM | POA: Diagnosis not present

## 2015-11-06 DIAGNOSIS — M545 Low back pain: Secondary | ICD-10-CM

## 2015-11-06 DIAGNOSIS — R269 Unspecified abnormalities of gait and mobility: Secondary | ICD-10-CM

## 2015-11-06 NOTE — Therapy (Signed)
[facility] [address], [place], [place], [address] Phone: [phone]   Fax:  [phone]  Physical Therapy Treatment  Patient Details  Name: [name] MRN: [id number] Date of Birth: [birth year] Referring Provider: Dr. Latanya Maudlin  Encounter Date: 11/06/2015      PT End of Session - 11/06/15 1537    Visit Number 7   Number of Visits 10   Date for PT Re-Evaluation 12/10/15   PT Start Time 1525   PT Stop Time 1610   PT Time Calculation (min) 45 min   Activity Tolerance Patient tolerated treatment well   Behavior During Therapy [facility] for tasks assessed/performed      Past Medical History  Diagnosis Date  . Sliding hiatal hernia   . Erosive esophagitis   . Peptic stricture of esophagus   . Diverticulosis   . Arthritis   . Hypothyroidism   . Diabetes mellitus     Type I  . Sick sinus syndrome [facility])     s/p Medtronic DDD pacemaker implanted; generator change 02/2012  . Hypertension   . Hyperlipidemia   . GERD (gastroesophageal reflux disease)   . Adenomatous colon polyp 07/2002  . Obesity   . Paroxysmal atrial fibrillation ([place]) 02/04/15    discovered on PPM remote interrogation 6/16  . Coronary artery disease     s/p CABG 2001 x 5  . CVA (cerebral vascular accident) [facility]) 2013 or 2014    Dr Evelena Leyden, Neurology  . Dementia   . History of swelling of feet     props up at night, circulations has been checked inlegs in past and was told it was ok    Past Surgical History  Procedure Laterality Date  . Pacemaker placement  03/25/2000    by Dr Olevia Perches  . Tonsillectomy and adenoidectomy    . Appendectomy    . Cholecystectomy    . Total abdominal hysterectomy w/ bilateral salpingoophorectomy      Endometiosis  . Replacement total knee  2004    Right  . Colonoscopy w/ polypectomy  2003  . Cataract extraction  05/2005  . Coronary artery bypass graft  02/2000    5 vessel  . Rotator cuff repair Right 2009   . Permanent pacemaker generator change  03/18/2012    PPM gen change by Dr Rayann Heman  . Total knee arthroplasty Left 07/19/2015    Procedure: LEFT TOTAL KNEE ARTHROPLASTY;  Surgeon: Latanya Maudlin, MD;  [facility];  Service: Orthopedics;  Laterality: Left;    There were no vitals filed for this visit.  Visit Diagnosis:  Knee stiffness, left  Abnormality of gait  Weakness of left lower extremity  Swelling of limb  Weakness of both legs  Gait instability  Right low back pain, with sciatica presence unspecified      Subjective Assessment - 11/06/15 1536    Subjective I have been sitting all day. My knee is stiff. Doesn't bend knee when walking.   Currently in Pain? No/denies   Multiple Pain Sites No                         [facility] Adult PT Treatment/Exercise - 11/06/15 0001    Ambulation/Gait   Ambulation/Gait Yes   Ambulation/Gait Assistance 6: Modified independent (Device/Increase time)   Ambulation Distance (Feet) 100 Feet  100 feet x 2 vc's to incr hip flexion   Assistive device Rolling walker   Gait Pattern Decreased  step length - right;Decreased stance time - left;Decreased hip/knee flexion - left   Knee/Hip Exercises: Aerobic   Nustep L1 x 10 min   Knee/Hip Exercises: Seated   Long Arc Quad Strengthening;Left;2 sets;10 reps   Long Arc Quad Weight 2 lbs.   Ball Squeeze 10x   Other Seated Knee/Hip Exercises knee flex red band 2 x 10   Marching Limitations 2 x 10 bilat   Marching Weights 0 lbs.  could not lift with weights on   Sit to Sand 10 reps  5 x 5 with VC to use LE more than UE                  PT Short Term Goals - 11/06/15 1538    PT SHORT TERM GOAL #1   Title be independent in initial HEP   Time 4   Period Weeks   Status On-going  doing no exercises   PT SHORT TERM GOAL #2   Title left knee PROM >/= 100 degrees   Time 4   Period Weeks   Status On-going   PT SHORT TERM GOAL #3   Title left knee strength 4-/5 so  she can walk safely with walker   Time 4   Period Weeks   Status On-going  Unsteady           PT Long Term Goals - 10/31/15 6761    PT LONG TERM GOAL #1   Title be independent in advanced HEP   Time 8   Period Weeks   Status On-going   PT LONG TERM GOAL #2   Title FOTO score is </= 61% limitation   Time 8   Period Weeks   Status On-going   PT LONG TERM GOAL #3   Title Left knee flexion AROM >/= 105 degree to make it easier to walk   Time 8   Period Weeks   Status On-going   PT LONG TERM GOAL #4   Title left knee strength 4+/5 so patient is able to return home to live   Time 8   Period Weeks   Status On-going   PT LONG TERM GOAL #5   Title pain decreased >/= 50%    Time 8   Period Weeks   Status On-going               Plan - 11/06/15 1600    Clinical Impression Statement Pt very stiff today. Reports she has been sittin gall day. No new goal s met. Pt was able to get on Nustep successfully.   Pt will benefit from skilled therapeutic intervention in order to improve on the following deficits Abnormal gait;Decreased coordination;Difficulty walking;Decreased safety awareness;Decreased endurance;Decreased activity tolerance;Decreased balance;Decreased strength;Increased edema;Decreased mobility;Decreased scar mobility;Pain;Decreased range of motion   Rehab Potential Excellent   Clinical Impairments Affecting Rehab Potential None   PT Frequency 3x / week   PT Duration 8 weeks   PT Treatment/Interventions Moist Heat;Therapeutic activities;Patient/family education;Therapeutic exercise;Manual techniques;Balance training;Functional mobility training;Cryotherapy;Neuromuscular re-education;Gait training;Stair training;Passive range of motion   PT Next Visit Plan ROM, endurance strength, mobility   PT Home Exercise Plan Knee ROM, hamsting and gastroc stretch   Consulted and Agree with Plan of Care Patient        Problem List Patient Active Problem List   Diagnosis  Date Noted  . History of total knee arthroplasty 07/19/2015  . At risk for sleep apnea 07/17/2015  . Memory loss 04/05/2014  . Diabetes mellitus type 2 with  complications, uncontrolled ([place]) 01/24/2014  . Chronic renal insufficiency, stage II (mild) 03/21/2013  . Encounter for long-term (current) use of other medications 07/29/2012  . Diabetes ([place]) 04/28/2012  . CVA (cerebral vascular accident) ([place])   . EDEMA- LOCALIZED 01/06/2011  . Chronic systolic heart failure ([place]) 12/26/2010  . GERD 04/30/2010  . TRANSIENT ISCHEMIC ATTACKS, HX OF 04/30/2010  . DYSPEPSIA 04/14/2010  . WEAKNESS 03/19/2010  . CONSTIPATION 02/13/2010  . ANEMIA, MILD 01/14/2010  . Hyperlipidemia 09/16/2008  . HYPERTENSION, BENIGN 09/16/2008  . CAD, AUTOLOGOUS BYPASS GRAFT 09/16/2008  . SICK SINUS/ TACHY-BRADY SYNDROME 09/16/2008  . PACEMAKER, PERMANENT 09/16/2008  . Coronary atherosclerosis 10/25/2007  . Hypothyroidism 06/14/2007  . COLONIC POLYPS, HX OF 03/23/2007    Hershal Eriksson, PTA 11/06/2015, 4:09 PM  [facility] [address], [place], [place], [address] Phone: [phone]   Fax:  [phone]  Name: [name] MRN: [id number] Date of Birth: [birth year]

## 2015-11-11 ENCOUNTER — Ambulatory Visit: Payer: Medicare Other

## 2015-11-11 DIAGNOSIS — R29898 Other symptoms and signs involving the musculoskeletal system: Secondary | ICD-10-CM

## 2015-11-11 DIAGNOSIS — R2681 Unsteadiness on feet: Secondary | ICD-10-CM | POA: Diagnosis not present

## 2015-11-11 DIAGNOSIS — R269 Unspecified abnormalities of gait and mobility: Secondary | ICD-10-CM

## 2015-11-11 DIAGNOSIS — M545 Low back pain: Secondary | ICD-10-CM | POA: Diagnosis not present

## 2015-11-11 DIAGNOSIS — M25662 Stiffness of left knee, not elsewhere classified: Secondary | ICD-10-CM

## 2015-11-11 DIAGNOSIS — M7989 Other specified soft tissue disorders: Secondary | ICD-10-CM | POA: Diagnosis not present

## 2015-11-11 NOTE — Therapy (Signed)
[facility] [address], [place], [place], [address] Phone: [phone]   Fax:  [phone]  Physical Therapy Treatment  Patient Details  Name: [name] MRN: [id number] Date of Birth: Apr 19, 1925 Referring Provider: Dr. Latanya Maudlin  Encounter Date: 11/11/2015      PT End of Session - 11/11/15 1616    Visit Number 8   Number of Visits 10   Date for PT Re-Evaluation 12/10/15   PT Start Time 1534   PT Stop Time 1618   PT Time Calculation (min) 44 min   Activity Tolerance Patient tolerated treatment well   Behavior During Therapy [facility] for tasks assessed/performed      Past Medical History  Diagnosis Date  . Sliding hiatal hernia   . Erosive esophagitis   . Peptic stricture of esophagus   . Diverticulosis   . Arthritis   . Hypothyroidism   . Diabetes mellitus     Type I  . Sick sinus syndrome [facility])     s/p Medtronic DDD pacemaker implanted; generator change 02/2012  . Hypertension   . Hyperlipidemia   . GERD (gastroesophageal reflux disease)   . Adenomatous colon polyp 07/2002  . Obesity   . Paroxysmal atrial fibrillation ([place]) 02/04/15    discovered on PPM remote interrogation 6/16  . Coronary artery disease     s/p CABG 2001 x 5  . CVA (cerebral vascular accident) [name]) 2013 or 2014    Dr Evelena Leyden, Neurology  . Dementia   . History of swelling of feet     props up at night, circulations has been checked inlegs in past and was told it was ok    Past Surgical History  Procedure Laterality Date  . Pacemaker placement  03/25/2000    by Dr Olevia Perches  . Tonsillectomy and adenoidectomy    . Appendectomy    . Cholecystectomy    . Total abdominal hysterectomy w/ bilateral salpingoophorectomy      Endometiosis  . Replacement total knee  2004    Right  . Colonoscopy w/ polypectomy  2003  . Cataract extraction  05/2005  . Coronary artery bypass graft  02/2000    5 vessel  . Rotator cuff repair Right 2009   . Permanent pacemaker generator change  03/18/2012    PPM gen change by Dr Rayann Heman  . Total knee arthroplasty Left 07/19/2015    Procedure: LEFT TOTAL KNEE ARTHROPLASTY;  Surgeon: Latanya Maudlin, MD;  [facility];  Service: Orthopedics;  Laterality: Left;    There were no vitals filed for this visit.  Visit Diagnosis:  Knee stiffness, left  Abnormality of gait  Weakness of left lower extremity      Subjective Assessment - 11/11/15 1542    Subjective I hurt all over due to weather changes.     Currently in Pain? Yes   Pain Score 3    Pain Location Knee  all over   Pain Orientation Left   Pain Descriptors / Indicators Sore;Tightness   Pain Onset More than a month ago   Pain Frequency Intermittent   Aggravating Factors  only with moving espeically with getting in/out of the car or NuStep   Pain Relieving Factors not moving            [facility] PT Assessment - 11/11/15 0001    Precautions   Precautions ICD/Pacemaker   Precaution Comments Pace maker precautions   Strength   Overall Strength Deficits   Strength Assessment  Site Knee;Hip   Right/Left Hip Left   Left Hip Flexion 4-/5   Right/Left Knee Left   Left Knee Flexion 4-/5   Left Knee Extension 3+/5                     [facility] Adult PT Treatment/Exercise - 11/11/15 0001    Exercises   Exercises Ankle   Knee/Hip Exercises: Aerobic   Nustep Level 2 x 10 minutes  seat 8, arms 10   Knee/Hip Exercises: Seated   Long Arc Quad Strengthening;Left;2 sets;10 reps   Long Arc Quad Weight 2 lbs.   Ball Squeeze 20x   Abduction/Adduction  Both;2 sets;10 reps  with red t-band   Shoulder Exercises: Seated   Other Seated Exercises shoulder flexion 1# 2x10   Ankle Exercises: Seated   Other Seated Ankle Exercises seated rockerboard PF/DF x 3 minutes                  PT Short Term Goals - 11/06/15 1538    PT SHORT TERM GOAL #1   Title be independent in initial HEP   Time 4   Period Weeks   Status  On-going  doing no exercises   PT SHORT TERM GOAL #2   Title left knee PROM >/= 100 degrees   Time 4   Period Weeks   Status On-going   PT SHORT TERM GOAL #3   Title left knee strength 4-/5 so she can walk safely with walker   Time 4   Period Weeks   Status On-going  Unsteady           PT Long Term Goals - 11/11/15 1547    PT LONG TERM GOAL #1   Title be independent in advanced HEP   Time 8   Period Weeks   Status On-going   PT LONG TERM GOAL #5   Title pain decreased >/= 50%    Time 8   Period Weeks   Status On-going               Plan - 11/11/15 1548    Clinical Impression Statement Pt with widespread pain due to arthritis today.  Pt with slow mobility and pain with getting on/off of NuStep today.  Pt with dementia so limited compliance with HEP although she reports that she has been kicking her legs.  Pt will benefit from skilled PT for LE strength, endurance and gait activities to improve safety and endurance.     Pt will benefit from skilled therapeutic intervention in order to improve on the following deficits Abnormal gait;Decreased coordination;Difficulty walking;Decreased safety awareness;Decreased endurance;Decreased activity tolerance;Decreased balance;Decreased strength;Increased edema;Decreased mobility;Decreased scar mobility;Pain;Decreased range of motion   Rehab Potential Excellent   PT Frequency 3x / week   PT Duration 8 weeks   PT Treatment/Interventions Moist Heat;Therapeutic activities;Patient/family education;Therapeutic exercise;Manual techniques;Balance training;Functional mobility training;Cryotherapy;Neuromuscular re-education;Gait training;Stair training;Passive range of motion   PT Next Visit Plan ROM, endurance strength, mobility   Consulted and Agree with Plan of Care Patient        Problem List Patient Active Problem List   Diagnosis Date Noted  . History of total knee arthroplasty 07/19/2015  . At risk for sleep apnea 07/17/2015   . Memory loss 04/05/2014  . Diabetes mellitus type 2 with complications, uncontrolled ([place]) 01/24/2014  . Chronic renal insufficiency, stage II (mild) 03/21/2013  . Encounter for long-term (current) use of other medications 07/29/2012  . Diabetes ([place]) 04/28/2012  . CVA (cerebral  vascular accident) ([place])   . EDEMA- LOCALIZED 01/06/2011  . Chronic systolic heart failure ([place]) 12/26/2010  . GERD 04/30/2010  . TRANSIENT ISCHEMIC ATTACKS, HX OF 04/30/2010  . DYSPEPSIA 04/14/2010  . WEAKNESS 03/19/2010  . CONSTIPATION 02/13/2010  . ANEMIA, MILD 01/14/2010  . Hyperlipidemia 09/16/2008  . HYPERTENSION, BENIGN 09/16/2008  . CAD, AUTOLOGOUS BYPASS GRAFT 09/16/2008  . SICK SINUS/ TACHY-BRADY SYNDROME 09/16/2008  . PACEMAKER, PERMANENT 09/16/2008  . Coronary atherosclerosis 10/25/2007  . Hypothyroidism 06/14/2007  . COLONIC POLYPS, HX OF 03/23/2007    Constant Mandeville, PT 11/11/2015, 4:24 PM  [facility] [address] [place], [place], [address] Phone: [phone]   Fax:  [phone]  Name: [name] MRN: [id number] Date of Birth: [birth year]

## 2015-11-13 ENCOUNTER — Encounter: Payer: Self-pay | Admitting: Physical Therapy

## 2015-11-13 ENCOUNTER — Ambulatory Visit: Payer: Medicare Other | Admitting: Physical Therapy

## 2015-11-13 DIAGNOSIS — M545 Low back pain: Secondary | ICD-10-CM | POA: Diagnosis not present

## 2015-11-13 DIAGNOSIS — R29898 Other symptoms and signs involving the musculoskeletal system: Secondary | ICD-10-CM

## 2015-11-13 DIAGNOSIS — R269 Unspecified abnormalities of gait and mobility: Secondary | ICD-10-CM | POA: Diagnosis not present

## 2015-11-13 DIAGNOSIS — M7989 Other specified soft tissue disorders: Secondary | ICD-10-CM

## 2015-11-13 DIAGNOSIS — M25662 Stiffness of left knee, not elsewhere classified: Secondary | ICD-10-CM | POA: Diagnosis not present

## 2015-11-13 DIAGNOSIS — R2681 Unsteadiness on feet: Secondary | ICD-10-CM

## 2015-11-13 NOTE — Therapy (Signed)
[facility] [address] [place], [place], [address] Phone: [phone]   Fax:  [phone]  Physical Therapy Treatment  Patient Details  Name: [name] MRN: [id number] Date of Birth: July 07, 1925 Referring Provider: Dr. Latanya Maudlin  Encounter Date: 11/13/2015      PT End of Session - 11/13/15 1624    Visit Number 9   Number of Visits 10   Date for PT Re-Evaluation 12/10/15   PT Start Time [id number]   PT Stop Time 1656   PT Time Calculation (min) 35 min   Activity Tolerance Patient limited by pain   Behavior During Therapy [facility] for tasks assessed/performed      Past Medical History  Diagnosis Date  . Sliding hiatal hernia   . Erosive esophagitis   . Peptic stricture of esophagus   . Diverticulosis   . Arthritis   . Hypothyroidism   . Diabetes mellitus     Type I  . Sick sinus syndrome [facility])     s/p Medtronic DDD pacemaker implanted; generator change 02/2012  . Hypertension   . Hyperlipidemia   . GERD (gastroesophageal reflux disease)   . Adenomatous colon polyp 07/2002  . Obesity   . Paroxysmal atrial fibrillation ([place]) 02/04/15    discovered on PPM remote interrogation 6/16  . Coronary artery disease     s/p CABG 2001 x 5  . CVA (cerebral vascular accident) [facility]) 2013 or 2014    Dr Evelena Leyden, Neurology  . Dementia   . History of swelling of feet     props up at night, circulations has been checked inlegs in past and was told it was ok    Past Surgical History  Procedure Laterality Date  . Pacemaker placement  03/25/2000    by Dr Olevia Perches  . Tonsillectomy and adenoidectomy    . Appendectomy    . Cholecystectomy    . Total abdominal hysterectomy w/ bilateral salpingoophorectomy      Endometiosis  . Replacement total knee  2004    Right  . Colonoscopy w/ polypectomy  2003  . Cataract extraction  05/2005  . Coronary artery bypass graft  02/2000    5 vessel  . Rotator cuff repair Right 2009  .  Permanent pacemaker generator change  03/18/2012    PPM gen change by Dr Rayann Heman  . Total knee arthroplasty Left 07/19/2015    Procedure: LEFT TOTAL KNEE ARTHROPLASTY;  Surgeon: Latanya Maudlin, MD;  Location: [facility];  Service: Orthopedics;  Laterality: Left;    There were no vitals filed for this visit.  Visit Diagnosis:  Knee stiffness, left  Abnormality of gait  Weakness of left lower extremity  Swelling of limb  Weakness of both legs  Gait instability      Subjective Assessment - 11/13/15 1623    Subjective I haven't done much today. Daughter reports her mom fell Monday.    Currently in Pain? No/denies   Pain Location Knee   Pain Orientation Left   Pain Descriptors / Indicators Tightness   Multiple Pain Sites No                         [facility] Adult PT Treatment/Exercise - 11/13/15 0001    Ambulation/Gait   Ambulation Distance (Feet) 100 Feet   Assistive device Rolling walker   Gait Pattern Decreased step length - right;Decreased step length - left;Decreased stride length;Decreased hip/knee flexion - left;Decreased dorsiflexion - left  Knee/Hip Exercises: Aerobic   Nustep Level 2 x 10 minutes  seat 8, arms 10   Knee/Hip Exercises: Seated   Long Arc Quad Strengthening;Left;3 sets;10 reps   Long Arc Quad Weight 2 lbs.   Ball Squeeze 3x10   Shoulder Exercises: Seated   Other Seated Exercises shoulder flexion 1# 3x10   Ankle Exercises: Seated   Other Seated Ankle Exercises seated rockerboard PF/DF x 3 minutes                  PT Short Term Goals - 11/06/15 1538    PT SHORT TERM GOAL #1   Title be independent in initial HEP   Time 4   Period Weeks   Status On-going  doing no exercises   PT SHORT TERM GOAL #2   Title left knee PROM >/= 100 degrees   Time 4   Period Weeks   Status On-going   PT SHORT TERM GOAL #3   Title left knee strength 4-/5 so she can walk safely with walker   Time 4   Period Weeks   Status On-going  Unsteady            PT Long Term Goals - 11/11/15 1547    PT LONG TERM GOAL #1   Title be independent in advanced HEP   Time 8   Period Weeks   Status On-going   PT LONG TERM GOAL #5   Title pain decreased >/= 50%    Time 8   Period Weeks   Status On-going               Plan - 11/13/15 1624    Clinical Impression Statement Pt mobility more spry today. She still reports not doing much at home. Worked on quad weakness today, frequent VC for safty with the walker. She was able to add an extra set of work on [employer] today.    Pt will benefit from skilled therapeutic intervention in order to improve on the following deficits Abnormal gait;Decreased coordination;Difficulty walking;Decreased safety awareness;Decreased endurance;Decreased activity tolerance;Decreased balance;Decreased strength;Increased edema;Decreased mobility;Decreased scar mobility;Pain;Decreased range of motion   Rehab Potential Excellent   Clinical Impairments Affecting Rehab Potential None   PT Frequency 3x / week   PT Duration 8 weeks   PT Treatment/Interventions Moist Heat;Therapeutic activities;Patient/family education;Therapeutic exercise;Manual techniques;Balance training;Functional mobility training;Cryotherapy;Neuromuscular re-education;Gait training;Stair training;Passive range of motion   PT Next Visit Plan FOTO, 10th visit protocol next   Consulted and Agree with Plan of Care Patient        Problem List Patient Active Problem List   Diagnosis Date Noted  . History of total knee arthroplasty 07/19/2015  . At risk for sleep apnea 07/17/2015  . Memory loss 04/05/2014  . Diabetes mellitus type 2 with complications, uncontrolled ([place]) 01/24/2014  . Chronic renal insufficiency, stage II (mild) 03/21/2013  . Encounter for long-term (current) use of other medications 07/29/2012  . Diabetes ([place]) 04/28/2012  . CVA (cerebral vascular accident) ([place])   . EDEMA- LOCALIZED 01/06/2011  . Chronic systolic heart  failure ([place]) 12/26/2010  . GERD 04/30/2010  . TRANSIENT ISCHEMIC ATTACKS, HX OF 04/30/2010  . DYSPEPSIA 04/14/2010  . WEAKNESS 03/19/2010  . CONSTIPATION 02/13/2010  . ANEMIA, MILD 01/14/2010  . Hyperlipidemia 09/16/2008  . HYPERTENSION, BENIGN 09/16/2008  . CAD, AUTOLOGOUS BYPASS GRAFT 09/16/2008  . SICK SINUS/ TACHY-BRADY SYNDROME 09/16/2008  . PACEMAKER, PERMANENT 09/16/2008  . Coronary atherosclerosis 10/25/2007  . Hypothyroidism 06/14/2007  . COLONIC POLYPS, HX OF 03/23/2007  Lynnea Vandervoort, PTA 11/13/2015, 4:58 PM  [facility] [address] [place], [place], [address] Phone: [phone]   Fax:  [phone]  Name: MAKENNAH SHAMBAUGH MRN: [id number] Date of Birth: 1925/07/14

## 2015-11-19 ENCOUNTER — Encounter: Payer: Self-pay | Admitting: Physical Therapy

## 2015-11-19 ENCOUNTER — Ambulatory Visit: Payer: Medicare Other | Admitting: Physical Therapy

## 2015-11-19 DIAGNOSIS — M7989 Other specified soft tissue disorders: Secondary | ICD-10-CM | POA: Diagnosis not present

## 2015-11-19 DIAGNOSIS — R2681 Unsteadiness on feet: Secondary | ICD-10-CM | POA: Diagnosis not present

## 2015-11-19 DIAGNOSIS — R29898 Other symptoms and signs involving the musculoskeletal system: Secondary | ICD-10-CM

## 2015-11-19 DIAGNOSIS — M25662 Stiffness of left knee, not elsewhere classified: Secondary | ICD-10-CM | POA: Diagnosis not present

## 2015-11-19 DIAGNOSIS — M545 Low back pain: Secondary | ICD-10-CM | POA: Diagnosis not present

## 2015-11-19 DIAGNOSIS — R269 Unspecified abnormalities of gait and mobility: Secondary | ICD-10-CM | POA: Diagnosis not present

## 2015-11-19 NOTE — Therapy (Addendum)
[facility] [address], [place], [place], [address] Phone: [phone]   Fax:  [phone]  Physical Therapy Treatment  Patient Details  Name: [name] MRN: [id number] Date of Birth: [birth year] Referring Provider: Dr. Latanya Maudlin  Encounter Date: 11/19/2015      PT End of Session - 11/19/15 1623    Visit Number 10   Number of Visits 20   Date for PT Re-Evaluation 12/10/15   PT Start Time 1618   PT Stop Time 1700   PT Time Calculation (min) 42 min   Behavior During Therapy [facility] for tasks assessed/performed      Past Medical History  Diagnosis Date  . Sliding hiatal hernia   . Erosive esophagitis   . Peptic stricture of esophagus   . Diverticulosis   . Arthritis   . Hypothyroidism   . Diabetes mellitus     Type I  . Sick sinus syndrome [facility])     s/p Medtronic DDD pacemaker implanted; generator change 02/2012  . Hypertension   . Hyperlipidemia   . GERD (gastroesophageal reflux disease)   . Adenomatous colon polyp 07/2002  . Obesity   . Paroxysmal atrial fibrillation ([place]) 02/04/15    discovered on PPM remote interrogation 6/16  . Coronary artery disease     s/p CABG 2001 x 5  . CVA (cerebral vascular accident) [facility]) 2013 or 2014    Dr Evelena Leyden, Neurology  . Dementia   . History of swelling of feet     props up at night, circulations has been checked inlegs in past and was told it was ok    Past Surgical History  Procedure Laterality Date  . Pacemaker placement  03/25/2000    by Dr Olevia Perches  . Tonsillectomy and adenoidectomy    . Appendectomy    . Cholecystectomy    . Total abdominal hysterectomy w/ bilateral salpingoophorectomy      Endometiosis  . Replacement total knee  2004    Right  . Colonoscopy w/ polypectomy  2003  . Cataract extraction  05/2005  . Coronary artery bypass graft  02/2000    5 vessel  . Rotator cuff repair Right 2009  . Permanent pacemaker generator change  03/18/2012     PPM gen change by Dr Rayann Heman  . Total knee arthroplasty Left 07/19/2015    Procedure: LEFT TOTAL KNEE ARTHROPLASTY;  Surgeon: Latanya Maudlin, MD;  [facility];  Service: Orthopedics;  Laterality: Left;    There were no vitals filed for this visit.  Visit Diagnosis:  Knee stiffness, left  Abnormality of gait  Weakness of left lower extremity  Swelling of limb  Weakness of both legs  Gait instability      Subjective Assessment - 11/19/15 1632    Subjective Pt reports all over feeling improvement was able to place on her shoes today. Pt is very careful while walking with walker fo avoid falls.    Pertinent History Rt TKA, CVA   Limitations Standing   How long can you stand comfortably? difficult   How long can you walk comfortably? needs a rolling walker   Patient Stated Goals increase left knee flexion   Currently in Pain? No/denies            [facility] PT Assessment - 11/19/15 0001    Assessment   Referring Provider Dr. Latanya Maudlin   Precautions   Precautions ICD/Pacemaker   Precaution Comments Pace maker precautions   Balance Screen  Has the patient fallen in the past 6 months Yes   Is the patient reluctant to leave their home because of a fear of falling?  Yes   Observation/Other Assessments   Focus on Therapeutic Outcomes (FOTO)  74%   Strength   Overall Strength Deficits   Strength Assessment Site Knee;Hip   Right/Left Hip Left   Left Hip Flexion 4-/5   Right/Left Knee Left   Left Knee Flexion 4-/5   Left Knee Extension 4-/5                     [facility] Adult PT Treatment/Exercise - 11/19/15 0001    Ambulation/Gait   Ambulation/Gait Yes   Ambulation/Gait Assistance 6: Modified independent (Device/Increase time)   Ambulation Distance (Feet) 100 Feet   Assistive device Rolling walker   Gait Pattern Decreased step length - right;Decreased step length - left;Decreased stride length;Decreased hip/knee flexion - left;Decreased dorsiflexion -  left   Gait Comments cues to incr step length on right LE, and for staying inside walker   Exercises   Exercises Ankle   Knee/Hip Exercises: Aerobic   Nustep Level 2 x 14 minutes  seat#8, arms #10   Knee/Hip Exercises: Seated   Long Arc Quad Strengthening;Left;3 sets;10 reps   Long Arc Quad Weight 2 lbs.   Ball Squeeze 2x10   Shoulder Exercises: Seated   Other Seated Exercises shoulder flexion 1# 3x10   Ankle Exercises: Seated   Other Seated Ankle Exercises seated rockerboard PF/DF x 3 minutes                  PT Short Term Goals - 11/19/15 1631    PT SHORT TERM GOAL #1   Title be independent in initial HEP   Time 4   Period Weeks   Status On-going   PT SHORT TERM GOAL #2   Title left knee PROM >/= 100 degrees   Time 4   Period Weeks   Status On-going   PT SHORT TERM GOAL #3   Title left knee strength 4-/5 so she can walk safely with walker   Time 4   Period Weeks   Status On-going           PT Long Term Goals - 11/19/15 1631    PT LONG TERM GOAL #1   Title be independent in advanced HEP   Time 8   Period Weeks   Status On-going   PT LONG TERM GOAL #2   Title FOTO score is </= 61% limitation   Time 8   Period Weeks   Status On-going   PT LONG TERM GOAL #3   Title Left knee flexion AROM >/= 105 degree to make it easier to walk   Time 8   Period Weeks   Status On-going   PT LONG TERM GOAL #4   Title left knee strength 4+/5 so patient is able to return home to live   Time 8   Period Weeks   Status On-going   PT LONG TERM GOAL #5   Title pain decreased >/= 50%    Time 8   Period Weeks   Status On-going               Plan - 11/19/15 1626    Clinical Impression Statement Pt had a fall on Monday December 19. Pt with improved mobility, able to place on socks and shoes. FOTO score improved from 85% to 74% limitations. Pt ambulates with rolling walker  and step to gait pattern. Pt will continue to benefit from skilled PT to improve    Pt  will benefit from skilled therapeutic intervention in order to improve on the following deficits Abnormal gait;Decreased coordination;Difficulty walking;Decreased safety awareness;Decreased endurance;Decreased activity tolerance;Decreased balance;Decreased strength;Increased edema;Decreased mobility;Decreased scar mobility;Pain;Decreased range of motion   Rehab Potential Excellent   Clinical Impairments Affecting Rehab Potential None   PT Frequency 3x / week   PT Duration 8 weeks   PT Treatment/Interventions Moist Heat;Therapeutic activities;Patient/family education;Therapeutic exercise;Manual techniques;Balance training;Functional mobility training;Cryotherapy;Neuromuscular re-education;Gait training;Stair training;Passive range of motion   PT Next Visit Plan Continue to improve strength and endurance.    PT Home Exercise Plan current HEP   Consulted and Agree with Plan of Care Patient        Problem List Patient Active Problem List   Diagnosis Date Noted  . History of total knee arthroplasty 07/19/2015  . At risk for sleep apnea 07/17/2015  . Memory loss 04/05/2014  . Diabetes mellitus type 2 with complications, uncontrolled ([place]) 01/24/2014  . Chronic renal insufficiency, stage II (mild) 03/21/2013  . Encounter for long-term (current) use of other medications 07/29/2012  . Diabetes ([place]) 04/28/2012  . CVA (cerebral vascular accident) ([place])   . EDEMA- LOCALIZED 01/06/2011  . Chronic systolic heart failure ([place]) 12/26/2010  . GERD 04/30/2010  . TRANSIENT ISCHEMIC ATTACKS, HX OF 04/30/2010  . DYSPEPSIA 04/14/2010  . WEAKNESS 03/19/2010  . CONSTIPATION 02/13/2010  . ANEMIA, MILD 01/14/2010  . Hyperlipidemia 09/16/2008  . HYPERTENSION, BENIGN 09/16/2008  . CAD, AUTOLOGOUS BYPASS GRAFT 09/16/2008  . SICK SINUS/ TACHY-BRADY SYNDROME 09/16/2008  . PACEMAKER, PERMANENT 09/16/2008  . Coronary atherosclerosis 10/25/2007  . Hypothyroidism 06/14/2007  . COLONIC POLYPS, HX OF 03/23/2007     NAUMANN-HOUEGNIFIO,Mickle Campton PTA 11/19/2015, 5:19 PM Physical Therapy Progress Note  Dates of Reporting Period: 10/15/15 to 11/19/15  Objective Reports of Subjective Statement:  Pt reports that she is getting stronger.    Objective Measurements: See above.    Goal Update: See above for goal stats.   Plan: Continue gait, mobility and LE strength progression.    Reason Skilled Services are Required: Pt with limited, unsafe and slow mobility s/p TKR.  Pt is deconditioned and has unsteady gait.  Pt will benefit from continued LE strength, gait and balance training to improve safety of transfers and gait in the home and community.    [facility] [address], [place], [place], [address] Phone: [phone]   Fax:  [phone]  Name: [name] MRN: [id number] Date of Birth: 1925-08-23

## 2015-11-21 ENCOUNTER — Encounter: Payer: Self-pay | Admitting: Physical Therapy

## 2015-11-21 ENCOUNTER — Ambulatory Visit: Payer: Medicare Other | Admitting: Physical Therapy

## 2015-11-21 DIAGNOSIS — Z96652 Presence of left artificial knee joint: Secondary | ICD-10-CM | POA: Diagnosis not present

## 2015-11-21 DIAGNOSIS — R2681 Unsteadiness on feet: Secondary | ICD-10-CM

## 2015-11-21 DIAGNOSIS — M25662 Stiffness of left knee, not elsewhere classified: Secondary | ICD-10-CM

## 2015-11-21 DIAGNOSIS — M545 Low back pain: Secondary | ICD-10-CM

## 2015-11-21 DIAGNOSIS — R269 Unspecified abnormalities of gait and mobility: Secondary | ICD-10-CM

## 2015-11-21 DIAGNOSIS — Z471 Aftercare following joint replacement surgery: Secondary | ICD-10-CM | POA: Diagnosis not present

## 2015-11-21 DIAGNOSIS — M7989 Other specified soft tissue disorders: Secondary | ICD-10-CM

## 2015-11-21 DIAGNOSIS — R29898 Other symptoms and signs involving the musculoskeletal system: Secondary | ICD-10-CM | POA: Diagnosis not present

## 2015-11-21 DIAGNOSIS — M4806 Spinal stenosis, lumbar region: Secondary | ICD-10-CM | POA: Diagnosis not present

## 2015-11-21 NOTE — Therapy (Signed)
[facility] [address], [place], [place], [address] Phone: [phone]   Fax:  [phone]  Physical Therapy Treatment  Patient Details  Name: [name] MRN: [id number] Date of Birth: [birth year] Referring Provider: Dr. Latanya Maudlin  Encounter Date: 11/21/2015      PT End of Session - 11/21/15 1457    Visit Number 11   Number of Visits 20   Date for PT Re-Evaluation 12/10/15   PT Start Time [id number]   PT Stop Time 1530   PT Time Calculation (min) 45 min   Activity Tolerance Patient limited by pain   Behavior During Therapy [facility] for tasks assessed/performed      Past Medical History  Diagnosis Date  . Sliding hiatal hernia   . Erosive esophagitis   . Peptic stricture of esophagus   . Diverticulosis   . Arthritis   . Hypothyroidism   . Diabetes mellitus     Type I  . Sick sinus syndrome [facility])     s/p Medtronic DDD pacemaker implanted; generator change 02/2012  . Hypertension   . Hyperlipidemia   . GERD (gastroesophageal reflux disease)   . Adenomatous colon polyp 07/2002  . Obesity   . Paroxysmal atrial fibrillation ([place]) 02/04/15    discovered on PPM remote interrogation 6/16  . Coronary artery disease     s/p CABG 2001 x 5  . CVA (cerebral vascular accident) [facility]) 2013 or 2014    Dr Evelena Leyden, Neurology  . Dementia   . History of swelling of feet     props up at night, circulations has been checked inlegs in past and was told it was ok    Past Surgical History  Procedure Laterality Date  . Pacemaker placement  03/25/2000    by Dr Olevia Perches  . Tonsillectomy and adenoidectomy    . Appendectomy    . Cholecystectomy    . Total abdominal hysterectomy w/ bilateral salpingoophorectomy      Endometiosis  . Replacement total knee  2004    Right  . Colonoscopy w/ polypectomy  2003  . Cataract extraction  05/2005  . Coronary artery bypass graft  02/2000    5 vessel  . Rotator cuff repair Right 2009  .  Permanent pacemaker generator change  03/18/2012    PPM gen change by Dr Rayann Heman  . Total knee arthroplasty Left 07/19/2015    Procedure: LEFT TOTAL KNEE ARTHROPLASTY;  Surgeon: Latanya Maudlin, MD;  [facility];  Service: Orthopedics;  Laterality: Left;    There were no vitals filed for this visit.  Visit Diagnosis:  Knee stiffness, left  Abnormality of gait  Weakness of left lower extremity  Swelling of limb  Weakness of both legs  Gait instability  Right low back pain, with sciatica presence unspecified      Subjective Assessment - 11/21/15 1454    Subjective Pt reports feeling good with her knee, but needs to be evaluated for her back to be seen here in PT clinic   Patient is accompained by: Family member   Pertinent History Rt TKA, CVA   Limitations Standing   How long can you stand comfortably? difficult   How long can you walk comfortably? needs a rolling walker   Patient Stated Goals increase left knee flexion   Currently in Pain? No/denies   Multiple Pain Sites No  [facility] Adult PT Treatment/Exercise - 11/21/15 0001    Ambulation/Gait   Ambulation/Gait Yes   Ambulation/Gait Assistance 6: Modified independent (Device/Increase time)   Ambulation Distance (Feet) 100 Feet   Assistive device Rolling walker   Gait Pattern Decreased step length - right;Decreased step length - left;Decreased stride length;Decreased hip/knee flexion - left;Decreased dorsiflexion - left   Gait Comments cues to incr step length on right LE, and for staying inside walker   Exercises   Exercises Ankle   Knee/Hip Exercises: Aerobic   Nustep Level 2 x 10 minutes  seat#8, arms# 10   Knee/Hip Exercises: Standing   Hip Abduction Stengthening;2 sets;10 reps  4# each leg   Other Standing Knee Exercises Hamstring curls 2# Lt Le & 4# Rt LE   Knee/Hip Exercises: Seated   Long Arc Quad Strengthening;Left;3 sets;10 reps   Long Arc Quad Weight 4 lbs.  each  leg, challenging with Lt LE   Ball Squeeze 2x10  with 5 sec hold   Marching Weights --  marching 2# Lt and 4# Rt, unable to perform with 4# on Lt    Shoulder Exercises: Seated   Other Seated Exercises shoulder 3 way raises 1# 2x10                  PT Short Term Goals - 11/19/15 1631    PT SHORT TERM GOAL #1   Title be independent in initial HEP   Time 4   Period Weeks   Status On-going   PT SHORT TERM GOAL #2   Title left knee PROM >/= 100 degrees   Time 4   Period Weeks   Status On-going   PT SHORT TERM GOAL #3   Title left knee strength 4-/5 so she can walk safely with walker   Time 4   Period Weeks   Status On-going           PT Long Term Goals - 11/19/15 1631    PT LONG TERM GOAL #1   Title be independent in advanced HEP   Time 8   Period Weeks   Status On-going   PT LONG TERM GOAL #2   Title FOTO score is </= 61% limitation   Time 8   Period Weeks   Status On-going   PT LONG TERM GOAL #3   Title Left knee flexion AROM >/= 105 degree to make it easier to walk   Time 8   Period Weeks   Status On-going   PT LONG TERM GOAL #4   Title left knee strength 4+/5 so patient is able to return home to live   Time 8   Period Weeks   Status On-going   PT LONG TERM GOAL #5   Title pain decreased >/= 50%    Time 8   Period Weeks   Status On-going               Plan - 11/21/15 1504    Clinical Impression Statement Pt was able to incr weight with LAQ's. Pt needs constant reminders for walker placement, decreased endurance, decreased activity level, decreased balance. Pt will continue to benefit from skilled PT to improve strength and gait for safety    Pt will benefit from skilled therapeutic intervention in order to improve on the following deficits Abnormal gait;Decreased coordination;Difficulty walking;Decreased safety awareness;Decreased endurance;Decreased activity tolerance;Decreased balance;Decreased strength;Increased edema;Decreased  mobility;Decreased scar mobility;Pain;Decreased range of motion   Rehab Potential Excellent   Clinical Impairments Affecting Rehab Potential None  PT Frequency 3x / week   PT Duration 8 weeks   PT Treatment/Interventions Moist Heat;Therapeutic activities;Patient/family education;Therapeutic exercise;Manual techniques;Balance training;Functional mobility training;Cryotherapy;Neuromuscular re-education;Gait training;Stair training;Passive range of motion   PT Next Visit Plan Continue to improve strength and endurance.    PT Home Exercise Plan current HEP   Consulted and Agree with Plan of Care Patient        Problem List Patient Active Problem List   Diagnosis Date Noted  . History of total knee arthroplasty 07/19/2015  . At risk for sleep apnea 07/17/2015  . Memory loss 04/05/2014  . Diabetes mellitus type 2 with complications, uncontrolled ([place]) 01/24/2014  . Chronic renal insufficiency, stage II (mild) 03/21/2013  . Encounter for long-term (current) use of other medications 07/29/2012  . Diabetes ([place]) 04/28/2012  . CVA (cerebral vascular accident) ([place])   . EDEMA- LOCALIZED 01/06/2011  . Chronic systolic heart failure ([place]) 12/26/2010  . GERD 04/30/2010  . TRANSIENT ISCHEMIC ATTACKS, HX OF 04/30/2010  . DYSPEPSIA 04/14/2010  . WEAKNESS 03/19/2010  . CONSTIPATION 02/13/2010  . ANEMIA, MILD 01/14/2010  . Hyperlipidemia 09/16/2008  . HYPERTENSION, BENIGN 09/16/2008  . CAD, AUTOLOGOUS BYPASS GRAFT 09/16/2008  . SICK SINUS/ TACHY-BRADY SYNDROME 09/16/2008  . PACEMAKER, PERMANENT 09/16/2008  . Coronary atherosclerosis 10/25/2007  . Hypothyroidism 06/14/2007  . COLONIC POLYPS, HX OF 03/23/2007    NAUMANN-HOUEGNIFIO,Hadley [place] PTA 11/21/2015, 3:39 PM  [facility] [address] [place], [place], [address] Phone: [phone]   Fax:  [phone]  Name: [name] MRN: [id number] Date of Birth: [birth year]

## 2015-11-28 ENCOUNTER — Ambulatory Visit: Payer: Medicare Other | Attending: Orthopedic Surgery | Admitting: Physical Therapy

## 2015-11-28 ENCOUNTER — Encounter: Payer: Self-pay | Admitting: Physical Therapy

## 2015-11-28 DIAGNOSIS — R29898 Other symptoms and signs involving the musculoskeletal system: Secondary | ICD-10-CM | POA: Insufficient documentation

## 2015-11-28 DIAGNOSIS — M7989 Other specified soft tissue disorders: Secondary | ICD-10-CM | POA: Insufficient documentation

## 2015-11-28 DIAGNOSIS — M25662 Stiffness of left knee, not elsewhere classified: Secondary | ICD-10-CM | POA: Diagnosis not present

## 2015-11-28 DIAGNOSIS — R2681 Unsteadiness on feet: Secondary | ICD-10-CM | POA: Insufficient documentation

## 2015-11-28 DIAGNOSIS — M545 Low back pain: Secondary | ICD-10-CM | POA: Insufficient documentation

## 2015-11-28 DIAGNOSIS — R269 Unspecified abnormalities of gait and mobility: Secondary | ICD-10-CM

## 2015-11-28 NOTE — Therapy (Signed)
[facility] [address], [place], [place], [address] Phone: [phone]   Fax:  [phone]  Physical Therapy Treatment  Patient Details  Name: [name] MRN: [id number] Date of Birth: 1925-06-01 Referring Provider: Dr. Latanya Maudlin  Encounter Date: 11/28/2015      PT End of Session - 11/28/15 1644    Visit Number 12   Number of Visits 20   Date for PT Re-Evaluation 12/10/15   PT Start Time [id number]   PT Stop Time 1658   PT Time Calculation (min) 44 min   Activity Tolerance Patient tolerated treatment well   Behavior During Therapy [facility] for tasks assessed/performed      Past Medical History  Diagnosis Date  . Sliding hiatal hernia   . Erosive esophagitis   . Peptic stricture of esophagus   . Diverticulosis   . Arthritis   . Hypothyroidism   . Diabetes mellitus     Type I  . Sick sinus syndrome [facility])     s/p Medtronic DDD pacemaker implanted; generator change 02/2012  . Hypertension   . Hyperlipidemia   . GERD (gastroesophageal reflux disease)   . Adenomatous colon polyp 07/2002  . Obesity   . Paroxysmal atrial fibrillation ([place]) 02/04/15    discovered on PPM remote interrogation 6/16  . Coronary artery disease     s/p CABG 2001 x 5  . CVA (cerebral vascular accident) [facility]) 2013 or 2014    Dr Evelena Leyden, Neurology  . Dementia   . History of swelling of feet     props up at night, circulations has been checked inlegs in past and was told it was ok    Past Surgical History  Procedure Laterality Date  . Pacemaker placement  03/25/2000    by Dr Olevia Perches  . Tonsillectomy and adenoidectomy    . Appendectomy    . Cholecystectomy    . Total abdominal hysterectomy w/ bilateral salpingoophorectomy      Endometiosis  . Replacement total knee  2004    Right  . Colonoscopy w/ polypectomy  2003  . Cataract extraction  05/2005  . Coronary artery bypass graft  02/2000    5 vessel  . Rotator cuff repair Right 2009   . Permanent pacemaker generator change  03/18/2012    PPM gen change by Dr Rayann Heman  . Total knee arthroplasty Left 07/19/2015    Procedure: LEFT TOTAL KNEE ARTHROPLASTY;  Surgeon: Latanya Maudlin, MD;  Location: [facility];  Service: Orthopedics;  Laterality: Left;    There were no vitals filed for this visit.  Visit Diagnosis:  Knee stiffness, left  Abnormality of gait  Weakness of left lower extremity  Swelling of limb  Weakness of both legs  Gait instability      Subjective Assessment - 11/28/15 1641    Subjective Pt reports she feelsmore confidence with the ambulation with the rolling walker and more stability in left knee.    Patient is accompained by: Family member   Pertinent History Rt TKA, CVA   Limitations Standing   How long can you stand comfortably? difficult   How long can you walk comfortably? needs a rolling walker   Patient Stated Goals increase left knee flexion   Currently in Pain? No/denies   Multiple Pain Sites No                         [facility] Adult PT Treatment/Exercise - 11/28/15 0001  Exercises   Exercises Knee/Hip   Knee/Hip Exercises: Aerobic   Nustep Level 2 x 10 minutes  seat#8, arms#10   Knee/Hip Exercises: Standing   Hip Abduction Stengthening;2 sets;10 reps  4# each leg   Other Standing Knee Exercises Hamstring curls 2# Lt Le & 4# Rt LE   Other Standing Knee Exercises Partial squatting 3 x 5 with B UE support in available range   Knee/Hip Exercises: Seated   Long Arc Quad Strengthening;Left;3 sets;10 reps   Long Arc Quad Weight 4 lbs.   Ball Squeeze 2x10  with blue ball and 5 sec hold   Marching Strengthening;Both;2 sets;10 reps  4# added   Shoulder Exercises: Seated   Other Seated Exercises shoulder 3 way raises 1# 2x10                  PT Short Term Goals - 11/28/15 1656    PT SHORT TERM GOAL #1   Title be independent in initial HEP   Time 4   Period Weeks   Status Achieved   PT SHORT TERM GOAL #2    Title left knee PROM >/= 100 degrees   Time 4   Period Weeks   Status On-going   PT SHORT TERM GOAL #3   Title left knee strength 4-/5 so she can walk safely with walker   Time 4   Period Weeks   Status On-going           PT Long Term Goals - 11/19/15 1631    PT LONG TERM GOAL #1   Title be independent in advanced HEP   Time 8   Period Weeks   Status On-going   PT LONG TERM GOAL #2   Title FOTO score is </= 61% limitation   Time 8   Period Weeks   Status On-going   PT LONG TERM GOAL #3   Title Left knee flexion AROM >/= 105 degree to make it easier to walk   Time 8   Period Weeks   Status On-going   PT LONG TERM GOAL #4   Title left knee strength 4+/5 so patient is able to return home to live   Time 8   Period Weeks   Status On-going   PT LONG TERM GOAL #5   Title pain decreased >/= 50%    Time 8   Period Weeks   Status On-going               Plan - 11/28/15 1646    Clinical Impression Statement Pt was able to perform the LAQ's with 4# and performed partiall squatting with B UE support and close supervision for safety. Pt will continue to benefit from skilled PT to improve left knee stability, strength and improve balance.     Pt will benefit from skilled therapeutic intervention in order to improve on the following deficits Abnormal gait;Decreased coordination;Difficulty walking;Decreased safety awareness;Decreased endurance;Decreased activity tolerance;Decreased balance;Decreased strength;Increased edema;Decreased mobility;Decreased scar mobility;Pain;Decreased range of motion   Rehab Potential Excellent   Clinical Impairments Affecting Rehab Potential None   PT Frequency 3x / week   PT Duration 8 weeks   PT Treatment/Interventions Moist Heat;Therapeutic activities;Patient/family education;Therapeutic exercise;Manual techniques;Balance training;Functional mobility training;Cryotherapy;Neuromuscular re-education;Gait training;Stair training;Passive range  of motion   PT Next Visit Plan Continue to improve strength and endurance.    PT Home Exercise Plan current HEP   Consulted and Agree with Plan of Care Patient        Problem List Patient  Active Problem List   Diagnosis Date Noted  . History of total knee arthroplasty 07/19/2015  . At risk for sleep apnea 07/17/2015  . Memory loss 04/05/2014  . Diabetes mellitus type 2 with complications, uncontrolled ([place]) 01/24/2014  . Chronic renal insufficiency, stage II (mild) 03/21/2013  . Encounter for long-term (current) use of other medications 07/29/2012  . Diabetes ([place]) 04/28/2012  . CVA (cerebral vascular accident) ([place])   . EDEMA- LOCALIZED 01/06/2011  . Chronic systolic heart failure ([place]) 12/26/2010  . GERD 04/30/2010  . TRANSIENT ISCHEMIC ATTACKS, HX OF 04/30/2010  . DYSPEPSIA 04/14/2010  . WEAKNESS 03/19/2010  . CONSTIPATION 02/13/2010  . ANEMIA, MILD 01/14/2010  . Hyperlipidemia 09/16/2008  . HYPERTENSION, BENIGN 09/16/2008  . CAD, AUTOLOGOUS BYPASS GRAFT 09/16/2008  . SICK SINUS/ TACHY-BRADY SYNDROME 09/16/2008  . PACEMAKER, PERMANENT 09/16/2008  . Coronary atherosclerosis 10/25/2007  . Hypothyroidism 06/14/2007  . COLONIC POLYPS, HX OF 03/23/2007    NAUMANN-HOUEGNIFIO,Binnie Vonderhaar PTA 11/28/2015, 5:04 PM  [facility] [address] [place], [place], [address] Phone: [phone]   Fax:  [phone]  Name: YOULANDA TAMMINGA MRN: [id number] Date of Birth: 21-Mar-1925

## 2015-12-02 ENCOUNTER — Telehealth: Payer: Self-pay | Admitting: Cardiology

## 2015-12-02 ENCOUNTER — Ambulatory Visit (INDEPENDENT_AMBULATORY_CARE_PROVIDER_SITE_OTHER): Payer: Medicare Other | Admitting: *Deleted

## 2015-12-02 ENCOUNTER — Encounter: Payer: Medicare Other | Admitting: Physical Therapy

## 2015-12-02 DIAGNOSIS — I495 Sick sinus syndrome: Secondary | ICD-10-CM | POA: Diagnosis not present

## 2015-12-02 NOTE — Telephone Encounter (Signed)
LMOVM reminding pt to send remote transmission.   

## 2015-12-03 NOTE — Progress Notes (Signed)
Remote pacemaker transmission.   

## 2015-12-04 ENCOUNTER — Encounter: Payer: Self-pay | Admitting: Physical Therapy

## 2015-12-04 ENCOUNTER — Ambulatory Visit: Payer: Medicare Other | Admitting: Physical Therapy

## 2015-12-04 DIAGNOSIS — R269 Unspecified abnormalities of gait and mobility: Secondary | ICD-10-CM

## 2015-12-04 DIAGNOSIS — M25662 Stiffness of left knee, not elsewhere classified: Secondary | ICD-10-CM | POA: Diagnosis not present

## 2015-12-04 DIAGNOSIS — M545 Low back pain: Secondary | ICD-10-CM

## 2015-12-04 DIAGNOSIS — R29898 Other symptoms and signs involving the musculoskeletal system: Secondary | ICD-10-CM

## 2015-12-04 NOTE — Therapy (Signed)
[facility] [address] [place], [place], [address] Phone: [phone]   Fax:  [phone]  Physical Therapy Evaluation  Patient Details  Name: [name] MRN: [id number] Date of Birth: August 15, 1925 Referring Provider: Dr. Latanya Maudlin  Encounter Date: 12/04/2015      PT End of Session - 12/04/15 1602    Visit Number 13   Number of Visits 20   Date for PT Re-Evaluation 01/29/16   PT Start Time [id number]   PT Stop Time 1615   PT Time Calculation (min) 45 min   Activity Tolerance Patient tolerated treatment well   Behavior During Therapy [facility] for tasks assessed/performed      Past Medical History  Diagnosis Date  . Sliding hiatal hernia   . Erosive esophagitis   . Peptic stricture of esophagus   . Diverticulosis   . Arthritis   . Hypothyroidism   . Diabetes mellitus     Type I  . Sick sinus syndrome [facility])     s/p Medtronic DDD pacemaker implanted; generator change 02/2012  . Hypertension   . Hyperlipidemia   . GERD (gastroesophageal reflux disease)   . Adenomatous colon polyp 07/2002  . Obesity   . Paroxysmal atrial fibrillation ([place]) 02/04/15    discovered on PPM remote interrogation 6/16  . Coronary artery disease     s/p CABG 2001 x 5  . CVA (cerebral vascular accident) [facility]) 2013 or 2014    Dr Evelena Leyden, Neurology  . Dementia   . History of swelling of feet     props up at night, circulations has been checked inlegs in past and was told it was ok    Past Surgical History  Procedure Laterality Date  . Pacemaker placement  03/25/2000    by Dr Olevia Perches  . Tonsillectomy and adenoidectomy    . Appendectomy    . Cholecystectomy    . Total abdominal hysterectomy w/ bilateral salpingoophorectomy      Endometiosis  . Replacement total knee  2004    Right  . Colonoscopy w/ polypectomy  2003  . Cataract extraction  05/2005  . Coronary artery bypass graft  02/2000    5 vessel  . Rotator cuff repair Right 2009   . Permanent pacemaker generator change  03/18/2012    PPM gen change by Dr Rayann Heman  . Total knee arthroplasty Left 07/19/2015    Procedure: LEFT TOTAL KNEE ARTHROPLASTY;  Surgeon: Latanya Maudlin, MD;  [facility];  Service: Orthopedics;  Laterality: Left;    There were no vitals filed for this visit.  Visit Diagnosis:  Knee stiffness, left - Plan: PT plan of care cert/re-cert  Abnormality of gait - Plan: PT plan of care cert/re-cert  Weakness of left lower extremity - Plan: PT plan of care cert/re-cert  Right low back pain, with sciatica presence unspecified - Plan: PT plan of care cert/re-cert      Subjective Assessment - 12/04/15 1540    Subjective Patient reports her lower back hurts at times. Patient reports her knee is better. Patient reports her knee pain is 50% better. Patient reports chronic back pain for the last 6 months. Patient reports she has been doing some exercises and has helped her back.    Patient is accompained by: Family member   Pertinent History Rt TKA, CVA   Limitations Standing   How long can you stand comfortably? difficult   How long can you walk comfortably? needs a rolling walker  Patient Stated Goals increase left knee flexion; reduce pain   Currently in Pain? Yes   Pain Score 2    Pain Location Knee   Pain Orientation Left   Pain Descriptors / Indicators Tightness   Pain Type Surgical pain   Pain Onset More than a month ago   Pain Frequency Intermittent   Aggravating Factors  only when touch it.    Pain Relieving Factors not moving   Multiple Pain Sites Yes   Pain Score 2   Pain Location Back   Pain Orientation Mid   Pain Descriptors / Indicators Discomfort   Pain Type Chronic pain   Pain Onset More than a month ago   Pain Frequency Intermittent   Aggravating Factors  picking up things   Pain Relieving Factors rest            [facility] PT Assessment - 12/04/15 0001    Assessment   Medical Diagnosis Z96.652 S/P total left knee  replacement; Spinal stenosis of lumbar   Referring Provider Dr. Latanya Maudlin   Onset Date/Surgical Date 07/18/15   Precautions   Precautions ICD/Pacemaker   Precaution Comments Pace maker precautions   Balance Screen   Has the patient fallen in the past 6 months Yes  let go of walker while going to sit down   How many times? 1   Has the patient had a decrease in activity level because of a fear of falling?  No   Is the patient reluctant to leave their home because of a fear of falling?  No   Home Ec[employer] residence   Prior Function   Level of Independence Independent with household mobility with device   Cognition   Overall Cognitive Status Within Functional Limits for tasks assessed   Observation/Other Assessments   Focus on Therapeutic Outcomes (FOTO)  80% limitation    Posture/Postural Control   Posture/Postural Control Postural limitations   Postural Limitations Rounded Shoulders;Forward head;Flexed trunk   ROM / Strength   AROM / PROM / Strength AROM   AROM   Overall AROM Comments lumbar ROM decreased by 75%   Right/Left Knee Left   Left Knee Extension -10   Left Knee Flexion 88   PROM   Right/Left Knee Left   Left Knee Extension -8   Left Knee Flexion 92   Strength   Overall Strength Deficits   Left Hip Flexion 4-/5   Left Knee Flexion 4-/5   Left Knee Extension 4-/5   Palpation   Palpation comment tenderness located in lower lumbar paraspinals   Ambulation/Gait   Ambulation/Gait Yes   Ambulation/Gait Assistance 6: Modified independent (Device/Increase time)   Ambulation Distance (Feet) 100 Feet   Assistive device Rolling walker   Gait Pattern Decreased step length - right;Decreased step length - left;Decreased stride length;Decreased hip/knee flexion - left;Decreased dorsiflexion - left   Timed Up and Go Test   TUG Normal TUG   Normal TUG (seconds) 65  used a rollator walker                   [facility] Adult PT  Treatment/Exercise - 12/04/15 0001    Knee/Hip Exercises: Seated   Long Arc Quad Strengthening;Left;3 sets;10 reps   Long Arc Quad Weight 4 lbs.   Ball Squeeze 2x10  with blue ball and 5 sec hold                PT Education - 12/04/15 [id number]  Education provided No          PT Short Term Goals - 11/28/15 1656    PT SHORT TERM GOAL #1   Title be independent in initial HEP   Time 4   Period Weeks   Status Achieved   PT SHORT TERM GOAL #2   Title left knee PROM >/= 100 degrees   Time 4   Period Weeks   Status On-going   PT SHORT TERM GOAL #3   Title left knee strength 4-/5 so she can walk safely with walker   Time 4   Period Weeks   Status On-going           PT Long Term Goals - 12/04/15 1610    PT LONG TERM GOAL #1   Title be independent in advanced HEP   Time 8   Period Weeks   Status On-going   PT LONG TERM GOAL #2   Title FOTO score is </= 61% limitation for knee   Time 8   Period Weeks   Status On-going   PT LONG TERM GOAL #3   Title Left knee flexion AROM >/= 105 degree to make it easier to walk   Time 8   Period Weeks   Status On-going  88 degrees   PT LONG TERM GOAL #4   Title left knee strength 4+/5 so patient is able to return home to live   Time 8   Period Weeks   Status On-going  4-/5   PT LONG TERM GOAL #5   Title pain decreased >/= 50%    Time 8   Period Weeks   Status Achieved   Additional Long Term Goals   Additional Long Term Goals Yes   PT LONG TERM GOAL #6   Title back pain with daily activities decresae >/= 50%    Time 8   Period Weeks   Status New   PT LONG TERM GOAL #7   Title TUG </= 50 seconds due to increased strength and endurance with rolling walker    Time 8   Period Weeks   Status New   PT LONG TERM GOAL #8   Title getting up and down from chair with 1 try due to increased strength and decreased pain with using her hands.    Time 8   Period Weeks   Status New               Plan - 12/04/15  1603    Clinical Impression Statement Patient is a 82 years old with diagnosis of left TKR and added lumbar stenosis.  Patient was re-evaluated for the lumbar pain that started on 07/17/2016 after her TKR du eto her walking awkardly.  FOTO score for back is 80% limitaiton.  Lumbar pain is 2/10 intermittently with sudden onset.  Lumbar ROM is decreased by 75%.  TUG score is 65 seconds using a rolling walker with supervision.  Left knee AROM is -10--88 degrees.  Left knee PROM is -8-92 degrees.  Patient has to sit with her left knee slightly flexed due to decresaed left knee flexion.  Left knee strength is 4-/5.  Patient ambulates with a rolling walker with decresaed step length, flexed trunk, decreased weight shift onto left leg, and leans to the right.  Patient ambulates very slowly and makes wide turns to turn a different direction.  Patient reports she fell 2 weeks ago due to going from walker to a chair. Patient would benefit from phsycial  therapy to work on left knee ROM and strength, gait, back pain, and balance.     Pt will benefit from skilled therapeutic intervention in order to improve on the following deficits Pain;Decreased strength;Decreased mobility;Decreased balance;Decreased activity tolerance;Decreased endurance;Difficulty walking;Decreased range of motion;Abnormal gait   Rehab Potential Good   PT Frequency 3x / week   PT Duration 8 weeks   PT Treatment/Interventions Moist Heat;Therapeutic activities;Patient/family education;Therapeutic exercise;Manual techniques;Balance training;Functional mobility training;Cryotherapy;Neuromuscular re-education;Gait training;Stair training;Passive range of motion   PT Next Visit Plan work on back pain, upright posture, left knee ROM and strength, balance   PT Home Exercise Plan add gentle back strengthening   Recommended Other Services None   Consulted and Agree with Plan of Care Patient          G-Codes - 12/10/15 1614    Functional Assessment Tool  Used FOTO score for back is 80% limitation  1 time for back    Functional Limitation Self care   Self Care Current Status [id number]) At least 80 percent but less than 100 percent impaired, limited or restricted   Self Care Goal Status [id number]) At least 80 percent but less than 100 percent impaired, limited or restricted   Self Care Discharge Status [phone]) At least 80 percent but less than 100 percent impaired, limited or restricted       Problem List Patient Active Problem List   Diagnosis Date Noted  . History of total knee arthroplasty 07/19/2015  . At risk for sleep apnea 07/17/2015  . Memory loss 04/05/2014  . Diabetes mellitus type 2 with complications, uncontrolled ([place]) 01/24/2014  . Chronic renal insufficiency, stage II (mild) 03/21/2013  . Encounter for long-term (current) use of other medications 07/29/2012  . Diabetes ([place]) 04/28/2012  . CVA (cerebral vascular accident) ([place])   . EDEMA- LOCALIZED 01/06/2011  . Chronic systolic heart failure ([place]) 12/26/2010  . GERD 04/30/2010  . TRANSIENT ISCHEMIC ATTACKS, HX OF 04/30/2010  . DYSPEPSIA 04/14/2010  . WEAKNESS 03/19/2010  . CONSTIPATION 02/13/2010  . ANEMIA, MILD 01/14/2010  . Hyperlipidemia 09/16/2008  . HYPERTENSION, BENIGN 09/16/2008  . CAD, AUTOLOGOUS BYPASS GRAFT 09/16/2008  . SICK SINUS/ TACHY-BRADY SYNDROME 09/16/2008  . PACEMAKER, PERMANENT 09/16/2008  . Coronary atherosclerosis 10/25/2007  . Hypothyroidism 06/14/2007  . COLONIC POLYPS, HX OF 03/23/2007    Earlie Counts, PT 2015-12-10 4:19 PM   [facility] [address] [place], [place], [address] Phone: [phone]   Fax:  [phone]  Name: [name] MRN: [id number] Date of Birth: [birth year]

## 2015-12-05 ENCOUNTER — Other Ambulatory Visit: Payer: Self-pay | Admitting: Internal Medicine

## 2015-12-05 ENCOUNTER — Telehealth: Payer: Self-pay

## 2015-12-05 NOTE — Telephone Encounter (Signed)
Talked with patients daughter, gail---i have scheduled appt for feb 7th to get established with new pcp--dr burns---30 day supply of protonix has been sent to patient pharm

## 2015-12-09 ENCOUNTER — Ambulatory Visit: Payer: Medicare Other | Admitting: Physical Therapy

## 2015-12-09 ENCOUNTER — Encounter: Payer: Self-pay | Admitting: Physical Therapy

## 2015-12-09 DIAGNOSIS — R29898 Other symptoms and signs involving the musculoskeletal system: Secondary | ICD-10-CM

## 2015-12-09 DIAGNOSIS — R2681 Unsteadiness on feet: Secondary | ICD-10-CM

## 2015-12-09 DIAGNOSIS — M25662 Stiffness of left knee, not elsewhere classified: Secondary | ICD-10-CM

## 2015-12-09 DIAGNOSIS — M545 Low back pain: Secondary | ICD-10-CM

## 2015-12-09 DIAGNOSIS — R269 Unspecified abnormalities of gait and mobility: Secondary | ICD-10-CM

## 2015-12-09 NOTE — Therapy (Signed)
[facility] [address], [place], [place], [address] Phone: [phone]   Fax:  [phone]  Physical Therapy Treatment  Patient Details  Name: [name] MRN: [id number] Date of Birth: August 08, 1925 Referring Provider: Dr. Latanya Maudlin  Encounter Date: 12/09/2015      PT End of Session - 12/09/15 1634    Visit Number 14   Number of Visits 20   Date for PT Re-Evaluation 01/29/16   PT Start Time 1610   PT Stop Time 1655   PT Time Calculation (min) 45 min   Activity Tolerance Patient tolerated treatment well   Behavior During Therapy [facility] for tasks assessed/performed      Past Medical History  Diagnosis Date  . Sliding hiatal hernia   . Erosive esophagitis   . Peptic stricture of esophagus   . Diverticulosis   . Arthritis   . Hypothyroidism   . Diabetes mellitus     Type I  . Sick sinus syndrome [facility])     s/p Medtronic DDD pacemaker implanted; generator change 02/2012  . Hypertension   . Hyperlipidemia   . GERD (gastroesophageal reflux disease)   . Adenomatous colon polyp 07/2002  . Obesity   . Paroxysmal atrial fibrillation ([place]) 02/04/15    discovered on PPM remote interrogation 6/16  . Coronary artery disease     s/p CABG 2001 x 5  . CVA (cerebral vascular accident) [facility]) 2013 or 2014    Dr Evelena Leyden, Neurology  . Dementia   . History of swelling of feet     props up at night, circulations has been checked inlegs in past and was told it was ok    Past Surgical History  Procedure Laterality Date  . Pacemaker placement  03/25/2000    by Dr Olevia Perches  . Tonsillectomy and adenoidectomy    . Appendectomy    . Cholecystectomy    . Total abdominal hysterectomy w/ bilateral salpingoophorectomy      Endometiosis  . Replacement total knee  2004    Right  . Colonoscopy w/ polypectomy  2003  . Cataract extraction  05/2005  . Coronary artery bypass graft  02/2000    5 vessel  . Rotator cuff repair Right 2009   . Permanent pacemaker generator change  03/18/2012    PPM gen change by Dr Rayann Heman  . Total knee arthroplasty Left 07/19/2015    Procedure: LEFT TOTAL KNEE ARTHROPLASTY;  Surgeon: Latanya Maudlin, MD;  Location: [facility];  Service: Orthopedics;  Laterality: Left;    There were no vitals filed for this visit.  Visit Diagnosis:  Knee stiffness, left  Abnormality of gait  Right low back pain, with sciatica presence unspecified  Weakness of both legs  Gait instability      Subjective Assessment - 12/09/15 1630    Subjective Patient reports getting in and out of the car is getting  better due to improved strength in left LE. Patient reports continues to expierience back pain   Patient is accompained by: Family member   Pertinent History Rt TKA, CVA   Limitations Standing   How long can you stand comfortably? difficult   How long can you walk comfortably? needs a rolling walker   Patient Stated Goals increase left knee flexion; reduce pain   Currently in Pain? No/denies   Multiple Pain Sites Yes   Pain Score 2   Pain Location Back   Pain Orientation Mid   Pain Descriptors / Indicators Discomfort  Pain Type Chronic pain   Pain Onset More than a month ago   Aggravating Factors  transfers into and out of the car, pikcing up things   Pain Relieving Factors rest                         [facility] Adult PT Treatment/Exercise - 12/09/15 0001    Ambulation/Gait   Ambulation/Gait Yes   Ambulation/Gait Assistance 6: Modified independent (Device/Increase time)   Ambulation Distance (Feet) 100 Feet   Assistive device Rolling walker   Gait Pattern Decreased step length - right;Decreased step length - left;Decreased stride length;Decreased hip/knee flexion - left;Decreased dorsiflexion - left   Gait Comments cues to incr step length on right LE, and for staying inside walker   Posture/Postural Control   Posture/Postural Control Postural limitations   Postural Limitations  Rounded Shoulders;Forward head;Flexed trunk   Exercises   Exercises Knee/Hip   Knee/Hip Exercises: Aerobic   Nustep Level 2 x 10 minutes  seat#8, arms # 10   Knee/Hip Exercises: Standing   Hip Abduction Stengthening;2 sets;10 reps  43 each leg   Other Standing Knee Exercises Hamstring curls 2# Lt LE & 4# Rt LE   Knee/Hip Exercises: Seated   Long Arc Quad Strengthening;Left;3 sets;10 reps   Long Arc Quad Weight 4 lbs.   Ball Squeeze 2x10   Marching Strengthening;Both;2 sets;10 reps  4# added   Shoulder Exercises: Seated   Other Seated Exercises shoulder 3 way raises 1# 2x10                  PT Short Term Goals - 12/09/15 1642    PT SHORT TERM GOAL #1   Title be independent in initial HEP   Time 4   Period Weeks   Status Achieved   PT SHORT TERM GOAL #2   Title left knee PROM >/= 100 degrees   Time 4   Period Weeks   Status On-going   PT SHORT TERM GOAL #3   Title left knee strength 4-/5 so she can walk safely with walker   Time 4   Period Weeks   Status On-going           PT Long Term Goals - 12/09/15 1643    PT LONG TERM GOAL #1   Title be independent in advanced HEP   Time 8   Period Weeks   Status On-going   PT LONG TERM GOAL #2   Title FOTO score is </= 61% limitation for knee   Time 8   Period Weeks   Status On-going   PT LONG TERM GOAL #3   Title Left knee flexion AROM >/= 105 degree to make it easier to walk   Time 8   Period Weeks   Status On-going   PT LONG TERM GOAL #4   Title left knee strength 4+/5 so patient is able to return home to live   Time 8   Period Weeks   Status On-going   PT LONG TERM GOAL #5   Title pain decreased >/= 50%    Time 8   Period Weeks   Status Achieved   PT LONG TERM GOAL #6   Title back pain with daily activities decresae >/= 50%    Time 8   Period Weeks   Status On-going   PT LONG TERM GOAL #7   Title TUG </= 50 seconds due to increased strength and endurance with rolling walker  Time 8    Period Weeks   Status On-going   PT LONG TERM GOAL #8   Title getting up and down from chair with 1 try due to increased strength and decreased pain with using her hands.    Time 8   Period Weeks   Status On-going               Plan - 12/09/15 1635    Clinical Impression Statement Patient is fa 82 y.o. female with diagnosis of left TKA and lumbar stenosis. Pt with weakness in bil LE left > than Rt. Pt with history of falls. Patient will continue to benefit from skilled Pt to improve LE strength, gait and back pain and blance.    Pt will benefit from skilled therapeutic intervention in order to improve on the following deficits Pain;Decreased strength;Decreased mobility;Decreased balance;Decreased activity tolerance;Decreased endurance;Difficulty walking;Decreased range of motion;Abnormal gait   Rehab Potential Good   Clinical Impairments Affecting Rehab Potential None   PT Frequency 3x / week   PT Duration 8 weeks   PT Treatment/Interventions Moist Heat;Therapeutic activities;Patient/family education;Therapeutic exercise;Manual techniques;Balance training;Functional mobility training;Cryotherapy;Neuromuscular re-education;Gait training;Stair training;Passive range of motion   PT Next Visit Plan work on back pain, upright posture, left knee ROM and strength, balance   PT Home Exercise Plan add gentle back strengthening   Recommended Other Services none   Consulted and Agree with Plan of Care Patient        Problem List Patient Active Problem List   Diagnosis Date Noted  . History of total knee arthroplasty 07/19/2015  . At risk for sleep apnea 07/17/2015  . Memory loss 04/05/2014  . Diabetes mellitus type 2 with complications, uncontrolled ([place]) 01/24/2014  . Chronic renal insufficiency, stage II (mild) 03/21/2013  . Encounter for long-term (current) use of other medications 07/29/2012  . Diabetes ([place]) 04/28/2012  . CVA (cerebral vascular accident) ([place])   . EDEMA-  LOCALIZED 01/06/2011  . Chronic systolic heart failure ([place]) 12/26/2010  . GERD 04/30/2010  . TRANSIENT ISCHEMIC ATTACKS, HX OF 04/30/2010  . DYSPEPSIA 04/14/2010  . WEAKNESS 03/19/2010  . CONSTIPATION 02/13/2010  . ANEMIA, MILD 01/14/2010  . Hyperlipidemia 09/16/2008  . HYPERTENSION, BENIGN 09/16/2008  . CAD, AUTOLOGOUS BYPASS GRAFT 09/16/2008  . SICK SINUS/ TACHY-BRADY SYNDROME 09/16/2008  . PACEMAKER, PERMANENT 09/16/2008  . Coronary atherosclerosis 10/25/2007  . Hypothyroidism 06/14/2007  . COLONIC POLYPS, HX OF 03/23/2007    NAUMANN-HOUEGNIFIO,Ziare Orrick PTA 12/09/2015, 4:58 PM  [facility] [address], [place], [place], [address] Phone: [phone]   Fax:  [phone]  Name: [name] MRN: [id number] Date of Birth: 08/20/1925

## 2015-12-11 ENCOUNTER — Encounter: Payer: Medicare Other | Admitting: Physical Therapy

## 2015-12-15 LAB — CUP PACEART REMOTE DEVICE CHECK
Battery Impedance: 231 Ohm
Battery Remaining Longevity: 118 mo
Battery Voltage: 2.78 V
Brady Statistic AP VP Percent: 2 %
Date Time Interrogation Session: 20170110152838
Implantable Lead Implant Date: 20010503
Implantable Lead Model: 5076
Lead Channel Pacing Threshold Amplitude: 1.375 V
Lead Channel Setting Pacing Amplitude: 2 V
Lead Channel Setting Pacing Amplitude: 2.75 V
Lead Channel Setting Pacing Pulse Width: 0.4 ms
Lead Channel Setting Sensing Sensitivity: 5.6 mV
MDC IDC LEAD IMPLANT DT: 20010503
MDC IDC LEAD LOCATION: 753859
MDC IDC LEAD LOCATION: 753860
MDC IDC MSMT LEADCHNL RA IMPEDANCE VALUE: 393 Ohm
MDC IDC MSMT LEADCHNL RA PACING THRESHOLD AMPLITUDE: 0.625 V
MDC IDC MSMT LEADCHNL RA PACING THRESHOLD PULSEWIDTH: 0.4 ms
MDC IDC MSMT LEADCHNL RV IMPEDANCE VALUE: 706 Ohm
MDC IDC MSMT LEADCHNL RV PACING THRESHOLD PULSEWIDTH: 0.4 ms
MDC IDC MSMT LEADCHNL RV SENSING INTR AMPL: 11.2 mV
MDC IDC STAT BRADY AP VS PERCENT: 96 %
MDC IDC STAT BRADY AS VP PERCENT: 0 %
MDC IDC STAT BRADY AS VS PERCENT: 1 %

## 2015-12-16 ENCOUNTER — Encounter: Payer: Medicare Other | Admitting: Physical Therapy

## 2015-12-18 ENCOUNTER — Encounter: Payer: Self-pay | Admitting: Physical Therapy

## 2015-12-18 ENCOUNTER — Ambulatory Visit: Payer: Medicare Other | Admitting: Physical Therapy

## 2015-12-18 DIAGNOSIS — M25662 Stiffness of left knee, not elsewhere classified: Secondary | ICD-10-CM

## 2015-12-18 DIAGNOSIS — R2681 Unsteadiness on feet: Secondary | ICD-10-CM

## 2015-12-18 DIAGNOSIS — R269 Unspecified abnormalities of gait and mobility: Secondary | ICD-10-CM

## 2015-12-18 DIAGNOSIS — M545 Low back pain: Secondary | ICD-10-CM

## 2015-12-18 DIAGNOSIS — R29898 Other symptoms and signs involving the musculoskeletal system: Secondary | ICD-10-CM

## 2015-12-18 NOTE — Therapy (Signed)
[facility] [address] [place], [place], [address] Phone: [phone]   Fax:  [phone]  Physical Therapy Treatment  Patient Details  Name: [name] MRN: [id number] Date of Birth: 03-11-1925 Referring Provider: Dr. Latanya Maudlin  Encounter Date: 12/18/2015      PT End of Session - 12/18/15 1617    Visit Number 15   Number of Visits 20   Date for PT Re-Evaluation 01/29/16   PT Start Time 1611   PT Stop Time 1655   PT Time Calculation (min) 44 min   Activity Tolerance Patient tolerated treatment well   Behavior During Therapy [facility] for tasks assessed/performed      Past Medical History  Diagnosis Date  . Sliding hiatal hernia   . Erosive esophagitis   . Peptic stricture of esophagus   . Diverticulosis   . Arthritis   . Hypothyroidism   . Diabetes mellitus     Type I  . Sick sinus syndrome [facility])     s/p Medtronic DDD pacemaker implanted; generator change 02/2012  . Hypertension   . Hyperlipidemia   . GERD (gastroesophageal reflux disease)   . Adenomatous colon polyp 07/2002  . Obesity   . Paroxysmal atrial fibrillation ([place]) 02/04/15    discovered on PPM remote interrogation 6/16  . Coronary artery disease     s/p CABG 2001 x 5  . CVA (cerebral vascular accident) [facility]) 2013 or 2014    Dr Evelena Leyden, Neurology  . Dementia   . History of swelling of feet     props up at night, circulations has been checked inlegs in past and was told it was ok    Past Surgical History  Procedure Laterality Date  . Pacemaker placement  03/25/2000    by Dr Olevia Perches  . Tonsillectomy and adenoidectomy    . Appendectomy    . Cholecystectomy    . Total abdominal hysterectomy w/ bilateral salpingoophorectomy      Endometiosis  . Replacement total knee  2004    Right  . Colonoscopy w/ polypectomy  2003  . Cataract extraction  05/2005  . Coronary artery bypass graft  02/2000    5 vessel  . Rotator cuff repair Right 2009   . Permanent pacemaker generator change  03/18/2012    PPM gen change by Dr Rayann Heman  . Total knee arthroplasty Left 07/19/2015    Procedure: LEFT TOTAL KNEE ARTHROPLASTY;  Surgeon: Latanya Maudlin, MD;  Location: [facility];  Service: Orthopedics;  Laterality: Left;    There were no vitals filed for this visit.  Visit Diagnosis:  Knee stiffness, left  Abnormality of gait  Right low back pain, with sciatica presence unspecified  Weakness of both legs  Gait instability  Weakness of left lower extremity      Subjective Assessment - 12/18/15 1619    Subjective I did my leg exercises this morning.   Currently in Pain? No/denies   Multiple Pain Sites No                         [facility] Adult PT Treatment/Exercise - 12/18/15 0001    Lumbar Exercises: Standing   Other Standing Lumbar Exercises Standing upright at walker 10x    Knee/Hip Exercises: Aerobic   Nustep L3 x10 min   Knee/Hip Exercises: Standing   Hip Abduction Stengthening;Both;2 sets;10 reps;Knee straight   Other Standing Knee Exercises Hamstring curls 2# Lt LE & 4# Rt LE  VC on posture   Knee/Hip Exercises: Seated   Long Arc Quad Strengthening;Left;3 sets;10 reps   Long Arc Quad Weight 4 lbs.   Ball Squeeze 2x10   Marching Strengthening;Both;2 sets;10 reps  4# added   Marching Limitations reduced to 2# on LT for second set    Shoulder Exercises: Seated   Other Seated Exercises shoulder 3 way raises 1# 2x10   Moist Heat Therapy   Moist Heat Location Lumbar Spine  During her seated exercises                  PT Short Term Goals - 12/09/15 1642    PT SHORT TERM GOAL #1   Title be independent in initial HEP   Time 4   Period Weeks   Status Achieved   PT SHORT TERM GOAL #2   Title left knee PROM >/= 100 degrees   Time 4   Period Weeks   Status On-going   PT SHORT TERM GOAL #3   Title left knee strength 4-/5 so she can walk safely with walker   Time 4   Period Weeks   Status On-going            PT Long Term Goals - 12/09/15 1643    PT LONG TERM GOAL #1   Title be independent in advanced HEP   Time 8   Period Weeks   Status On-going   PT LONG TERM GOAL #2   Title FOTO score is </= 61% limitation for knee   Time 8   Period Weeks   Status On-going   PT LONG TERM GOAL #3   Title Left knee flexion AROM >/= 105 degree to make it easier to walk   Time 8   Period Weeks   Status On-going   PT LONG TERM GOAL #4   Title left knee strength 4+/5 so patient is able to return home to live   Time 8   Period Weeks   Status On-going   PT LONG TERM GOAL #5   Title pain decreased >/= 50%    Time 8   Period Weeks   Status Achieved   PT LONG TERM GOAL #6   Title back pain with daily activities decresae >/= 50%    Time 8   Period Weeks   Status On-going   PT LONG TERM GOAL #7   Title TUG </= 50 seconds due to increased strength and endurance with rolling walker    Time 8   Period Weeks   Status On-going   PT LONG TERM GOAL #8   Title getting up and down from chair with 1 try due to increased strength and decreased pain with using her hands.    Time 8   Period Weeks   Status On-going               Plan - 12/18/15 1633    Clinical Impression Statement Used some moist heat to her back during seated exercises today which helped to keep her back muscles from tightening. Increased Nustep to level 3 which she tolerated without pain/negative outcome. ZRemains with short steps  when she walks with her walker.    Pt will benefit from skilled therapeutic intervention in order to improve on the following deficits Pain;Decreased strength;Decreased mobility;Decreased balance;Decreased activity tolerance;Decreased endurance;Difficulty walking;Decreased range of motion;Abnormal gait   Rehab Potential Good   Clinical Impairments Affecting Rehab Potential None   PT Frequency 3x / week   PT Duration  8 weeks   PT Treatment/Interventions Moist Heat;Therapeutic  activities;Patient/family education;Therapeutic exercise;Manual techniques;Balance training;Functional mobility training;Cryotherapy;Neuromuscular re-education;Gait training;Stair training;Passive range of motion   PT Next Visit Plan continue with moist heat to her back when performing seated exercises. Continue with leg/knee ROM strength. Measure knee flexion in next 1-2 visits.    Consulted and Agree with Plan of Care Patient        Problem List Patient Active Problem List   Diagnosis Date Noted  . History of total knee arthroplasty 07/19/2015  . At risk for sleep apnea 07/17/2015  . Memory loss 04/05/2014  . Diabetes mellitus type 2 with complications, uncontrolled ([place]) 01/24/2014  . Chronic renal insufficiency, stage II (mild) 03/21/2013  . Encounter for long-term (current) use of other medications 07/29/2012  . Diabetes ([place]) 04/28/2012  . CVA (cerebral vascular accident) ([place])   . EDEMA- LOCALIZED 01/06/2011  . Chronic systolic heart failure ([place]) 12/26/2010  . GERD 04/30/2010  . TRANSIENT ISCHEMIC ATTACKS, HX OF 04/30/2010  . DYSPEPSIA 04/14/2010  . WEAKNESS 03/19/2010  . CONSTIPATION 02/13/2010  . ANEMIA, MILD 01/14/2010  . Hyperlipidemia 09/16/2008  . HYPERTENSION, BENIGN 09/16/2008  . CAD, AUTOLOGOUS BYPASS GRAFT 09/16/2008  . SICK SINUS/ TACHY-BRADY SYNDROME 09/16/2008  . PACEMAKER, PERMANENT 09/16/2008  . Coronary atherosclerosis 10/25/2007  . Hypothyroidism 06/14/2007  . COLONIC POLYPS, HX OF 03/23/2007    COCHRAN,JENNIFER, PTA 12/18/2015, 4:55 PM  [facility] [address] [place], [place], [address] Phone: [phone]   Fax:  [phone]  Name: [name] MRN: [id number] Date of Birth: [birth year]

## 2015-12-20 ENCOUNTER — Encounter: Payer: Self-pay | Admitting: Cardiology

## 2015-12-23 ENCOUNTER — Ambulatory Visit: Payer: Medicare Other | Admitting: Physical Therapy

## 2015-12-23 ENCOUNTER — Encounter: Payer: Self-pay | Admitting: Physical Therapy

## 2015-12-23 DIAGNOSIS — M25662 Stiffness of left knee, not elsewhere classified: Secondary | ICD-10-CM | POA: Diagnosis not present

## 2015-12-23 DIAGNOSIS — R29898 Other symptoms and signs involving the musculoskeletal system: Secondary | ICD-10-CM

## 2015-12-23 DIAGNOSIS — M545 Low back pain: Secondary | ICD-10-CM

## 2015-12-23 DIAGNOSIS — M7989 Other specified soft tissue disorders: Secondary | ICD-10-CM

## 2015-12-23 DIAGNOSIS — R2681 Unsteadiness on feet: Secondary | ICD-10-CM

## 2015-12-23 DIAGNOSIS — R269 Unspecified abnormalities of gait and mobility: Secondary | ICD-10-CM

## 2015-12-23 NOTE — Therapy (Signed)
[facility] [address] [place], [place], [address] Phone: [phone]   Fax:  [phone]  Physical Therapy Treatment  Patient Details  Name: [name] MRN: [id number] Date of Birth: 05/27/1925 Referring Provider: Dr. Latanya Maudlin  Encounter Date: 12/23/2015      PT End of Session - 12/23/15 1622    Visit Number 16   Number of Visits 20   Date for PT Re-Evaluation 01/29/16   PT Start Time 1612   PT Stop Time 1657   PT Time Calculation (min) 45 min   Activity Tolerance Patient tolerated treatment well   Behavior During Therapy [facility] for tasks assessed/performed      Past Medical History  Diagnosis Date  . Sliding hiatal hernia   . Erosive esophagitis   . Peptic stricture of esophagus   . Diverticulosis   . Arthritis   . Hypothyroidism   . Diabetes mellitus     Type I  . Sick sinus syndrome [facility])     s/p Medtronic DDD pacemaker implanted; generator change 02/2012  . Hypertension   . Hyperlipidemia   . GERD (gastroesophageal reflux disease)   . Adenomatous colon polyp 07/2002  . Obesity   . Paroxysmal atrial fibrillation ([place]) 02/04/15    discovered on PPM remote interrogation 6/16  . Coronary artery disease     s/p CABG 2001 x 5  . CVA (cerebral vascular accident) [facility]) 2013 or 2014    Dr Evelena Leyden, Neurology  . Dementia   . History of swelling of feet     props up at night, circulations has been checked inlegs in past and was told it was ok    Past Surgical History  Procedure Laterality Date  . Pacemaker placement  03/25/2000    by Dr Olevia Perches  . Tonsillectomy and adenoidectomy    . Appendectomy    . Cholecystectomy    . Total abdominal hysterectomy w/ bilateral salpingoophorectomy      Endometiosis  . Replacement total knee  2004    Right  . Colonoscopy w/ polypectomy  2003  . Cataract extraction  05/2005  . Coronary artery bypass graft  02/2000    5 vessel  . Rotator cuff repair Right 2009   . Permanent pacemaker generator change  03/18/2012    PPM gen change by Dr Rayann Heman  . Total knee arthroplasty Left 07/19/2015    Procedure: LEFT TOTAL KNEE ARTHROPLASTY;  Surgeon: Latanya Maudlin, MD;  Location: [facility];  Service: Orthopedics;  Laterality: Left;    There were no vitals filed for this visit.  Visit Diagnosis:  Knee stiffness, left  Abnormality of gait  Right low back pain, with sciatica presence unspecified  Weakness of both legs  Gait instability  Weakness of left lower extremity  Swelling of limb      Subjective Assessment - 12/23/15 1622    Subjective Pt arrived with her rolling walker, reports she is feeling good.    Patient is accompained by: Family member   Pertinent History Rt TKA, CVA   Limitations Standing   How long can you stand comfortably? difficult   How long can you walk comfortably? needs a rolling walker   Patient Stated Goals increase left knee flexion; reduce pain   Currently in Pain? No/denies                         [facility] Adult PT Treatment/Exercise - 12/23/15 0001  Ambulation/Gait   Ambulation/Gait Yes   Ambulation/Gait Assistance 6: Modified independent (Device/Increase time)   Ambulation Distance (Feet) 100 Feet   Assistive device Rolling walker   Gait Pattern Decreased step length - right;Decreased step length - left;Decreased stride length;Decreased hip/knee flexion - left;Decreased dorsiflexion - left   Gait Comments cues to incr step length on right LE, and for staying inside walker   Posture/Postural Control   Posture/Postural Control Postural limitations   Postural Limitations Rounded Shoulders;Forward head;Flexed trunk   Exercises   Exercises Knee/Hip   Lumbar Exercises: Standing   Other Standing Lumbar Exercises Standing upright at walker 10x    Other Standing Lumbar Exercises standing rolling red ball up the wall 2 x 5   Knee/Hip Exercises: Aerobic   Nustep L3 x10 min  seat #8, arms #10   Knee/Hip  Exercises: Standing   Hip Abduction Stengthening;Both;2 sets;10 reps;Knee straight  4# each leg   Other Standing Knee Exercises Hamstring curls 2# Lt LE & 4# Rt LE   Knee/Hip Exercises: Seated   Long Probation officer   Long Arc Quad Weight 4 lbs.   Ball Squeeze 2x10   Marching Strengthening;Both;2 sets;10 reps   Marching Limitations reduced to 2# on LT for second set    Shoulder Exercises: Seated   Other Seated Exercises shoulder 3 way raises 1# 2x10   Moist Heat Therapy   Moist Heat Location Lumbar Spine  during sitting exercises on back                  PT Short Term Goals - 12/23/15 1626    PT SHORT TERM GOAL #1   Title be independent in initial HEP   Time 4   Period Weeks   Status Achieved   PT SHORT TERM GOAL #2   Title left knee PROM >/= 100 degrees   Time 4   Period Weeks   Status On-going   PT SHORT TERM GOAL #3   Title left knee strength 4-/5 so she can walk safely with walker   Time 4   Period Weeks   Status On-going           PT Long Term Goals - 12/23/15 1626    PT LONG TERM GOAL #1   Title be independent in advanced HEP   Time 8   Period Weeks   Status On-going   PT LONG TERM GOAL #2   Title FOTO score is </= 61% limitation for knee   Time 8   Period Weeks   Status On-going   PT LONG TERM GOAL #3   Title Left knee flexion AROM >/= 105 degree to make it easier to walk   Time 8   Period Weeks   Status On-going   PT LONG TERM GOAL #4   Title left knee strength 4+/5 so patient is able to return home to live   Time 8   Period Weeks   Status On-going   PT LONG TERM GOAL #5   Title pain decreased >/= 50%    Time 8   Period Weeks   Status Achieved   PT LONG TERM GOAL #6   Title back pain with daily activities decresae >/= 50%    Time 8   Period Weeks   Status On-going   PT LONG TERM GOAL #7   Title TUG </= 50 seconds due to increased strength and endurance with rolling walker    Time 8   Period Weeks  Status On-going    PT LONG TERM GOAL #8   Title getting up and down from chair with 1 try due to increased strength and decreased pain with using her hands.    Time 8   Period Weeks   Status On-going               Plan - 12/23/15 1623    Clinical Impression Statement Used moist heat during sitting exercises to prevent tightening of back muscles. Pt continues to tolerate level 3 on Nu-Step well. Pt pushes walker to far out and needs constant reminders to increase step length   Pt will benefit from skilled therapeutic intervention in order to improve on the following deficits Pain;Decreased strength;Decreased mobility;Decreased balance;Decreased activity tolerance;Decreased endurance;Difficulty walking;Decreased range of motion;Abnormal gait   Rehab Potential Good   Clinical Impairments Affecting Rehab Potential None   PT Frequency 3x / week   PT Duration 8 weeks   PT Treatment/Interventions Moist Heat;Therapeutic activities;Patient/family education;Therapeutic exercise;Manual techniques;Balance training;Functional mobility training;Cryotherapy;Neuromuscular re-education;Gait training;Stair training;Passive range of motion   PT Next Visit Plan Perfrom TUG. continue with moist heat to her back when performing seated exercises. Continue with leg/knee ROM strength. Measure knee flexion in next 1-2 visits.    PT Home Exercise Plan add gentle back strengthening   Consulted and Agree with Plan of Care Patient        Problem List Patient Active Problem List   Diagnosis Date Noted  . History of total knee arthroplasty 07/19/2015  . At risk for sleep apnea 07/17/2015  . Memory loss 04/05/2014  . Diabetes mellitus type 2 with complications, uncontrolled ([place]) 01/24/2014  . Chronic renal insufficiency, stage II (mild) 03/21/2013  . Encounter for long-term (current) use of other medications 07/29/2012  . Diabetes ([place]) 04/28/2012  . CVA (cerebral vascular accident) ([place])   . EDEMA- LOCALIZED 01/06/2011  .  Chronic systolic heart failure ([place]) 12/26/2010  . GERD 04/30/2010  . TRANSIENT ISCHEMIC ATTACKS, HX OF 04/30/2010  . DYSPEPSIA 04/14/2010  . WEAKNESS 03/19/2010  . CONSTIPATION 02/13/2010  . ANEMIA, MILD 01/14/2010  . Hyperlipidemia 09/16/2008  . HYPERTENSION, BENIGN 09/16/2008  . CAD, AUTOLOGOUS BYPASS GRAFT 09/16/2008  . SICK SINUS/ TACHY-BRADY SYNDROME 09/16/2008  . PACEMAKER, PERMANENT 09/16/2008  . Coronary atherosclerosis 10/25/2007  . Hypothyroidism 06/14/2007  . COLONIC POLYPS, HX OF 03/23/2007    NAUMANN-HOUEGNIFIO,[name] PTA 12/23/2015, 4:59 PM  [facility] [address] [place], [place], [address] Phone: [phone]   Fax:  [phone]  Name: GARIE BARLOW MRN: [id number] Date of Birth: [birth year]

## 2015-12-25 ENCOUNTER — Ambulatory Visit: Payer: Medicare Other | Attending: Orthopedic Surgery | Admitting: Physical Therapy

## 2015-12-25 ENCOUNTER — Encounter: Payer: Self-pay | Admitting: Physical Therapy

## 2015-12-25 DIAGNOSIS — R29898 Other symptoms and signs involving the musculoskeletal system: Secondary | ICD-10-CM | POA: Diagnosis present

## 2015-12-25 DIAGNOSIS — M7989 Other specified soft tissue disorders: Secondary | ICD-10-CM | POA: Insufficient documentation

## 2015-12-25 DIAGNOSIS — M545 Low back pain: Secondary | ICD-10-CM | POA: Insufficient documentation

## 2015-12-25 DIAGNOSIS — M25662 Stiffness of left knee, not elsewhere classified: Secondary | ICD-10-CM | POA: Insufficient documentation

## 2015-12-25 DIAGNOSIS — R269 Unspecified abnormalities of gait and mobility: Secondary | ICD-10-CM | POA: Diagnosis present

## 2015-12-25 DIAGNOSIS — R2681 Unsteadiness on feet: Secondary | ICD-10-CM | POA: Diagnosis present

## 2015-12-25 NOTE — Therapy (Signed)
[facility] [address] [place], [place], [address] Phone: [phone]   Fax:  [phone]  Physical Therapy Treatment  Patient Details  Name: [name] MRN: [id number] Date of Birth: 09/28/1925 Referring Provider: Dr. Latanya Maudlin  Encounter Date: 12/25/2015      PT End of Session - 12/25/15 1622    Visit Number 17   Number of Visits 20   Date for PT Re-Evaluation 01/29/16   PT Start Time [id number]   PT Stop Time 1700   PT Time Calculation (min) 45 min   Activity Tolerance Patient tolerated treatment well   Behavior During Therapy [facility] for tasks assessed/performed      Past Medical History  Diagnosis Date  . Sliding hiatal hernia   . Erosive esophagitis   . Peptic stricture of esophagus   . Diverticulosis   . Arthritis   . Hypothyroidism   . Diabetes mellitus     Type I  . Sick sinus syndrome [facility])     s/p Medtronic DDD pacemaker implanted; generator change 02/2012  . Hypertension   . Hyperlipidemia   . GERD (gastroesophageal reflux disease)   . Adenomatous colon polyp 07/2002  . Obesity   . Paroxysmal atrial fibrillation ([place]) 02/04/15    discovered on PPM remote interrogation 6/16  . Coronary artery disease     s/p CABG 2001 x 5  . CVA (cerebral vascular accident) [facility]) 2013 or 2014    Dr Evelena Leyden, Neurology  . Dementia   . History of swelling of feet     props up at night, circulations has been checked inlegs in past and was told it was ok    Past Surgical History  Procedure Laterality Date  . Pacemaker placement  03/25/2000    by Dr Olevia Perches  . Tonsillectomy and adenoidectomy    . Appendectomy    . Cholecystectomy    . Total abdominal hysterectomy w/ bilateral salpingoophorectomy      Endometiosis  . Replacement total knee  2004    Right  . Colonoscopy w/ polypectomy  2003  . Cataract extraction  05/2005  . Coronary artery bypass graft  02/2000    5 vessel  . Rotator cuff repair Right 2009   . Permanent pacemaker generator change  03/18/2012    PPM gen change by Dr Rayann Heman  . Total knee arthroplasty Left 07/19/2015    Procedure: LEFT TOTAL KNEE ARTHROPLASTY;  Surgeon: Latanya Maudlin, MD;  [facility]: [facility];  Service: Orthopedics;  Laterality: Left;    There were no vitals filed for this visit.  Visit Diagnosis:  Knee stiffness, left  Abnormality of gait  Right low back pain, with sciatica presence unspecified  Weakness of both legs  Gait instability  Weakness of left lower extremity      Subjective Assessment - 12/25/15 1621    Subjective Reports the heating pad makes the back pain go away.    Currently in Pain? No/denies   Multiple Pain Sites No            [facility] PT Assessment - 12/25/15 0001    AROM   Left Knee Flexion 85   PROM   Left Knee Flexion 93                     [facility] Adult PT Treatment/Exercise - 12/25/15 0001    Lumbar Exercises: Standing   Other Standing Lumbar Exercises Standing upright at walker 10x    Knee/Hip  Exercises: Aerobic   Nustep L3 x 10 min   Knee/Hip Exercises: Standing   Hip Abduction AROM;Stengthening;Both;3 sets;10 reps;Knee straight  No weights today. Focused on more motion which she did 0#.   Knee/Hip Exercises: Seated   Long Arc Quad Strengthening;Both;3 sets;10 reps   Long Arc Quad Weight 0 lbs.   Ball Squeeze 3x10    Marching AROM;Strengthening;Both;3 sets;10 reps   Shoulder Exercises: Seated   Other Seated Exercises shoulder 3 way raises 1# 2x10   Moist Heat Therapy   Moist Heat Location Lumbar Spine  during sitting exercises on back                  PT Short Term Goals - 12/25/15 1645    PT SHORT TERM GOAL #2   Title left knee PROM >/= 100 degrees   Time 4   Period Weeks   Status On-going  93 degrees           PT Long Term Goals - 12/23/15 1626    PT LONG TERM GOAL #1   Title be independent in advanced HEP   Time 8   Period Weeks   Status On-going   PT LONG TERM GOAL #2    Title FOTO score is </= 61% limitation for knee   Time 8   Period Weeks   Status On-going   PT LONG TERM GOAL #3   Title Left knee flexion AROM >/= 105 degree to make it easier to walk   Time 8   Period Weeks   Status On-going   PT LONG TERM GOAL #4   Title left knee strength 4+/5 so patient is able to return home to live   Time 8   Period Weeks   Status On-going   PT LONG TERM GOAL #5   Title pain decreased >/= 50%    Time 8   Period Weeks   Status Achieved   PT LONG TERM GOAL #6   Title back pain with daily activities decresae >/= 50%    Time 8   Period Weeks   Status On-going   PT LONG TERM GOAL #7   Title TUG </= 50 seconds due to increased strength and endurance with rolling walker    Time 8   Period Weeks   Status On-going   PT LONG TERM GOAL #8   Title getting up and down from chair with 1 try due to increased strength and decreased pain with using her hands.    Time 8   Period Weeks   Status On-going               Plan - 12/25/15 1635    Clinical Impression Statement Today we focused on improving her LE  movement vs using wts for strength. She had more hip motion with both standing and sitting exercises. Pt reports today the heat at her back takes the pain away.  See knee ROM in chart.    Pt will benefit from skilled therapeutic intervention in order to improve on the following deficits Pain;Decreased strength;Decreased mobility;Decreased balance;Decreased activity tolerance;Decreased endurance;Difficulty walking;Decreased range of motion;Abnormal gait   Rehab Potential Good   Clinical Impairments Affecting Rehab Potential None   PT Frequency 3x / week   PT Duration 8 weeks   PT Treatment/Interventions Moist Heat;Therapeutic activities;Patient/family education;Therapeutic exercise;Manual techniques;Balance training;Functional mobility training;Cryotherapy;Neuromuscular re-education;Gait training;Stair training;Passive range of motion   PT Next Visit Plan  TUG   Consulted and Agree with Plan of Care Patient  Problem List Patient Active Problem List   Diagnosis Date Noted  . History of total knee arthroplasty 07/19/2015  . At risk for sleep apnea 07/17/2015  . Memory loss 04/05/2014  . Diabetes mellitus type 2 with complications, uncontrolled ([place]) 01/24/2014  . Chronic renal insufficiency, stage II (mild) 03/21/2013  . Encounter for long-term (current) use of other medications 07/29/2012  . Diabetes ([place]) 04/28/2012  . CVA (cerebral vascular accident) ([place])   . EDEMA- LOCALIZED 01/06/2011  . Chronic systolic heart failure ([place]) 12/26/2010  . GERD 04/30/2010  . TRANSIENT ISCHEMIC ATTACKS, HX OF 04/30/2010  . DYSPEPSIA 04/14/2010  . WEAKNESS 03/19/2010  . CONSTIPATION 02/13/2010  . ANEMIA, MILD 01/14/2010  . Hyperlipidemia 09/16/2008  . HYPERTENSION, BENIGN 09/16/2008  . CAD, AUTOLOGOUS BYPASS GRAFT 09/16/2008  . SICK SINUS/ TACHY-BRADY SYNDROME 09/16/2008  . PACEMAKER, PERMANENT 09/16/2008  . Coronary atherosclerosis 10/25/2007  . Hypothyroidism 06/14/2007  . COLONIC POLYPS, HX OF 03/23/2007    Uchechukwu Dhawan, PTA 12/25/2015, 4:59 PM  [facility] [address] [place], [place], [address] Phone: [phone]   Fax:  [phone]  Name: ARIELE TOMITA MRN: [id number] Date of Birth: 12-25-1924

## 2015-12-30 ENCOUNTER — Encounter: Payer: Self-pay | Admitting: Physical Therapy

## 2015-12-30 ENCOUNTER — Telehealth: Payer: Self-pay | Admitting: Internal Medicine

## 2015-12-30 ENCOUNTER — Ambulatory Visit: Payer: Medicare Other | Admitting: Physical Therapy

## 2015-12-30 DIAGNOSIS — R269 Unspecified abnormalities of gait and mobility: Secondary | ICD-10-CM

## 2015-12-30 DIAGNOSIS — M25662 Stiffness of left knee, not elsewhere classified: Secondary | ICD-10-CM

## 2015-12-30 DIAGNOSIS — R29898 Other symptoms and signs involving the musculoskeletal system: Secondary | ICD-10-CM

## 2015-12-30 DIAGNOSIS — R2681 Unsteadiness on feet: Secondary | ICD-10-CM

## 2015-12-30 DIAGNOSIS — M545 Low back pain: Secondary | ICD-10-CM

## 2015-12-30 NOTE — Telephone Encounter (Signed)
Pts daughter will call back.

## 2015-12-30 NOTE — Therapy (Signed)
[facility] [address] [place], [place], [address] Phone: [phone]   Fax:  [phone]  Physical Therapy Treatment  Patient Details  Name: [name] MRN: [id number] Date of Birth: May 29, 1925 Referring Provider: Dr. Latanya Maudlin  Encounter Date: 12/30/2015      PT End of Session - 12/30/15 1547    Visit Number 18   Number of Visits 20   Date for PT Re-Evaluation 01/29/16   PT Start Time 1535   PT Stop Time 1617   PT Time Calculation (min) 42 min   Activity Tolerance Patient tolerated treatment well   Behavior During Therapy [facility] for tasks assessed/performed      Past Medical History  Diagnosis Date  . Sliding hiatal hernia   . Erosive esophagitis   . Peptic stricture of esophagus   . Diverticulosis   . Arthritis   . Hypothyroidism   . Diabetes mellitus     Type I  . Sick sinus syndrome [facility])     s/p Medtronic DDD pacemaker implanted; generator change 02/2012  . Hypertension   . Hyperlipidemia   . GERD (gastroesophageal reflux disease)   . Adenomatous colon polyp 07/2002  . Obesity   . Paroxysmal atrial fibrillation ([place]) 02/04/15    discovered on PPM remote interrogation 6/16  . Coronary artery disease     s/p CABG 2001 x 5  . CVA (cerebral vascular accident) [facility]) 2013 or 2014    Dr Evelena Leyden, Neurology  . Dementia   . History of swelling of feet     props up at night, circulations has been checked inlegs in past and was told it was ok    Past Surgical History  Procedure Laterality Date  . Pacemaker placement  03/25/2000    by Dr Olevia Perches  . Tonsillectomy and adenoidectomy    . Appendectomy    . Cholecystectomy    . Total abdominal hysterectomy w/ bilateral salpingoophorectomy      Endometiosis  . Replacement total knee  2004    Right  . Colonoscopy w/ polypectomy  2003  . Cataract extraction  05/2005  . Coronary artery bypass graft  02/2000    5 vessel  . Rotator cuff repair Right 2009   . Permanent pacemaker generator change  03/18/2012    PPM gen change by Dr Rayann Heman  . Total knee arthroplasty Left 07/19/2015    Procedure: LEFT TOTAL KNEE ARTHROPLASTY;  Surgeon: Latanya Maudlin, MD;  Location: [facility];  Service: Orthopedics;  Laterality: Left;    There were no vitals filed for this visit.  Visit Diagnosis:  Knee stiffness, left  Abnormality of gait  Right low back pain, with sciatica presence unspecified  Weakness of both legs  Gait instability  Weakness of left lower extremity      Subjective Assessment - 12/30/15 1539    Subjective Pt reports performing her leg exercises at home   Patient is accompained by: Family member   Pertinent History Rt TKA, CVA   Limitations Standing   How long can you stand comfortably? difficult   How long can you walk comfortably? needs a rolling walker   Patient Stated Goals increase left knee flexion; reduce pain   Currently in Pain? No/denies   Multiple Pain Sites No                         [facility] Adult PT Treatment/Exercise - 12/30/15 0001    Ambulation/Gait  Ambulation/Gait Yes   Ambulation/Gait Assistance 6: Modified independent (Device/Increase time)   Ambulation Distance (Feet) 100 Feet   Assistive device Rolling walker   Gait Pattern Decreased step length - right;Decreased step length - left;Decreased stride length;Decreased hip/knee flexion - left;Decreased dorsiflexion - left   Gait Comments cues to improve trunk extension   Posture/Postural Control   Posture/Postural Control Postural limitations   Postural Limitations Rounded Shoulders;Forward head;Flexed trunk   Exercises   Exercises Knee/Hip   Lumbar Exercises: Standing   Other Standing Lumbar Exercises Standing upright at walker 10x    Other Standing Lumbar Exercises --   Knee/Hip Exercises: Aerobic   Nustep L3 x 10 min  seat #8, arms #9, seat to #7 after 4 min to incr flexion Lt    Knee/Hip Exercises: Standing   Hip Abduction  AROM;Stengthening;Both;3 sets;10 reps;Knee straight  3# added   Knee/Hip Exercises: Seated   Long Arc Quad Strengthening;Both;3 sets;10 reps  3#   Ball Squeeze 2x10    Marching AROM;Strengthening;Both;2 sets;10 reps  #3 added   Shoulder Exercises: Seated   Other Seated Exercises shoulder 3 way raises 1# 2x10   Moist Heat Therapy   Moist Heat Location Lumbar Spine  during sitting exercises on back                  PT Short Term Goals - 12/30/15 1557    PT SHORT TERM GOAL #1   Title be independent in initial HEP   Time 4   Period Weeks   Status Achieved   PT SHORT TERM GOAL #2   Title left knee PROM >/= 100 degrees   Time 4   Period Weeks   Status On-going   PT SHORT TERM GOAL #3   Title left knee strength 4-/5 so she can walk safely with walker   Time 4   Period Weeks   Status On-going           PT Long Term Goals - 12/23/15 1626    PT LONG TERM GOAL #1   Title be independent in advanced HEP   Time 8   Period Weeks   Status On-going   PT LONG TERM GOAL #2   Title FOTO score is </= 61% limitation for knee   Time 8   Period Weeks   Status On-going   PT LONG TERM GOAL #3   Title Left knee flexion AROM >/= 105 degree to make it easier to walk   Time 8   Period Weeks   Status On-going   PT LONG TERM GOAL #4   Title left knee strength 4+/5 so patient is able to return home to live   Time 8   Period Weeks   Status On-going   PT LONG TERM GOAL #5   Title pain decreased >/= 50%    Time 8   Period Weeks   Status Achieved   PT LONG TERM GOAL #6   Title back pain with daily activities decresae >/= 50%    Time 8   Period Weeks   Status On-going   PT LONG TERM GOAL #7   Title TUG </= 50 seconds due to increased strength and endurance with rolling walker    Time 8   Period Weeks   Status On-going   PT LONG TERM GOAL #8   Title getting up and down from chair with 1 try due to increased strength and decreased pain with using her hands.    Time 8  Period Weeks   Status On-going               Plan - 12/30/15 1548    Clinical Impression Statement Continue with weight on LE to improve strength her MD wishes her to improve to 5#. Observed improved trunk extension with ambulation with RW. Continue with strengthening exercises and trunk extension    Pt will benefit from skilled therapeutic intervention in order to improve on the following deficits Pain;Decreased strength;Decreased mobility;Decreased balance;Decreased activity tolerance;Decreased endurance;Difficulty walking;Decreased range of motion;Abnormal gait   Rehab Potential Good   Clinical Impairments Affecting Rehab Potential None   PT Frequency 3x / week   PT Duration 8 weeks   PT Treatment/Interventions Moist Heat;Therapeutic activities;Patient/family education;Therapeutic exercise;Manual techniques;Balance training;Functional mobility training;Cryotherapy;Neuromuscular re-education;Gait training;Stair training;Passive range of motion   PT Next Visit Plan TUG   PT Home Exercise Plan add gentle back strengthening   Consulted and Agree with Plan of Care Patient        Problem List Patient Active Problem List   Diagnosis Date Noted  . History of total knee arthroplasty 07/19/2015  . At risk for sleep apnea 07/17/2015  . Memory loss 04/05/2014  . Diabetes mellitus type 2 with complications, uncontrolled ([place]) 01/24/2014  . Chronic renal insufficiency, stage II (mild) 03/21/2013  . Encounter for long-term (current) use of other medications 07/29/2012  . Diabetes ([place]) 04/28/2012  . CVA (cerebral vascular accident) ([place])   . EDEMA- LOCALIZED 01/06/2011  . Chronic systolic heart failure ([place]) 12/26/2010  . GERD 04/30/2010  . TRANSIENT ISCHEMIC ATTACKS, HX OF 04/30/2010  . DYSPEPSIA 04/14/2010  . WEAKNESS 03/19/2010  . CONSTIPATION 02/13/2010  . ANEMIA, MILD 01/14/2010  . Hyperlipidemia 09/16/2008  . HYPERTENSION, BENIGN 09/16/2008  . CAD, AUTOLOGOUS BYPASS GRAFT  09/16/2008  . SICK SINUS/ TACHY-BRADY SYNDROME 09/16/2008  . PACEMAKER, PERMANENT 09/16/2008  . Coronary atherosclerosis 10/25/2007  . Hypothyroidism 06/14/2007  . COLONIC POLYPS, HX OF 03/23/2007    NAUMANN-HOUEGNIFIO,Kirsta Probert PTA 12/30/2015, 4:12 PM  [facility] [address], [place], [place], [address] Phone: [phone]   Fax:  [phone]  Name: [name] MRN: [id number] Date of Birth: [birth year]

## 2015-12-30 NOTE — Telephone Encounter (Signed)
Pts daughter called in wanting to prepare [place] for pt's visit. Pt recently had knee surgery and was told before the knee surgery that she needed to move in with daughter because she no longer needed to live alone. She is wanting to hear that she can move back home by herself. She is having more issues with falling and remembering things. She would also like more explanation on her spinal condition. She does not understand why she has this and what it can cause in the future.

## 2015-12-30 NOTE — Telephone Encounter (Signed)
[name], called want to speak to the assistant concern about the appt that [name] has tomorrow with Dr. Quay Burow. She want to inform Dr. Quay Burow some of [name] condition. Please give her a call back today

## 2015-12-31 ENCOUNTER — Encounter: Payer: Self-pay | Admitting: Internal Medicine

## 2015-12-31 ENCOUNTER — Ambulatory Visit (INDEPENDENT_AMBULATORY_CARE_PROVIDER_SITE_OTHER): Payer: Medicare Other | Admitting: Internal Medicine

## 2015-12-31 VITALS — BP 124/64 | HR 65 | Temp 97.6°F | Resp 16 | Wt 198.0 lb

## 2015-12-31 DIAGNOSIS — Z23 Encounter for immunization: Secondary | ICD-10-CM | POA: Diagnosis not present

## 2015-12-31 DIAGNOSIS — E1165 Type 2 diabetes mellitus with hyperglycemia: Secondary | ICD-10-CM

## 2015-12-31 DIAGNOSIS — R531 Weakness: Secondary | ICD-10-CM

## 2015-12-31 DIAGNOSIS — E785 Hyperlipidemia, unspecified: Secondary | ICD-10-CM

## 2015-12-31 DIAGNOSIS — E118 Type 2 diabetes mellitus with unspecified complications: Secondary | ICD-10-CM

## 2015-12-31 DIAGNOSIS — I10 Essential (primary) hypertension: Secondary | ICD-10-CM | POA: Diagnosis not present

## 2015-12-31 DIAGNOSIS — K219 Gastro-esophageal reflux disease without esophagitis: Secondary | ICD-10-CM | POA: Diagnosis not present

## 2015-12-31 DIAGNOSIS — E039 Hypothyroidism, unspecified: Secondary | ICD-10-CM

## 2015-12-31 DIAGNOSIS — I25810 Atherosclerosis of coronary artery bypass graft(s) without angina pectoris: Secondary | ICD-10-CM | POA: Diagnosis not present

## 2015-12-31 NOTE — Assessment & Plan Note (Signed)
Blood pressure controlled. Continue current medication.

## 2015-12-31 NOTE — Patient Instructions (Addendum)
.     prevnar vaccine administered today.   Medications reviewed and updated.  No changes recommended at this time.   Please schedule followup in 6 months

## 2015-12-31 NOTE — Assessment & Plan Note (Signed)
She does have some neuropathy Following with endocrine Diabetes has been well controlled Management per endocrinology

## 2015-12-31 NOTE — Assessment & Plan Note (Signed)
Following with cardiology Currently asymptomatic Continue current medication

## 2015-12-31 NOTE — Assessment & Plan Note (Signed)
Managed by endocrine.

## 2015-12-31 NOTE — Assessment & Plan Note (Addendum)
She has some generalized weakness, imbalance and has fallen several times This is likely multifactorial in nature She has discussed her spinal stenosis with neurology in the past and can discuss with him again, but at this point the most important thing for her to be constipated, is the physical therapy Advised her to take it is following-she has been very point has not had any major injuries, but stressed that she is at risk for breaking something or if she hits her head bleeding inside

## 2015-12-31 NOTE — Progress Notes (Signed)
Pre visit review using our clinic review tool, if applicable. No additional management support is needed unless otherwise documented below in the visit note. 

## 2015-12-31 NOTE — Progress Notes (Signed)
Subjective:    Patient ID: [name], female    DOB: 09-05-1925, 82 y.o.   MRN: [id number]  HPI She is here to establish with a new pcp. She is here with her daughter today.  She had her left knee replaced in august of 2016.  She went to rehab after the surgery.  She is still doing therapy.  She follows up with orthopedics next month.  Prior to the surgery she was living independently and was falling a lot.  It was recommended that she move in with her daughter and she did.  She still wants to move back to her home.  Since living with her daughter she has fallen once a month.  There has been no major injuries.  She is having difficulty with losing her independence. One of the main issues today was to discuss this.  She feels unbalanced. She understands that she has spinal stenosis and wonders if that is causing her falling. She also wonders how serious it is and whether symptoms she will experience. She is concerned about needing a wheelchair.  Diabetes with neuropathy: She follows with endocrine. Her diabetes has been well controlled. She is taking her medication daily as prescribed. She is compliant with a diabetic diet. Physical therapy is her exercise.  Coronary artery disease, hypertension: She follows with cardiology.  CVA: She has some dysarthria and word retreival difficulties at times. There is also some short-term memory difficulties. She is following with neurology.  GERD:  She is taking her medication daily as prescribed.  She denies any GERD symptoms and feels her GERD is well controlled.        Medications and allergies reviewed with patient and updated if appropriate.  Patient Active Problem List   Diagnosis Date Noted  . History of total knee arthroplasty 07/19/2015  . At risk for sleep apnea 07/17/2015  . Memory loss 04/05/2014  . Diabetes mellitus type 2 with complications, uncontrolled ([place]) 01/24/2014  . Chronic renal insufficiency, stage II (mild) 03/21/2013    . Encounter for long-term (current) use of other medications 07/29/2012  . Diabetes ([place]) 04/28/2012  . CVA (cerebral vascular accident) ([place])   . EDEMA- LOCALIZED 01/06/2011  . Chronic systolic heart failure ([place]) 12/26/2010  . GERD 04/30/2010  . TRANSIENT ISCHEMIC ATTACKS, HX OF 04/30/2010  . DYSPEPSIA 04/14/2010  . WEAKNESS 03/19/2010  . CONSTIPATION 02/13/2010  . ANEMIA, MILD 01/14/2010  . Hyperlipidemia 09/16/2008  . HYPERTENSION, BENIGN 09/16/2008  . CAD, AUTOLOGOUS BYPASS GRAFT 09/16/2008  . SICK SINUS/ TACHY-BRADY SYNDROME 09/16/2008  . PACEMAKER, PERMANENT 09/16/2008  . Coronary atherosclerosis 10/25/2007  . Hypothyroidism 06/14/2007  . COLONIC POLYPS, HX OF 03/23/2007    Current Outpatient Prescriptions on File Prior to Visit  Medication Sig Dispense Refill  . acarbose (PRECOSE) 50 MG tablet Take 1 tablet (50 mg total) by mouth 2 (two) times daily with a meal. 60 tablet 11  . acetaminophen (TYLENOL) 500 MG tablet Take 500 mg by mouth every 8 (eight) hours as needed. Take 2 tab = 1,000 mg PO Q 8 hours PRN    . amitriptyline (ELAVIL) 25 MG tablet TAKE 1 TABLET (25 MG TOTAL) BY MOUTH AT BEDTIME. 90 tablet 2  . apixaban (ELIQUIS) 2.5 MG TABS tablet Take 1 tablet (2.5 mg total) by mouth 2 (two) times daily. 60 tablet 11  . atorvastatin (LIPITOR) 20 MG tablet TAKE 1 TABLET BY MOUTH EVERY DAY (IN PLACE OF PRAVASTATIN) 30 tablet 2  . bromocriptine (PARLODEL)  2.5 MG tablet TAKE 1 TABLET (2.5 MG TOTAL) BY MOUTH AT BEDTIME. 30 tablet 10  . Calcium-Vitamin D 600-200 MG-UNIT per tablet Take 1 tablet by mouth daily.    . colesevelam (WELCHOL) 625 MG tablet Take 3 tablets (1,875 mg total) by mouth 2 (two) times daily before a meal.    . diltiazem (CARDIZEM) 30 MG tablet Take 1 tablet (30 mg total) by mouth every 6 (six) hours as needed. 120 tablet 3  . docusate sodium (COLACE) 100 MG capsule Take 100 mg by mouth 2 (two) times daily.    Marland Kitchen donepezil (ARICEPT) 10 MG tablet Take 1 tablet  (10 mg total) by mouth at bedtime. 90 tablet 3  . enalapril (VASOTEC) 20 MG tablet TAKE 1 TABLET BY MOUTH DAILY 90 tablet 1  . enalapril (VASOTEC) 20 MG tablet TAKE 1 TABLET BY MOUTH DAILY 90 tablet 0  . fish oil-omega-3 fatty acids 1000 MG capsule Take 1 g by mouth daily.    . INVOKANA 100 MG TABS tablet TAKE 1 TABLET BY MOUTH EVERY DAY 90 tablet 2  . levothyroxine (SYNTHROID, LEVOTHROID) 25 MCG tablet Take 25 mcg by mouth daily before breakfast. Take 1 1/2 tablets daily    . meclizine (ANTIVERT) 25 MG tablet Take 1 tablet (25 mg total) by mouth 3 (three) times daily as needed for dizziness. 20 tablet 0  . memantine (NAMENDA XR) 28 MG CP24 24 hr capsule Take 1 capsule (28 mg total) by mouth daily. 90 capsule 3  . metoprolol (TOPROL-XL) 200 MG 24 hr tablet TAKE 1 TABLET (200 MG TOTAL) BY MOUTH DAILY. 30 tablet 11  . MYRBETRIQ 25 MG TB24 tablet TAKE 1 TABLET (25 MG TOTAL) BY MOUTH DAILY. 90 tablet 0  . niacin 500 MG tablet Take 500 mg by mouth every evening.     . nitroGLYCERIN (NITROSTAT) 0.3 MG SL tablet Place 1 tablet (0.3 mg total) under the tongue every 5 (five) minutes as needed. For chest pain 90 tablet 3  . pantoprazole (PROTONIX) 40 MG tablet Take 1 tablet (40 mg total) by mouth daily. --patient has scheduled feb 7th appt to get established with new pcp-dr Tristyn Pharris 30 tablet 0  . repaglinide (PRANDIN) 2 MG tablet TAKE 1 TABLET (2 MG TOTAL) BY MOUTH 3 (THREE) TIMES DAILY BEFORE MEALS. 90 tablet 11  . sitaGLIPtin (JANUVIA) 100 MG tablet Take 100 mg by mouth daily. Take half tablet daily    . traMADol (ULTRAM) 50 MG tablet Take 1-2 tablets (50-100 mg total) by mouth every 6 (six) hours as needed for moderate pain. (Patient taking differently: Take 50 mg by mouth at bedtime. ) 80 tablet 0   Current Facility-Administered Medications on File Prior to Visit  Medication Dose Route Frequency Provider Last Rate Last Dose  . bupivacaine liposome (EXPAREL) 1.3 % injection 266 mg  20 mL Infiltration Once  Ardeen Jourdain, PA-C        Past Medical History  Diagnosis Date  . Sliding hiatal hernia   . Erosive esophagitis   . Peptic stricture of esophagus   . Diverticulosis   . Arthritis   . Hypothyroidism   . Diabetes mellitus     Type I  . Sick sinus syndrome [facility])     s/p Medtronic DDD pacemaker implanted; generator change 02/2012  . Hypertension   . Hyperlipidemia   . GERD (gastroesophageal reflux disease)   . Adenomatous colon polyp 07/2002  . Obesity   . Paroxysmal atrial fibrillation ([place]) 02/04/15  discovered on PPM remote interrogation 6/16  . Coronary artery disease     s/p CABG 2001 x 5  . CVA (cerebral vascular accident) [facility]) 2013 or 2014    Dr Evelena Leyden, Neurology  . Dementia   . History of swelling of feet     props up at night, circulations has been checked inlegs in past and was told it was ok    Past Surgical History  Procedure Laterality Date  . Pacemaker placement  03/25/2000    by Dr Olevia Perches  . Tonsillectomy and adenoidectomy    . Appendectomy    . Cholecystectomy    . Total abdominal hysterectomy w/ bilateral salpingoophorectomy      Endometiosis  . Replacement total knee  2004    Right  . Colonoscopy w/ polypectomy  2003  . Cataract extraction  05/2005  . Coronary artery bypass graft  02/2000    5 vessel  . Rotator cuff repair Right 2009  . Permanent pacemaker generator change  03/18/2012    PPM gen change by Dr Rayann Heman  . Total knee arthroplasty Left 07/19/2015    Procedure: LEFT TOTAL KNEE ARTHROPLASTY;  Surgeon: Latanya Maudlin, MD;  Location: [facility];  Service: Orthopedics;  Laterality: Left;    Social History   Social History  . Marital Status: Widowed    Spouse Name: N/A  . Number of Children: 1  . Years of Education: N/A   Occupational History  . Retired    Social History Main Topics  . Smoking status: Former Research scientist (life sciences)  . Smokeless tobacco: Former Systems developer    Quit date: 11/23/1981     Comment: quit 28+ yrs ago (as of 2012)  . Alcohol Use: No  .  Drug Use: No  . Sexual Activity: No   Other Topics Concern  . None   Social History Narrative   Patient lives at home alone. Patient's daughter was with her [name] ) [phone]   Retired.   Education college   Right handed.   Caffeine one cup of coffee daily.   .    Family History  Problem Relation Age of Onset  . Colon cancer Mother 74  . Breast cancer Mother   . Transient ischemic attack Mother   . Cancer Mother   . Hypertension Mother   . Stroke Mother   . Diabetes Mother   . Colon cancer Brother 44  . Cancer Brother   . Hypertension Brother   . Heart attack Brother   . Breast cancer Sister   . Cancer Sister   . Diabetes Sister   . Prostate cancer Brother   . Heart attack Brother   . Heart disease      3 brothers had MI; 1 pre 25  . Hypertension Sister   . Heart attack Daughter   . Diabetes Maternal Aunt     Review of Systems  Constitutional: Negative for fever.  HENT: Positive for congestion. Negative for sinus pressure and sore throat.   Respiratory: Positive for cough (Mild, dry). Negative for shortness of breath and wheezing.   Cardiovascular: Positive for leg swelling. Negative for chest pain and palpitations.  Gastrointestinal: Negative for abdominal pain.  Neurological: Negative for light-headedness and headaches.       Objective:   Filed Vitals:   12/31/15 1517  BP: 124/64  Pulse: 65  Temp: 97.6 F (36.4 C)  Resp: 16   Filed Weights   12/31/15 1517  Weight: 198 lb (89.812 kg)   Body mass  index is 32.44 kg/(m^2).   Physical Exam Constitutional: Appears well-developed and well-nourished. No distress.  Neck: Neck supple. No tracheal deviation present. No thyromegaly present.  No carotid bruit. No cervical adenopathy.   Cardiovascular: Normal rate, regular rhythm and normal heart sounds.   1/6 systolic murmur .  No edema Pulmonary/Chest: Effort normal and breath sounds normal. No respiratory distress. No wheezes.          Assessment & Plan:   Discussed with her and her daughter at length that is in her best interest to stay living with her daughter, which is where her daughter has been trying to convince her of. She understands this, but is still having a hard time with not having her independence  See Problem List for Assessment and Plan of chronic medical problems.  Follow-up in 6 months, sooner if needed

## 2015-12-31 NOTE — Assessment & Plan Note (Signed)
Taking atorvastatin 20 mg daily Continue same

## 2015-12-31 NOTE — Assessment & Plan Note (Signed)
Controlled Continue daily medication 

## 2016-01-01 ENCOUNTER — Ambulatory Visit: Payer: Medicare Other

## 2016-01-01 DIAGNOSIS — R2681 Unsteadiness on feet: Secondary | ICD-10-CM

## 2016-01-01 DIAGNOSIS — M25662 Stiffness of left knee, not elsewhere classified: Secondary | ICD-10-CM

## 2016-01-01 DIAGNOSIS — R269 Unspecified abnormalities of gait and mobility: Secondary | ICD-10-CM

## 2016-01-01 DIAGNOSIS — M545 Low back pain: Secondary | ICD-10-CM

## 2016-01-01 DIAGNOSIS — R29898 Other symptoms and signs involving the musculoskeletal system: Secondary | ICD-10-CM

## 2016-01-01 NOTE — Therapy (Signed)
[facility] [address] [place], [place], [address] Phone: [phone]   Fax:  [phone]  Physical Therapy Treatment  Patient Details  Name: [name] MRN: [id number] Date of Birth: 05/03/1925 Referring Provider: Dr. Latanya Maudlin  Encounter Date: 01/01/2016      PT End of Session - 01/01/16 1656    Visit Number 19   Number of Visits 29   Date for PT Re-Evaluation 01/29/16   Authorization Type 8 visits used in 2017  Use KX at visit 15 in 2017   PT Start Time 1614   PT Stop Time 1657   PT Time Calculation (min) 43 min      Past Medical History  Diagnosis Date  . Sliding hiatal hernia   . Erosive esophagitis   . Peptic stricture of esophagus   . Diverticulosis   . Arthritis   . Hypothyroidism   . Diabetes mellitus     Type I  . Sick sinus syndrome [facility])     s/p Medtronic DDD pacemaker implanted; generator change 02/2012  . Hypertension   . Hyperlipidemia   . GERD (gastroesophageal reflux disease)   . Adenomatous colon polyp 07/2002  . Obesity   . Paroxysmal atrial fibrillation ([place]) 02/04/15    discovered on PPM remote interrogation 6/16  . Coronary artery disease     s/p CABG 2001 x 5  . CVA (cerebral vascular accident) [facility]) 2013 or 2014    Dr Evelena Leyden, Neurology  . Dementia   . History of swelling of feet     props up at night, circulations has been checked inlegs in past and was told it was ok    Past Surgical History  Procedure Laterality Date  . Pacemaker placement  03/25/2000    by Dr Olevia Perches  . Tonsillectomy and adenoidectomy    . Appendectomy    . Cholecystectomy    . Total abdominal hysterectomy w/ bilateral salpingoophorectomy      Endometiosis  . Replacement total knee  2004    Right  . Colonoscopy w/ polypectomy  2003  . Cataract extraction  05/2005  . Coronary artery bypass graft  02/2000    5 vessel  . Rotator cuff repair Right 2009  . Permanent pacemaker generator change   03/18/2012    PPM gen change by Dr Rayann Heman  . Total knee arthroplasty Left 07/19/2015    Procedure: LEFT TOTAL KNEE ARTHROPLASTY;  Surgeon: Latanya Maudlin, MD;  Location: [facility];  Service: Orthopedics;  Laterality: Left;    There were no vitals filed for this visit.  Visit Diagnosis:  Knee stiffness, left  Abnormality of gait  Right low back pain, with sciatica presence unspecified  Weakness of both legs  Gait instability      Subjective Assessment - 01/01/16 1622    Subjective Pt is performing exercises at home.  Standing is limited by LBP and endurance deficits.   Currently in Pain? No/denies            [facility] PT Assessment - 01/01/16 0001    Assessment   Medical Diagnosis Z96.652 S/P total left knee replacement; Spinal stenosis of lumbar   Onset Date/Surgical Date 07/18/15   Precautions   Precautions ICD/Pacemaker   Precaution Comments Pace maker precautions   Prior Function   Level of Independence Independent with household mobility with device   Cognition   Overall Cognitive Status History of cognitive impairments - at baseline   Observation/Other Assessments   Focus  on Therapeutic Outcomes (FOTO)  48% limitation   lumbar                      [facility] Adult PT Treatment/Exercise - January 17, 2016 0001    Exercises   Exercises Knee/Hip   Lumbar Exercises: Standing   Other Standing Lumbar Exercises Standing upright at walker 10x    Knee/Hip Exercises: Aerobic   Nustep L3 x 10 min  seat #8, arms #9   Knee/Hip Exercises: Standing   Knee Flexion Strengthening;Both;2 sets;10 reps   Knee Flexion Limitations 2#   Hip Abduction AROM;Stengthening;Both;3 sets;10 reps;Knee straight  3# added   Knee/Hip Exercises: Seated   Long Arc Quad Strengthening;Both;3 sets;10 reps  2#    Long Arc Quad Weight 2 lbs.   Ball Squeeze 2x10    Marching AROM;Strengthening;Both;10 reps;3 sets  2# added for 2x10, then 0# for 1x10 due to weakness   Shoulder Exercises: Seated    Other Seated Exercises shoulder 3 way raises 1# 2x10   Moist Heat Therapy   Moist Heat Location Lumbar Spine  during sitting exercises on back                  PT Short Term Goals - 12/30/15 1557    PT SHORT TERM GOAL #1   Title be independent in initial HEP   Time 4   Period Weeks   Status Achieved   PT SHORT TERM GOAL #2   Title left knee PROM >/= 100 degrees   Time 4   Period Weeks   Status On-going   PT SHORT TERM GOAL #3   Title left knee strength 4-/5 so she can walk safely with walker   Time 4   Period Weeks   Status On-going           PT Long Term Goals - 01-17-2016 1637    PT LONG TERM GOAL #8   Title getting up and down from chair with 1 try due to increased strength and decreased pain with using her hands.    Time 8   Period Weeks   Status On-going  able to do in clinic today but this is inconsistent               Plan - Jan 17, 2016 1631    Clinical Impression Statement Pt with increased ease with sit to stand transition during treatment today.  FOTO score is improved to 48% limitation.  Pt demonstrates improved postural awareness during treatment.  Verbal cues needed for speed of exercise.  2# weights used today as pt is able to demonstrate better technique with this weight.  Pt LBP associated with spinal stenosis and this limits her standing tolerance.  Pt will continue to benefit from skilled PT for strength, endurance and gait/balance training to improve safety and independence.     Pt will benefit from skilled therapeutic intervention in order to improve on the following deficits Pain;Decreased strength;Decreased mobility;Decreased balance;Decreased activity tolerance;Decreased endurance;Difficulty walking;Decreased range of motion;Abnormal gait   Rehab Potential Good   PT Frequency 2x / week   PT Duration 8 weeks   PT Treatment/Interventions Moist Heat;Therapeutic activities;Patient/family education;Therapeutic exercise;Manual  techniques;Balance training;Functional mobility training;Cryotherapy;Neuromuscular re-education;Gait training;Stair training;Passive range of motion   PT Next Visit Plan LE strength, test TUG, endurance, gait   Consulted and Agree with Plan of Care Patient          G-Codes - 01/17/2016 1640    Functional Assessment Tool Used FOTO: 48% limitation  back   Functional Limitation Mobility: Walking and moving around   Mobility: Walking and Moving Around Current Status [phone]) At least 40 percent but less than 60 percent impaired, limited or restricted   Mobility: Walking and Moving Around Goal Status [phone]) At least 40 percent but less than 60 percent impaired, limited or restricted      Problem List Patient Active Problem List   Diagnosis Date Noted  . History of total knee arthroplasty 07/19/2015  . At risk for sleep apnea 07/17/2015  . Memory loss 04/05/2014  . Diabetes mellitus type 2 with complications, uncontrolled ([place]) 01/24/2014  . Chronic renal insufficiency, stage II (mild) 03/21/2013  . Encounter for long-term (current) use of other medications 07/29/2012  . Diabetes ([place]) 04/28/2012  . CVA (cerebral vascular accident) ([place])   . EDEMA- LOCALIZED 01/06/2011  . Chronic systolic heart failure ([place]) 12/26/2010  . GERD 04/30/2010  . TRANSIENT ISCHEMIC ATTACKS, HX OF 04/30/2010  . DYSPEPSIA 04/14/2010  . Generalized weakness 03/19/2010  . CONSTIPATION 02/13/2010  . ANEMIA, MILD 01/14/2010  . Hyperlipidemia 09/16/2008  . HYPERTENSION, BENIGN 09/16/2008  . CAD, AUTOLOGOUS BYPASS GRAFT 09/16/2008  . SICK SINUS/ TACHY-BRADY SYNDROME 09/16/2008  . PACEMAKER, PERMANENT 09/16/2008  . Coronary atherosclerosis 10/25/2007  . Hypothyroidism 06/14/2007  . COLONIC POLYPS, HX OF 03/23/2007    Jametta Moorehead, PT 01/01/2016, 4:58 PM  [facility] [address] [place], [place], [address] Phone: [phone]   Fax:   [phone]  Name: CRISPINA SHEFFIELD MRN: [id number] Date of Birth: Feb 16, 1925

## 2016-01-02 ENCOUNTER — Other Ambulatory Visit: Payer: Self-pay | Admitting: Podiatrist

## 2016-01-03 ENCOUNTER — Encounter: Payer: Self-pay | Admitting: Cardiology

## 2016-01-03 NOTE — Telephone Encounter (Signed)
I spoke with pt's dtr, [name], she states she doesn't think this medication has been filled but one time and is not need now.  [name] states she believes it would be good to cancel the Elavil.  I told her I would cancel the Elavil, and if pt did need the medication it should be managed by her PCP.  [name] states understanding and that pt has had knee surgery and is staying with her now.

## 2016-01-05 ENCOUNTER — Other Ambulatory Visit: Payer: Self-pay | Admitting: Internal Medicine

## 2016-01-07 ENCOUNTER — Encounter: Payer: Self-pay | Admitting: Physical Therapy

## 2016-01-07 ENCOUNTER — Ambulatory Visit: Payer: Medicare Other | Admitting: Physical Therapy

## 2016-01-07 DIAGNOSIS — R29898 Other symptoms and signs involving the musculoskeletal system: Secondary | ICD-10-CM

## 2016-01-07 DIAGNOSIS — R2681 Unsteadiness on feet: Secondary | ICD-10-CM

## 2016-01-07 DIAGNOSIS — M25662 Stiffness of left knee, not elsewhere classified: Secondary | ICD-10-CM | POA: Diagnosis not present

## 2016-01-07 DIAGNOSIS — R269 Unspecified abnormalities of gait and mobility: Secondary | ICD-10-CM

## 2016-01-07 DIAGNOSIS — M545 Low back pain: Secondary | ICD-10-CM

## 2016-01-07 NOTE — Therapy (Signed)
[facility] [address], [place], [place], [address] Phone: [phone]   Fax:  [phone]  Physical Therapy Treatment  Patient Details  Name: [name] MRN: [id number] Date of Birth: Mar 13, 1925 Referring Provider: Dr. Latanya Maudlin  Encounter Date: 01/07/2016      PT End of Session - 01/07/16 1117    Visit Number 20   Number of Visits 29   Date for PT Re-Evaluation 01/29/16   Authorization Type 9 visits used in 2017  Use KX at visit 15 in 2017   PT Start Time 1100   PT Stop Time 1145   PT Time Calculation (min) 45 min   Activity Tolerance Patient tolerated treatment well   Behavior During Therapy [facility] for tasks assessed/performed      Past Medical History  Diagnosis Date  . Sliding hiatal hernia   . Erosive esophagitis   . Peptic stricture of esophagus   . Diverticulosis   . Arthritis   . Hypothyroidism   . Diabetes mellitus     Type I  . Sick sinus syndrome [facility])     s/p Medtronic DDD pacemaker implanted; generator change 02/2012  . Hypertension   . Hyperlipidemia   . GERD (gastroesophageal reflux disease)   . Adenomatous colon polyp 07/2002  . Obesity   . Paroxysmal atrial fibrillation ([place]) 02/04/15    discovered on PPM remote interrogation 6/16  . Coronary artery disease     s/p CABG 2001 x 5  . CVA (cerebral vascular accident) [facility]) 2013 or 2014    Dr Evelena Leyden, Neurology  . Dementia   . History of swelling of feet     props up at night, circulations has been checked inlegs in past and was told it was ok    Past Surgical History  Procedure Laterality Date  . Pacemaker placement  03/25/2000    by Dr Olevia Perches  . Tonsillectomy and adenoidectomy    . Appendectomy    . Cholecystectomy    . Total abdominal hysterectomy w/ bilateral salpingoophorectomy      Endometiosis  . Replacement total knee  2004    Right  . Colonoscopy w/ polypectomy  2003  . Cataract extraction  05/2005  . Coronary artery  bypass graft  02/2000    5 vessel  . Rotator cuff repair Right 2009  . Permanent pacemaker generator change  03/18/2012    PPM gen change by Dr Rayann Heman  . Total knee arthroplasty Left 07/19/2015    Procedure: LEFT TOTAL KNEE ARTHROPLASTY;  Surgeon: Latanya Maudlin, MD;  Location: [facility];  Service: Orthopedics;  Laterality: Left;    There were no vitals filed for this visit.  Visit Diagnosis:  Knee stiffness, left  Abnormality of gait  Right low back pain, with sciatica presence unspecified  Weakness of both legs  Gait instability      Subjective Assessment - 01/07/16 1113    Subjective Pt reports she is more aware of her posture with standing and walking activities   Patient is accompained by: Family member   Pertinent History Rt TKA, CVA   Limitations Standing   How long can you stand comfortably? difficult   How long can you walk comfortably? needs a rolling walker   Patient Stated Goals increase left knee flexion; reduce pain   Currently in Pain? No/denies            [facility] PT Assessment - 01/07/16 0001    Timed Up and Go Test  TUG Normal TUG   Normal TUG (seconds) 51  1 trail 95sec, 2nd trail 51 sec, 3rd trail 12sec                     [facility] Adult PT Treatment/Exercise - 01/07/16 0001    Posture/Postural Control   Posture/Postural Control Postural limitations   Postural Limitations Rounded Shoulders;Forward head;Flexed trunk   Exercises   Exercises Knee/Hip   Lumbar Exercises: Standing   Other Standing Lumbar Exercises --   Knee/Hip Exercises: Aerobic   Nustep L3 x 10 min, seat 8, arms 9   Knee/Hip Exercises: Standing   Knee Flexion Strengthening;Both;2 sets;10 reps   Knee Flexion Limitations 3#   Hip Abduction AROM;Stengthening;Both;3 sets;10 reps;Knee straight  3# added   Knee/Hip Exercises: Seated   Long Arc Quad Strengthening;Both;3 sets;10 reps   Long Arc Quad Weight 2 lbs.   Ball Squeeze 2x10    Marching AROM;Strengthening;Both;10  reps;3 sets  2# added   Shoulder Exercises: Seated   Other Seated Exercises shoulder 3 way raises 1# 2x10   Moist Heat Therapy   Moist Heat Location Lumbar Spine  during exercises in sitting on back                  PT Short Term Goals - 01/07/16 1127    PT SHORT TERM GOAL #1   Title be independent in initial HEP   Time 4   Period Weeks   Status Achieved   PT SHORT TERM GOAL #2   Title left knee PROM >/= 100 degrees   Time 4   Period Weeks   Status On-going   PT SHORT TERM GOAL #3   Title left knee strength 4-/5 so she can walk safely with walker   Time 4   Period Weeks   Status On-going           PT Long Term Goals - 01/07/16 1127    PT LONG TERM GOAL #1   Title be independent in advanced HEP   Time 8   Period Weeks   Status On-going   PT LONG TERM GOAL #2   Title FOTO score is </= 61% limitation for knee   Time 8   Period Weeks   Status On-going   PT LONG TERM GOAL #3   Title Left knee flexion AROM >/= 105 degree to make it easier to walk   Time 8   Period Weeks   Status On-going   PT LONG TERM GOAL #4   Title left knee strength 4+/5 so patient is able to return home to live   Time 8   Period Weeks   Status On-going   PT LONG TERM GOAL #5   Title pain decreased >/= 50%    Time 8   Period Weeks   Status Achieved   PT LONG TERM GOAL #6   Title back pain with daily activities decresae >/= 50%    Time 8   Period Weeks   Status On-going   PT LONG TERM GOAL #7   Title TUG </= 50 seconds due to increased strength and endurance with rolling walker    Time 8   Period Weeks   Status On-going   PT LONG TERM GOAL #8   Title getting up and down from chair with 1 try due to increased strength and decreased pain with using her hands.    Time 8   Period Weeks   Status On-going  Plan - 01/07/16 1119    Clinical Impression Statement Pt presents with improved uprigth posture  with standing and walking and improved ease with sit  to stand. Pt is able to toleraet 3# with exercises, but needs reminders for technique. Pt LBP is associated with spinal stenosis and this limits her standing tolerance. Pt will continue to benefit from skilled PT for strength, endurance and gait/balance training to improve safety andindependence     Pt will benefit from skilled therapeutic intervention in order to improve on the following deficits Pain;Decreased strength;Decreased mobility;Decreased balance;Decreased activity tolerance;Decreased endurance;Difficulty walking;Decreased range of motion;Abnormal gait   Rehab Potential Good   Clinical Impairments Affecting Rehab Potential None   PT Frequency 2x / week   PT Duration 8 weeks   PT Next Visit Plan LE strength,  endurance, gait   PT Home Exercise Plan add gentle back strengthening   Consulted and Agree with Plan of Care Patient        Problem List Patient Active Problem List   Diagnosis Date Noted  . History of total knee arthroplasty 07/19/2015  . At risk for sleep apnea 07/17/2015  . Memory loss 04/05/2014  . Diabetes mellitus type 2 with complications, uncontrolled ([place]) 01/24/2014  . Chronic renal insufficiency, stage II (mild) 03/21/2013  . Encounter for long-term (current) use of other medications 07/29/2012  . Diabetes ([place]) 04/28/2012  . CVA (cerebral vascular accident) ([place])   . EDEMA- LOCALIZED 01/06/2011  . Chronic systolic heart failure ([place]) 12/26/2010  . GERD 04/30/2010  . TRANSIENT ISCHEMIC ATTACKS, HX OF 04/30/2010  . DYSPEPSIA 04/14/2010  . Generalized weakness 03/19/2010  . CONSTIPATION 02/13/2010  . ANEMIA, MILD 01/14/2010  . Hyperlipidemia 09/16/2008  . HYPERTENSION, BENIGN 09/16/2008  . CAD, AUTOLOGOUS BYPASS GRAFT 09/16/2008  . SICK SINUS/ TACHY-BRADY SYNDROME 09/16/2008  . PACEMAKER, PERMANENT 09/16/2008  . Coronary atherosclerosis 10/25/2007  . Hypothyroidism 06/14/2007  . COLONIC POLYPS, HX OF 03/23/2007    NAUMANN-HOUEGNIFIO,Brittiney Dicostanzo  PTA 01/07/2016, 11:45 AM  [facility] [address], [place], [place], [address] Phone: [phone]   Fax:  [phone]  Name: SOFIANA EICHORN MRN: [id number] Date of Birth: 09/27/1925

## 2016-01-09 ENCOUNTER — Encounter: Payer: Self-pay | Admitting: Physical Therapy

## 2016-01-09 ENCOUNTER — Ambulatory Visit: Payer: Medicare Other | Admitting: Physical Therapy

## 2016-01-09 DIAGNOSIS — M545 Low back pain: Secondary | ICD-10-CM

## 2016-01-09 DIAGNOSIS — R269 Unspecified abnormalities of gait and mobility: Secondary | ICD-10-CM

## 2016-01-09 DIAGNOSIS — R29898 Other symptoms and signs involving the musculoskeletal system: Secondary | ICD-10-CM

## 2016-01-09 DIAGNOSIS — M25662 Stiffness of left knee, not elsewhere classified: Secondary | ICD-10-CM

## 2016-01-09 DIAGNOSIS — R2681 Unsteadiness on feet: Secondary | ICD-10-CM

## 2016-01-09 NOTE — Therapy (Signed)
[facility] [address] [place], [place], [address] Phone: [phone]   Fax:  [phone]  Physical Therapy Treatment  Patient Details  Name: [name] MRN: [id number] Date of Birth: 20-Feb-1925 Referring Provider: Dr. Latanya Maudlin  Encounter Date: 01/09/2016      PT End of Session - 01/09/16 1637    Visit Number 21   Number of Visits 29   Date for PT Re-Evaluation 01/29/16   Authorization Type 10visits used in 2017  Use KX at visit 15 in 2017   PT Start Time 1608   PT Stop Time 1954   PT Time Calculation (min) 226 min   Activity Tolerance Patient tolerated treatment well   Behavior During Therapy [facility] for tasks assessed/performed      Past Medical History  Diagnosis Date  . Sliding hiatal hernia   . Erosive esophagitis   . Peptic stricture of esophagus   . Diverticulosis   . Arthritis   . Hypothyroidism   . Diabetes mellitus     Type I  . Sick sinus syndrome [facility])     s/p Medtronic DDD pacemaker implanted; generator change 02/2012  . Hypertension   . Hyperlipidemia   . GERD (gastroesophageal reflux disease)   . Adenomatous colon polyp 07/2002  . Obesity   . Paroxysmal atrial fibrillation ([place]) 02/04/15    discovered on PPM remote interrogation 6/16  . Coronary artery disease     s/p CABG 2001 x 5  . CVA (cerebral vascular accident) [facility]) 2013 or 2014    Dr Evelena Leyden, Neurology  . Dementia   . History of swelling of feet     props up at night, circulations has been checked inlegs in past and was told it was ok    Past Surgical History  Procedure Laterality Date  . Pacemaker placement  03/25/2000    by Dr Olevia Perches  . Tonsillectomy and adenoidectomy    . Appendectomy    . Cholecystectomy    . Total abdominal hysterectomy w/ bilateral salpingoophorectomy      Endometiosis  . Replacement total knee  2004    Right  . Colonoscopy w/ polypectomy  2003  . Cataract extraction  05/2005  . Coronary  artery bypass graft  02/2000    5 vessel  . Rotator cuff repair Right 2009  . Permanent pacemaker generator change  03/18/2012    PPM gen change by Dr Rayann Heman  . Total knee arthroplasty Left 07/19/2015    Procedure: LEFT TOTAL KNEE ARTHROPLASTY;  Surgeon: Latanya Maudlin, MD;  Location: [facility];  Service: Orthopedics;  Laterality: Left;    There were no vitals filed for this visit.  Visit Diagnosis:  Knee stiffness, left  Abnormality of gait  Right low back pain, with sciatica presence unspecified  Weakness of both legs  Gait instability  Weakness of left lower extremity      Subjective Assessment - 01/09/16 1632    Subjective Pt reports she is more aware of her posture with standing and walking activities   Patient is accompained by: Family member   Pertinent History Rt TKA, CVA   Limitations Standing   How long can you stand comfortably? difficult   How long can you walk comfortably? needs a rolling walker   Patient Stated Goals increase left knee flexion; reduce pain   Currently in Pain? No/denies  [place] Adult PT Treatment/Exercise - 01/09/16 0001    Ambulation/Gait   Ambulation/Gait Yes   Ambulation/Gait Assistance 6: Modified independent (Device/Increase time)   Ambulation Distance (Feet) 130 Feet   Assistive device Rolling walker   Gait Pattern Decreased step length - right;Decreased stance time - left;Decreased hip/knee flexion - left;Decreased dorsiflexion - left   Gait Comments Pt with improved trunk  extension   Posture/Postural Control   Posture/Postural Control Postural limitations   Postural Limitations Rounded Shoulders;Forward head;Flexed trunk   Exercises   Exercises Knee/Hip   Knee/Hip Exercises: Aerobic   Nustep L3 x 20 min, seat 8, arms 9   Knee/Hip Exercises: Standing   Knee Flexion Strengthening;Both;3 sets;10 reps   Knee Flexion Limitations 3#   Hip Abduction AROM;Stengthening;Both;3 sets;10 reps;Knee  straight   Knee/Hip Exercises: Seated   Long Arc Quad Strengthening;Both;3 sets;10 reps   Long Arc Quad Weight 2 lbs.   Ball Squeeze 3x10    Marching AROM;Strengthening;Both;10 reps;3 sets  2# added   Shoulder Exercises: Seated   Other Seated Exercises shoulder 3 way raises 1# 2x10                  PT Short Term Goals - 01/07/16 1127    PT SHORT TERM GOAL #1   Title be independent in initial HEP   Time 4   Period Weeks   Status Achieved   PT SHORT TERM GOAL #2   Title left knee PROM >/= 100 degrees   Time 4   Period Weeks   Status On-going   PT SHORT TERM GOAL #3   Title left knee strength 4-/5 so she can walk safely with walker   Time 4   Period Weeks   Status On-going           PT Long Term Goals - 01/07/16 1127    PT LONG TERM GOAL #1   Title be independent in advanced HEP   Time 8   Period Weeks   Status On-going   PT LONG TERM GOAL #2   Title FOTO score is </= 61% limitation for knee   Time 8   Period Weeks   Status On-going   PT LONG TERM GOAL #3   Title Left knee flexion AROM >/= 105 degree to make it easier to walk   Time 8   Period Weeks   Status On-going   PT LONG TERM GOAL #4   Title left knee strength 4+/5 so patient is able to return home to live   Time 8   Period Weeks   Status On-going   PT LONG TERM GOAL #5   Title pain decreased >/= 50%    Time 8   Period Weeks   Status Achieved   PT LONG TERM GOAL #6   Title back pain with daily activities decresae >/= 50%    Time 8   Period Weeks   Status On-going   PT LONG TERM GOAL #7   Title TUG </= 50 seconds due to increased strength and endurance with rolling walker    Time 8   Period Weeks   Status On-going   PT LONG TERM GOAL #8   Title getting up and down from chair with 1 try due to increased strength and decreased pain with using her hands.    Time 8   Period Weeks   Status On-going               Plan - 01/09/16 1639  Clinical Impression Statement Pt  continues to presents with improved posture with standing and walking and transfers sit to stand improved. Pt wil continue to benefit from skilled PT for strength, endurance and gait/balance training to improve safety   Pt will benefit from skilled therapeutic intervention in order to improve on the following deficits Pain;Decreased strength;Decreased mobility;Decreased balance;Decreased activity tolerance;Decreased endurance;Difficulty walking;Decreased range of motion;Abnormal gait   Rehab Potential Good   Clinical Impairments Affecting Rehab Potential LBP is associated with spinal stenosis and limits standing tolerance.    PT Frequency 2x / week   PT Duration 8 weeks   PT Treatment/Interventions Moist Heat;Therapeutic activities;Patient/family education;Therapeutic exercise;Manual techniques;Balance training;Functional mobility training;Cryotherapy;Neuromuscular re-education;Gait training;Stair training;Passive range of motion   PT Next Visit Plan LE strength,  endurance, gait   PT Home Exercise Plan add gentle back strengthening   Consulted and Agree with Plan of Care Patient        Problem List Patient Active Problem List   Diagnosis Date Noted  . History of total knee arthroplasty 07/19/2015  . At risk for sleep apnea 07/17/2015  . Memory loss 04/05/2014  . Diabetes mellitus type 2 with complications, uncontrolled ([place]) 01/24/2014  . Chronic renal insufficiency, stage II (mild) 03/21/2013  . Encounter for long-term (current) use of other medications 07/29/2012  . Diabetes ([place]) 04/28/2012  . CVA (cerebral vascular accident) ([place])   . EDEMA- LOCALIZED 01/06/2011  . Chronic systolic heart failure ([place]) 12/26/2010  . GERD 04/30/2010  . TRANSIENT ISCHEMIC ATTACKS, HX OF 04/30/2010  . DYSPEPSIA 04/14/2010  . Generalized weakness 03/19/2010  . CONSTIPATION 02/13/2010  . ANEMIA, MILD 01/14/2010  . Hyperlipidemia 09/16/2008  . HYPERTENSION, BENIGN 09/16/2008  . CAD, AUTOLOGOUS  BYPASS GRAFT 09/16/2008  . SICK SINUS/ TACHY-BRADY SYNDROME 09/16/2008  . PACEMAKER, PERMANENT 09/16/2008  . Coronary atherosclerosis 10/25/2007  . Hypothyroidism 06/14/2007  . COLONIC POLYPS, HX OF 03/23/2007    NAUMANN-HOUEGNIFIO,Keondra Haydu PTA 01/09/2016, 5:07 PM  [facility] [address] [place], [place], [address] Phone: [phone]   Fax:  [phone]  Name: Kathy DENONCOURT MRN: [id number] Date of Birth: May 11, 1925

## 2016-01-13 ENCOUNTER — Ambulatory Visit: Payer: Medicare Other | Admitting: Physical Therapy

## 2016-01-13 ENCOUNTER — Encounter: Payer: Self-pay | Admitting: Physical Therapy

## 2016-01-13 DIAGNOSIS — R2681 Unsteadiness on feet: Secondary | ICD-10-CM

## 2016-01-13 DIAGNOSIS — M545 Low back pain: Secondary | ICD-10-CM

## 2016-01-13 DIAGNOSIS — M7989 Other specified soft tissue disorders: Secondary | ICD-10-CM

## 2016-01-13 DIAGNOSIS — M25662 Stiffness of left knee, not elsewhere classified: Secondary | ICD-10-CM

## 2016-01-13 DIAGNOSIS — R29898 Other symptoms and signs involving the musculoskeletal system: Secondary | ICD-10-CM

## 2016-01-13 DIAGNOSIS — R269 Unspecified abnormalities of gait and mobility: Secondary | ICD-10-CM

## 2016-01-13 NOTE — Therapy (Signed)
[facility] [address], [place], [place], [address] Phone: [phone]   Fax:  [phone]  Physical Therapy Treatment  Patient Details  Name: [name] MRN: [id number] Date of Birth: Mar 12, 1925 Referring Provider: Dr. Latanya Maudlin  Encounter Date: 01/13/2016      PT End of Session - 01/13/16 1650    Visit Number 22  visit 11 in 2017   Number of Visits 29   Date for PT Re-Evaluation 01/29/16   Authorization Type 11 visits used in 2017  Use KX at visit 15 in 2017   PT Start Time 1611   PT Stop Time 1658   PT Time Calculation (min) 47 min   Activity Tolerance Patient tolerated treatment well   Behavior During Therapy [facility] for tasks assessed/performed      Past Medical History  Diagnosis Date  . Sliding hiatal hernia   . Erosive esophagitis   . Peptic stricture of esophagus   . Diverticulosis   . Arthritis   . Hypothyroidism   . Diabetes mellitus     Type I  . Sick sinus syndrome [facility])     s/p Medtronic DDD pacemaker implanted; generator change 02/2012  . Hypertension   . Hyperlipidemia   . GERD (gastroesophageal reflux disease)   . Adenomatous colon polyp 07/2002  . Obesity   . Paroxysmal atrial fibrillation ([place]) 02/04/15    discovered on PPM remote interrogation 6/16  . Coronary artery disease     s/p CABG 2001 x 5  . CVA (cerebral vascular accident) [facility]) 2013 or 2014    Dr Evelena Leyden, Neurology  . Dementia   . History of swelling of feet     props up at night, circulations has been checked inlegs in past and was told it was ok    Past Surgical History  Procedure Laterality Date  . Pacemaker placement  03/25/2000    by Dr Olevia Perches  . Tonsillectomy and adenoidectomy    . Appendectomy    . Cholecystectomy    . Total abdominal hysterectomy w/ bilateral salpingoophorectomy      Endometiosis  . Replacement total knee  2004    Right  . Colonoscopy w/ polypectomy  2003  . Cataract extraction  05/2005   . Coronary artery bypass graft  02/2000    5 vessel  . Rotator cuff repair Right 2009  . Permanent pacemaker generator change  03/18/2012    PPM gen change by Dr Rayann Heman  . Total knee arthroplasty Left 07/19/2015    Procedure: LEFT TOTAL KNEE ARTHROPLASTY;  Surgeon: Latanya Maudlin, MD;  [facility];  Service: Orthopedics;  Laterality: Left;    There were no vitals filed for this visit.  Visit Diagnosis:  Knee stiffness, left  Abnormality of gait  Right low back pain, with sciatica presence unspecified  Weakness of both legs  Gait instability  Weakness of left lower extremity  Swelling of limb      Subjective Assessment - 01/13/16 1623    Subjective Pt reports she is more aware of her posture with standing and walking with her rolling walker   Patient is accompained by: Family member   Pertinent History Rt TKA, CVA   Limitations Standing   How long can you stand comfortably? difficult   How long can you walk comfortably? needs a rolling walker   Patient Stated Goals increase left knee flexion; reduce pain   Currently in Pain? No/denies   Multiple Pain Sites No  [place] Adult PT Treatment/Exercise - 01/13/16 0001    Ambulation/Gait   Ambulation/Gait Yes   Ambulation/Gait Assistance 6: Modified independent (Device/Increase time)   Ambulation Distance (Feet) 130 Feet   Assistive device Rolling walker   Gait Pattern Decreased step length - right;Decreased stance time - left;Decreased hip/knee flexion - left;Decreased dorsiflexion - left   Gait Comments Focused on increasing step length on right, challenging for pt   Posture/Postural Control   Posture/Postural Control Postural limitations   Postural Limitations Rounded Shoulders;Forward head;Flexed trunk   Exercises   Exercises Knee/Hip   Lumbar Exercises: Standing   Other Standing Lumbar Exercises Standing upright at walker 10x    Knee/Hip Exercises: Stretches   Quad Stretch  Left  x 10 to increase ROM    Knee/Hip Exercises: Aerobic   Nustep L3 x 10 min, seat 8, arms 9   Knee/Hip Exercises: Standing   Knee Flexion Strengthening;Both;3 sets;10 reps   Knee Flexion Limitations 3#   Hip Abduction AROM;Stengthening;Both;3 sets;10 reps;Knee straight  3# added   Knee/Hip Exercises: Seated   Long Arc Quad Strengthening;Both;3 sets;10 reps   Long Arc Quad Weight 2 lbs.   Ball Squeeze 2x10   Marching AROM;Strengthening;Both;10 reps;3 sets  3# added   Shoulder Exercises: Seated   Other Seated Exercises shoulder 3 way raises 1# 2x10                  PT Short Term Goals - 01/13/16 1703    PT SHORT TERM GOAL #1   Title be independent in initial HEP   Time 4   Period Weeks   Status Achieved   PT SHORT TERM GOAL #2   Title left knee PROM >/= 100 degrees   Time 4   Period Weeks   Status On-going   PT SHORT TERM GOAL #3   Title left knee strength 4-/5 so she can walk safely with walker   Time 4   Period Weeks   Status On-going           PT Long Term Goals - 01/07/16 1127    PT LONG TERM GOAL #1   Title be independent in advanced HEP   Time 8   Period Weeks   Status On-going   PT LONG TERM GOAL #2   Title FOTO score is </= 61% limitation for knee   Time 8   Period Weeks   Status On-going   PT LONG TERM GOAL #3   Title Left knee flexion AROM >/= 105 degree to make it easier to walk   Time 8   Period Weeks   Status On-going   PT LONG TERM GOAL #4   Title left knee strength 4+/5 so patient is able to return home to live   Time 8   Period Weeks   Status On-going   PT LONG TERM GOAL #5   Title pain decreased >/= 50%    Time 8   Period Weeks   Status Achieved   PT LONG TERM GOAL #6   Title back pain with daily activities decresae >/= 50%    Time 8   Period Weeks   Status On-going   PT LONG TERM GOAL #7   Title TUG </= 50 seconds due to increased strength and endurance with rolling walker    Time 8   Period Weeks   Status  On-going   PT LONG TERM GOAL #8   Title getting up and down from chair with 1 try due  to increased strength and decreased pain with using her hands.    Time 8   Period Weeks   Status On-going               Plan - 01/13/16 1652    Clinical Impression Statement Pt with improved strength on left with hamstring curls and improved endurance with UE activities. Pt will continue to benefit from skilled PT for strength, endurance and gait/balance training to improve safety   Pt will benefit from skilled therapeutic intervention in order to improve on the following deficits Pain;Decreased strength;Decreased mobility;Decreased balance;Decreased activity tolerance;Decreased endurance;Difficulty walking;Decreased range of motion;Abnormal gait   Rehab Potential Good   Clinical Impairments Affecting Rehab Potential LBP is associated with spinal stenosis and limits standing tolerance.    PT Frequency 2x / week   PT Duration 8 weeks   PT Treatment/Interventions Moist Heat;Therapeutic activities;Patient/family education;Therapeutic exercise;Manual techniques;Balance training;Functional mobility training;Cryotherapy;Neuromuscular re-education;Gait training;Stair training;Passive range of motion   PT Next Visit Plan LE strength, ROM flexion left knee, endurance, gait   PT Home Exercise Plan add gentle back strengthening   Consulted and Agree with Plan of Care Patient        Problem List Patient Active Problem List   Diagnosis Date Noted  . History of total knee arthroplasty 07/19/2015  . At risk for sleep apnea 07/17/2015  . Memory loss 04/05/2014  . Diabetes mellitus type 2 with complications, uncontrolled ([place]) 01/24/2014  . Chronic renal insufficiency, stage II (mild) 03/21/2013  . Encounter for long-term (current) use of other medications 07/29/2012  . Diabetes ([place]) 04/28/2012  . CVA (cerebral vascular accident) ([place])   . EDEMA- LOCALIZED 01/06/2011  . Chronic systolic heart failure  ([place]) 12/26/2010  . GERD 04/30/2010  . TRANSIENT ISCHEMIC ATTACKS, HX OF 04/30/2010  . DYSPEPSIA 04/14/2010  . Generalized weakness 03/19/2010  . CONSTIPATION 02/13/2010  . ANEMIA, MILD 01/14/2010  . Hyperlipidemia 09/16/2008  . HYPERTENSION, BENIGN 09/16/2008  . CAD, AUTOLOGOUS BYPASS GRAFT 09/16/2008  . SICK SINUS/ TACHY-BRADY SYNDROME 09/16/2008  . PACEMAKER, PERMANENT 09/16/2008  . Coronary atherosclerosis 10/25/2007  . Hypothyroidism 06/14/2007  . COLONIC POLYPS, HX OF 03/23/2007    NAUMANN-HOUEGNIFIO,Taysen Bushart PTA 01/13/2016, 5:11 PM  [facility] [address] [place], [place], [address] Phone: [phone]   Fax:  [phone]  Name: MAYO HORNACEK MRN: [id number] Date of Birth: 11-Jan-1925

## 2016-01-14 ENCOUNTER — Ambulatory Visit (INDEPENDENT_AMBULATORY_CARE_PROVIDER_SITE_OTHER): Payer: Medicare Other | Admitting: *Deleted

## 2016-01-14 DIAGNOSIS — I480 Paroxysmal atrial fibrillation: Secondary | ICD-10-CM

## 2016-01-14 NOTE — Progress Notes (Signed)
Pt was started on Eliquis 2.5mg  bid  for Atrial Fib on 05/20/2015.    Reviewed patients medication list.  Pt is not currently on any combined P-gp and strong CYP3A4 inhibitors/inducers (ketoconazole, traconazole, ritonavir, carbamazepine, phenytoin, rifampin, St. John's wort).  Reviewed labs.  SCr 1.45 , Weight  87.32kg,   Dose is appropriate based on age weight and SrCr.   Hgb 12.1 and HCT 38.1  A full discussion of the nature of anticoagulants has been carried out.  A benefit/risk analysis has been presented to the patient, so that they understand the justification for choosing anticoagulation with Eliquis at this time.  The need for compliance is stressed.  Pt is aware to take the medication twice daily.  Side effects of potential bleeding are discussed, including unusual colored urine or stools, coughing up blood or coffee ground emesis, nose bleeds or serious fall or head trauma.  Discussed signs and symptoms of stroke. The patient should avoid any OTC items containing aspirin or ibuprofen.  Avoid alcohol consumption.   Call if any signs of abnormal bleeding.  Discussed financial obligations and not having any  difficulty in obtaining medication.  Next lab test test in  2 months.  Pt states has had no sign or symptom of bleeding or sign or symptom of stroke. States has fallen legs gave away has spinal stenosis and states has had some dizziness   Pt does state has missed couple doses of Eliquis Will obtain cbc and bmet today and will call with results Spoke with Elberta Leatherwood PharmD and she states continue same dose Eliquis 2.5mg  bid and recheck labs in 2 months Spoke with Edd Fabian pt's daughter and instructed to continue Eliquis 2.5mg  bid 1 every 12 hours and to see her back in 2 months and have repeat cbc and bmet done and Edd Fabian verbalized understanding and appt made for visit in 2 months

## 2016-01-15 ENCOUNTER — Ambulatory Visit: Payer: Medicare Other | Admitting: Physical Therapy

## 2016-01-15 LAB — BASIC METABOLIC PANEL
BUN: 30 mg/dL — ABNORMAL HIGH (ref 7–25)
CALCIUM: 9.9 mg/dL (ref 8.6–10.4)
CHLORIDE: 101 mmol/L (ref 98–110)
CO2: 23 mmol/L (ref 20–31)
Creat: 1.45 mg/dL — ABNORMAL HIGH (ref 0.60–0.88)
Glucose, Bld: 150 mg/dL — ABNORMAL HIGH (ref 65–99)
Potassium: 4.8 mmol/L (ref 3.5–5.3)
SODIUM: 138 mmol/L (ref 135–146)

## 2016-01-15 LAB — CBC
HCT: 38.1 % (ref 36.0–46.0)
Hemoglobin: 12.1 g/dL (ref 12.0–15.0)
MCH: 27.5 pg (ref 26.0–34.0)
MCHC: 31.8 g/dL (ref 30.0–36.0)
MCV: 86.6 fL (ref 78.0–100.0)
MPV: 11.7 fL (ref 8.6–12.4)
PLATELETS: 186 10*3/uL (ref 150–400)
RBC: 4.4 MIL/uL (ref 3.87–5.11)
RDW: 14.8 % (ref 11.5–15.5)
WBC: 6.7 10*3/uL (ref 4.0–10.5)

## 2016-01-16 ENCOUNTER — Other Ambulatory Visit: Payer: Self-pay | Admitting: Endocrinology

## 2016-01-20 ENCOUNTER — Encounter: Payer: Self-pay | Admitting: Physical Therapy

## 2016-01-20 ENCOUNTER — Ambulatory Visit: Payer: Medicare Other | Admitting: Physical Therapy

## 2016-01-20 DIAGNOSIS — R2681 Unsteadiness on feet: Secondary | ICD-10-CM

## 2016-01-20 DIAGNOSIS — R269 Unspecified abnormalities of gait and mobility: Secondary | ICD-10-CM

## 2016-01-20 DIAGNOSIS — M25662 Stiffness of left knee, not elsewhere classified: Secondary | ICD-10-CM | POA: Diagnosis not present

## 2016-01-20 DIAGNOSIS — M545 Low back pain: Secondary | ICD-10-CM

## 2016-01-20 DIAGNOSIS — R29898 Other symptoms and signs involving the musculoskeletal system: Secondary | ICD-10-CM

## 2016-01-20 NOTE — Therapy (Signed)
[facility] [address], [place], [place], [address] Phone: [phone]   Fax:  [phone]  Physical Therapy Treatment  Patient Details  Name: [name] MRN: [id number] Date of Birth: [birth year] Referring Provider: Dr. Latanya Maudlin  Encounter Date: 01/20/2016      PT End of Session - 01/20/16 1632    Visit Number 23  12 visit in 2017   Number of Visits 29   Date for PT Re-Evaluation 01/29/16   Authorization Type 12 visits used in 2017  Use KX at visit 15 in 2017   PT Start Time 1612   PT Stop Time 1658   PT Time Calculation (min) 46 min   Activity Tolerance Patient tolerated treatment well   Behavior During Therapy [facility] for tasks assessed/performed      Past Medical History  Diagnosis Date  . Sliding hiatal hernia   . Erosive esophagitis   . Peptic stricture of esophagus   . Diverticulosis   . Arthritis   . Hypothyroidism   . Diabetes mellitus     Type I  . Sick sinus syndrome [facility])     s/p Medtronic DDD pacemaker implanted; generator change 02/2012  . Hypertension   . Hyperlipidemia   . GERD (gastroesophageal reflux disease)   . Adenomatous colon polyp 07/2002  . Obesity   . Paroxysmal atrial fibrillation ([employer]) 02/04/15    discovered on PPM remote interrogation 6/16  . Coronary artery disease     s/p CABG 2001 x 5  . CVA (cerebral vascular accident) [facility]) 2013 or 2014    Dr Evelena Leyden, Neurology  . Dementia   . History of swelling of feet     props up at night, circulations has been checked inlegs in past and was told it was ok    Past Surgical History  Procedure Laterality Date  . Pacemaker placement  03/25/2000    by Dr Olevia Perches  . Tonsillectomy and adenoidectomy    . Appendectomy    . Cholecystectomy    . Total abdominal hysterectomy w/ bilateral salpingoophorectomy      Endometiosis  . Replacement total knee  2004    Right  . Colonoscopy w/ polypectomy  2003  . Cataract extraction  05/2005   . Coronary artery bypass graft  02/2000    5 vessel  . Rotator cuff repair Right 2009  . Permanent pacemaker generator change  03/18/2012    PPM gen change by Dr Rayann Heman  . Total knee arthroplasty Left 07/19/2015    Procedure: LEFT TOTAL KNEE ARTHROPLASTY;  Surgeon: Latanya Maudlin, MD;  Location: [facility];  Service: Orthopedics;  Laterality: Left;    There were no vitals filed for this visit.  Visit Diagnosis:  Abnormality of gait  Knee stiffness, left  Right low back pain, with sciatica presence unspecified  Weakness of both legs  Gait instability  Weakness of left lower extremity      Subjective Assessment - 01/20/16 1624    Subjective Pt dtr reports she was out and walked a little with the walker in the neighborhood.   Patient is accompained by: Family member   Pertinent History Rt TKA, CVA   Limitations Standing   How long can you stand comfortably? difficult   How long can you walk comfortably? needs a rolling walker   Patient Stated Goals increase left knee flexion; reduce pain   Currently in Pain? No/denies   Pain Onset More than a month ago  [place] Adult PT Treatment/Exercise - 01/20/16 0001    Ambulation/Gait   Ambulation/Gait Yes   Ambulation/Gait Assistance 6: Modified independent (Device/Increase time)   Ambulation Distance (Feet) 130 Feet   Assistive device Rolling walker   Gait Pattern Decreased step length - right;Decreased stance time - left;Decreased hip/knee flexion - left;Decreased dorsiflexion - left   Gait Comments Focused on increasing step length on right, challenging for pt   Posture/Postural Control   Posture/Postural Control Postural limitations   Postural Limitations Rounded Shoulders;Forward head;Flexed trunk   Exercises   Exercises Knee/Hip   Lumbar Exercises: Standing   Other Standing Lumbar Exercises Standing upright at walker 10x    Knee/Hip Exercises: Stretches   Quad Stretch Left  x 10 in left  knee   Knee/Hip Exercises: Aerobic   Nustep L3 x 10 min, seat 8, arms 9  0.93mh   Knee/Hip Exercises: Standing   Knee Flexion Strengthening;Both;3 sets;10 reps   Knee Flexion Limitations 3#   Hip Abduction AROM;Stengthening;Both;3 sets;10 reps;Knee straight  3# added   Knee/Hip Exercises: Seated   Long Arc Quad Strengthening;Both;3 sets;10 reps   Long Arc Quad Weight 2 lbs.   Ball Squeeze 2x10   Marching AROM;Strengthening;Both;10 reps;3 sets  3# added   Shoulder Exercises: Seated   Other Seated Exercises shoulder 3 way raises 1# 2x10   Moist Heat Therapy   Moist Heat Location Lumbar Spine  during exercise in sitting on back                   PT Short Term Goals - 01/20/16 1635    PT SHORT TERM GOAL #1   Title be independent in initial HEP   Time 4   Period Weeks   Status Achieved   PT SHORT TERM GOAL #2   Title left knee PROM >/= 100 degrees  95 degrees in sitting   Time 4   Period Weeks   Status Partially Met   PT SHORT TERM GOAL #3   Title left knee strength 4-/5 so she can walk safely with walker   Time 4   Period Weeks   Status On-going           PT Long Term Goals - 01/07/16 1127    PT LONG TERM GOAL #1   Title be independent in advanced HEP   Time 8   Period Weeks   Status On-going   PT LONG TERM GOAL #2   Title FOTO score is </= 61% limitation for knee   Time 8   Period Weeks   Status On-going   PT LONG TERM GOAL #3   Title Left knee flexion AROM >/= 105 degree to make it easier to walk   Time 8   Period Weeks   Status On-going   PT LONG TERM GOAL #4   Title left knee strength 4+/5 so patient is able to return home to live   Time 8   Period Weeks   Status On-going   PT LONG TERM GOAL #5   Title pain decreased >/= 50%    Time 8   Period Weeks   Status Achieved   PT LONG TERM GOAL #6   Title back pain with daily activities decresae >/= 50%    Time 8   Period Weeks   Status On-going   PT LONG TERM GOAL #7   Title TUG </= 50  seconds due to increased strength and endurance with rolling walker    Time 8  Period Weeks   Status On-going   PT LONG TERM GOAL #8   Title getting up and down from chair with 1 try due to increased strength and decreased pain with using her hands.    Time 8   Period Weeks   Status On-going               Plan - 01/20/16 1634    Clinical Impression Statement Pt continues to improve with strength and endurance. Pt will continue to benefit from skilled PT for strength, enduracne and gait/balance training to improve safety   Pt will benefit from skilled therapeutic intervention in order to improve on the following deficits Pain;Decreased strength;Decreased mobility;Decreased balance;Decreased activity tolerance;Decreased endurance;Difficulty walking;Decreased range of motion;Abnormal gait   Rehab Potential Good   Clinical Impairments Affecting Rehab Potential LBP is associated with spinal stenosis and limits standing tolerance.    PT Frequency 2x / week   PT Duration 8 weeks   PT Treatment/Interventions Moist Heat;Therapeutic activities;Patient/family education;Therapeutic exercise;Manual techniques;Balance training;Functional mobility training;Cryotherapy;Neuromuscular re-education;Gait training;Stair training;Passive range of motion   PT Next Visit Plan LE strength, ROM flexion left knee, endurance, gait   PT Home Exercise Plan add gentle back strengthening   Consulted and Agree with Plan of Care Patient        Problem List Patient Active Problem List   Diagnosis Date Noted  . History of total knee arthroplasty 07/19/2015  . At risk for sleep apnea 07/17/2015  . Memory loss 04/05/2014  . Diabetes mellitus type 2 with complications, uncontrolled ([place]) 01/24/2014  . Chronic renal insufficiency, stage II (mild) 03/21/2013  . Encounter for long-term (current) use of other medications 07/29/2012  . Diabetes ([place]) 04/28/2012  . CVA (cerebral vascular accident) ([place])   . EDEMA-  LOCALIZED 01/06/2011  . Chronic systolic heart failure ([place]) 12/26/2010  . GERD 04/30/2010  . TRANSIENT ISCHEMIC ATTACKS, HX OF 04/30/2010  . DYSPEPSIA 04/14/2010  . Generalized weakness 03/19/2010  . CONSTIPATION 02/13/2010  . ANEMIA, MILD 01/14/2010  . Hyperlipidemia 09/16/2008  . HYPERTENSION, BENIGN 09/16/2008  . CAD, AUTOLOGOUS BYPASS GRAFT 09/16/2008  . SICK SINUS/ TACHY-BRADY SYNDROME 09/16/2008  . PACEMAKER, PERMANENT 09/16/2008  . Coronary atherosclerosis 10/25/2007  . Hypothyroidism 06/14/2007  . COLONIC POLYPS, HX OF 03/23/2007    NAUMANN-HOUEGNIFIO,[name] PTA 01/20/2016, 5:00 PM  [facility] [address] [place], [place], [address] Phone: [phone]   Fax:  [phone]  Name: [name] MRN: [id number] Date of Birth: 12/20/1924

## 2016-01-22 ENCOUNTER — Ambulatory Visit: Payer: Medicare Other | Attending: Orthopedic Surgery

## 2016-01-22 DIAGNOSIS — R2681 Unsteadiness on feet: Secondary | ICD-10-CM | POA: Insufficient documentation

## 2016-01-22 DIAGNOSIS — M25662 Stiffness of left knee, not elsewhere classified: Secondary | ICD-10-CM | POA: Insufficient documentation

## 2016-01-22 DIAGNOSIS — R269 Unspecified abnormalities of gait and mobility: Secondary | ICD-10-CM

## 2016-01-22 DIAGNOSIS — R29898 Other symptoms and signs involving the musculoskeletal system: Secondary | ICD-10-CM | POA: Insufficient documentation

## 2016-01-22 DIAGNOSIS — M545 Low back pain: Secondary | ICD-10-CM | POA: Diagnosis present

## 2016-01-22 NOTE — Therapy (Signed)
[facility] [address] [place], [place], [address] Phone: [phone]   Fax:  [phone]  Physical Therapy Treatment  Patient Details  Name: [name] MRN: [id number] Date of Birth: 1925-04-15 Referring Provider: Dr. Latanya Maudlin  Encounter Date: 01/22/2016      PT End of Session - 01/22/16 1647    Visit Number 24  13 in 2017   Number of Visits 29   Date for PT Re-Evaluation 01/29/16   Authorization Type 13 visit used in 2017, KX at visit 15 in 2017   PT Start Time 1604   PT Stop Time 1647   PT Time Calculation (min) 43 min   Activity Tolerance Patient tolerated treatment well   Behavior During Therapy [facility] for tasks assessed/performed      Past Medical History  Diagnosis Date  . Sliding hiatal hernia   . Erosive esophagitis   . Peptic stricture of esophagus   . Diverticulosis   . Arthritis   . Hypothyroidism   . Diabetes mellitus     Type I  . Sick sinus syndrome [facility])     s/p Medtronic DDD pacemaker implanted; generator change 02/2012  . Hypertension   . Hyperlipidemia   . GERD (gastroesophageal reflux disease)   . Adenomatous colon polyp 07/2002  . Obesity   . Paroxysmal atrial fibrillation ([place]) 02/04/15    discovered on PPM remote interrogation 6/16  . Coronary artery disease     s/p CABG 2001 x 5  . CVA (cerebral vascular accident) [facility]) 2013 or 2014    Dr Evelena Leyden, Neurology  . Dementia   . History of swelling of feet     props up at night, circulations has been checked inlegs in past and was told it was ok    Past Surgical History  Procedure Laterality Date  . Pacemaker placement  03/25/2000    by Dr Olevia Perches  . Tonsillectomy and adenoidectomy    . Appendectomy    . Cholecystectomy    . Total abdominal hysterectomy w/ bilateral salpingoophorectomy      Endometiosis  . Replacement total knee  2004    Right  . Colonoscopy w/ polypectomy  2003  . Cataract extraction  05/2005  .  Coronary artery bypass graft  02/2000    5 vessel  . Rotator cuff repair Right 2009  . Permanent pacemaker generator change  03/18/2012    PPM gen change by Dr Rayann Heman  . Total knee arthroplasty Left 07/19/2015    Procedure: LEFT TOTAL KNEE ARTHROPLASTY;  Surgeon: Latanya Maudlin, MD;  Location: [facility];  Service: Orthopedics;  Laterality: Left;    There were no vitals filed for this visit.  Visit Diagnosis:  Abnormality of gait  Knee stiffness, left  Right low back pain, with sciatica presence unspecified  Weakness of both legs  Gait instability      Subjective Assessment - 01/22/16 1611    Subjective Pt has been walking with her daughter in the community as abel.     Currently in Pain? No/denies                         [facility] Adult PT Treatment/Exercise - 01/22/16 0001    Posture/Postural Control   Posture/Postural Control Postural limitations   Postural Limitations Rounded Shoulders;Forward head;Flexed trunk   Lumbar Exercises: Standing   Other Standing Lumbar Exercises Standing upright at walker 10x    Knee/Hip Exercises: Stretches  Quad Stretch Left  x 10 in left knee   Knee/Hip Exercises: Aerobic   Nustep L3 x 10 min, seat 8, arms 9  0.35 mi.   Knee/Hip Exercises: Standing   Knee Flexion Strengthening;Both;3 sets;10 reps   Knee Flexion Limitations 3#   Hip Abduction AROM;Stengthening;Both;3 sets;10 reps;Knee straight  3# added   Knee/Hip Exercises: Seated   Long Arc Quad Strengthening;Both;3 sets;10 reps   Long Arc Quad Weight 2 lbs.   Ball Squeeze 2x10   Marching AROM;Strengthening;Both;10 reps;3 sets  3# added   Shoulder Exercises: Seated   Other Seated Exercises shoulder 3 way raises 1# 2x10   Moist Heat Therapy   Moist Heat Location Lumbar Spine  during exercise in sitting on back                   PT Short Term Goals - 01/20/16 1635    PT SHORT TERM GOAL #1   Title be independent in initial HEP   Time 4   Period Weeks    Status Achieved   PT SHORT TERM GOAL #2   Title left knee PROM >/= 100 degrees  95 degrees in sitting   Time 4   Period Weeks   Status Partially Met   PT SHORT TERM GOAL #3   Title left knee strength 4-/5 so she can walk safely with walker   Time 4   Period Weeks   Status On-going           PT Long Term Goals - 01/07/16 1127    PT LONG TERM GOAL #1   Title be independent in advanced HEP   Time 8   Period Weeks   Status On-going   PT LONG TERM GOAL #2   Title FOTO score is </= 61% limitation for knee   Time 8   Period Weeks   Status On-going   PT LONG TERM GOAL #3   Title Left knee flexion AROM >/= 105 degree to make it easier to walk   Time 8   Period Weeks   Status On-going   PT LONG TERM GOAL #4   Title left knee strength 4+/5 so patient is able to return home to live   Time 8   Period Weeks   Status On-going   PT LONG TERM GOAL #5   Title pain decreased >/= 50%    Time 8   Period Weeks   Status Achieved   PT LONG TERM GOAL #6   Title back pain with daily activities decresae >/= 50%    Time 8   Period Weeks   Status On-going   PT LONG TERM GOAL #7   Title TUG </= 50 seconds due to increased strength and endurance with rolling walker    Time 8   Period Weeks   Status On-going   PT LONG TERM GOAL #8   Title getting up and down from chair with 1 try due to increased strength and decreased pain with using her hands.    Time 8   Period Weeks   Status On-going               Plan - 01/22/16 1614    Clinical Impression Statement Pt denies any pain this week.  Pt tolerates all exercise in the clinic well. Pt with increased ease of mobility in the clinic with transitions. Pt will attend 3 more sessions for continued strength and endurance progression.     Pt will benefit from  skilled therapeutic intervention in order to improve on the following deficits Pain;Decreased strength;Decreased mobility;Decreased balance;Decreased activity tolerance;Decreased  endurance;Difficulty walking;Decreased range of motion;Abnormal gait   Rehab Potential Good   Clinical Impairments Affecting Rehab Potential LBP is associated with spinal stenosis and limits standing tolerance.    PT Frequency 2x / week   PT Duration 8 weeks   PT Treatment/Interventions Moist Heat;Therapeutic activities;Patient/family education;Therapeutic exercise;Manual techniques;Balance training;Functional mobility training;Cryotherapy;Neuromuscular re-education;Gait training;Stair training;Passive range of motion   PT Next Visit Plan LE strength, ROM flexion left knee, endurance, gait   Consulted and Agree with Plan of Care Patient        Problem List Patient Active Problem List   Diagnosis Date Noted  . History of total knee arthroplasty 07/19/2015  . At risk for sleep apnea 07/17/2015  . Memory loss 04/05/2014  . Diabetes mellitus type 2 with complications, uncontrolled ([place]) 01/24/2014  . Chronic renal insufficiency, stage II (mild) 03/21/2013  . Encounter for long-term (current) use of other medications 07/29/2012  . Diabetes ([place]) 04/28/2012  . CVA (cerebral vascular accident) ([place])   . EDEMA- LOCALIZED 01/06/2011  . Chronic systolic heart failure ([place]) 12/26/2010  . GERD 04/30/2010  . TRANSIENT ISCHEMIC ATTACKS, HX OF 04/30/2010  . DYSPEPSIA 04/14/2010  . Generalized weakness 03/19/2010  . CONSTIPATION 02/13/2010  . ANEMIA, MILD 01/14/2010  . Hyperlipidemia 09/16/2008  . HYPERTENSION, BENIGN 09/16/2008  . CAD, AUTOLOGOUS BYPASS GRAFT 09/16/2008  . SICK SINUS/ TACHY-BRADY SYNDROME 09/16/2008  . PACEMAKER, PERMANENT 09/16/2008  . Coronary atherosclerosis 10/25/2007  . Hypothyroidism 06/14/2007  . COLONIC POLYPS, HX OF 03/23/2007    Rhen Dossantos, PT 01/22/2016, 4:49 PM  [facility] [address] [place], [place], [address] Phone: [phone]   Fax:  [phone]  Name: AJNA MOORS MRN:  [id number] Date of Birth: 01-02-1925

## 2016-01-27 ENCOUNTER — Encounter: Payer: Self-pay | Admitting: Physical Therapy

## 2016-01-27 ENCOUNTER — Ambulatory Visit: Payer: Medicare Other | Admitting: Physical Therapy

## 2016-01-27 DIAGNOSIS — M25662 Stiffness of left knee, not elsewhere classified: Secondary | ICD-10-CM

## 2016-01-27 DIAGNOSIS — Z96652 Presence of left artificial knee joint: Secondary | ICD-10-CM | POA: Diagnosis not present

## 2016-01-27 DIAGNOSIS — Z471 Aftercare following joint replacement surgery: Secondary | ICD-10-CM | POA: Diagnosis not present

## 2016-01-27 DIAGNOSIS — M1712 Unilateral primary osteoarthritis, left knee: Secondary | ICD-10-CM | POA: Diagnosis not present

## 2016-01-27 DIAGNOSIS — M545 Low back pain: Secondary | ICD-10-CM

## 2016-01-27 DIAGNOSIS — R269 Unspecified abnormalities of gait and mobility: Secondary | ICD-10-CM

## 2016-01-27 DIAGNOSIS — R29898 Other symptoms and signs involving the musculoskeletal system: Secondary | ICD-10-CM

## 2016-01-27 DIAGNOSIS — R2681 Unsteadiness on feet: Secondary | ICD-10-CM

## 2016-01-27 NOTE — Therapy (Addendum)
[facility] [address] [place], [place], [address] Phone: [phone]   Fax:  [phone]  Physical Therapy Treatment  Patient Details  Name: [name] MRN: [id number] Date of Birth: 10-21-1925 Referring Provider: Dr. Latanya Maudlin  Encounter Date: 01/27/2016      PT End of Session - 01/27/16 1620    Visit Number 25  14 in 2017   Number of Visits 29   Date for PT Re-Evaluation 01/29/16   Authorization Type 14 visit used in 2017, KX at visit 15 in 2017   PT Start Time 1612   PT Stop Time 1656   PT Time Calculation (min) 44 min   Activity Tolerance Patient tolerated treatment well   Behavior During Therapy [facility] for tasks assessed/performed      Past Medical History  Diagnosis Date  . Sliding hiatal hernia   . Erosive esophagitis   . Peptic stricture of esophagus   . Diverticulosis   . Arthritis   . Hypothyroidism   . Diabetes mellitus     Type I  . Sick sinus syndrome [facility])     s/p [employer]tr[employer] DDD pacemaker implanted; generator change 02/2012  . Hypertension   . Hyperlipidemia   . GERD (gastroesophageal reflux disease)   . Adenomatous colon polyp 07/2002  . Obesity   . Paroxysmal atrial fibrillation ([place]) 02/04/15    discovered on PPM remote interrogation 6/16  . Coronary artery disease     s/p CABG 2001 x 5  . CVA (cerebral vascular accident) [facility]) 2013 or 2014    Dr Evelena Leyden, Neurology  . Dementia   . History of swelling of feet     props up at night, circulations has been checked inlegs in past and was told it was ok    Past Surgical History  Procedure Laterality Date  . Pacemaker placement  03/25/2000    by Dr Olevia Perches  . Tonsillectomy and adenoidectomy    . Appendectomy    . Cholecystectomy    . Total abdominal hysterectomy w/ bilateral salpingoophorectomy      Endometiosis  . Replacement total knee  2004    Right  . Colonoscopy w/ polypectomy  2003  . Cataract extraction  05/2005  .  Coronary artery bypass graft  02/2000    5 vessel  . Rotator cuff repair Right 2009  . Permanent pacemaker generator change  03/18/2012    PPM gen change by Dr Rayann Heman  . Total knee arthroplasty Left 07/19/2015    Procedure: LEFT TOTAL KNEE ARTHROPLASTY;  Surgeon: Latanya Maudlin, MD;  Location: [facility];  Service: Orthopedics;  Laterality: Left;    There were no vitals filed for this visit.  Visit Diagnosis:  Abnormality of gait  Knee stiffness, left  Right low back pain, with sciatica presence unspecified  Weakness of both legs  Gait instability  Weakness of left lower extremity      Subjective Assessment - 01/27/16 1612    Subjective Pt has a good day, she reports walking a lot at home with her walker   Patient is accompained by: Family member  daughter   Pertinent History Rt TKA, CVA   Limitations Standing   How long can you stand comfortably? difficult   How long can you walk comfortably? needs a rolling walker   Patient Stated Goals increase left knee flexion; reduce pain   Currently in Pain? No/denies            [facility] PT Assessment -  01/27/16 0001    Assessment   Medical Diagnosis Z96.652 S/P total left knee replacement; Spinal stenosis of lumbar   Onset Date/Surgical Date 07/18/15   Precautions   Precautions ICD/Pacemaker   Precaution Comments Pace maker precautions   Prior Function   Level of Independence Independent with household mobility with device   Cognition   Overall Cognitive Status History of cognitive impairments - at baseline   Observation/Other Assessments   Focus on Therapeutic Outcomes (FOTO)  55% limitations -left knee- eval 85%   Ambulation/Gait   Ambulation Distance (Feet) 170 Feet   Gait Pattern Step-through pattern   Gait Comments Pt with improved uprigth posture and standing closer into RW   Timed Up and Go Test   TUG Normal TUG   Normal TUG (seconds) 47  1st trail 54sec, 2nd trail 50 sec, 3rd trail 47 sec                      [facility] Adult PT Treatment/Exercise - 01/27/16 0001    Ambulation/Gait   Ambulation/Gait Yes   Ambulation/Gait Assistance 6: Modified independent (Device/Increase time)   Assistive device Rolling walker   Posture/Postural Control   Posture/Postural Control Postural limitations   Postural Limitations Rounded Shoulders;Forward head;Flexed trunk   Exercises   Exercises Knee/Hip   Lumbar Exercises: Standing   Other Standing Lumbar Exercises Standing upright at walker 10x    Knee/Hip Exercises: Stretches   Quad Stretch Left  x 10 in left knee   Knee/Hip Exercises: Aerobic   Nustep L3 x 10 min, seat 8, arms 9  0.24mh   Knee/Hip Exercises: Standing   Knee Flexion Strengthening;Both;3 sets;10 reps   Knee Flexion Limitations 3#   Hip Abduction AROM;Stengthening;Both;3 sets;10 reps;Knee straight  3# added   Knee/Hip Exercises: Seated   Long Arc Quad Strengthening;Both;3 sets;10 reps   Long Arc Quad Weight 3 lbs.  for last set only 2# on left   Ball Squeeze 2x10   Marching AROM;Strengthening;Both;10 reps;3 sets  3# added   Shoulder Exercises: Seated   Other Seated Exercises --   Moist Heat Therapy   Moist Heat Location Lumbar Spine  during sitting exercises in sitting on back                  PT Short Term Goals - 01/20/16 1635    PT SHORT TERM GOAL #1   Title be independent in initial HEP   Time 4   Period Weeks   Status Achieved   PT SHORT TERM GOAL #2   Title left knee PROM >/= 100 degrees  95 degrees in sitting   Time 4   Period Weeks   Status Partially Met   PT SHORT TERM GOAL #3   Title left knee strength 4-/5 so she can walk safely with walker   Time 4   Period Weeks   Status On-going           PT Long Term Goals - 01/07/16 1127    PT LONG TERM GOAL #1   Title be independent in advanced HEP   Time 8   Period Weeks   Status On-going   PT LONG TERM GOAL #2   Title FOTO score is </= 61% limitation for knee   Time  8   Period Weeks   Status On-going   PT LONG TERM GOAL #3   Title Left knee flexion AROM >/= 105 degree to make it easier to walk   Time 8  Period Weeks   Status On-going   PT LONG TERM GOAL #4   Title left knee strength 4+/5 so patient is able to return home to live   Time 8   Period Weeks   Status On-going   PT LONG TERM GOAL #5   Title pain decreased >/= 50%    Time 8   Period Weeks   Status Achieved   PT LONG TERM GOAL #6   Title back pain with daily activities decresae >/= 50%    Time 8   Period Weeks   Status On-going   PT LONG TERM GOAL #7   Title TUG </= 50 seconds due to increased strength and endurance with rolling walker    Time 8   Period Weeks   Status On-going   PT LONG TERM GOAL #8   Title getting up and down from chair with 1 try due to increased strength and decreased pain with using her hands.    Time 8   Period Weeks   Status On-going               Plan - February 03, 2016 1635    Clinical Impression Statement Pt has no complain's of pain. She tolerated walking with the RW for 134fet in the gym needed 492m and 40sec, pt with improved gait pattern step length more equal, and less fearful with ambulation. Pt will be D/C next visit    Pt will benefit from skilled therapeutic intervention in order to improve on the following deficits Pain;Decreased strength;Decreased mobility;Decreased balance;Decreased activity tolerance;Decreased endurance;Difficulty walking;Decreased range of motion;Abnormal gait   Rehab Potential Good   Clinical Impairments Affecting Rehab Potential LBP is associated with spinal stenosis and limits standing tolerance.    PT Frequency 2x / week   PT Duration 8 weeks   PT Treatment/Interventions Moist Heat;Therapeutic activities;Patient/family education;Therapeutic exercise;Manual techniques;Balance training;Functional mobility training;Cryotherapy;Neuromuscular re-education;Gait training;Stair training;Passive range of motion   PT Next  Visit Plan LE strength, ROM flexion left knee, endurance, gait   PT Home Exercise Plan current HEP   Consulted and Agree with Plan of Care Patient        Problem List Patient Active Problem List   Diagnosis Date Noted  . History of total knee arthroplasty 07/19/2015  . At risk for sleep apnea 07/17/2015  . Memory loss 04/05/2014  . Diabetes mellitus type 2 with complications, uncontrolled ([facility]/02/2014  . Chronic renal insufficiency, stage II (mild) 03/21/2013  . Encounter for long-term (current) use of other medications 07/29/2012  . Diabetes ([facility]06/04/2012  . CVA (cerebral vascular accident) ([facility]  . EDEMA- LOCALIZED 01/06/2011  . Chronic systolic heart failure ([facility]02/01/2011  . GERD 04/30/2010  . TRANSIENT ISCHEMIC ATTACKS, HX OF 04/30/2010  . DYSPEPSIA 04/14/2010  . Generalized weakness 03/19/2010  . CONSTIPATION 02/13/2010  . ANEMIA, MILD 01/14/2010  . Hyperlipidemia 09/16/2008  . HYPERTENSION, BENIGN 09/16/2008  . CAD, AUTOLOGOUS BYPASS GRAFT 09/16/2008  . SICK SINUS/ TACHY-BRADY SYNDROME 09/16/2008  . PACEMAKER, PERMANENT 09/16/2008  . Coronary atherosclerosis 10/25/2007  . Hypothyroidism 06/14/2007  . COLONIC POLYPS, HX OF 03/23/2007    NAUMANN-HOUEGNIFIO,Rawleigh Rode PTA 3/03-13-20175:07 PM   G-codes: Mobility Category CK goal status CK D/C status PHYSICAL THERAPY DISCHARGE SUMMARY  Visits from Start of Care: 25  Current functional level related to goals / functional outcomes: Pt attended 25 PT sessions with emphasis on strength and mobility progression.  Pt has HEP in place and will continue with strength progression after discharge.  Remaining deficits: Limited functional strength and mobility of a chronic nature.  See above for current status.     Education / Equipment: HEP Plan: Patient agrees to discharge.  Patient goals were not met. Patient is being discharged due to being pleased with the current functional level.  ?????    Sigurd Sos,  PT 01/28/2016 11:46 AM   [facility] [address] [place], [place], [address] Phone: [phone]   Fax:  [phone]  Name: [name] MRN: [id number] Date of Birth: 02-21-1925

## 2016-01-28 ENCOUNTER — Other Ambulatory Visit: Payer: Self-pay | Admitting: Endocrinology

## 2016-01-29 ENCOUNTER — Ambulatory Visit: Payer: Medicare Other

## 2016-02-02 ENCOUNTER — Other Ambulatory Visit: Payer: Self-pay | Admitting: Internal Medicine

## 2016-02-02 ENCOUNTER — Other Ambulatory Visit: Payer: Self-pay | Admitting: Endocrinology

## 2016-02-05 ENCOUNTER — Other Ambulatory Visit: Payer: Self-pay | Admitting: Internal Medicine

## 2016-02-05 NOTE — Telephone Encounter (Signed)
Patient had OV with you feb/2017---but i dont see where overactive bladder was addressed---are you ok with refilling this med---please advise, thanks

## 2016-02-05 NOTE — Telephone Encounter (Signed)
Ok to refill 

## 2016-02-10 ENCOUNTER — Telehealth: Payer: Self-pay

## 2016-02-10 NOTE — Telephone Encounter (Signed)
Handicap Placard signed and placed in cabinet for pt pick up.  POA [name] advised via personal VM

## 2016-02-23 NOTE — Patient Instructions (Signed)
Please reduce the acarbose to just twice a day (breakfast and supper). check your blood sugar once a day.  vary the time of day when you check, between before the 3 meals, and at bedtime.  also check if you have symptoms of your blood sugar being too high or too low.  please keep a record of the readings and bring it to your next appointment here.  please call us sooner if your blood sugar goes below 70, or if you have a lot of readings over 200.   Please come back for a follow-up appointment in 4 months.

## 2016-02-23 NOTE — Progress Notes (Signed)
   Subjective:    Patient ID: [name], female    DOB: [birth year], 83 y.o.   MRN: [id number]  HPI Pt returns for f/u of diabetes mellitus: DM type: 2 Dx'ed: [id number] Complications: sensory neuropathy, CVA, renal insuff, and CAD.  Therapy: 6 oral meds.   GDM: never. DKA: never. Severe hypoglycemia: never.   Pancreatitis: never.   Other: she is not on metformin, due to CHF, she is not on actos, due to edema; she is off amaryl, due to renal insuff.  Pt says she'll take insulin if necessary.   Interval history:    i asked dtr, who says cbg's are well-controlled.  she brings a record of pt's cbg's which i have reviewed today.  Most are in the low to mid-100's   Review of Systems     Objective:   Physical Exam        Assessment & Plan:   This encounter was created in error - please disregard.

## 2016-02-25 ENCOUNTER — Encounter: Payer: Medicare Other | Admitting: Endocrinology

## 2016-03-03 ENCOUNTER — Ambulatory Visit (INDEPENDENT_AMBULATORY_CARE_PROVIDER_SITE_OTHER): Payer: Medicare Other | Admitting: *Deleted

## 2016-03-03 DIAGNOSIS — I495 Sick sinus syndrome: Secondary | ICD-10-CM | POA: Diagnosis not present

## 2016-03-03 NOTE — Progress Notes (Signed)
Remote pacemaker transmission.   

## 2016-03-16 ENCOUNTER — Ambulatory Visit (INDEPENDENT_AMBULATORY_CARE_PROVIDER_SITE_OTHER): Payer: Medicare Other | Admitting: *Deleted

## 2016-03-16 DIAGNOSIS — I4891 Unspecified atrial fibrillation: Secondary | ICD-10-CM

## 2016-03-16 LAB — CBC
HEMATOCRIT: 37 % (ref 35.0–45.0)
HEMOGLOBIN: 12.2 g/dL (ref 11.7–15.5)
MCH: 29 pg (ref 27.0–33.0)
MCHC: 33 g/dL (ref 32.0–36.0)
MCV: 87.9 fL (ref 80.0–100.0)
MPV: 11.2 fL (ref 7.5–12.5)
Platelets: 173 10*3/uL (ref 140–400)
RBC: 4.21 MIL/uL (ref 3.80–5.10)
RDW: 14.2 % (ref 11.0–15.0)
WBC: 6.8 10*3/uL (ref 3.8–10.8)

## 2016-03-16 LAB — BASIC METABOLIC PANEL
BUN: 21 mg/dL (ref 7–25)
CO2: 25 mmol/L (ref 20–31)
Calcium: 9 mg/dL (ref 8.6–10.4)
Chloride: 105 mmol/L (ref 98–110)
Creat: 1.49 mg/dL — ABNORMAL HIGH (ref 0.60–0.88)
GLUCOSE: 129 mg/dL — AB (ref 65–99)
POTASSIUM: 4.5 mmol/L (ref 3.5–5.3)
SODIUM: 141 mmol/L (ref 135–146)

## 2016-03-16 NOTE — Patient Instructions (Addendum)
Pt was started on Eliquis 2.5mg  q 12 hours  for Atrial Fib on 05/19/2015.    Reviewed patients medication list.  Pt is not currently on any combined P-gp and strong CYP3A4 inhibitors/inducers (ketoconazole, traconazole, ritonavir, carbamazepine, phenytoin, rifampin, St. John's wort).  Reviewed labs.  SCr 1.49 , Weight 93.09kg.  Dose is appropriate based on weight ,age and 61.   Hgb 12.2 and HCT 37  A full discussion of the nature of anticoagulants has been carried out.  A benefit/risk analysis has been presented to the patient, so that they understand the justification for choosing anticoagulation with Eliquis at this time.  The need for compliance is stressed.  Pt is aware to take the medication twice daily.  Side effects of potential bleeding are discussed, including unusual colored urine or stools, coughing up blood or coffee ground emesis, nose bleeds or serious fall or head trauma.  Discussed signs and symptoms of stroke. The patient should avoid any OTC items containing aspirin or ibuprofen.  Avoid alcohol consumption.   Call if any signs of abnormal bleeding.  Discussed financial obligations and states is not having  any difficulty in obtaining medication.  Next lab test test in 3 months Daughter states she has had 2 episodes of TIA symptoms lasting only short period of time but weakness noted and confusion . Has missed one dose of Eliquis Denies any bleeding .Will do CBC and Bmet today  No falls  03/17/2016 Spoke with [name] pt's daughter and instructed to have pt continue Eliquis 2.5mg  q 12 hours and to monitor her closely for signs and symptoms of TIA /Stroke and to call Dr if occurs and pt's daughter states will do so and appt made to recheck labs in 3 months

## 2016-03-18 ENCOUNTER — Ambulatory Visit: Payer: Medicare Other | Admitting: Endocrinology

## 2016-03-24 LAB — CUP PACEART REMOTE DEVICE CHECK
Battery Impedance: 231 Ohm
Battery Remaining Longevity: 119 mo
Battery Voltage: 2.78 V
Implantable Lead Implant Date: 20010503
Implantable Lead Implant Date: 20010503
Implantable Lead Model: 5076
Implantable Lead Model: 5076
Lead Channel Impedance Value: 399 Ohm
Lead Channel Impedance Value: 820 Ohm
Lead Channel Pacing Threshold Pulse Width: 0.4 ms
Lead Channel Setting Pacing Amplitude: 2 V
Lead Channel Setting Pacing Amplitude: 2.75 V
Lead Channel Setting Sensing Sensitivity: 5.6 mV
MDC IDC LEAD LOCATION: 753859
MDC IDC LEAD LOCATION: 753860
MDC IDC MSMT LEADCHNL RA PACING THRESHOLD AMPLITUDE: 0.625 V
MDC IDC MSMT LEADCHNL RA PACING THRESHOLD PULSEWIDTH: 0.4 ms
MDC IDC MSMT LEADCHNL RV PACING THRESHOLD AMPLITUDE: 1.375 V
MDC IDC MSMT LEADCHNL RV SENSING INTR AMPL: 11.2 mV
MDC IDC SESS DTM: 20170411123058
MDC IDC SET LEADCHNL RV PACING PULSEWIDTH: 0.4 ms
MDC IDC STAT BRADY AP VP PERCENT: 2 %
MDC IDC STAT BRADY AP VS PERCENT: 97 %
MDC IDC STAT BRADY AS VP PERCENT: 0 %
MDC IDC STAT BRADY AS VS PERCENT: 1 %

## 2016-03-28 ENCOUNTER — Emergency Department (HOSPITAL_COMMUNITY): Payer: Medicare Other

## 2016-03-28 ENCOUNTER — Emergency Department (HOSPITAL_COMMUNITY)
Admission: EM | Admit: 2016-03-28 | Discharge: 2016-03-28 | Disposition: A | Discharge status: Home / Self Care | Payer: Medicare Other | Attending: Emergency Medicine | Admitting: Emergency Medicine

## 2016-03-28 DIAGNOSIS — M1990 Unspecified osteoarthritis, unspecified site: Secondary | ICD-10-CM | POA: Diagnosis not present

## 2016-03-28 DIAGNOSIS — Z86010 Personal history of colonic polyps: Secondary | ICD-10-CM | POA: Insufficient documentation

## 2016-03-28 DIAGNOSIS — R531 Weakness: Secondary | ICD-10-CM | POA: Diagnosis not present

## 2016-03-28 DIAGNOSIS — F0390 Unspecified dementia without behavioral disturbance: Secondary | ICD-10-CM | POA: Insufficient documentation

## 2016-03-28 DIAGNOSIS — Z87891 Personal history of nicotine dependence: Secondary | ICD-10-CM | POA: Diagnosis not present

## 2016-03-28 DIAGNOSIS — E785 Hyperlipidemia, unspecified: Secondary | ICD-10-CM | POA: Insufficient documentation

## 2016-03-28 DIAGNOSIS — Z8673 Personal history of transient ischemic attack (TIA), and cerebral infarction without residual deficits: Secondary | ICD-10-CM | POA: Insufficient documentation

## 2016-03-28 DIAGNOSIS — I2510 Atherosclerotic heart disease of native coronary artery without angina pectoris: Secondary | ICD-10-CM | POA: Diagnosis not present

## 2016-03-28 DIAGNOSIS — I10 Essential (primary) hypertension: Secondary | ICD-10-CM | POA: Insufficient documentation

## 2016-03-28 DIAGNOSIS — N390 Urinary tract infection, site not specified: Secondary | ICD-10-CM | POA: Insufficient documentation

## 2016-03-28 DIAGNOSIS — E039 Hypothyroidism, unspecified: Secondary | ICD-10-CM | POA: Insufficient documentation

## 2016-03-28 DIAGNOSIS — K219 Gastro-esophageal reflux disease without esophagitis: Secondary | ICD-10-CM | POA: Insufficient documentation

## 2016-03-28 DIAGNOSIS — R404 Transient alteration of awareness: Secondary | ICD-10-CM | POA: Diagnosis not present

## 2016-03-28 DIAGNOSIS — E669 Obesity, unspecified: Secondary | ICD-10-CM | POA: Diagnosis not present

## 2016-03-28 DIAGNOSIS — Z79899 Other long term (current) drug therapy: Secondary | ICD-10-CM | POA: Diagnosis not present

## 2016-03-28 LAB — URINALYSIS, ROUTINE W REFLEX MICROSCOPIC
BILIRUBIN URINE: NEGATIVE
KETONES UR: NEGATIVE mg/dL
Nitrite: NEGATIVE
PROTEIN: NEGATIVE mg/dL
Specific Gravity, Urine: 1.018 (ref 1.005–1.030)
pH: 5.5 (ref 5.0–8.0)

## 2016-03-28 LAB — CBC WITH DIFFERENTIAL/PLATELET
BASOS ABS: 0 10*3/uL (ref 0.0–0.1)
Basophils Relative: 0 %
EOS PCT: 1 %
Eosinophils Absolute: 0.1 10*3/uL (ref 0.0–0.7)
HCT: 35.8 % — ABNORMAL LOW (ref 36.0–46.0)
Hemoglobin: 11.2 g/dL — ABNORMAL LOW (ref 12.0–15.0)
LYMPHS PCT: 25 %
Lymphs Abs: 1.6 10*3/uL (ref 0.7–4.0)
MCH: 28 pg (ref 26.0–34.0)
MCHC: 31.3 g/dL (ref 30.0–36.0)
MCV: 89.5 fL (ref 78.0–100.0)
MONO ABS: 0.9 10*3/uL (ref 0.1–1.0)
MONOS PCT: 14 %
Neutro Abs: 3.7 10*3/uL (ref 1.7–7.7)
Neutrophils Relative %: 60 %
PLATELETS: 136 10*3/uL — AB (ref 150–400)
RBC: 4 MIL/uL (ref 3.87–5.11)
RDW: 13.9 % (ref 11.5–15.5)
WBC: 6.2 10*3/uL (ref 4.0–10.5)

## 2016-03-28 LAB — COMPREHENSIVE METABOLIC PANEL
ALBUMIN: 3.5 g/dL (ref 3.5–5.0)
ALK PHOS: 34 U/L — AB (ref 38–126)
ALT: 26 U/L (ref 14–54)
AST: 40 U/L (ref 15–41)
Anion gap: 10 (ref 5–15)
BILIRUBIN TOTAL: 0.5 mg/dL (ref 0.3–1.2)
BUN: 20 mg/dL (ref 6–20)
CALCIUM: 9.6 mg/dL (ref 8.9–10.3)
CO2: 24 mmol/L (ref 22–32)
Chloride: 106 mmol/L (ref 101–111)
Creatinine, Ser: 1.6 mg/dL — ABNORMAL HIGH (ref 0.44–1.00)
GFR calc Af Amer: 32 mL/min — ABNORMAL LOW (ref >60)
GFR calc non Af Amer: 28 mL/min — ABNORMAL LOW (ref >60)
GLUCOSE: 163 mg/dL — AB (ref 65–99)
POTASSIUM: 3.8 mmol/L (ref 3.5–5.1)
Sodium: 140 mmol/L (ref 135–145)
TOTAL PROTEIN: 6.2 g/dL — AB (ref 6.5–8.1)

## 2016-03-28 LAB — I-STAT TROPONIN, ED: Troponin i, poc: 0.01 ng/mL (ref 0.00–0.08)

## 2016-03-28 LAB — URINE MICROSCOPIC-ADD ON

## 2016-03-28 MED ORDER — DEXTROSE 5 % IV SOLN
1.0000 g | Freq: Once | INTRAVENOUS | Status: AC
  Administered 2016-03-28: 1 g via INTRAVENOUS
  Filled 2016-03-28: qty 10

## 2016-03-28 MED ORDER — SODIUM CHLORIDE 0.9 % IV BOLUS (SEPSIS)
500.0000 mL | Freq: Once | INTRAVENOUS | Status: AC
  Administered 2016-03-28: 500 mL via INTRAVENOUS

## 2016-03-28 MED ORDER — CEPHALEXIN 500 MG PO CAPS: 500.0000 mg | ORAL_CAPSULE | Freq: Four times a day (QID) | ORAL | Status: DC

## 2016-03-28 NOTE — ED Notes (Signed)
Patient to [facility] ED with New onset right sided weakness.  She has a history of a previous stroke but with no reported residual.  Patient does have a pacemaker and is on eloquis.

## 2016-03-28 NOTE — ED Provider Notes (Signed)
CSN: [id number]     Arrival date & time 03/28/16  1143 History   First MD Initiated Contact with Patient 03/28/16 1144     Chief Complaint  Patient presents with  . Stroke Symptoms      HPI  Expand All Collapse All   Patient to [facility] ED with New onset right sided weakness. She has a history of a previous stroke but with no reported residual. Patient does have a pacemaker and is on eloquis        Past Medical History  Diagnosis Date  . Sliding hiatal hernia   . Erosive esophagitis   . Peptic stricture of esophagus   . Diverticulosis   . Arthritis   . Hypothyroidism   . Diabetes mellitus     Type I  . Sick sinus syndrome [facility])     s/p Medtronic DDD pacemaker implanted; generator change 02/2012  . Hypertension   . Hyperlipidemia   . GERD (gastroesophageal reflux disease)   . Adenomatous colon polyp 07/2002  . Obesity   . Paroxysmal atrial fibrillation ([place]) 02/04/15    discovered on PPM remote interrogation 6/16  . Coronary artery disease     s/p CABG 2001 x 5  . CVA (cerebral vascular accident) [facility]) 2013 or 2014    Dr Evelena Leyden, Neurology  . Dementia   . History of swelling of feet     props up at night, circulations has been checked inlegs in past and was told it was ok   Past Surgical History  Procedure Laterality Date  . Pacemaker placement  03/25/2000    by Dr Olevia Perches  . Tonsillectomy and adenoidectomy    . Appendectomy    . Cholecystectomy    . Total abdominal hysterectomy w/ bilateral salpingoophorectomy      Endometiosis  . Replacement total knee  2004    Right  . Colonoscopy w/ polypectomy  2003  . Cataract extraction  05/2005  . Coronary artery bypass graft  02/2000    5 vessel  . Rotator cuff repair Right 2009  . Permanent pacemaker generator change  03/18/2012    PPM gen change by Dr Rayann Heman  . Total knee arthroplasty Left 07/19/2015    Procedure: LEFT TOTAL KNEE ARTHROPLASTY;  Surgeon: Latanya Maudlin, MD;  Location: [facility] ORS;  Service: Orthopedics;  Laterality:  Left;   Family History  Problem Relation Age of Onset  . Colon cancer Mother 34  . Breast cancer Mother   . Transient ischemic attack Mother   . Cancer Mother   . Hypertension Mother   . Stroke Mother   . Diabetes Mother   . Colon cancer Brother 44  . Cancer Brother   . Hypertension Brother   . Heart attack Brother   . Breast cancer Sister   . Cancer Sister   . Diabetes Sister   . Prostate cancer Brother   . Heart attack Brother   . Heart disease      3 brothers had MI; 1 pre 45  . Hypertension Sister   . Heart attack Daughter   . Diabetes Maternal Aunt    Social History  Substance Use Topics  . Smoking status: Former Research scientist (life sciences)  . Smokeless tobacco: Former Systems developer    Quit date: 11/23/1981     Comment: quit 28+ yrs ago (as of 2012)  . Alcohol Use: No   OB History    No data available     Review of Systems  All other systems reviewed and are  negative.     Allergies  Codeine and Robaxin  Home Medications   Prior to Admission medications   Medication Sig Start Date End Date Taking? Authorizing Provider  acarbose (PRECOSE) 50 MG tablet Take 1 tablet (50 mg total) by mouth 2 (two) times daily with a meal. 10/29/15  Yes Renato Shin, MD  acetaminophen (TYLENOL) 500 MG tablet Take 500 mg by mouth every 8 (eight) hours as needed for mild pain. Take 2 tab = 1,000 mg PO Q 8 hours PRN   Yes Historical Provider, MD  amitriptyline (ELAVIL) 25 MG tablet Take 25 mg by mouth at bedtime.   Yes Historical Provider, MD  apixaban (ELIQUIS) 2.5 MG TABS tablet Take 1 tablet (2.5 mg total) by mouth 2 (two) times daily. 05/20/15  Yes Amber Sena Slate, NP  atorvastatin (LIPITOR) 20 MG tablet TAKE 1 TABLET BY MOUTH EVERY DAY (TAKE IN PLACE OF PRAVASTATIN) 02/03/16  Yes Binnie Rail, MD  bromocriptine (PARLODEL) 2.5 MG tablet TAKE 1 TABLET (2.5 MG TOTAL) BY MOUTH AT BEDTIME. 06/12/15  Yes Renato Shin, MD  Calcium-Vitamin D 600-200 MG-UNIT per tablet Take 1 tablet by mouth daily.   Yes Historical  Provider, MD  colesevelam (WELCHOL) 625 MG tablet Take 3 tablets (1,875 mg total) by mouth 2 (two) times daily before a meal. 07/25/15  Yes Mahima Pandey, MD  diltiazem (CARDIZEM) 30 MG tablet Take 1 tablet (30 mg total) by mouth every 6 (six) hours as needed. 05/20/15  Yes Amber Sena Slate, NP  docusate sodium (COLACE) 100 MG capsule Take 100 mg by mouth 2 (two) times daily.   Yes Historical Provider, MD  donepezil (ARICEPT) 10 MG tablet Take 1 tablet (10 mg total) by mouth at bedtime. 04/08/15  Yes Dennie Bible, NP  enalapril (VASOTEC) 20 MG tablet TAKE 1 TABLET BY MOUTH DAILY 08/22/15  Yes Thompson Grayer, MD  fish oil-omega-3 fatty acids 1000 MG capsule Take 1 g by mouth daily.   Yes Historical Provider, MD  INVOKANA 100 MG TABS tablet TAKE 1 TABLET BY MOUTH EVERY DAY 08/26/15  Yes Renato Shin, MD  levothyroxine (SYNTHROID, LEVOTHROID) 25 MCG tablet Take 37.5 mcg by mouth daily before breakfast. Take 1 1/2 tablets daily   Yes Historical Provider, MD  meclizine (ANTIVERT) 25 MG tablet Take 1 tablet (25 mg total) by mouth 3 (three) times daily as needed for dizziness. 09/09/15  Yes Blanchie Dessert, MD  memantine (NAMENDA XR) 28 MG CP24 24 hr capsule Take 1 capsule (28 mg total) by mouth daily. 04/08/15  Yes Dennie Bible, NP  metoprolol (TOPROL-XL) 200 MG 24 hr tablet TAKE 1 TABLET (200 MG TOTAL) BY MOUTH DAILY. 09/17/15  Yes Thompson Grayer, MD  MYRBETRIQ 25 MG TB24 tablet TAKE 1 TABLET (25 MG TOTAL) BY MOUTH DAILY. 02/06/16  Yes Binnie Rail, MD  niacin 500 MG tablet Take 500 mg by mouth every evening.    Yes Historical Provider, MD  nitroGLYCERIN (NITROSTAT) 0.3 MG SL tablet Place 1 tablet (0.3 mg total) under the tongue every 5 (five) minutes as needed. For chest pain 07/16/14  Yes Thompson Grayer, MD  pantoprazole (PROTONIX) 40 MG tablet TAKE 1 TABLET BY MOUTH EVERY DAY 01/06/16  Yes Binnie Rail, MD  repaglinide (PRANDIN) 2 MG tablet TAKE 1 TABLET (2 MG TOTAL) BY MOUTH 3 (THREE) TIMES DAILY  BEFORE MEALS. 01/28/16  Yes Renato Shin, MD  sitaGLIPtin (JANUVIA) 100 MG tablet Take 50 mg by mouth daily. Take half tablet daily  07/25/15  Yes Mahima Bubba Camp, MD  traMADol (ULTRAM) 50 MG tablet Take 1-2 tablets (50-100 mg total) by mouth every 6 (six) hours as needed for moderate pain. Patient taking differently: Take 50 mg by mouth at bedtime.  07/20/15  Yes Amber Cecilio Asper, PA-C  cephALEXin (KEFLEX) 500 MG capsule Take 1 capsule (500 mg total) by mouth 4 (four) times daily. 03/28/16   Leonard Schwartz, MD  WELCHOL 625 MG tablet TAKE 3 TABLETS (1,875 MG TOTAL) BY MOUTH 2 (TWO) TIMES DAILY BEFORE A MEAL. Patient not taking: Reported on 03/28/2016 01/17/16   Renato Shin, MD   Temp(Src) 97.9 F (36.6 C) Physical Exam  Constitutional: She is oriented to person, place, and time. She appears well-developed and well-nourished. No distress.  HENT:  Head: Normocephalic and atraumatic.  Eyes: Pupils are equal, round, and reactive to light.  Neck: Normal range of motion.  Cardiovascular: Normal rate and intact distal pulses.   Pulmonary/Chest: No respiratory distress.  Abdominal: Normal appearance. She exhibits no distension.  Musculoskeletal: Normal range of motion.  Neurological: She is alert and oriented to person, place, and time. No cranial nerve deficit. GCS eye subscore is 4. GCS verbal subscore is 5. GCS motor subscore is 6.  Skin: Skin is warm and dry. No rash noted.  Psychiatric: She has a normal mood and affect. Her behavior is normal.  Nursing note and vitals reviewed.   ED Course  Procedures (including critical care time) Medications  sodium chloride 0.9 % bolus 500 mL (not administered)  cefTRIAXone (ROCEPHIN) 1 g in dextrose 5 % 50 mL IVPB (1 g Intravenous New Bag/Given 03/28/16 1511)   Labs Review Labs Reviewed  COMPREHENSIVE METABOLIC PANEL - Abnormal; Notable for the following:    Glucose, Bld 163 (*)    Creatinine, Ser 1.60 (*)    Total Protein 6.2 (*)    Alkaline Phosphatase 34  (*)    GFR calc non Af Amer 28 (*)    GFR calc Af Amer 32 (*)    All other components within normal limits  CBC WITH DIFFERENTIAL/PLATELET - Abnormal; Notable for the following:    Hemoglobin 11.2 (*)    HCT 35.8 (*)    Platelets 136 (*)    All other components within normal limits  URINALYSIS, ROUTINE W REFLEX MICROSCOPIC (NOT AT [facility]) - Abnormal; Notable for the following:    APPearance CLOUDY (*)    Glucose, UA >1000 (*)    Hgb urine dipstick SMALL (*)    Leukocytes, UA MODERATE (*)    All other components within normal limits  URINE MICROSCOPIC-ADD ON - Abnormal; Notable for the following:    Squamous Epithelial / LPF 6-30 (*)    Bacteria, UA MANY (*)    All other components within normal limits  URINE CULTURE  I-STAT TROPOININ, ED    Imaging Review Ct Head Wo Contrast  03/28/2016  CLINICAL DATA:  Patient with right-sided weakness. History of stroke. EXAM: CT HEAD WITHOUT CONTRAST TECHNIQUE: Contiguous axial images were obtained from the base of the skull through the vertex without intravenous contrast. COMPARISON:  Brain CT 09/09/2015 FINDINGS: Ventricles and sulci are appropriate for patient's age. Periventricular and subcortical white matter hypodensity compatible with chronic microvascular ischemic changes. No evidence for acute cortically based infarct, intracranial hemorrhage, mass lesion or mass effect. Orbits are unremarkable. Paranasal sinus mucosal thickening. Small amount of fluid left maxillary sinus. Mastoid air cell opacification. IMPRESSION: No acute intracranial process. Chronic microvascular ischemic changes. Chronic fluid within the mastoid  air cells. Electronically Signed   By: Lovey Newcomer M.D.   On: 03/28/2016 13:19   Dg Chest Portable 1 View  03/28/2016  CLINICAL DATA:  Right-sided weakness EXAM: PORTABLE CHEST 1 VIEW COMPARISON:  September 09, 2015 FINDINGS: Cardiomegaly. Stable pacemaker. The hila and mediastinum are unremarkable given portable technique. No  pneumothorax. No pulmonary nodules, masses, or infiltrates. No overt edema. IMPRESSION: No acute abnormalities. Electronically Signed   By: Dorise Bullion III M.D   On: 03/28/2016 12:15   I have personally reviewed and evaluated these images and lab results as part of my medical decision-making.   EKG Interpretation   Date/Time:  Saturday Mar 28 2016 11:52:11 EDT Ventricular Rate:  67 PR Interval:  240 QRS Duration: 94 QT Interval:  519 QTC Calculation: 548 R Axis:   62 Text Interpretation:  Sinus rhythm Prolonged PR interval Borderline  repolarization abnormality Prolonged QT interval Confirmed by Roney Youtz  MD,  Lawanna Cecere ([id number]) on 03/28/2016 12:16:59 PM     After treatment in the ED the patient feels back to baseline and wants to go home. MDM   Final diagnoses:  UTI (lower urinary tract infection)        Leonard Schwartz, MD 03/29/16 [phone]

## 2016-03-28 NOTE — Discharge Instructions (Signed)

## 2016-03-30 ENCOUNTER — Ambulatory Visit (INDEPENDENT_AMBULATORY_CARE_PROVIDER_SITE_OTHER): Payer: Medicare Other | Admitting: Endocrinology

## 2016-03-30 ENCOUNTER — Telehealth: Payer: Self-pay

## 2016-03-30 ENCOUNTER — Encounter: Payer: Self-pay | Admitting: Endocrinology

## 2016-03-30 VITALS — BP 110/80 | HR 75 | Ht 65.5 in | Wt 203.0 lb

## 2016-03-30 DIAGNOSIS — E1122 Type 2 diabetes mellitus with diabetic chronic kidney disease: Secondary | ICD-10-CM

## 2016-03-30 DIAGNOSIS — N183 Chronic kidney disease, stage 3 (moderate): Secondary | ICD-10-CM

## 2016-03-30 LAB — POCT GLYCOSYLATED HEMOGLOBIN (HGB A1C): Hemoglobin A1C: 6.3

## 2016-03-30 LAB — URINE CULTURE: Culture: >=100000 — AB

## 2016-03-30 NOTE — Progress Notes (Signed)
Subjective:    Patient ID: [name], female    DOB: Mar 31, 1925, 82 y.o.   MRN: [id number]  HPI Pt returns for f/u of diabetes mellitus: DM type: 2 Dx'ed: 99991111 Complications: sensory neuropathy, CVA, renal insuff, and CAD.   Therapy: 6 oral meds.   GDM: never. DKA: never. Severe hypoglycemia: never.   Pancreatitis: never.   Other: she is not on metformin, due to CHF, she is not on actos, due to edema; she is off amaryl, due to renal insuff.  Pt says she'll take insulin if necessary;  She lives dtr [name]).   Interval history:  pt states she feels better in general, since recent TIA and UTI.  no cbg record, but states cbg's are well-controlled Past Medical History  Diagnosis Date  . Sliding hiatal hernia   . Erosive esophagitis   . Peptic stricture of esophagus   . Diverticulosis   . Arthritis   . Hypothyroidism   . Diabetes mellitus     Type I  . Sick sinus syndrome [facility])     s/p Medtronic DDD pacemaker implanted; generator change 02/2012  . Hypertension   . Hyperlipidemia   . GERD (gastroesophageal reflux disease)   . Adenomatous colon polyp 07/2002  . Obesity   . Paroxysmal atrial fibrillation ([place]) 02/04/15    discovered on PPM remote interrogation 6/16  . Coronary artery disease     s/p CABG 2001 x 5  . CVA (cerebral vascular accident) [facility]) 2013 or 2014    Dr Evelena Leyden, Neurology  . Dementia   . History of swelling of feet     props up at night, circulations has been checked inlegs in past and was told it was ok    Past Surgical History  Procedure Laterality Date  . Pacemaker placement  03/25/2000    by Dr Olevia Perches  . Tonsillectomy and adenoidectomy    . Appendectomy    . Cholecystectomy    . Total abdominal hysterectomy w/ bilateral salpingoophorectomy      Endometiosis  . Replacement total knee  2004    Right  . Colonoscopy w/ polypectomy  2003  . Cataract extraction  05/2005  . Coronary artery bypass graft  02/2000    5 vessel  . Rotator cuff repair  Right 2009  . Permanent pacemaker generator change  03/18/2012    PPM gen change by Dr Rayann Heman  . Total knee arthroplasty Left 07/19/2015    Procedure: LEFT TOTAL KNEE ARTHROPLASTY;  Surgeon: Latanya Maudlin, MD;  Location: [facility] ORS;  Service: Orthopedics;  Laterality: Left;    Social History   Social History  . Marital Status: Widowed    Spouse Name: N/A  . Number of Children: 1  . Years of Education: N/A   Occupational History  . Retired    Social History Main Topics  . Smoking status: Former Research scientist (life sciences)  . Smokeless tobacco: Former Systems developer    Quit date: 11/23/1981     Comment: quit 28+ yrs ago (as of 2012)  . Alcohol Use: No  . Drug Use: No  . Sexual Activity: No   Other Topics Concern  . Not on file   Social History Narrative   Patient lives at home alone. Patient's daughter was with her [name] ) [phone]   Retired.   Education college   Right handed.   Caffeine one cup of coffee daily.   .    Current Outpatient Prescriptions on File Prior to Visit  Medication Sig  Dispense Refill  . acarbose (PRECOSE) 50 MG tablet Take 1 tablet (50 mg total) by mouth 2 (two) times daily with a meal. 60 tablet 11  . acetaminophen (TYLENOL) 500 MG tablet Take 500 mg by mouth every 8 (eight) hours as needed for mild pain. Take 2 tab = 1,000 mg PO Q 8 hours PRN    . amitriptyline (ELAVIL) 25 MG tablet Take 25 mg by mouth at bedtime.    Marland Kitchen apixaban (ELIQUIS) 2.5 MG TABS tablet Take 1 tablet (2.5 mg total) by mouth 2 (two) times daily. 60 tablet 11  . atorvastatin (LIPITOR) 20 MG tablet TAKE 1 TABLET BY MOUTH EVERY DAY (TAKE IN PLACE OF PRAVASTATIN) 30 tablet 3  . bromocriptine (PARLODEL) 2.5 MG tablet TAKE 1 TABLET (2.5 MG TOTAL) BY MOUTH AT BEDTIME. 30 tablet 10  . Calcium-Vitamin D 600-200 MG-UNIT per tablet Take 1 tablet by mouth daily.    . cephALEXin (KEFLEX) 500 MG capsule Take 1 capsule (500 mg total) by mouth 4 (four) times daily. 30 capsule 0  . colesevelam (WELCHOL) 625 MG tablet  Take 3 tablets (1,875 mg total) by mouth 2 (two) times daily before a meal.    . diltiazem (CARDIZEM) 30 MG tablet Take 1 tablet (30 mg total) by mouth every 6 (six) hours as needed. 120 tablet 3  . docusate sodium (COLACE) 100 MG capsule Take 100 mg by mouth 2 (two) times daily.    Marland Kitchen donepezil (ARICEPT) 10 MG tablet Take 1 tablet (10 mg total) by mouth at bedtime. 90 tablet 3  . enalapril (VASOTEC) 20 MG tablet TAKE 1 TABLET BY MOUTH DAILY 90 tablet 0  . fish oil-omega-3 fatty acids 1000 MG capsule Take 1 g by mouth daily.    . INVOKANA 100 MG TABS tablet TAKE 1 TABLET BY MOUTH EVERY DAY 90 tablet 2  . levothyroxine (SYNTHROID, LEVOTHROID) 25 MCG tablet Take 37.5 mcg by mouth daily before breakfast. Take 1 1/2 tablets daily    . meclizine (ANTIVERT) 25 MG tablet Take 1 tablet (25 mg total) by mouth 3 (three) times daily as needed for dizziness. 20 tablet 0  . memantine (NAMENDA XR) 28 MG CP24 24 hr capsule Take 1 capsule (28 mg total) by mouth daily. 90 capsule 3  . metoprolol (TOPROL-XL) 200 MG 24 hr tablet TAKE 1 TABLET (200 MG TOTAL) BY MOUTH DAILY. 30 tablet 11  . MYRBETRIQ 25 MG TB24 tablet TAKE 1 TABLET (25 MG TOTAL) BY MOUTH DAILY. 90 tablet 1  . niacin 500 MG tablet Take 500 mg by mouth every evening.     . nitroGLYCERIN (NITROSTAT) 0.3 MG SL tablet Place 1 tablet (0.3 mg total) under the tongue every 5 (five) minutes as needed. For chest pain 90 tablet 3  . pantoprazole (PROTONIX) 40 MG tablet TAKE 1 TABLET BY MOUTH EVERY DAY 30 tablet 11  . repaglinide (PRANDIN) 2 MG tablet TAKE 1 TABLET (2 MG TOTAL) BY MOUTH 3 (THREE) TIMES DAILY BEFORE MEALS. 90 tablet 11  . sitaGLIPtin (JANUVIA) 100 MG tablet Take 50 mg by mouth daily. Take half tablet daily    . traMADol (ULTRAM) 50 MG tablet Take 1-2 tablets (50-100 mg total) by mouth every 6 (six) hours as needed for moderate pain. (Patient taking differently: Take 50 mg by mouth at bedtime. ) 80 tablet 0   Current Facility-Administered  Medications on File Prior to Visit  Medication Dose Route Frequency Provider Last Rate Last Dose  . bupivacaine liposome (EXPAREL) 1.3 %  injection 266 mg  20 mL Infiltration Once [employer], PA-C        Allergies  Allergen Reactions  . Codeine Rash  . Robaxin [Methocarbamol] Other (See Comments)    Too strong     Family History  Problem Relation Age of Onset  . Colon cancer Mother 38  . Breast cancer Mother   . Transient ischemic attack Mother   . Cancer Mother   . Hypertension Mother   . Stroke Mother   . Diabetes Mother   . Colon cancer Brother 43  . Cancer Brother   . Hypertension Brother   . Heart attack Brother   . Breast cancer Sister   . Cancer Sister   . Diabetes Sister   . Prostate cancer Brother   . Heart attack Brother   . Heart disease      3 brothers had MI; 1 pre 9  . Hypertension Sister   . Heart attack Daughter   . Diabetes Maternal Aunt     BP 110/80 mmHg  Pulse 75  Ht 5' 5.5" (1.664 m)  Wt 203 lb (92.08 kg)  BMI 33.26 kg/m2  SpO2 97%  Review of Systems She denies hypoglycemia.    Objective:   Physical Exam GENERAL: no distress. In wheelchair VITAL SIGNS:  See vs page SKIN: no ulcer on the feet. feet are of normal color and temp. there is a vein harvest scar on the right leg.  EXTEMITIES: no deformity. no ulcer on the feet.  CV: Trace bilat leg edema. There are bilat varicosities.  PULSES: dorsalis pedis intact bilat.  NEURO: sensation is intact to touch on the feet, but decreased from normal.    A1c=6.3%    Assessment & Plan:  DM: well-controlled.    Patient is advised the following: Patient Instructions  Please continue the same medications.  check your blood sugar once a day.  vary the time of day when you check, between before the 3 meals, and at bedtime.  also check if you have symptoms of your blood sugar being too high or too low.  please keep a record of the readings and bring it to your next appointment here.   please call us sooner if your blood sugar goes below 70, or if you have a lot of readings over 200.   Please come back for a follow-up appointment in 4-5 months.  Please see Vaughan Basta the same day, to learn how to take insulin, just in case.

## 2016-03-30 NOTE — Patient Instructions (Addendum)
Please continue the same medications.  check your blood sugar once a day.  vary the time of day when you check, between before the 3 meals, and at bedtime.  also check if you have symptoms of your blood sugar being too high or too low.  please keep a record of the readings and bring it to your next appointment here.  please call us sooner if your blood sugar goes below 70, or if you have a lot of readings over 200.   Please come back for a follow-up appointment in 4-5 months.  Please see Vaughan Basta the same day, to learn how to take insulin, just in case.

## 2016-03-31 ENCOUNTER — Telehealth: Payer: Self-pay

## 2016-03-31 ENCOUNTER — Ambulatory Visit (INDEPENDENT_AMBULATORY_CARE_PROVIDER_SITE_OTHER): Payer: Medicare Other

## 2016-03-31 ENCOUNTER — Telehealth (HOSPITAL_BASED_OUTPATIENT_CLINIC_OR_DEPARTMENT_OTHER): Payer: Self-pay | Admitting: Emergency Medicine

## 2016-03-31 VITALS — BP 106/60 | Ht 65.0 in | Wt 204.8 lb

## 2016-03-31 DIAGNOSIS — Z23 Encounter for immunization: Secondary | ICD-10-CM | POA: Diagnosis not present

## 2016-03-31 DIAGNOSIS — Z0000 Encounter for general adult medical examination without abnormal findings: Secondary | ICD-10-CM

## 2016-03-31 MED ORDER — CIPROFLOXACIN HCL 250 MG PO TABS: 250.0000 mg | ORAL_TABLET | Freq: Two times a day (BID) | ORAL | Status: DC

## 2016-03-31 NOTE — Telephone Encounter (Signed)
Post ED Visit - Positive Culture Follow-up  Culture report reviewed by antimicrobial stewardship pharmacist:  []  Elenor Quinones, Pharm.D. []  Heide Guile, Pharm.D., BCPS []  Parks Neptune, Pharm.D. []  Alycia Rossetti, Pharm.D., BCPS []  [place], [place].D., BCPS, AAHIVP []  Legrand Como, Pharm.D., BCPS, AAHIVP [x]  Milus Glazier, Pharm.D. []  Stephens November, Pharm.D.  Positive urine culture Treated with cephalexin, organism sensitive to the same and no further patient follow-up is required at this time.  Hazle Nordmann 03/31/2016, 9:17 AM

## 2016-03-31 NOTE — Progress Notes (Addendum)
Subjective:   [name] is a 82 y.o. female who presents for Medicare Annual (Subsequent) preventive examination.  Review of Systems:  HRA assessment completed during visit; [name]  The Patient was informed that this wellness visit is to identify risk and educate on how to reduce risk for increase disease through lifestyle changes.   ROS deferred to CPE exam with physician  Family and medical hx given below;   Was living independently  for 9 months; had townhome; now lives with [name] and spouse since knee surgery. Prefers to live independently;   Medical Left knee replaced in august of 2016/ Stroke prior to knee surgery 2013;   Living with [name] and no recent falls Was in ER on Sat; [name] checked on her and she was asleep and couldn't wake up; Voice and speech were gargled; confused / has dementia DX with UTI / now on Keflex but having diarrhea after each dose.  Will check with Dr. Quay Burow  DM; Under good control;  Lipids 08/2014; cho 158; Trig 274; HDL 46 and LDL 69 (A1c 6.2) PT dc March 2017; Pt felt functional goals were met;    [name] states [name] sometimes gets anxious and tearful; discussed mood; Asked the patient how she felt and the patient articulated very well that she feels she has no control in her life and misses her apt and independence. Also feels she is a "burden" on family. She was very active and now misses her freedom but admits to needing help. Family reassures that she is not a burden but the patient feels she is; Suggested her consideration of going to a [name] or [facility] [employer]; The [name] stated they have one near their home. Also recommended seeing if there is any particular program she may enjoy; Hobbies include reading; The patient stated this may be helpful. Given resources to guide in decision making  Will seek apt with Dr. Quay Burow if add'l socialization does not help as the patient may benefit for mild anti-depressant.  Does not present  as anxious today and denies overt depression; Still adjusting to losses of home and independence;  Lifestyle review:  Some dependence on ADLS but overall takes her own bath once set up; [name] is in the area; Stays close by; Gets up in the am and toilets independently.   Tobacco: Quit 1983  Medication review/ Adherence On parlodel was put on this due to shuffling her feet; Will discuss with neurologist next week. [name] manages meds and IADLs  BMI: 33.9 Diet;  Eats 3 meals; sometimes sleeps late;  Bowl of cereal and banana; eggs x 2 per week;  Lunch; apple and peanut butter;  Supper; has a meal;  Vegetables and fruit  Exercise;  PT; was told to walk daily; walk down driveway 60 ft plus; (148f) States some day she can't walk. Discussed alternatives as attending Kathy exercise class or taking steps in the home.   HOME SAFETY; Lives with [name]; [name] [phone] has her one level town home. No plans to sell. Home has small hallways etc, but may consider w/c to help get [name] more; Educated regarding energy conservation with chair which gives her more energy to do other things she enjoys as extended trips with family.  Gets out of bed independently; gets walker and walks to bathroom; [name] helps  in tub with shower chair;  [name] helps pick up left leg; the patient is then independent;  Independent in dress.  [name] helps with socks and shoes;   Community safety; yes Smoke detectors yes  Depression: discussed and is at risk due to losses Will try social engagement to help the patient feel more independent; decrease family burden; The patient voices interest   Cognitive; Presents with no issues;  Engaged in assessment States memory is not perfect and better some days than others/ Memory multiple etiology secondary to CABG; stroke; mood; chronic illness; Stayed fully engaged today; responded appropriately with problem solving for self and family. Articulate; able to express  herself.  Patient presents with issues; MMSE wavied by the patient, as she will see neurology next week and did not want to have to take again.   Fall assessment / no recent falls; appears stable; PT was just in home;  Get up and go is problematic; "rocks back and forth" to get up from chair and then balances. Family considering lift chair. Suggested she try these out prior to getting at times they are helpful and sometimes place the patient more at risk. Will seek medical guidance.   Mobilization and Functional losses from last year to this year? Yes; loss of independent living   Sleep pattern changes; sleeps in lounge chair where she more comfortable   Urinary or fecal incontinence reviewed/ some urgency but no fecal incontinence; still having diarrhea now.   Lifeline: http://www.lifelinesys.com/content/home; [phone] [phone] / Has lifeline button on wrist   Given information regarding resources; [name];    Adult day care; [name];  [name]; [name] has activities for seniors near by   Given information on the [employer] and caregivers group.   Counseling Health Maintenance Colonoscopy; 09/2011- no more EKG: 03/2016  Mammogram: 01/2015/ Q other year 01/2017 Dexa: 11/2014 -Due  Hearing: '4000hz'$  ability right ; 2000 hz on left   Ophthalmology exam; 09/30/2015  Immunizations Due: TD ; agrees to take Tdap and given today Zostavax  Advanced Directive; YES  Health Recommendations and Referrals To get out and socialize more; discussed use of w/c for energy conservation so that she can complete exercise every day and travel more.   Current Care Team reviewed and updated Dr. Loanne Drilling Dr. Rayann Heman     Cardiac Risk Factors include: advanced age (>54mn, >>38women);diabetes mellitus;dyslipidemia;hypertension;sedentary lifestyle;obesity (BMI >30kg/m2)     Objective:      Vitals: BP 106/60 mmHg  Ht '5\' 5"'$  (1.651 m)  Wt 204 lb 12 oz (92.874 kg)  BMI 34.07 kg/m2  Body mass index is 34.07 kg/(m^2).   Tobacco History  Smoking status  . Former Smoker  Smokeless tobacco  . Former [employer] developer . Quit date: 11/23/1981    Comment: quit 28+ yrs ago (as of 2012)     Counseling given: Not Answered   Past Medical History  Diagnosis Date  . Sliding hiatal hernia   . Erosive esophagitis   . Peptic stricture of esophagus   . Diverticulosis   . Arthritis   . Hypothyroidism   . Diabetes mellitus     Type I  . Sick sinus syndrome ([facility]     s/p Medtronic DDD pacemaker implanted; generator change 02/2012  . Hypertension   . Hyperlipidemia   . GERD (gastroesophageal reflux disease)   . Adenomatous colon polyp 07/2002  . Obesity   . Paroxysmal atrial fibrillation ([facility] 02/04/15    discovered on PPM remote interrogation 6/16  . Coronary artery disease     s/p CABG 2001 x 5  .  CVA (cerebral vascular accident) [facility]) 2013 or 2014    Dr Evelena Leyden, Neurology  . Dementia   . History of swelling of feet     props up at night, circulations has been checked inlegs in past and was told it was ok   Past Surgical History  Procedure Laterality Date  . Pacemaker placement  03/25/2000    by Dr Olevia Perches  . Tonsillectomy and adenoidectomy    . Appendectomy    . Cholecystectomy    . Total abdominal hysterectomy w/ bilateral salpingoophorectomy      Endometiosis  . Replacement total knee  2004    Right  . Colonoscopy w/ polypectomy  2003  . Cataract extraction  05/2005  . Coronary artery bypass graft  02/2000    5 vessel  . Rotator cuff repair Right 2009  . Permanent pacemaker generator change  03/18/2012    PPM gen change by Dr Rayann Heman  . Total knee arthroplasty Left 07/19/2015    Procedure: LEFT TOTAL KNEE ARTHROPLASTY;  Surgeon: Latanya Maudlin, MD;  Location: [facility];  Service: Orthopedics;  Laterality: Left;   Family History  Problem Relation Age of Onset  . Colon cancer  Mother 2  . Breast cancer Mother   . Transient ischemic attack Mother   . Cancer Mother   . Hypertension Mother   . Stroke Mother   . Diabetes Mother   . Colon cancer Brother 74  . Cancer Brother   . Hypertension Brother   . Heart attack Brother   . Breast cancer Sister   . Cancer Sister   . Diabetes Sister   . Prostate cancer Brother   . Heart attack Brother   . Heart disease      3 brothers had MI; 1 pre 21  . Hypertension Sister   . Heart attack Daughter   . Diabetes Maternal Aunt    History  Sexual Activity  . Sexual Activity: No    Outpatient Encounter Prescriptions as of 03/31/2016  Medication Sig  . acarbose (PRECOSE) 50 MG tablet Take 1 tablet (50 mg total) by mouth 2 (two) times daily with a meal.  . acetaminophen (TYLENOL) 500 MG tablet Take 500 mg by mouth every 8 (eight) hours as needed for mild pain. Take 2 tab = 1,000 mg PO Q 8 hours PRN  . amitriptyline (ELAVIL) 25 MG tablet Take 25 mg by mouth at bedtime.  Marland Kitchen apixaban (ELIQUIS) 2.5 MG TABS tablet Take 1 tablet (2.5 mg total) by mouth 2 (two) times daily.  Marland Kitchen atorvastatin (LIPITOR) 20 MG tablet TAKE 1 TABLET BY MOUTH EVERY DAY (TAKE IN PLACE OF PRAVASTATIN)  . bromocriptine (PARLODEL) 2.5 MG tablet TAKE 1 TABLET (2.5 MG TOTAL) BY MOUTH AT BEDTIME.  . Calcium-Vitamin D 600-200 MG-UNIT per tablet Take 1 tablet by mouth daily.  . cephALEXin (KEFLEX) 500 MG capsule Take 1 capsule (500 mg total) by mouth 4 (four) times daily.  . colesevelam (WELCHOL) 625 MG tablet Take 3 tablets (1,875 mg total) by mouth 2 (two) times daily before a meal.  . diltiazem (CARDIZEM) 30 MG tablet Take 1 tablet (30 mg total) by mouth every 6 (six) hours as needed.  . docusate sodium (COLACE) 100 MG capsule Take 100 mg by mouth 2 (two) times daily.  Marland Kitchen donepezil (ARICEPT) 10 MG tablet Take 1 tablet (10 mg total) by mouth at bedtime.  . enalapril (VASOTEC) 20 MG tablet TAKE 1 TABLET BY MOUTH DAILY  . fish oil-omega-3 fatty acids 1000 MG  capsule Take 1 g by mouth daily.  . INVOKANA 100 MG TABS tablet TAKE 1 TABLET BY MOUTH EVERY DAY  . levothyroxine (SYNTHROID, LEVOTHROID) 25 MCG tablet Take 37.5 mcg by mouth daily before breakfast. Take 1 1/2 tablets daily  . meclizine (ANTIVERT) 25 MG tablet Take 1 tablet (25 mg total) by mouth 3 (three) times daily as needed for dizziness.  . memantine (NAMENDA XR) 28 MG CP24 24 hr capsule Take 1 capsule (28 mg total) by mouth daily.  . metoprolol (TOPROL-XL) 200 MG 24 hr tablet TAKE 1 TABLET (200 MG TOTAL) BY MOUTH DAILY.  . MYRBETRIQ 25 MG TB24 tablet TAKE 1 TABLET (25 MG TOTAL) BY MOUTH DAILY.  . niacin 500 MG tablet Take 500 mg by mouth every evening.   . nitroGLYCERIN (NITROSTAT) 0.3 MG SL tablet Place 1 tablet (0.3 mg total) under the tongue every 5 (five) minutes as needed. For chest pain  . pantoprazole (PROTONIX) 40 MG tablet TAKE 1 TABLET BY MOUTH EVERY DAY  . repaglinide (PRANDIN) 2 MG tablet TAKE 1 TABLET (2 MG TOTAL) BY MOUTH 3 (THREE) TIMES DAILY BEFORE MEALS.  . sitaGLIPtin (JANUVIA) 100 MG tablet Take 50 mg by mouth daily. Take half tablet daily  . traMADol (ULTRAM) 50 MG tablet Take 1-2 tablets (50-100 mg total) by mouth every 6 (six) hours as needed for moderate pain. (Patient taking differently: Take 50 mg by mouth at bedtime. )   Facility-Administered Encounter Medications as of 03/31/2016  Medication  . bupivacaine liposome (EXPAREL) 1.3 % injection 266 mg    Activities of Daily Living In your present state of health, do you have any difficulty performing the following activities: 03/31/2016 07/19/2015  Hearing? N N  Vision? N N  Difficulty concentrating or making decisions? Tempie Donning  Walking or climbing stairs? Y Y  Dressing or bathing? Y N  Doing errands, shopping? Y N  Preparing Food and eating ? Y -  Using the Toilet? N -  In the past six months, have you accidently leaked urine? Y -  Do you have problems with loss of bowel control? N -  Managing your Medications? Y -   Managing your Finances? Y -  Housekeeping or managing your Housekeeping? Y -    Patient Care Team: Binnie Rail, MD as PCP - General (Internal Medicine)    Assessment:      Exercise Activities and Dietary recommendations Current Exercise Habits: Home exercise routine, Type of exercise: walking, Time (Minutes): 30, Frequency (Times/Week): 5 (Discussed walking and exercise class; ), Weekly Exercise (Minutes/Week): 150, Intensity: Mild  Goals    . patient     To continue to be more independent;  May try Adult Enrichment or [name]\  [place]  Address: [address] [place], [place] [address]  Phone: [phone]  Hours:  Open today  8:30AM-5PM  Call the Kathy Line 911; they can give you more direct help Address: [address] [place], [place] [address]  Phone: [phone]       Fall Risk Fall Risk  03/31/2016 03/21/2015  Falls in the past year? No Yes  Number falls in past yr: - 1  Injury with Fall? - No  Risk for fall due to : - Impaired vision;Impaired balance/gait   Depression Screen PHQ 2/9 Scores 03/31/2016 03/21/2015  PHQ - 2 Score 1 1    Agreed to more socialization  Cognitive Testing No flowsheet data found.  Deferred for neuro OV next  week;   Immunization History  Administered Date(s) Administered  . Influenza Whole 10/13/2007, 08/14/2008, 08/23/2013  . Influenza,inj,Quad PF,36+ Mos 08/27/2014  . Influenza-Unspecified 08/24/2015  . PPD Test 07/22/2015  . Pneumococcal Conjugate-13 12/31/2015  . Tdap 03/31/2016   Screening Tests Health Maintenance  Topic Date Due  . TETANUS/TDAP  12019-10-646  . ZOSTAVAX  12019-10-687  . DEXA SCAN  12019-10-692  . INFLUENZA VACCINE  06/23/2016  . OPHTHALMOLOGY EXAM  09/29/2016  . HEMOGLOBIN A1C  09/30/2016  . FOOT EXAM  10/28/2016  . PNA vac Low Risk Adult (2 of 2 - PPSV23) 12/30/2016      Plan:     Agreed to take Tdap vaccine today; (left deltoid)   Will defer to  Dr. Quay Burow 1. Raised skin moles; the patient states they itch; on chest and back; varies in size; skin tone color. 2. Diarrhea from Keflex  During the course of the visit the patient was educated and counseled about the following appropriate screening and preventive services:   Vaccines to include Pneumoccal, Influenza, Hepatitis B, Td, Zostavax, HCV  Electrocardiogram/ 03/2016  Cardiovascular Disease/ deferred to cardiology  Colorectal cancer screening/aged out  Bone density screening deferred   Diabetes screening/ Neg  Glaucoma screening/ neg  Mammography/2016  Nutrition counseling   Patient Instructions (the written plan) was given to the patient.   [email or web address],[email or web address], RN  03/31/2016     Medical screening examination/treatment/procedure(s) were performed by non-physician practitioner and as supervising physician I was immediately available for consultation/collaboration. I agree with above. Binnie Rail, MD

## 2016-03-31 NOTE — Telephone Encounter (Signed)
Spoke with pts daughter to inform.  

## 2016-03-31 NOTE — Telephone Encounter (Signed)
Stop the keflex.  Start new antibiotic - cipro - stop if she has any tendon inflammation with antibiotic.  If diarrhea does not stop please call

## 2016-03-31 NOTE — Addendum Note (Signed)
Addended by: Binnie Rail on: 03/31/2016 03:54 PM   Modules accepted: Orders

## 2016-03-31 NOTE — Patient Instructions (Addendum)
[name] , Thank you for taking time to come for your Medicare Wellness Visit. I appreciate your ongoing commitment to your health goals. Please review the following plan we discussed and let me know if I can assist you in the future.   Discussed goals  Will continue to exercise every day; Will exercise in home when she can't get out; standing in place or will try to go to exercise class at [facility] or other; [facility])  Agreed to take Tdap vaccine today; (left deltoid)   Will defer to Dr. Quay Burow 1. Raised skin moles; the patient states they itch; on chest and back; varies in size; skin tone color. 2. Diarrhea from Keflex    These are the goals we discussed: Goals    . patient     To continue to be more independent;  May try [facility] or [facility]\  [place]  Address: [address], [place], [place] [address]  Phone: [phone]  Hours:  Open today  8:30AM-5PM  Call the Senior Line 911; they can give you more direct help Address: [address] [place], [place] [address]  Phone: [phone]        This is a list of the screening recommended for you and due dates:  Health Maintenance  Topic Date Due  . Tetanus Vaccine  106-14-1945  . Shingles Vaccine  106-14-1988  . DEXA scan (bone density measurement)  106-14-1993  . Flu Shot  06/23/2016  . Eye exam for diabetics  09/29/2016  . Hemoglobin A1C  09/30/2016  . Complete foot exam   10/28/2016  . Pneumonia vaccines (2 of 2 - PPSV23) 12/30/2016     Fall Prevention in the Home  Falls can cause injuries. They can happen to people of all ages. There are many things you can do to make your home safe and to help prevent falls.  WHAT CAN I DO ON THE OUTSIDE OF MY HOME?  Regularly fix the edges of walkways and driveways and fix any cracks.  Remove anything that might make you trip as you walk through a door, such as a raised step or threshold.  Trim any bushes or trees on  the path to your home.  Use bright outdoor lighting.  Clear any walking paths of anything that might make someone trip, such as rocks or tools.  Regularly check to see if handrails are loose or broken. Make sure that both sides of any steps have handrails.  Any raised decks and porches should have guardrails on the edges.  Have any leaves, snow, or ice cleared regularly.  Use sand or salt on walking paths during winter.  Clean up any spills in your garage right away. This includes oil or grease spills. WHAT CAN I DO IN THE BATHROOM?   Use night lights.  Install grab bars by the toilet and in the tub and shower. Do not use towel bars as grab bars.  Use non-skid mats or decals in the tub or shower.  If you need to sit down in the shower, use a plastic, non-slip stool.  Keep the floor dry. Clean up any water that spills on the floor as soon as it happens.  Remove soap buildup in the tub or shower regularly.  Attach bath mats securely with double-sided non-slip rug tape.  Do not have throw rugs and other things on the floor that can make you trip. WHAT CAN I DO IN THE BEDROOM?  Use  night lights.  Make sure that you have a light by your bed that is easy to reach.  Do not use any sheets or blankets that are too big for your bed. They should not hang down onto the floor.  Have a firm chair that has side arms. You can use this for support while you get dressed.  Do not have throw rugs and other things on the floor that can make you trip. WHAT CAN I DO IN THE KITCHEN?  Clean up any spills right away.  Avoid walking on wet floors.  Keep items that you use a lot in easy-to-reach places.  If you need to reach something above you, use a strong step stool that has a grab bar.  Keep electrical cords out of the way.  Do not use floor polish or wax that makes floors slippery. If you must use wax, use non-skid floor wax.  Do not have throw rugs and other things on the floor  that can make you trip. WHAT CAN I DO WITH MY STAIRS?  Do not leave any items on the stairs.  Make sure that there are handrails on both sides of the stairs and use them. Fix handrails that are broken or loose. Make sure that handrails are as long as the stairways.  Check any carpeting to make sure that it is firmly attached to the stairs. Fix any carpet that is loose or worn.  Avoid having throw rugs at the top or bottom of the stairs. If you do have throw rugs, attach them to the floor with carpet tape.  Make sure that you have a light switch at the top of the stairs and the bottom of the stairs. If you do not have them, ask someone to add them for you. WHAT ELSE CAN I DO TO HELP PREVENT FALLS?  Wear shoes that:  Do not have high heels.  Have rubber bottoms.  Are comfortable and fit you well.  Are closed at the toe. Do not wear sandals.  If you use a stepladder:  Make sure that it is fully opened. Do not climb a closed stepladder.  Make sure that both sides of the stepladder are locked into place.  Ask someone to hold it for you, if possible.  Clearly mark and make sure that you can see:  Any grab bars or handrails.  First and last steps.  Where the edge of each step is.  Use tools that help you move around (mobility aids) if they are needed. These include:  Canes.  Walkers.  Scooters.  Crutches.  Turn on the lights when you go into a dark area. Replace any light bulbs as soon as they burn out.  Set up your furniture so you have a clear path. Avoid moving your furniture around.  If any of your floors are uneven, fix them.  If there are any pets around you, be aware of where they are.  Review your medicines with your doctor. Some medicines can make you feel dizzy. This can increase your chance of falling. Ask your doctor what other things that you can do to help prevent falls.   This information is not intended to replace advice given to you by your  health care provider. Make sure you discuss any questions you have with your health care provider.   Document Released: 09/05/2009 Document Revised: 03/26/2015 Document Reviewed: 12/14/2014 Elsevier Interactive Patient Education 2016 [employer].  Health Maintenance, Female Adopting a healthy lifestyle and getting  preventive care can go a long way to promote health and wellness. Talk with your health care provider about what schedule of regular examinations is right for you. This is a good chance for you to check in with your provider about disease prevention and staying healthy. In between checkups, there are plenty of things you can do on your own. Experts have done a lot of research about which lifestyle changes and preventive measures are most likely to keep you healthy. Ask your health care provider for more information. WEIGHT AND DIET  Eat a healthy diet  Be sure to include plenty of vegetables, fruits, low-fat dairy products, and lean protein.  Do not eat a lot of foods high in solid fats, added sugars, or salt.  Get regular exercise. This is one of the most important things you can do for your health.  Most adults should exercise for at least 150 minutes each week. The exercise should increase your heart rate and make you sweat (moderate-intensity exercise).  Most adults should also do strengthening exercises at least twice a week. This is in addition to the moderate-intensity exercise.  Maintain a healthy weight  Body mass index (BMI) is a measurement that can be used to identify possible weight problems. It estimates body fat based on height and weight. Your health care provider can help determine your BMI and help you achieve or maintain a healthy weight.  For females 10 years of age and older:   A BMI below 18.5 is considered underweight.  A BMI of 18.5 to 24.9 is normal.  A BMI of 25 to 29.9 is considered overweight.  A BMI of 30 and above is considered obese.   Watch levels of cholesterol and blood lipids  You should start having your blood tested for lipids and cholesterol at 80 years of age, then have this test every 5 years.  You may need to have your cholesterol levels checked more often if:  Your lipid or cholesterol levels are high.  You are older than 80 years of age.  You are at high risk for heart disease.  CANCER SCREENING   Lung Cancer  Lung cancer screening is recommended for adults 31-60 years old who are at high risk for lung cancer because of a history of smoking.  A yearly low-dose CT scan of the lungs is recommended for people who:  Currently smoke.  Have quit within the past 15 years.  Have at least a 30-pack-year history of smoking. A pack year is smoking an average of one pack of cigarettes a day for 1 year.  Yearly screening should continue until it has been 15 years since you quit.  Yearly screening should stop if you develop a health problem that would prevent you from having lung cancer treatment.  Breast Cancer  Practice breast self-awareness. This means understanding how your breasts normally appear and feel.  It also means doing regular breast self-exams. Let your health care provider know about any changes, no matter how small.  If you are in your 20s or 30s, you should have a clinical breast exam (CBE) by a health care provider every 1-3 years as part of a regular health exam.  If you are 44 or older, have a CBE every year. Also consider having a breast X-ray (mammogram) every year.  If you have a family history of breast cancer, talk to your health care provider about genetic screening.  If you are at high risk for breast cancer, talk to  your health care provider about having an MRI and a mammogram every year.  Breast cancer gene (BRCA) assessment is recommended for women who have family members with BRCA-related cancers. BRCA-related cancers  include:  Breast.  Ovarian.  Tubal.  Peritoneal cancers.  Results of the assessment will determine the need for genetic counseling and BRCA1 and BRCA2 testing. Cervical Cancer Your health care provider may recommend that you be screened regularly for cancer of the pelvic organs (ovaries, uterus, and vagina). This screening involves a pelvic examination, including checking for microscopic changes to the surface of your cervix (Pap test). You may be encouraged to have this screening done every 3 years, beginning at age 87.  For women ages 27-65, health care providers may recommend pelvic exams and Pap testing every 3 years, or they may recommend the Pap and pelvic exam, combined with testing for human papilloma virus (HPV), every 5 years. Some types of HPV increase your risk of cervical cancer. Testing for HPV may also be done on women of any age with unclear Pap test results.  Other health care providers may not recommend any screening for nonpregnant women who are considered low risk for pelvic cancer and who do not have symptoms. Ask your health care provider if a screening pelvic exam is right for you.  If you have had past treatment for cervical cancer or a condition that could lead to cancer, you need Pap tests and screening for cancer for at least 20 years after your treatment. If Pap tests have been discontinued, your risk factors (such as having a new sexual partner) need to be reassessed to determine if screening should resume. Some women have medical problems that increase the chance of getting cervical cancer. In these cases, your health care provider may recommend more frequent screening and Pap tests. Colorectal Cancer  This type of cancer can be detected and often prevented.  Routine colorectal cancer screening usually begins at 80 years of age and continues through 80 years of age.  Your health care provider may recommend screening at an earlier age if you have risk factors for  colon cancer.  Your health care provider may also recommend using home test kits to check for hidden blood in the stool.  A small camera at the end of a tube can be used to examine your colon directly (sigmoidoscopy or colonoscopy). This is done to check for the earliest forms of colorectal cancer.  Routine screening usually begins at age 50.  Direct examination of the colon should be repeated every 5-10 years through 80 years of age. However, you may need to be screened more often if early forms of precancerous polyps or small growths are found. Skin Cancer  Check your skin from head to toe regularly.  Tell your health care provider about any new moles or changes in moles, especially if there is a change in a mole's shape or color.  Also tell your health care provider if you have a mole that is larger than the size of a pencil eraser.  Always use sunscreen. Apply sunscreen liberally and repeatedly throughout the day.  Protect yourself by wearing long sleeves, pants, a wide-brimmed hat, and sunglasses whenever you are outside. HEART DISEASE, DIABETES, AND HIGH BLOOD PRESSURE   High blood pressure causes heart disease and increases the risk of stroke. High blood pressure is more likely to develop in:  People who have blood pressure in the high end of the normal range (130-139/85-89 mm Hg).  People  who are overweight or obese.  People who are African American.  If you are 39-30 years of age, have your blood pressure checked every 3-5 years. If you are 26 years of age or older, have your blood pressure checked every year. You should have your blood pressure measured twice--once when you are at a hospital or clinic, and once when you are not at a hospital or clinic. Record the average of the two measurements. To check your blood pressure when you are not at a hospital or clinic, you can use:  An automated blood pressure machine at a pharmacy.  A home blood pressure monitor.  If you  are between 35 years and 68 years old, ask your health care provider if you should take aspirin to prevent strokes.  Have regular diabetes screenings. This involves taking a blood sample to check your fasting blood sugar level.  If you are at a normal weight and have a low risk for diabetes, have this test once every three years after 80 years of age.  If you are overweight and have a high risk for diabetes, consider being tested at a younger age or more often. PREVENTING INFECTION  Hepatitis B  If you have a higher risk for hepatitis B, you should be screened for this virus. You are considered at high risk for hepatitis B if:  You were born in a country where hepatitis B is common. Ask your health care provider which countries are considered high risk.  Your parents were born in a high-risk country, and you have not been immunized against hepatitis B (hepatitis B vaccine).  You have HIV or AIDS.  You use needles to inject street drugs.  You live with someone who has hepatitis B.  You have had sex with someone who has hepatitis B.  You get hemodialysis treatment.  You take certain medicines for conditions, including cancer, organ transplantation, and autoimmune conditions. Hepatitis C  Blood testing is recommended for:  Everyone born from [birth year] through 1963.  Anyone with known risk factors for hepatitis C. Sexually transmitted infections (STIs)  You should be screened for sexually transmitted infections (STIs) including gonorrhea and chlamydia if:  You are sexually active and are younger than 80 years of age.  You are older than 80 years of age and your health care provider tells you that you are at risk for this type of infection.  Your sexual activity has changed since you were last screened and you are at an increased risk for chlamydia or gonorrhea. Ask your health care provider if you are at risk.  If you do not have HIV, but are at risk, it may be recommended that you  take a prescription medicine daily to prevent HIV infection. This is called pre-exposure prophylaxis (PrEP). You are considered at risk if:  You are sexually active and do not regularly use condoms or know the HIV status of your partner(s).  You take drugs by injection.  You are sexually active with a partner who has HIV. Talk with your health care provider about whether you are at high risk of being infected with HIV. If you choose to begin PrEP, you should first be tested for HIV. You should then be tested every 3 months for as long as you are taking PrEP.  PREGNANCY   If you are premenopausal and you may become pregnant, ask your health care provider about preconception counseling.  If you may become pregnant, take 400 to 800 micrograms (mcg) of  folic acid every day.  If you want to prevent pregnancy, talk to your health care provider about birth control (contraception). OSTEOPOROSIS AND MENOPAUSE   Osteoporosis is a disease in which the bones lose minerals and strength with aging. This can result in serious bone fractures. Your risk for osteoporosis can be identified using a bone density scan.  If you are 62 years of age or older, or if you are at risk for osteoporosis and fractures, ask your health care provider if you should be screened.  Ask your health care provider whether you should take a calcium or vitamin D supplement to lower your risk for osteoporosis.  Menopause may have certain physical symptoms and risks.  Hormone replacement therapy may reduce some of these symptoms and risks. Talk to your health care provider about whether hormone replacement therapy is right for you.  HOME CARE INSTRUCTIONS   Schedule regular health, dental, and eye exams.  Stay current with your immunizations.   Do not use any tobacco products including cigarettes, chewing tobacco, or electronic cigarettes.  If you are pregnant, do not drink alcohol.  If you are breastfeeding, limit how  much and how often you drink alcohol.  Limit alcohol intake to no more than 1 drink per day for nonpregnant women. One drink equals 12 ounces of beer, 5 ounces of wine, or 1 ounces of hard liquor.  Do not use street drugs.  Do not share needles.  Ask your health care provider for help if you need support or information about quitting drugs.  Tell your health care provider if you often feel depressed.  Tell your health care provider if you have ever been abused or do not feel safe at home.   This information is not intended to replace advice given to you by your health care provider. Make sure you discuss any questions you have with your health care provider.   Document Released: 05/25/2011 Document Revised: 11/30/2014 Document Reviewed: 10/11/2013 Elsevier Interactive Patient Education [employer].

## 2016-03-31 NOTE — Telephone Encounter (Signed)
The patient came in with dtr for AWV; Stated she was having episodes of diarrhea;  This is day 3; ER culture report back; Please advise as to recommendation;

## 2016-03-31 NOTE — Telephone Encounter (Signed)
Pt is currently taking Keflex given by the ED

## 2016-04-01 ENCOUNTER — Encounter: Payer: Self-pay | Admitting: Cardiology

## 2016-04-02 ENCOUNTER — Telehealth: Payer: Self-pay | Admitting: Emergency Medicine

## 2016-04-02 ENCOUNTER — Other Ambulatory Visit: Payer: Self-pay | Admitting: Nurse Practitioner

## 2016-04-02 ENCOUNTER — Telehealth: Payer: Self-pay | Admitting: Internal Medicine

## 2016-04-02 MED ORDER — LEVOTHYROXINE SODIUM 25 MCG PO TABS: 37.5000 ug | ORAL_TABLET | Freq: Every day | ORAL | Status: DC

## 2016-04-02 NOTE — Telephone Encounter (Signed)
Received refill request from [facility] for Levothyroxine 25 mCG. Pt has not has TSH drawn since 12/2014. Please advise if pt needs labs with refill.

## 2016-04-02 NOTE — Telephone Encounter (Signed)
Spoke with pts daughter to inform.  

## 2016-04-02 NOTE — Telephone Encounter (Signed)
error 

## 2016-04-02 NOTE — Telephone Encounter (Signed)
She can take plain mucinex (without decongestant) or coricidan products.

## 2016-04-02 NOTE — Telephone Encounter (Signed)
Ok to refill - it is managed by her endocrinologist

## 2016-04-02 NOTE — Telephone Encounter (Signed)
PLEASE NOTE: All timestamps contained within this report are represented as [place] Standard Time. CONFIDENTIALTY NOTICE: This fax transmission is intended only for the addressee. It contains information that is legally privileged, confidential or otherwise protected from use or disclosure. If you are not the intended recipient, you are strictly prohibited from reviewing, disclosing, copying using or disseminating any of this information or taking any action in reliance on or regarding this information. If you have received this fax in error, please notify us immediately by telephone so that we can arrange for its return to [place]. Phone: [phone], Toll-Free: [phone], Fax: [phone] Page: 1 of 1 Call Id: [id number] [place] Day - Client [place] Patient Name: [name] DOB: 10/12/1925 Initial Comment Caller states her mother has cold like sx and she wants to know what she can take with all the other scripts she was prescribed. Nurse Assessment Nurse: Dimas Chyle, RN, Dellis Filbert Date/Time Eilene Ghazi Time): 04/02/2016 10:24:31 AM Confirm and document reason for call. If symptomatic, describe symptoms. You must click the next button to save text entered. ---Caller states her mother has cold like sx and she wants to know what she can take with all the other scripts she was prescribed. Has the patient traveled out of the country within the last 30 days? ---No Does the patient have any new or worsening symptoms? ---Yes Will a triage be completed? ---Yes Related visit to physician within the last 2 weeks? ---Yes Does the PT have any chronic conditions? (i.e. diabetes, asthma, etc.) ---Yes List chronic conditions. ---HTN, Diabetes type 2, hyperlipidemia Is this a behavioral health or substance abuse call? ---No Guidelines Guideline Title Affirmed Question Affirmed Notes Common Cold [1] Using nasal washes and pain medicine > 24  hours AND [2] sinus pain (lower forehead, cheekbone, or eye) persists Final Disposition User See PCP When Office is Open (within 3 days) Dimas Chyle, RN, Dellis Filbert Comments Advised caller that PCP would have to make recommendation on OTC meds for use with cold symptoms since patient is on B/P meds. Referrals REFERRED TO PCP OFFICE Disagree/Comply: Comply

## 2016-04-02 NOTE — Telephone Encounter (Signed)
Please advise what cold medications pt can take along with her routine meds.

## 2016-04-03 ENCOUNTER — Telehealth: Payer: Self-pay

## 2016-04-03 DIAGNOSIS — L989 Disorder of the skin and subcutaneous tissue, unspecified: Secondary | ICD-10-CM

## 2016-04-03 NOTE — Telephone Encounter (Signed)
During AWV; the patient c/o of dermatology issues; raised moles to chest and back; States they itch. Do you want to schedule or is it ok to refer to Dermatology;   Please advise, Tks

## 2016-04-04 NOTE — Telephone Encounter (Signed)
Derm referral ordered

## 2016-04-06 NOTE — Telephone Encounter (Signed)
Spoke with pts daughter to inform.  

## 2016-04-09 ENCOUNTER — Ambulatory Visit (INDEPENDENT_AMBULATORY_CARE_PROVIDER_SITE_OTHER): Payer: Medicare Other | Admitting: Nurse Practitioner

## 2016-04-09 ENCOUNTER — Encounter: Payer: Self-pay | Admitting: Nurse Practitioner

## 2016-04-09 VITALS — BP 116/54 | HR 72 | Ht 65.0 in | Wt 204.2 lb

## 2016-04-09 DIAGNOSIS — R413 Other amnesia: Secondary | ICD-10-CM | POA: Diagnosis not present

## 2016-04-09 MED ORDER — DONEPEZIL HCL 10 MG PO TABS: ORAL_TABLET | ORAL | Status: DC

## 2016-04-09 MED ORDER — MEMANTINE HCL ER 28 MG PO CP24: 28.0000 mg | ORAL_CAPSULE | Freq: Every day | ORAL | Status: DC

## 2016-04-09 NOTE — Patient Instructions (Signed)
Memory score is stable Continue Aricept and Namenda at current doses will refill for one year follow-up yearly next with Dr. Krista Blue

## 2016-04-09 NOTE — Progress Notes (Signed)
[facility]  PATIENT: [name] DOB: [birth year]   REASON FOR VISIT: Follow-up for memory loss HISTORY FROM: Patient and daughter    HISTORY OF PRESENT ILLNESS: HISTORY YYPansy L Sampley 82 year old right-handed Caucasian female, accompanied by her daughter and son-in-law and daughter at today's clinical visit. I saw her intitially in 04/2010, She is referred by her primary care Dr. Unice Cobble for electiagnostic study, because acute onset of left hand numbness and weakness 2 weeks ago. EMG nerve conduction study has demonstrated a mild right carpal tunnel syndrome, there is no evidence of left carpal tunnel syndrome, or left cervical radiculopathy. Patient has past medical history of coronary artery disease, status post bypass, and pacemaker placement for arrhythmia, hypertension, diabetes, hyperlipidemia, right knee replacement, baseline mild gait difficulty. she presenting with acute onset left hand weakness and numbness 2 weeks ago,. She was dining out with her family, suddenly things began to drop off of her left hand, she also noticed left-hand numbness,she couldn't feel things slipping out off her hands.  Since its onset she has slight improvement, but still has intermittent left hand numbness and weakness. She already had some baseline gait difficulty due to her right knee replacement, there is no significant worsening.she denied left facial involvement, no dysarthria. ECHO EF 45-50%, no thrombus, [place] cartoid A: no significant stenosis. CT head showed small vessel disease. No acute lesions. She had 14 years of education, retired as a Optician, dispensing at age 33, took care of her grandchildren afterwards, she now lives alone at her townhouse of more than 20 years.  She is referred back by Dr. Linna Darner in June 2013., she reported episodes of confusion, the most recent ones were 3 weeks ago, while driving on a famililar route, she got confused, but she was  able to find her way back, also reported difficulty with people's name,word finding difficulties over the past couple years, more obvious now, also had difficulties figuring out her checkbook  She has mild hard of hearing, left worse than right, no significant visual difficulty, repeat CAT scan showed no acute lesion, atrophy, small vessel disease, small right meningioma, no significant change, ultrasound carotid artery no significant stenosis Laboratory showed normal TSH, elevated creatinine 1.4, A1c 7.8 Last visit was in 06/16/2012: She continued to decline over the past few months, getting more confused, has difficulty managing her medications, increased gait difficulty over past couple weeks, with new left arm, leg weakness, reported most recent ultrasound of carotid arteries showed mild to moderate stenosis, she is taking Plavix, also bromocriptine 2.5 mg twice a day for reasons wasn't sure  UPDATE Apr 08 2015 [name], 82 year old female returns for follow-up. She has a history of memory loss. She still lives alone, memory is stable she has difficulty opening cans, she has helper to clean house, she has more trouble walking, 3 falls in the last year, currently receiving some physical therapy. She has been diagnosed with spinal stenosis. Daughter is worry about her, that she is more forgetful, difficulty handling her medications, sometimes using walker crossing the busy major road with a lot of traffic. She has mild worsening gait difficulty, bilateral knee pain, but she now has [employer]. Daughter would like for mother to come and live with her but the mother absolutely refuses and it does seem like the current living situation is steady. Daughter checks on mother several times a day UPDATE 05/18/2017CM [name], 82 year old female returns for follow-up. She has a history of memory loss which is  stable she currently resides with her daughter. She had mild worsening gait difficulty. Knee replacements   surgery in August of last year on the left. Appetite is good. She's had 2 episodes of confusion in the last year both  in the presence of urinary tract infections. She ambulates with a walker and falls. She has been diabetic for years most recent hemoglobin A1c 6.3. She occasionally has vivid dreams, denies any hallucinations or wandering behavior. She returns for reevaluation  REVIEW OF SYSTEMS: Full 14 system review of systems performed and notable only for those listed, all others are neg:  Constitutional: neg  Cardiovascular: Leg swelling Ear/Nose/Throat: neg  Skin: neg Eyes: neg Respiratory: neg Gastroitestinal: Urinary frequency  Hematology/Lymphatic: neg  Endocrine: neg Musculoskeletal:neg Allergy/Immunology: Allergies Neurological: Memory loss  Psychiatric: Depression and anxiety Sleep : neg   ALLERGIES: Allergies  Allergen Reactions  . Codeine Rash  . Robaxin [Methocarbamol] Other (See Comments)    Too strong     HOME MEDICATIONS: Outpatient Prescriptions Prior to Visit  Medication Sig Dispense Refill  . acarbose (PRECOSE) 50 MG tablet Take 1 tablet (50 mg total) by mouth 2 (two) times daily with a meal. 60 tablet 11  . acetaminophen (TYLENOL) 500 MG tablet Take 500 mg by mouth every 8 (eight) hours as needed for mild pain. Take 2 tab = 1,000 mg PO Q 8 hours PRN    . amitriptyline (ELAVIL) 25 MG tablet Take 25 mg by mouth at bedtime.    Marland Kitchen apixaban (ELIQUIS) 2.5 MG TABS tablet Take 1 tablet (2.5 mg total) by mouth 2 (two) times daily. 60 tablet 11  . atorvastatin (LIPITOR) 20 MG tablet TAKE 1 TABLET BY MOUTH EVERY DAY (TAKE IN PLACE OF PRAVASTATIN) 30 tablet 3  . bromocriptine (PARLODEL) 2.5 MG tablet TAKE 1 TABLET (2.5 MG TOTAL) BY MOUTH AT BEDTIME. 30 tablet 10  . Calcium-Vitamin D 600-200 MG-UNIT per tablet Take 1 tablet by mouth daily.    . colesevelam (WELCHOL) 625 MG tablet Take 3 tablets (1,875 mg total) by mouth 2 (two) times daily before a meal.    . diltiazem  (CARDIZEM) 30 MG tablet Take 1 tablet (30 mg total) by mouth every 6 (six) hours as needed. 120 tablet 3  . docusate sodium (COLACE) 100 MG capsule Take 100 mg by mouth 2 (two) times daily.    Marland Kitchen donepezil (ARICEPT) 10 MG tablet TAKE 1 TABLET (10 MG TOTAL) BY MOUTH AT BEDTIME. 90 tablet 0  . enalapril (VASOTEC) 20 MG tablet TAKE 1 TABLET BY MOUTH DAILY 90 tablet 0  . fish oil-omega-3 fatty acids 1000 MG capsule Take 1 g by mouth daily.    . INVOKANA 100 MG TABS tablet TAKE 1 TABLET BY MOUTH EVERY DAY 90 tablet 2  . levothyroxine (SYNTHROID, LEVOTHROID) 25 MCG tablet Take 1.5 tablets (37.5 mcg total) by mouth daily before breakfast. 45 tablet 0  . meclizine (ANTIVERT) 25 MG tablet Take 1 tablet (25 mg total) by mouth 3 (three) times daily as needed for dizziness. 20 tablet 0  . memantine (NAMENDA XR) 28 MG CP24 24 hr capsule Take 1 capsule (28 mg total) by mouth daily. 90 capsule 3  . metoprolol (TOPROL-XL) 200 MG 24 hr tablet TAKE 1 TABLET (200 MG TOTAL) BY MOUTH DAILY. 30 tablet 11  . MYRBETRIQ 25 MG TB24 tablet TAKE 1 TABLET (25 MG TOTAL) BY MOUTH DAILY. 90 tablet 1  . niacin 500 MG tablet Take 500 mg by mouth every evening.     Marland Kitchen  nitroGLYCERIN (NITROSTAT) 0.3 MG SL tablet Place 1 tablet (0.3 mg total) under the tongue every 5 (five) minutes as needed. For chest pain 90 tablet 3  . pantoprazole (PROTONIX) 40 MG tablet TAKE 1 TABLET BY MOUTH EVERY DAY 30 tablet 11  . repaglinide (PRANDIN) 2 MG tablet TAKE 1 TABLET (2 MG TOTAL) BY MOUTH 3 (THREE) TIMES DAILY BEFORE MEALS. 90 tablet 11  . sitaGLIPtin (JANUVIA) 100 MG tablet Take 50 mg by mouth daily. Take half tablet daily    . traMADol (ULTRAM) 50 MG tablet Take 1-2 tablets (50-100 mg total) by mouth every 6 (six) hours as needed for moderate pain. (Patient taking differently: Take 50 mg by mouth at bedtime. ) 80 tablet 0  . cephALEXin (KEFLEX) 500 MG capsule Take 1 capsule (500 mg total) by mouth 4 (four) times daily. (Patient not taking: Reported  on 04/09/2016) 30 capsule 0  . ciprofloxacin (CIPRO) 250 MG tablet Take 1 tablet (250 mg total) by mouth 2 (two) times daily. (Patient not taking: Reported on 04/09/2016) 10 tablet 0   Facility-Administered Medications Prior to Visit  Medication Dose Route Frequency Provider Last Rate Last Dose  . bupivacaine liposome (EXPAREL) 1.3 % injection 266 mg  20 mL Infiltration Once Ardeen Jourdain, PA-C        PAST MEDICAL HISTORY: Past Medical History  Diagnosis Date  . Sliding hiatal hernia   . Erosive esophagitis   . Peptic stricture of esophagus   . Diverticulosis   . Arthritis   . Hypothyroidism   . Diabetes mellitus     Type I  . Sick sinus syndrome [facility])     s/p Medtronic DDD pacemaker implanted; generator change 02/2012  . Hypertension   . Hyperlipidemia   . GERD (gastroesophageal reflux disease)   . Adenomatous colon polyp 07/2002  . Obesity   . Paroxysmal atrial fibrillation ([place]) 02/04/15    discovered on PPM remote interrogation 6/16  . Coronary artery disease     s/p CABG 2001 x 5  . CVA (cerebral vascular accident) [facility]) 2013 or 2014    Dr Evelena Leyden, Neurology  . Dementia   . History of swelling of feet     props up at night, circulations has been checked inlegs in past and was told it was ok    PAST SURGICAL HISTORY: Past Surgical History  Procedure Laterality Date  . Pacemaker placement  03/25/2000    by Dr Olevia Perches  . Tonsillectomy and adenoidectomy    . Appendectomy    . Cholecystectomy    . Total abdominal hysterectomy w/ bilateral salpingoophorectomy      Endometiosis  . Replacement total knee  2004    Right  . Colonoscopy w/ polypectomy  2003  . Cataract extraction  05/2005  . Coronary artery bypass graft  02/2000    5 vessel  . Rotator cuff repair Right 2009  . Permanent pacemaker generator change  03/18/2012    PPM gen change by Dr Rayann Heman  . Total knee arthroplasty Left 07/19/2015    Procedure: LEFT TOTAL KNEE ARTHROPLASTY;  Surgeon: Latanya Maudlin, MD;   Location: [facility];  Service: Orthopedics;  Laterality: Left;    FAMILY HISTORY: Family History  Problem Relation Age of Onset  . Colon cancer Mother 79  . Breast cancer Mother   . Transient ischemic attack Mother   . Cancer Mother   . Hypertension Mother   . Stroke Mother   . Diabetes Mother   . Colon cancer Brother 69  .  Cancer Brother   . Hypertension Brother   . Heart attack Brother   . Breast cancer Sister   . Cancer Sister   . Diabetes Sister   . Prostate cancer Brother   . Heart attack Brother   . Heart disease      3 brothers had MI; 1 pre 67  . Hypertension Sister   . Heart attack Daughter   . Diabetes Maternal Aunt     SOCIAL HISTORY: Social History   Social History  . Marital Status: Widowed    Spouse Name: N/A  . Number of Children: 1  . Years of Education: N/A   Occupational History  . Retired    Social History Main Topics  . Smoking status: Former Research scientist (life sciences)  . Smokeless tobacco: Former Systems developer    Quit date: 11/23/1981     Comment: quit 28+ yrs ago (as of 2012)  . Alcohol Use: No  . Drug Use: No  . Sexual Activity: No   Other Topics Concern  . Not on file   Social History Narrative   Patient lives at home alone. Patient's daughter was with her [name] ) [phone]   Retired.   Education college   Right handed.   Caffeine one cup of coffee daily.   Marland Kitchen     PHYSICAL EXAM  Filed Vitals:   04/09/16 1559  BP: 116/54  Pulse: 72  Height: 5\' 5"  (1.651 m)  Weight: 204 lb 3.2 oz (92.625 kg)   Body mass index is 33.98 kg/(m^2). Generalized: In no acute distress, obese female Neck: Supple, no carotid bruits  Musculoskeletal: No deformity  Neurological examination  Mentation: Alert oriented to time, place, history taking, and causual conversation, Mini-Mental Status Examination 26/30 last was  27 out of 30 1 year ago. , she missed 1 out of 3 recalls, mild difficulty copy design Missed  questions in orientation. AFT 8. Clock drawing 3 out  of 4.  Cranial nerve II-XII: Pupils were equal round reactive to light. Extraocular movements were full. Visual field were full on confrontational test. Bilateral fundi were sharp. Facial sensation and strength were normal. Hearing was intact to finger rubbing bilaterally. Uvula tongue midline. Head turning and shoulder shrug and were normal and symmetric.Tongue protrusion into cheek strength was normal. Motor: Normal tone, bulk and strength. Sensory: Intact to fine touch  Coordination: Normal finger to nose, heel-to-shin bilaterally there was no truncal ataxia Gait: Rising up from seated position pushing on chair. Ambulated 30 feet in the hall with a walker short steppage, no difficulty with turns  DIAGNOSTIC DATA (LABS, IMAGING, TESTING) - I reviewed patient records, labs, notes, testing and imaging myself where available.  Lab Results  Component Value Date   WBC 6.2 03/28/2016   HGB 11.2* 03/28/2016   HCT 35.8* 03/28/2016   MCV 89.5 03/28/2016   PLT 136* 03/28/2016      Component Value Date/Time   NA 140 03/28/2016 1227   K 3.8 03/28/2016 1227   CL 106 03/28/2016 1227   CO2 24 03/28/2016 1227   GLUCOSE 163* 03/28/2016 1227   BUN 20 03/28/2016 1227   CREATININE 1.60* 03/28/2016 1227   CREATININE 1.49* 03/16/2016 1639   CALCIUM 9.6 03/28/2016 1227   PROT 6.2* 03/28/2016 1227   ALBUMIN 3.5 03/28/2016 1227   AST 40 03/28/2016 1227   ALT 26 03/28/2016 1227   ALKPHOS 34* 03/28/2016 1227   BILITOT 0.5 03/28/2016 1227   GFRNONAA 28* 03/28/2016 1227   GFRAA 32* 03/28/2016 1227  Lab Results  Component Value Date   HGBA1C 6.3 03/30/2016     ASSESSMENT AND PLAN  82 y.o. year old female  with past medical history of gradual onset memory loss. Memory score has been stable she has mild gait difficulty and just completed physical therapy. She has had left knee replacement since last seen.  PLAN Memory score is stable 26/30. Last 27/30. In May 2016 Continue Aricept at  current dose will refill for 3 months with 3 refills Continue Namenda will refill 3 months with 3 refills Follow-up yearly and when necessary next with Dr. Luan Pulling, [facility], [facility], [facility] [address] [place], [place] [address] [phone]

## 2016-04-10 NOTE — Progress Notes (Signed)
I have reviewed and agreed above plan. 

## 2016-04-15 ENCOUNTER — Encounter: Payer: Self-pay | Admitting: Cardiology

## 2016-04-22 ENCOUNTER — Other Ambulatory Visit: Payer: Self-pay | Admitting: Neurology

## 2016-04-23 ENCOUNTER — Other Ambulatory Visit: Payer: Self-pay | Admitting: Internal Medicine

## 2016-05-06 ENCOUNTER — Other Ambulatory Visit: Payer: Self-pay | Admitting: Internal Medicine

## 2016-05-17 ENCOUNTER — Other Ambulatory Visit: Payer: Self-pay | Admitting: Endocrinology

## 2016-05-21 ENCOUNTER — Other Ambulatory Visit: Payer: Self-pay | Admitting: Nurse Practitioner

## 2016-06-02 ENCOUNTER — Ambulatory Visit (INDEPENDENT_AMBULATORY_CARE_PROVIDER_SITE_OTHER): Payer: Medicare Other | Admitting: *Deleted

## 2016-06-02 ENCOUNTER — Telehealth: Payer: Self-pay | Admitting: Cardiology

## 2016-06-02 DIAGNOSIS — I495 Sick sinus syndrome: Secondary | ICD-10-CM | POA: Diagnosis not present

## 2016-06-02 NOTE — Telephone Encounter (Signed)
Confirmed remote transmission w/ pt daughter.   

## 2016-06-03 NOTE — Progress Notes (Signed)
Remote pacemaker transmission.   

## 2016-06-05 ENCOUNTER — Encounter: Payer: Self-pay | Admitting: Cardiology

## 2016-06-05 LAB — CUP PACEART REMOTE DEVICE CHECK
Brady Statistic AP VS Percent: 97 %
Brady Statistic AS VP Percent: 0 %
Date Time Interrogation Session: 20170711185149
Implantable Lead Implant Date: 20010503
Implantable Lead Implant Date: 20010503
Implantable Lead Location: 753859
Implantable Lead Model: 5076
Implantable Lead Model: 5076
Lead Channel Impedance Value: 815 Ohm
Lead Channel Pacing Threshold Amplitude: 1.25 V
Lead Channel Pacing Threshold Pulse Width: 0.4 ms
Lead Channel Pacing Threshold Pulse Width: 0.4 ms
Lead Channel Sensing Intrinsic Amplitude: 11.2 mV
Lead Channel Setting Sensing Sensitivity: 5.6 mV
MDC IDC LEAD LOCATION: 753860
MDC IDC MSMT BATTERY IMPEDANCE: 255 Ohm
MDC IDC MSMT BATTERY REMAINING LONGEVITY: 108 mo
MDC IDC MSMT BATTERY VOLTAGE: 2.78 V
MDC IDC MSMT LEADCHNL RA IMPEDANCE VALUE: 389 Ohm
MDC IDC MSMT LEADCHNL RA PACING THRESHOLD AMPLITUDE: 0.625 V
MDC IDC SET LEADCHNL RA PACING AMPLITUDE: 2 V
MDC IDC SET LEADCHNL RV PACING AMPLITUDE: 2.5 V
MDC IDC SET LEADCHNL RV PACING PULSEWIDTH: 0.46 ms
MDC IDC STAT BRADY AP VP PERCENT: 2 %
MDC IDC STAT BRADY AS VS PERCENT: 1 %

## 2016-06-06 ENCOUNTER — Other Ambulatory Visit: Payer: Self-pay | Admitting: Internal Medicine

## 2016-06-09 ENCOUNTER — Other Ambulatory Visit: Payer: Self-pay | Admitting: Endocrinology

## 2016-06-09 ENCOUNTER — Ambulatory Visit (INDEPENDENT_AMBULATORY_CARE_PROVIDER_SITE_OTHER): Payer: Medicare Other | Admitting: *Deleted

## 2016-06-09 DIAGNOSIS — I4891 Unspecified atrial fibrillation: Secondary | ICD-10-CM

## 2016-06-09 LAB — CBC
HCT: 37.4 % (ref 35.0–45.0)
Hemoglobin: 12.1 g/dL (ref 11.7–15.5)
MCH: 28.8 pg (ref 27.0–33.0)
MCHC: 32.4 g/dL (ref 32.0–36.0)
MCV: 89 fL (ref 80.0–100.0)
MPV: 10.9 fL (ref 7.5–12.5)
PLATELETS: 158 10*3/uL (ref 140–400)
RBC: 4.2 MIL/uL (ref 3.80–5.10)
RDW: 14 % (ref 11.0–15.0)
WBC: 7.9 10*3/uL (ref 3.8–10.8)

## 2016-06-09 NOTE — Progress Notes (Signed)
Pt was started on Eliquis for Afib on 05/20/15.    Reviewed patients medication list.  Pt is not currently on any combined P-gp and strong CYP3A4 inhibitors/inducers (ketoconazole, traconazole, ritonavir, carbamazepine, phenytoin, rifampin, St. John's wort).  Reviewed labs.  SCr 1.69, Weight 92.3Kg, Age 7yrs.  Dose appropriate based on specified criteria.   Hgb and HCT 12.1/37.4 Reviewed lab results with Dr Hedy Camara, Sherian Rein D and she instructed me to send notice to Dr Quay Burow about SCr that is trending up. Eliquis dosage will remain unchanged. Pt can have labs redraw when she see Cardiology in October of this year per Dr Hedy Camara and follow with them as per their recommendations. Called daughter, [name] and gave above information, she states pt is due to see Dr Quay Burow on 06/29/16 anyway thus told her to follow any instructions she makes but continue Eliquis 2.5mg  Q12hrs.   A full discussion of the nature of anticoagulants has been carried out.  A benefit/risk analysis has been presented to the patient, so that they understand the justification for choosing anticoagulation with Eliquis at this time.  The need for compliance is stressed.  Pt is aware to take the medication twice daily.  Side effects of potential bleeding are discussed, including unusual colored urine or stools, coughing up blood or coffee ground emesis, nose bleeds or serious fall or head trauma.  Discussed signs and symptoms of stroke. The patient should avoid any OTC items containing aspirin or ibuprofen.  Avoid alcohol consumption.   Call if any signs of abnormal bleeding.  Discussed financial obligations and resolved any difficulty in obtaining medication.

## 2016-06-10 LAB — BASIC METABOLIC PANEL
BUN: 24 mg/dL (ref 7–25)
CHLORIDE: 106 mmol/L (ref 98–110)
CO2: 25 mmol/L (ref 20–31)
CREATININE: 1.69 mg/dL — AB (ref 0.60–0.88)
Calcium: 9.6 mg/dL (ref 8.6–10.4)
Glucose, Bld: 141 mg/dL — ABNORMAL HIGH (ref 65–99)
POTASSIUM: 4.8 mmol/L (ref 3.5–5.3)
Sodium: 140 mmol/L (ref 135–146)

## 2016-06-11 ENCOUNTER — Telehealth: Payer: Self-pay | Admitting: Internal Medicine

## 2016-06-11 NOTE — Telephone Encounter (Signed)
[facility] - Client [place] [facility] Patient Name: TYRIAH VALLIERE DOB: [birth year] Initial Comment Caller states her mother is heavy set and has developed chaffing in crease stomach. Had it previously and it went away. It has spread to leg creases as well. Says it looks like a yeast infection and she is on antibiotic - keflex. Nurse Assessment Nurse: Martyn Ehrich RN, Felicia Date/Time (Eastern Time): 06/11/2016 4:24:51 PM Confirm and document reason for call. If symptomatic, describe symptoms. You must click the next button to save text entered. ---PT has been on keflex for infection in cuticle of R pinky a week prior to Sat and that is pretty much cleared up. Now she has some red in creases of legs - not in groin. It is in crease of abdomen. They have been washing and keeping it dry and put ammen's powder on it. No fever Has the patient traveled out of the country within the last 30 days? ---No Does the patient have any new or worsening symptoms? ---Yes Will a triage be completed? ---Yes Related visit to physician within the last 2 weeks? ---No Does the PT have any chronic conditions? (i.e. diabetes, asthma, etc.) ---Yes List chronic conditions. ---DM, heart patient and had surgery, thyroid issue and HTN Is this a behavioral health or substance abuse call? ---No Guidelines Guideline Title Affirmed Question Affirmed Notes Jock Itch Rash is painful to touch Final Disposition User See Physician within 24 Hours [place], RN, [place] Ice Comments also h/o dementia part of the red area is weltly. Referrals REFERRED TO PCP OFFICE Disagree/Comply: Comply Call Id: [id number]

## 2016-06-12 ENCOUNTER — Encounter: Payer: Self-pay | Admitting: Internal Medicine

## 2016-06-12 ENCOUNTER — Ambulatory Visit (INDEPENDENT_AMBULATORY_CARE_PROVIDER_SITE_OTHER): Payer: Medicare Other | Admitting: Internal Medicine

## 2016-06-12 VITALS — BP 124/68 | HR 80 | Temp 97.4°F | Resp 20 | Wt 204.0 lb

## 2016-06-12 DIAGNOSIS — L03011 Cellulitis of right finger: Secondary | ICD-10-CM

## 2016-06-12 DIAGNOSIS — I10 Essential (primary) hypertension: Secondary | ICD-10-CM

## 2016-06-12 DIAGNOSIS — L304 Erythema intertrigo: Secondary | ICD-10-CM | POA: Insufficient documentation

## 2016-06-12 MED ORDER — NYSTATIN 100000 UNIT/GM EX CREA: 1.0000 "application " | TOPICAL_CREAM | Freq: Two times a day (BID) | CUTANEOUS | Status: DC

## 2016-06-12 MED ORDER — FLUCONAZOLE 100 MG PO TABS: 100.0000 mg | ORAL_TABLET | Freq: Every day | ORAL | Status: DC

## 2016-06-12 MED ORDER — NYSTATIN 100000 UNIT/GM EX POWD: CUTANEOUS | Status: DC

## 2016-06-12 NOTE — Progress Notes (Signed)
Subjective:    Patient ID: [name], female    DOB: 07/20/1925, 82 y.o.   MRN: [id number]  HPI  Here to f/u with daughter, has been living with daughter for one yr, s/p left knee TKR;  Here with c/o rash to abd fo rseveral wks,  Seemed to have a rash similar a few wks ago, better with "Ammons powder" and seemed better, but in the past wk rash is back. Currently incidentally on cephalexin since July 8 for rright fifth finger paronychia, now much improved, also treated with epsons salts, ruptured one wk ago, also using neosporin. Rash is to lower abd and groin area, wears depends and pads, has occas incontinence though actually better than a few months ago.  Tends to be sedentary, sitting mostly, and wearing tighter clothing, and now wearing less tight dresses.  No fever, or pain, just "uncofomtable stinging irritatoin." Not better with sponge baths so far. But may not have gotten dry under the skin folds. Pt denies chest pain, increased sob or doe, wheezing, orthopnea, PND, increased LE swelling, palpitations, dizziness or syncope. Past Medical History  Diagnosis Date  . Sliding hiatal hernia   . Erosive esophagitis   . Peptic stricture of esophagus   . Diverticulosis   . Arthritis   . Hypothyroidism   . Diabetes mellitus     Type I  . Sick sinus syndrome [facility])     s/p Medtronic DDD pacemaker implanted; generator change 02/2012  . Hypertension   . Hyperlipidemia   . GERD (gastroesophageal reflux disease)   . Adenomatous colon polyp 07/2002  . Obesity   . Paroxysmal atrial fibrillation ([place]) 02/04/15    discovered on PPM remote interrogation 6/16  . Coronary artery disease     s/p CABG 2001 x 5  . CVA (cerebral vascular accident) [facility]) 2013 or 2014    Dr Evelena Leyden, Neurology  . Dementia   . History of swelling of feet     props up at night, circulations has been checked inlegs in past and was told it was ok   Past Surgical History  Procedure Laterality Date  . Pacemaker placement   03/25/2000    by Dr Olevia Perches  . Tonsillectomy and adenoidectomy    . Appendectomy    . Cholecystectomy    . Total abdominal hysterectomy w/ bilateral salpingoophorectomy      Endometiosis  . Replacement total knee  2004    Right  . Colonoscopy w/ polypectomy  2003  . Cataract extraction  05/2005  . Coronary artery bypass graft  02/2000    5 vessel  . Rotator cuff repair Right 2009  . Permanent pacemaker generator change  03/18/2012    PPM gen change by Dr Rayann Heman  . Total knee arthroplasty Left 07/19/2015    Procedure: LEFT TOTAL KNEE ARTHROPLASTY;  Surgeon: Latanya Maudlin, MD;  Location: [facility];  Service: Orthopedics;  Laterality: Left;    reports that she has quit smoking. She quit smokeless tobacco use about 34 years ago. She reports that she does not drink alcohol or use illicit drugs. family history includes Breast cancer in her mother and sister; Cancer in her brother, mother, and sister; Colon cancer (age of onset: 19) in her brother; Colon cancer (age of onset: 67) in her mother; Diabetes in her maternal aunt, mother, and sister; Heart attack in her brother, brother, and daughter; Hypertension in her brother, mother, and sister; Prostate cancer in her brother; Stroke in her mother; Transient ischemic attack  in her mother. Allergies  Allergen Reactions  . Codeine Rash  . Robaxin [Methocarbamol] Other (See Comments)    Too strong    Current Outpatient Prescriptions on File Prior to Visit  Medication Sig Dispense Refill  . acarbose (PRECOSE) 50 MG tablet Take 1 tablet (50 mg total) by mouth 2 (two) times daily with a meal. 60 tablet 11  . acetaminophen (TYLENOL) 500 MG tablet Take 500 mg by mouth every 8 (eight) hours as needed for mild pain. Take 2 tab = 1,000 mg PO Q 8 hours PRN    . amitriptyline (ELAVIL) 25 MG tablet Take 25 mg by mouth at bedtime.    Marland Kitchen atorvastatin (LIPITOR) 20 MG tablet TAKE 1 TABLET BY MOUTH EVERY DAY (TAKE IN PLACE OF PRAVASTATIN) 30 tablet 0  . bromocriptine  (PARLODEL) 2.5 MG tablet TAKE 1 TABLET (2.5 MG TOTAL) BY MOUTH AT BEDTIME. 30 tablet 10  . Calcium-Vitamin D 600-200 MG-UNIT per tablet Take 1 tablet by mouth daily.    . colesevelam (WELCHOL) 625 MG tablet Take 1,250 mg by mouth 3 (three) times daily.     Marland Kitchen diltiazem (CARDIZEM) 30 MG tablet Take 1 tablet (30 mg total) by mouth every 6 (six) hours as needed. 120 tablet 3  . docusate sodium (COLACE) 100 MG capsule Take 100 mg by mouth 2 (two) times daily.    Marland Kitchen donepezil (ARICEPT) 10 MG tablet TAKE 1 TABLET (10 MG TOTAL) BY MOUTH AT BEDTIME. 90 tablet 3  . ELIQUIS 2.5 MG TABS tablet TAKE 1 TABLET (2.5 MG TOTAL) BY MOUTH 2 (TWO) TIMES DAILY. 60 tablet 3  . enalapril (VASOTEC) 20 MG tablet TAKE 1 TABLET BY MOUTH DAILY 90 tablet 0  . fish oil-omega-3 fatty acids 1000 MG capsule Take 1 g by mouth daily.    . INVOKANA 100 MG TABS tablet TAKE 1 TABLET BY MOUTH EVERY DAY 90 tablet 1  . levothyroxine (SYNTHROID, LEVOTHROID) 25 MCG tablet Take 1.5 tablets daily before breakfast. ---Pt Needs labs before further refills. 135 tablet 0  . meclizine (ANTIVERT) 25 MG tablet Take 1 tablet (25 mg total) by mouth 3 (three) times daily as needed for dizziness. 20 tablet 0  . memantine (NAMENDA XR) 28 MG CP24 24 hr capsule Take 1 capsule (28 mg total) by mouth daily. 90 capsule 3  . metoprolol (TOPROL-XL) 200 MG 24 hr tablet TAKE 1 TABLET (200 MG TOTAL) BY MOUTH DAILY. 30 tablet 11  . MYRBETRIQ 25 MG TB24 tablet TAKE 1 TABLET (25 MG TOTAL) BY MOUTH DAILY. 90 tablet 1  . niacin 500 MG tablet Take 500 mg by mouth every evening.     . nitroGLYCERIN (NITROSTAT) 0.3 MG SL tablet Place 1 tablet (0.3 mg total) under the tongue every 5 (five) minutes as needed. For chest pain 90 tablet 3  . pantoprazole (PROTONIX) 40 MG tablet TAKE 1 TABLET BY MOUTH EVERY DAY 30 tablet 11  . repaglinide (PRANDIN) 2 MG tablet TAKE 1 TABLET (2 MG TOTAL) BY MOUTH 3 (THREE) TIMES DAILY BEFORE MEALS. 90 tablet 11  . sitaGLIPtin (JANUVIA) 100 MG  tablet Take 50 mg by mouth daily. Take half tablet daily    . traMADol (ULTRAM) 50 MG tablet Take 1-2 tablets (50-100 mg total) by mouth every 6 (six) hours as needed for moderate pain. (Patient taking differently: Take 50 mg by mouth at bedtime. ) 80 tablet 0   Current Facility-Administered Medications on File Prior to Visit  Medication Dose Route Frequency Provider Last  Rate Last Dose  . bupivacaine liposome (EXPAREL) 1.3 % injection 266 mg  20 mL Infiltration Once [employer], PA-C       Review of Systems  Constitutional: Negative for unusual diaphoresis or night sweats HENT: Negative for ear swelling or discharge Eyes: Negative for worsening visual haziness  Respiratory: Negative for choking and stridor.   Gastrointestinal: Negative for distension or worsening eructation Genitourinary: Negative for retention or change in urine volume.  Musculoskeletal: Negative for other MSK pain or swelling Skin: Negative for color change and worsening wound Neurological: Negative for tremors and numbness other than noted  Psychiatric/Behavioral: Negative for decreased concentration or agitation other than above       Objective:   Physical Exam BP 124/68 mmHg  Pulse 80  Temp(Src) 97.4 F (36.3 C) (Oral)  Resp 20  Wt 204 lb (92.534 kg)  SpO2 91% VS noted,  Constitutional: Pt appears in no apparent distress HENT: Head: NCAT.  Right Ear: External ear normal.  Left Ear: External ear normal.  Eyes: . Pupils are equal, round, and reactive to light. Conjunctivae and EOM are normal Neck: Normal range of motion. Neck supple.  Cardiovascular: Normal rate and regular rhythm.   Pulmonary/Chest: Effort normal and breath sounds without rales or wheezing.  Abd:  Soft, NT, ND, + BS Neurological: Pt is alert. Not confused , motor grossly intact Skin: Skin is warm. no LE edema; right fifth finger with slight erythema/swelling without tender or drainage; pt has smaller panniculus to abdomen with marked  macerated areas bilat to bother lateral right and left sides and well as extensive involvement of the bilat groin folds as well; surprisingly nontender, non warm, no fluctuance, drainage Psychiatric: Pt behavior is normal. No agitation.     Assessment & Plan:

## 2016-06-12 NOTE — Assessment & Plan Note (Signed)
Much improved, ok to finish the cephalexin as planned,  to f/u any worsening symptoms or concerns

## 2016-06-12 NOTE — Telephone Encounter (Signed)
I sent nystatin cream to her pof.  She has used the powder in the past as well - either should help - it is just a matter of what they find more helpful.  This will help if it is a fungal rash - apply twice a day.  If no improvement she should be seen.

## 2016-06-12 NOTE — Progress Notes (Signed)
Pre visit review using our clinic review tool, if applicable. No additional management support is needed unless otherwise documented below in the visit note. 

## 2016-06-12 NOTE — Assessment & Plan Note (Signed)
Rather dramatic in appearance to the bilat lower abd below the lower abd fat fold but also involving the bilat groin folds as well; for diflucan 100 qd x 7 days, as well as nystatin powder topical in addition, then prn as well, to avoid warm/moist conditions in future

## 2016-06-12 NOTE — Patient Instructions (Signed)
Please take all new medication as prescribed - the pills, and the powder if needed  Please continue all other medications as before, and refills have been done if requested.  Please have the pharmacy call with any other refills you may need.  Please keep your appointments with your specialists as you may have planned

## 2016-06-12 NOTE — Telephone Encounter (Signed)
Please advise 

## 2016-06-12 NOTE — Assessment & Plan Note (Signed)
stable overall by history and exam, recent data reviewed with pt, and pt to continue medical treatment as before,  to f/u any worsening symptoms or concerns BP Readings from Last 3 Encounters:  06/12/16 124/68  04/09/16 116/54  03/31/16 106/60

## 2016-06-15 NOTE — Telephone Encounter (Signed)
Spoke with pts daughter to inform.  

## 2016-06-17 ENCOUNTER — Telehealth: Payer: Self-pay | Admitting: Internal Medicine

## 2016-06-17 NOTE — Telephone Encounter (Signed)
Daughter states patient seen Dr. Jenny Reichmann for a really bad yeast infection.  States that Dr. Quay Burow called in nystatin for her.  States patient has one more use tonight but will be out tomorrow and that the pharmacy is not refilling.  States there was three refills on medication.  Would like to know how to get this refilled?  States area was larger than what thought and had to use more of the cream.

## 2016-06-17 NOTE — Telephone Encounter (Signed)
[facility] states pt has to wait until 06/20/16 for medication to be covered. Pts daughter is aware.

## 2016-06-19 ENCOUNTER — Other Ambulatory Visit: Payer: Self-pay | Admitting: Endocrinology

## 2016-06-19 ENCOUNTER — Encounter: Payer: Self-pay | Admitting: Cardiology

## 2016-06-22 ENCOUNTER — Encounter: Payer: Self-pay | Admitting: Internal Medicine

## 2016-06-22 ENCOUNTER — Ambulatory Visit (INDEPENDENT_AMBULATORY_CARE_PROVIDER_SITE_OTHER): Payer: Medicare Other | Admitting: Internal Medicine

## 2016-06-22 VITALS — BP 128/62 | HR 67 | Temp 97.5°F | Resp 16 | Wt 205.0 lb

## 2016-06-22 DIAGNOSIS — L304 Erythema intertrigo: Secondary | ICD-10-CM

## 2016-06-22 DIAGNOSIS — F329 Major depressive disorder, single episode, unspecified: Secondary | ICD-10-CM

## 2016-06-22 DIAGNOSIS — F418 Other specified anxiety disorders: Secondary | ICD-10-CM

## 2016-06-22 DIAGNOSIS — E785 Hyperlipidemia, unspecified: Secondary | ICD-10-CM | POA: Diagnosis not present

## 2016-06-22 DIAGNOSIS — R413 Other amnesia: Secondary | ICD-10-CM

## 2016-06-22 DIAGNOSIS — E1122 Type 2 diabetes mellitus with diabetic chronic kidney disease: Secondary | ICD-10-CM

## 2016-06-22 DIAGNOSIS — K219 Gastro-esophageal reflux disease without esophagitis: Secondary | ICD-10-CM

## 2016-06-22 DIAGNOSIS — F419 Anxiety disorder, unspecified: Secondary | ICD-10-CM

## 2016-06-22 DIAGNOSIS — I10 Essential (primary) hypertension: Secondary | ICD-10-CM | POA: Diagnosis not present

## 2016-06-22 DIAGNOSIS — E039 Hypothyroidism, unspecified: Secondary | ICD-10-CM

## 2016-06-22 DIAGNOSIS — F32A Depression, unspecified: Secondary | ICD-10-CM | POA: Insufficient documentation

## 2016-06-22 DIAGNOSIS — N183 Chronic kidney disease, stage 3 (moderate): Secondary | ICD-10-CM

## 2016-06-22 DIAGNOSIS — L03011 Cellulitis of right finger: Secondary | ICD-10-CM

## 2016-06-22 MED ORDER — SERTRALINE HCL 25 MG PO TABS: 25.0000 mg | ORAL_TABLET | Freq: Every day | ORAL | 5 refills | Status: DC

## 2016-06-22 MED ORDER — NYSTATIN 100000 UNIT/GM EX CREA: 1.0000 "application " | TOPICAL_CREAM | Freq: Two times a day (BID) | CUTANEOUS | 3 refills | Status: DC

## 2016-06-22 MED ORDER — AMOXICILLIN-POT CLAVULANATE 875-125 MG PO TABS: 1.0000 | ORAL_TABLET | Freq: Two times a day (BID) | ORAL | 0 refills | Status: DC

## 2016-06-22 NOTE — Progress Notes (Signed)
Pre visit review using our clinic review tool, if applicable. No additional management support is needed unless otherwise documented below in the visit note. 

## 2016-06-22 NOTE — Assessment & Plan Note (Signed)
Following with neuro On namenda and aricept

## 2016-06-22 NOTE — Assessment & Plan Note (Signed)
Resolved with keflex, but recurred after she was picking at it rx for augementin given (kelfex caused some stomach upset) Her daughter will continue to soak finger and apply neosporin -- if it does not improve or worsens she will start the antibiotic, but we will try to avoid it since it caused severe candidiasis

## 2016-06-22 NOTE — Assessment & Plan Note (Signed)
Following with endo - management per Dr Loanne Drilling

## 2016-06-22 NOTE — Assessment & Plan Note (Signed)
She would like to start something to help Already on elavil - has been on that for a while - I  Am concerned about increasing it - I do not want to make her too drowsy and increase her risk of falls Stop tramadol Will start low dose sertraline 25 mg nightly  Follow up in 4-6 weeks, sooner if needed

## 2016-06-22 NOTE — Patient Instructions (Addendum)
Use the antibiotic if needed for the finger infection.   Call if the rash does not improve with the cream and powder.    Medications reviewed and updated.  Changes include starting sertraline for depression and anxiety.   Your prescription(s) have been submitted to your pharmacy. Please take as directed and contact our office if you believe you are having problem(s) with the medication(s).  Please followup in 6 weeks

## 2016-06-22 NOTE — Assessment & Plan Note (Signed)
Management per Dr Loanne Drilling Last a1c 6.3%

## 2016-06-22 NOTE — Progress Notes (Signed)
Subjective:    Patient ID: [name], female    DOB: 03-Feb-1925, 82 y.o.   MRN: [id number]  HPI She is here for follow up.  Right 5th finger paronychia: She did finish the antibiocs and it did clear up.  This morning her daughter saw her picking at it and it looks infected again.  She does have some discharge today.  She denies pain.  She did take kelfex 500 mg four times a day for about one week.  Yeast infection, intertrigo:  She developed a yeast infection after taking the anitbiotics.  She was prescribed dilfucan daily for 7 days and it helped, but she still has the rash.  The rash was severe.  She did try nystatin cream and powder and it did help.   Anxiety, depression:  Her daughter feels she is depressed.  She has a lot going on and since losing her own home and having to live with her daughter she has had some depression.     Hypertension: She is taking her medication daily. She is compliant with a low sodium diet.  She denies chest pain, palpitations, shortness of breath and regular headaches. She is not exercising regularly.    Diabetes: She is following with endocrine.  She is taking her medication daily as prescribed. She is compliant with a diabetic diet. She is not exercising regularly. Her most recent a1c is 6.3%.  Hyperlipidemia: She is taking her medication daily. She is compliant with a low fat/cholesterol diet.   Hypothyroidism:  She follows with endocrine.  She is taking her medication daily.  She denies any recent changes in energy or weight that are unexplained.   GERD:  She is taking her medication daily as prescribed.  She denies any GERD symptoms and feels her GERD is well controlled.   CAD, s/p CABG:  She is following with cardiology.  She denies chest pain.  She does have chronic edema, which is unchanged.   Medications and allergies reviewed with patient and updated if appropriate.  Patient Active Problem List   Diagnosis Date Noted  . Intertrigo  06/12/2016  . Paronychia of fifth finger of right hand 06/12/2016  . History of total knee arthroplasty 07/19/2015  . At risk for sleep apnea 07/17/2015  . Memory loss 04/05/2014  . Chronic renal insufficiency, stage II (mild) 03/21/2013  . Encounter for long-term (current) use of other medications 07/29/2012  . Diabetes ([place]) 04/28/2012  . CVA (cerebral vascular accident) ([place])   . EDEMA- LOCALIZED 01/06/2011  . Chronic systolic heart failure ([place]) 12/26/2010  . GERD 04/30/2010  . TRANSIENT ISCHEMIC ATTACKS, HX OF 04/30/2010  . DYSPEPSIA 04/14/2010  . Generalized weakness 03/19/2010  . CONSTIPATION 02/13/2010  . ANEMIA, MILD 01/14/2010  . Hyperlipidemia 09/16/2008  . HYPERTENSION, BENIGN 09/16/2008  . CAD, AUTOLOGOUS BYPASS GRAFT 09/16/2008  . SICK SINUS/ TACHY-BRADY SYNDROME 09/16/2008  . PACEMAKER, PERMANENT 09/16/2008  . Coronary atherosclerosis 10/25/2007  . Hypothyroidism 06/14/2007  . COLONIC POLYPS, HX OF 03/23/2007    Current Outpatient Prescriptions on File Prior to Visit  Medication Sig Dispense Refill  . acarbose (PRECOSE) 50 MG tablet Take 1 tablet (50 mg total) by mouth 2 (two) times daily with a meal. 60 tablet 11  . acetaminophen (TYLENOL) 500 MG tablet Take 500 mg by mouth every 8 (eight) hours as needed for mild pain. Take 2 tab = 1,000 mg PO Q 8 hours PRN    . amitriptyline (ELAVIL) 25 MG tablet Take 25  mg by mouth at bedtime.    Marland Kitchen atorvastatin (LIPITOR) 20 MG tablet TAKE 1 TABLET BY MOUTH EVERY DAY (TAKE IN PLACE OF PRAVASTATIN) 30 tablet 0  . bromocriptine (PARLODEL) 2.5 MG tablet TAKE 1 TABLET BY MOUTH AT BEDTIME 30 tablet 10  . Calcium-Vitamin D 600-200 MG-UNIT per tablet Take 1 tablet by mouth daily.    . colesevelam (WELCHOL) 625 MG tablet Take 1,250 mg by mouth 3 (three) times daily.     Marland Kitchen diltiazem (CARDIZEM) 30 MG tablet Take 1 tablet (30 mg total) by mouth every 6 (six) hours as needed. 120 tablet 3  . docusate sodium (COLACE) 100 MG capsule Take 100  mg by mouth 2 (two) times daily.    Marland Kitchen donepezil (ARICEPT) 10 MG tablet TAKE 1 TABLET (10 MG TOTAL) BY MOUTH AT BEDTIME. 90 tablet 3  . ELIQUIS 2.5 MG TABS tablet TAKE 1 TABLET (2.5 MG TOTAL) BY MOUTH 2 (TWO) TIMES DAILY. 60 tablet 3  . enalapril (VASOTEC) 20 MG tablet TAKE 1 TABLET BY MOUTH DAILY 90 tablet 0  . fish oil-omega-3 fatty acids 1000 MG capsule Take 1 g by mouth daily.    . fluconazole (DIFLUCAN) 100 MG tablet Take 1 tablet (100 mg total) by mouth daily. 7 tablet 0  . INVOKANA 100 MG TABS tablet TAKE 1 TABLET BY MOUTH EVERY DAY 90 tablet 1  . levothyroxine (SYNTHROID, LEVOTHROID) 25 MCG tablet Take 1.5 tablets daily before breakfast. ---Pt Needs labs before further refills. 135 tablet 0  . meclizine (ANTIVERT) 25 MG tablet Take 1 tablet (25 mg total) by mouth 3 (three) times daily as needed for dizziness. 20 tablet 0  . memantine (NAMENDA XR) 28 MG CP24 24 hr capsule Take 1 capsule (28 mg total) by mouth daily. 90 capsule 3  . metoprolol (TOPROL-XL) 200 MG 24 hr tablet TAKE 1 TABLET (200 MG TOTAL) BY MOUTH DAILY. 30 tablet 11  . MYRBETRIQ 25 MG TB24 tablet TAKE 1 TABLET (25 MG TOTAL) BY MOUTH DAILY. 90 tablet 1  . niacin 500 MG tablet Take 500 mg by mouth every evening.     . nitroGLYCERIN (NITROSTAT) 0.3 MG SL tablet Place 1 tablet (0.3 mg total) under the tongue every 5 (five) minutes as needed. For chest pain 90 tablet 3  . nystatin (MYCOSTATIN/NYSTOP) powder Use as directed twice per day as needed 45 g 5  . nystatin cream (MYCOSTATIN) Apply 1 application topically 2 (two) times daily. To affected areas 30 g 3  . pantoprazole (PROTONIX) 40 MG tablet TAKE 1 TABLET BY MOUTH EVERY DAY 30 tablet 11  . repaglinide (PRANDIN) 2 MG tablet TAKE 1 TABLET (2 MG TOTAL) BY MOUTH 3 (THREE) TIMES DAILY BEFORE MEALS. 90 tablet 11  . sitaGLIPtin (JANUVIA) 100 MG tablet Take 50 mg by mouth daily. Take half tablet daily    . traMADol (ULTRAM) 50 MG tablet Take 1-2 tablets (50-100 mg total) by mouth  every 6 (six) hours as needed for moderate pain. (Patient taking differently: Take 50 mg by mouth at bedtime. ) 80 tablet 0   Current Facility-Administered Medications on File Prior to Visit  Medication Dose Route Frequency Provider Last Rate Last Dose  . bupivacaine liposome (EXPAREL) 1.3 % injection 266 mg  20 mL Infiltration Once Ardeen Jourdain, PA-C        Past Medical History:  Diagnosis Date  . Adenomatous colon polyp 07/2002  . Arthritis   . Coronary artery disease    s/p CABG 2001 x  5  . CVA (cerebral vascular accident) [facility]) 2013 or 2014   Dr Evelena Leyden, Neurology  . Dementia   . Diabetes mellitus    Type I  . Diverticulosis   . Erosive esophagitis   . GERD (gastroesophageal reflux disease)   . History of swelling of feet    props up at night, circulations has been checked inlegs in past and was told it was ok  . Hyperlipidemia   . Hypertension   . Hypothyroidism   . Obesity   . Paroxysmal atrial fibrillation ([place]) 02/04/15   discovered on PPM remote interrogation 6/16  . Peptic stricture of esophagus   . Sick sinus syndrome [facility])    s/p Medtronic DDD pacemaker implanted; generator change 02/2012  . Sliding hiatal hernia     Past Surgical History:  Procedure Laterality Date  . APPENDECTOMY    . CATARACT EXTRACTION  05/2005  . CHOLECYSTECTOMY    . COLONOSCOPY W/ POLYPECTOMY  2003  . CORONARY ARTERY BYPASS GRAFT  02/2000   5 vessel  . PACEMAKER PLACEMENT  03/25/2000   by Dr Olevia Perches  . PERMANENT PACEMAKER GENERATOR CHANGE  03/18/2012   PPM gen change by Dr Rayann Heman  . REPLACEMENT TOTAL KNEE  2004   Right  . ROTATOR CUFF REPAIR Right 2009  . TONSILLECTOMY AND ADENOIDECTOMY    . TOTAL ABDOMINAL HYSTERECTOMY W/ BILATERAL SALPINGOOPHORECTOMY     Endometiosis  . TOTAL KNEE ARTHROPLASTY Left 07/19/2015   Procedure: LEFT TOTAL KNEE ARTHROPLASTY;  Surgeon: Latanya Maudlin, MD;  Location: [facility];  Service: Orthopedics;  Laterality: Left;    Social History   Social History  .  Marital status: Widowed    Spouse name: N/A  . Number of children: 1  . Years of education: N/A   Occupational History  . Retired Retired   Social History Main Topics  . Smoking status: Former Research scientist (life sciences)  . Smokeless tobacco: Former Systems developer    Quit date: 11/23/1981     Comment: quit 28+ yrs ago (as of 2012)  . Alcohol use No  . Drug use: No  . Sexual activity: No   Other Topics Concern  . Not on file   Social History Narrative   Patient lives at home alone. Patient's daughter was with her [name] ) [phone]   Retired.   Education college   Right handed.   Caffeine one cup of coffee daily.   .    Family History  Problem Relation Age of Onset  . Colon cancer Mother 63  . Breast cancer Mother   . Transient ischemic attack Mother   . Cancer Mother   . Hypertension Mother   . Stroke Mother   . Diabetes Mother   . Colon cancer Brother 18  . Cancer Brother   . Hypertension Brother   . Heart attack Brother   . Breast cancer Sister   . Cancer Sister   . Diabetes Sister   . Prostate cancer Brother   . Heart attack Brother   . Heart disease      3 brothers had MI; 1 pre 6  . Hypertension Sister   . Heart attack Daughter   . Diabetes Maternal Aunt     Review of Systems  Constitutional: Negative for appetite change and fever.  HENT: Positive for postnasal drip.   Respiratory: Positive for cough (PND). Negative for shortness of breath and wheezing.   Cardiovascular: Positive for leg swelling (chronic, stable). Negative for chest pain and palpitations.  Gastrointestinal:  Negative for abdominal pain, constipation and diarrhea.  Skin: Positive for rash.  Neurological: Positive for dizziness (occasional) and light-headedness (occasional). Negative for headaches.  Psychiatric/Behavioral: Positive for dysphoric mood. The patient is nervous/anxious.        Objective:   Vitals:   06/22/16 1608  BP: 128/62  Pulse: 67  Resp: 16  Temp: 97.5 F (36.4 C)   Filed  Weights   06/22/16 1608  Weight: 205 lb (93 kg)   Body mass index is 34.11 kg/m.   Physical Exam Constitutional: Appears well-developed and well-nourished. No distress.  HENT:  Head: Normocephalic and atraumatic.  Neck: Neck supple. No tracheal deviation present. No thyromegaly present.  Cardiovascular: Normal rate, regular rhythm and normal heart sounds.   No murmur heard.  No carotid bruit  Pulmonary/Chest: Effort normal and breath sounds normal. No respiratory distress. No has no wheezes. No rales.  Musculoskeletal: 2+ b/l LE edema.  Lymphadenopathy: No cervical adenopathy.  Skin: 5th right finger paronychia associated with erythema, no active discharge, non-tender, no warmth; erythema under panus and in groin b/l;    Psychiatric: depressed mood and affect. Behavior is normal.         Assessment & Plan:   See Problem List for Assessment and Plan of chronic medical problems.   Follow up in 6 months

## 2016-06-22 NOTE — Assessment & Plan Note (Signed)
Taking niacin, lipitor, wellchol and omega Advised her to discuss with cardio if all of those were necessary

## 2016-06-22 NOTE — Assessment & Plan Note (Signed)
BP well controlled Current regimen effective and well tolerated Continue current medications at current doses  

## 2016-06-22 NOTE — Assessment & Plan Note (Signed)
Improved with oral diflucan, but still residual rash Nystatin cream or powder Will do another round of oral diflucan if needed

## 2016-06-22 NOTE — Assessment & Plan Note (Signed)
GERD controlled Continue daily medication  

## 2016-06-29 ENCOUNTER — Ambulatory Visit: Payer: Medicare Other | Admitting: Internal Medicine

## 2016-07-01 ENCOUNTER — Other Ambulatory Visit: Payer: Self-pay | Admitting: Podiatry

## 2016-07-12 IMAGING — CT CT HEAD W/O CM
1 series · 16 of 30 positions shown, 20 images · non-contrast
Comparison: Head CT 04/20/2012

CLINICAL DATA: Dizziness since yesterday.  Increased confusion.

EXAM:
CT HEAD WITHOUT CONTRAST
TECHNIQUE: Contiguous axial images were obtained from the base of the skull
through the vertex without intravenous contrast.

[Series 2: head 4.8 h37s · axial · 0.45mm/px · z∈[+1304,+1439]mm · 16 of 31 slices shown, 20 images]
[im 2/31  brain]
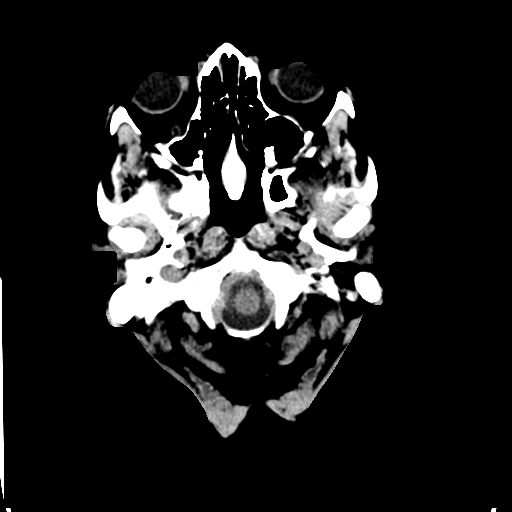
[im 2/31  bone]
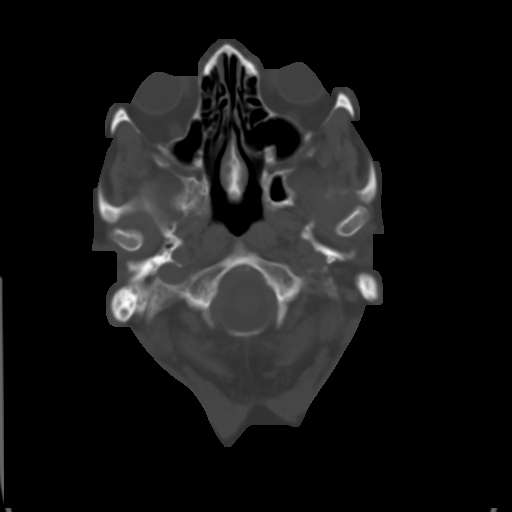
[im 4/31  brain]
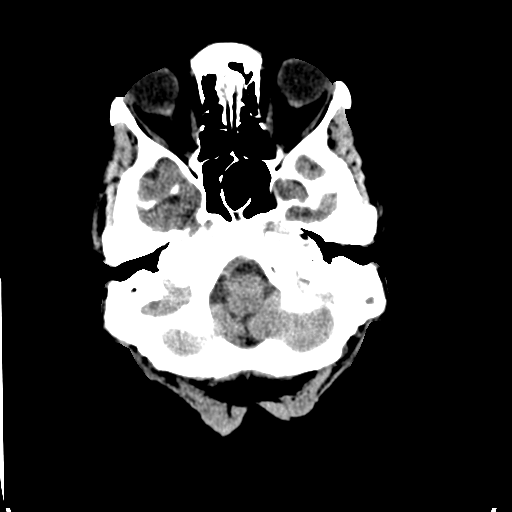
[im 6/31  brain]
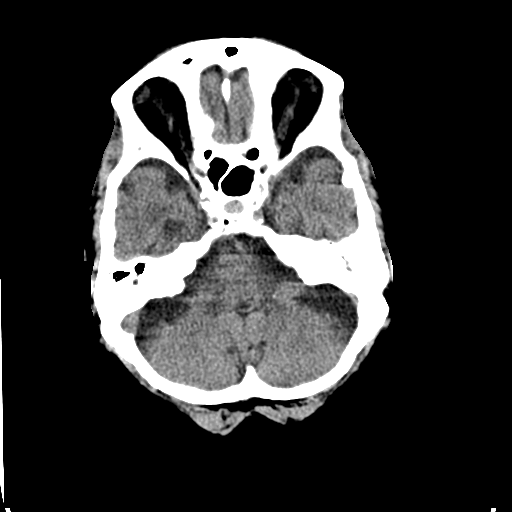
[im 8/31  brain]
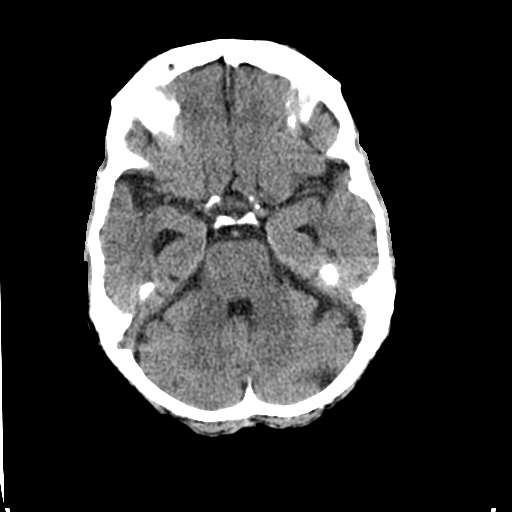
[im 9/31  brain]
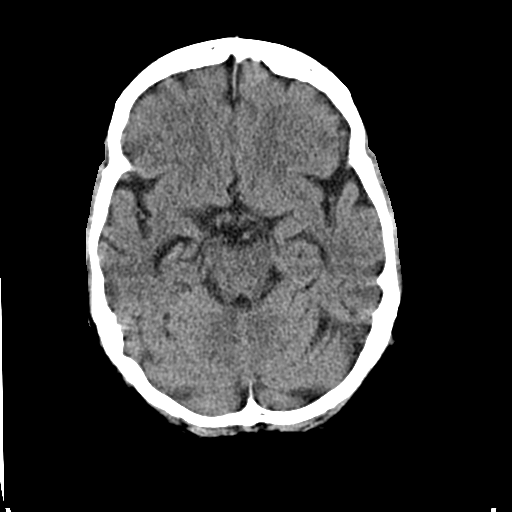
[im 9/31  bone]
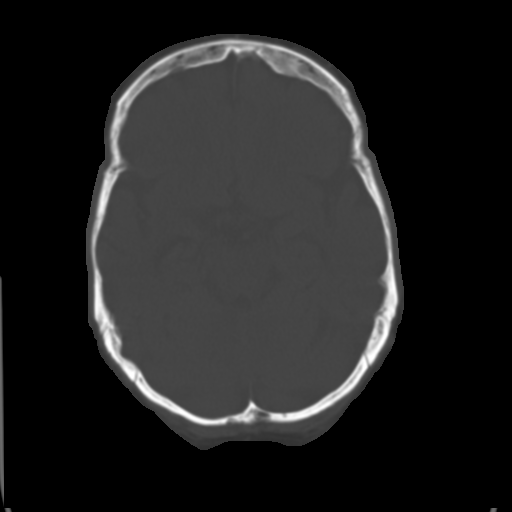
[im 11/31  brain]
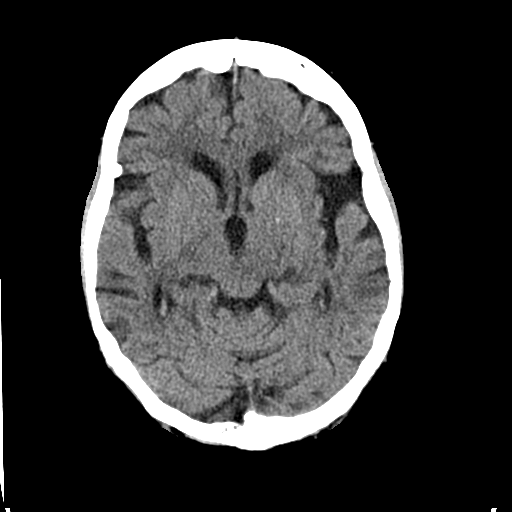
[im 13/31  brain]
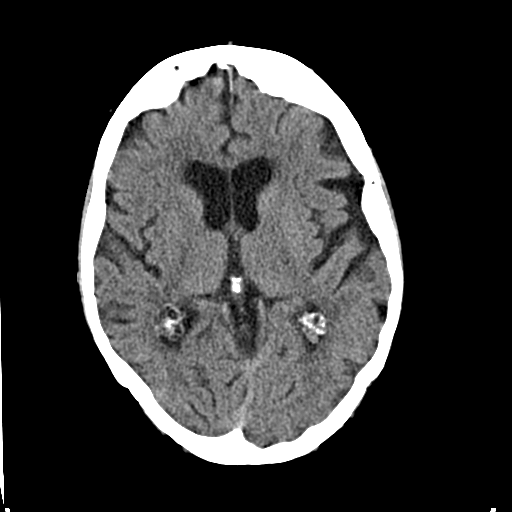
[im 15/31  brain]
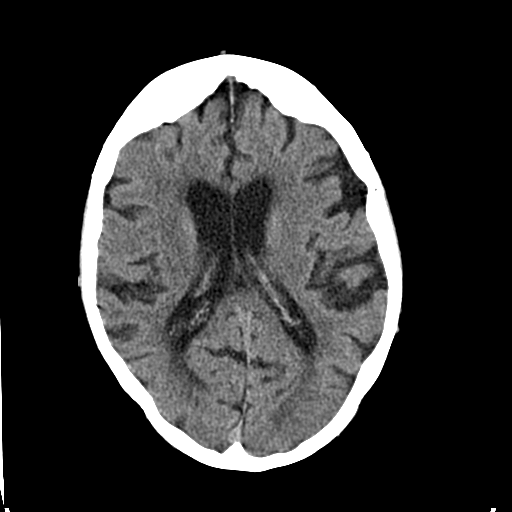
[im 16/31  brain]
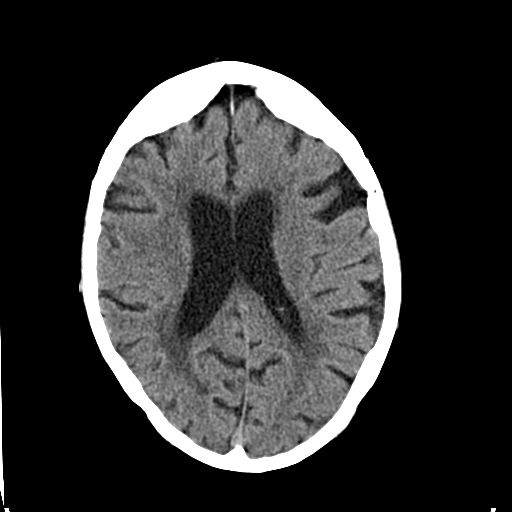
[im 16/31  bone]
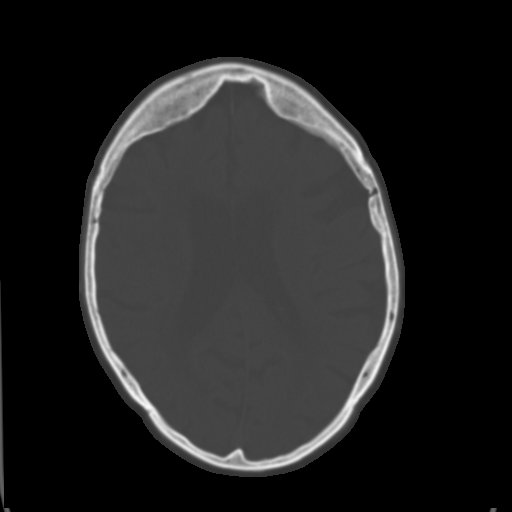
[im 18/31  brain]
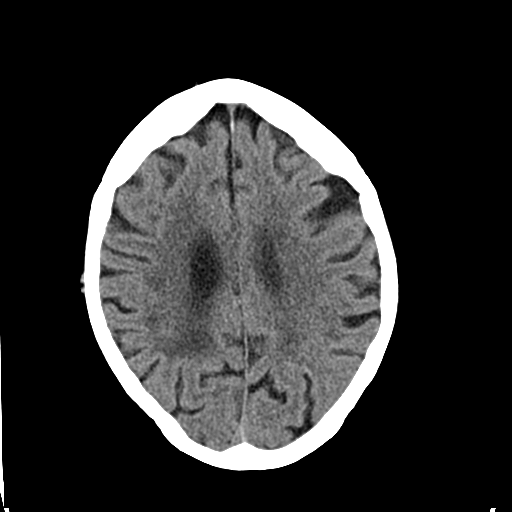
[im 20/31  brain]
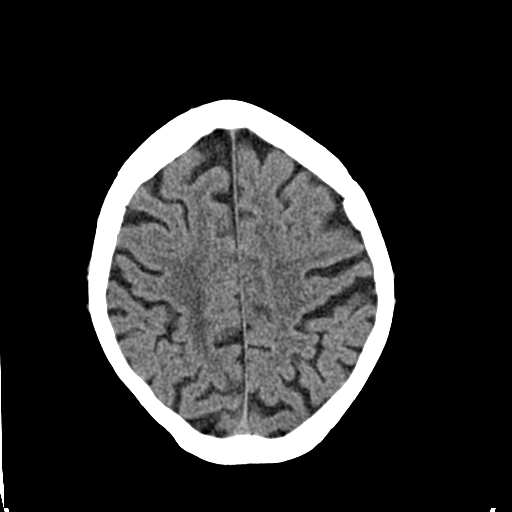
[im 22/31  brain]
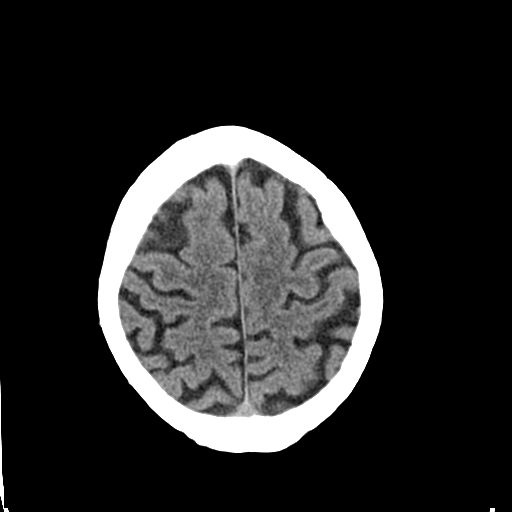
[im 23/31  brain]
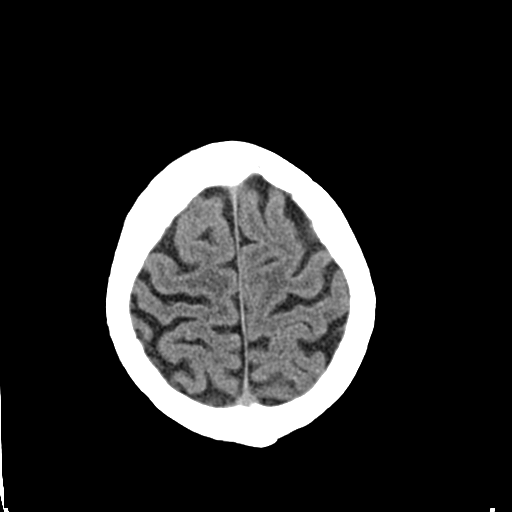
[im 23/31  bone]
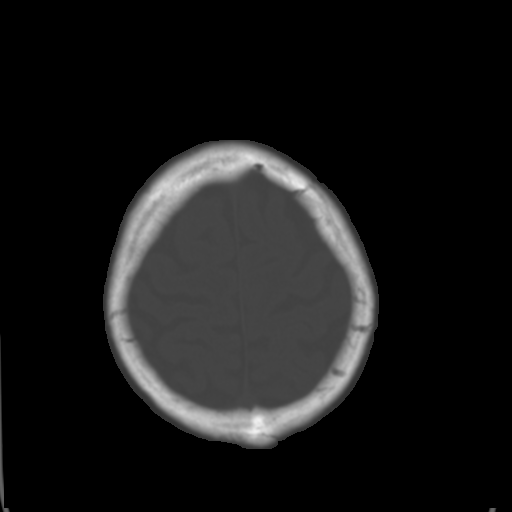
[im 25/31  brain]
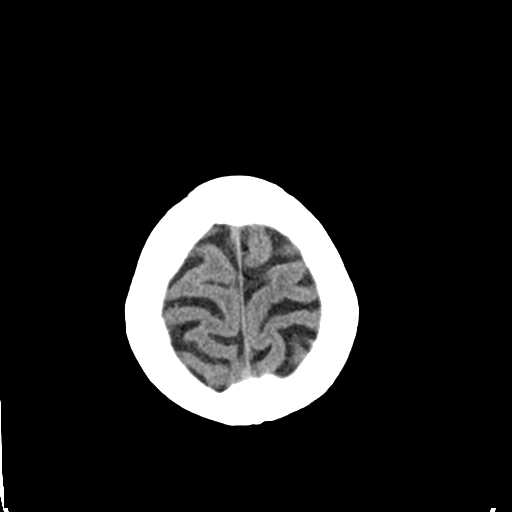
[im 27/31  brain]
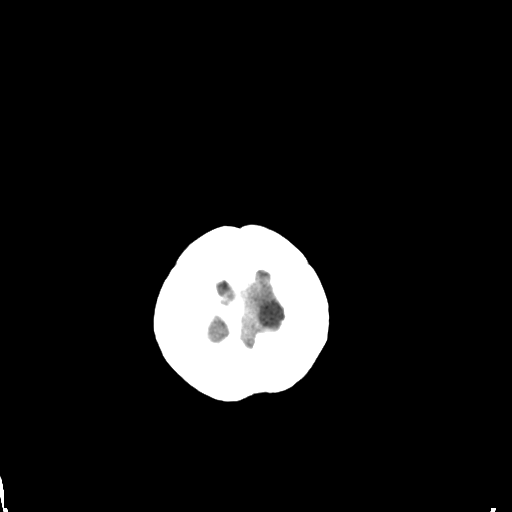
[im 29/31  brain]
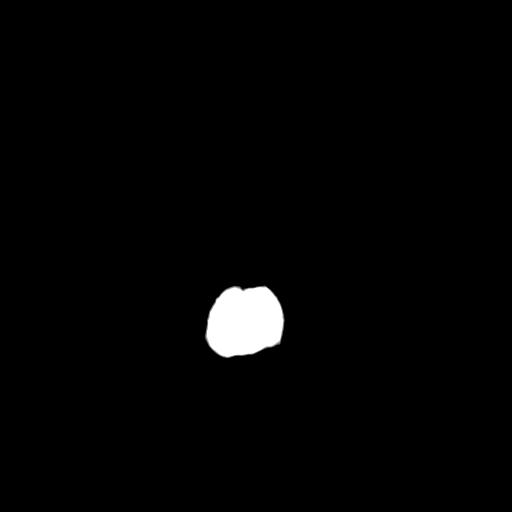

[16 of 30 positions shown; findings below may reference images not displayed]

FINDINGS: No acute intracranial hemorrhage. No focal mass lesion. No CT
evidence of acute infarction. No midline shift or mass effect. No
hydrocephalus. Basilar cisterns are patent.

There is generalized cortical atrophy unchanged. Periventricular
white matter and hypodensities again demonstrated. Extra-axial 7 mm
calcific density along the RIGHT frontal bone unchanged from prior
likely represents small meningioma.

Stable opacification mastoid air cells pre.
IMPRESSION: 1. No acute intracranial findings.
2. Stable atrophy and microvascular disease.
3. Chronic fluid in the mastoid air cells.

## 2016-07-12 IMAGING — CR DG CHEST 2V
1 series · 1 of 1 positions shown · non-contrast
Comparison: 07/15/2015 chest radiograph

CLINICAL DATA: Confusion

EXAM:
CHEST  2 VIEW

[w chest lat]
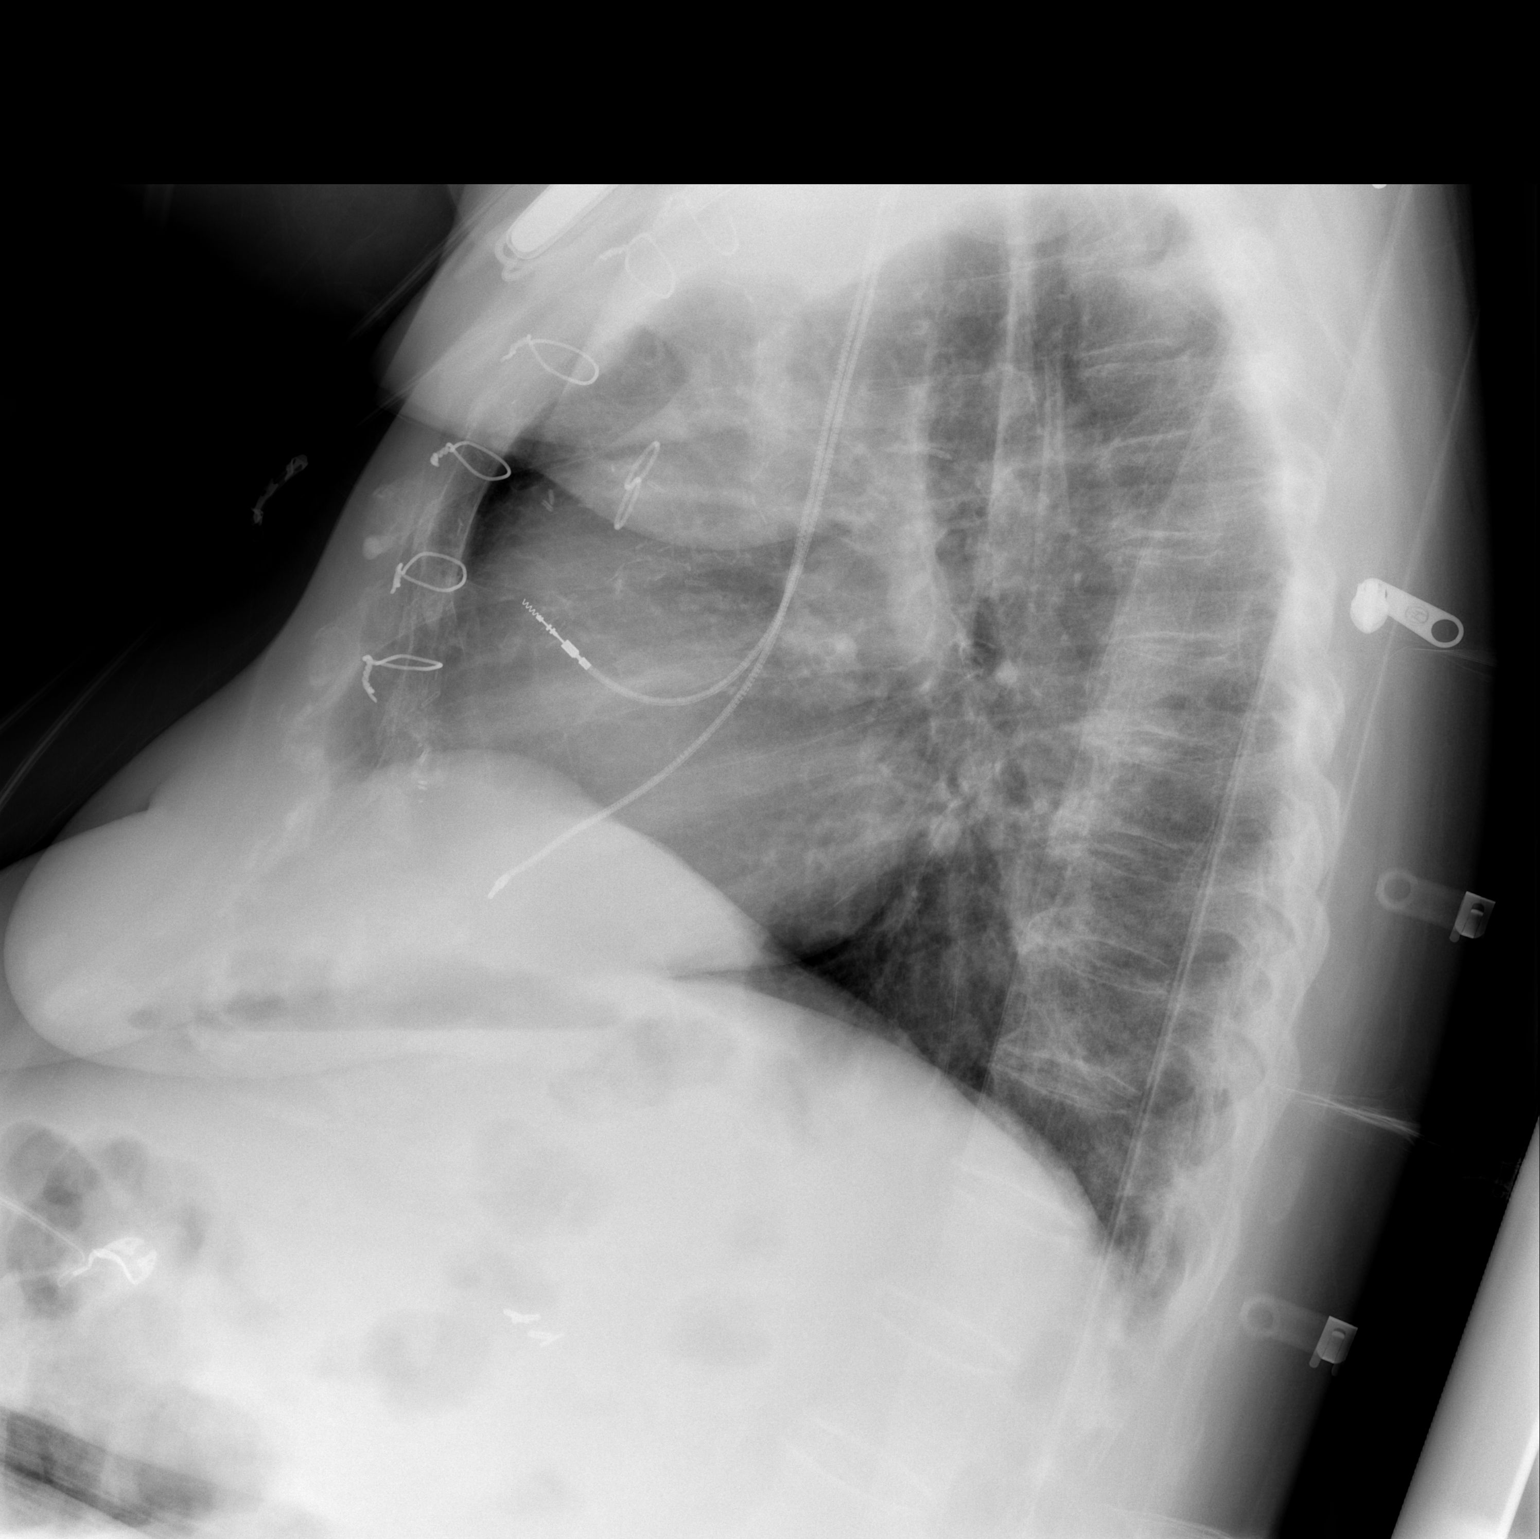

[1 of 1 positions shown; findings below may reference images not displayed]

FINDINGS: Median sternotomy wires, CABG clips and dual lead right subclavian
pacemaker are stable in configuration. Stable cardiomediastinal
silhouette with mild cardiomegaly. No pneumothorax. No pleural
effusion. Clear lungs, with no focal lung consolidation and no
pulmonary edema.
IMPRESSION: No active cardiopulmonary disease.

## 2016-07-19 ENCOUNTER — Other Ambulatory Visit: Payer: Self-pay | Admitting: Internal Medicine

## 2016-07-28 ENCOUNTER — Telehealth: Payer: Self-pay | Admitting: Internal Medicine

## 2016-07-28 DIAGNOSIS — I6350 Cerebral infarction due to unspecified occlusion or stenosis of unspecified cerebral artery: Secondary | ICD-10-CM

## 2016-07-28 DIAGNOSIS — I2510 Atherosclerotic heart disease of native coronary artery without angina pectoris: Secondary | ICD-10-CM

## 2016-07-28 DIAGNOSIS — R531 Weakness: Secondary | ICD-10-CM

## 2016-07-28 DIAGNOSIS — R413 Other amnesia: Secondary | ICD-10-CM

## 2016-07-28 NOTE — Telephone Encounter (Signed)
Patient Name: [name] DOB: May 15, 1925 Initial Comment Caller states 82 yr old mother lives with her; she injured her back trying to give mom a shower and now needs assistance in the home to help with patient care; how to go about getting assistance? Nurse Assessment Nurse: Ronnald Ramp, RN, Miranda Date/Time (Eastern Time): 07/28/2016 1:15:20 PM Confirm and document reason for call. If symptomatic, describe symptoms. You must click the next button to save text entered. ---Caller states she needs a home health aid to come in and help with bathing a couple of times a week. Has the patient traveled out of the country within the last 30 days? ---Not Applicable Does the patient have any new or worsening symptoms? ---No Please document clinical information provided and list any resource used. ---Told caller that I will notify the office to see about setting up home health. Guidelines Guideline Title Affirmed Question Affirmed Notes Final Disposition User Clinical Call Ronnald Ramp, RN, [employer]

## 2016-07-28 NOTE — Telephone Encounter (Signed)
It needs to be documented - we have an appt on the 12th. I will put in a referral now to see if they would be willing to evaluate her.

## 2016-07-29 ENCOUNTER — Other Ambulatory Visit: Payer: Self-pay | Admitting: Internal Medicine

## 2016-07-31 NOTE — Telephone Encounter (Signed)
Spoke with pts daughter to inform. Appt is being kept for 08/04/16

## 2016-08-04 ENCOUNTER — Encounter: Payer: Self-pay | Admitting: Internal Medicine

## 2016-08-04 ENCOUNTER — Ambulatory Visit (INDEPENDENT_AMBULATORY_CARE_PROVIDER_SITE_OTHER): Payer: Medicare Other | Admitting: Internal Medicine

## 2016-08-04 VITALS — BP 104/52 | HR 70 | Temp 97.5°F | Resp 16 | Wt 201.0 lb

## 2016-08-04 DIAGNOSIS — F418 Other specified anxiety disorders: Secondary | ICD-10-CM

## 2016-08-04 DIAGNOSIS — F419 Anxiety disorder, unspecified: Principal | ICD-10-CM

## 2016-08-04 DIAGNOSIS — E1122 Type 2 diabetes mellitus with diabetic chronic kidney disease: Secondary | ICD-10-CM

## 2016-08-04 DIAGNOSIS — N183 Chronic kidney disease, stage 3 unspecified: Secondary | ICD-10-CM

## 2016-08-04 DIAGNOSIS — E785 Hyperlipidemia, unspecified: Secondary | ICD-10-CM

## 2016-08-04 DIAGNOSIS — I10 Essential (primary) hypertension: Secondary | ICD-10-CM | POA: Diagnosis not present

## 2016-08-04 DIAGNOSIS — F329 Major depressive disorder, single episode, unspecified: Secondary | ICD-10-CM

## 2016-08-04 DIAGNOSIS — F32A Depression, unspecified: Secondary | ICD-10-CM

## 2016-08-04 NOTE — Patient Instructions (Addendum)
Stop the amitriptyline and start sertaline at night.       Please followup in 2-3 months

## 2016-08-04 NOTE — Assessment & Plan Note (Signed)
BP Readings from Last 3 Encounters:  08/04/16 (!) 104/52  06/22/16 128/62  06/12/16 124/68   BP well controlled Current regimen effective and well tolerated Continue current medications at current doses

## 2016-08-04 NOTE — Assessment & Plan Note (Signed)
Not controlled Has been on elavil for a long time and is no longer helping  - will discontinue Start sertraline 25 mg night - will titrate

## 2016-08-04 NOTE — Assessment & Plan Note (Signed)
Will take welchol in morning and afternoon so she can take the sertraline nightly Taking niacin, lipitor and omega

## 2016-08-04 NOTE — Assessment & Plan Note (Signed)
Following with dr Loanne Drilling Medication management per dr Loanne Drilling

## 2016-08-04 NOTE — Progress Notes (Signed)
Pre visit review using our clinic review tool, if applicable. No additional management support is needed unless otherwise documented below in the visit note. 

## 2016-08-04 NOTE — Progress Notes (Signed)
Subjective:    Patient ID: [name], female    DOB: 1924-12-10, 82 y.o.   MRN: [id number]  HPI The patient is here for follow up.  Her daughter is not able to pick up anything heavy and not able to help bathe her.   She wonders about help at home with bathing.  She is not able to bathe herself.  The difficulty is getting in and out of the bathtub.  She has spinal stenosis and arthritis that prevent her from being able to get in and out of the tub by herself. Revisions are being made to the bathroom and eventually a shower will be put in.    Depression, Anxiety: She is taking her elavil daily as prescribed. She has not started the sertraline.   She feels her anxiety and depression are not controlled.  She is upset she is having to liver with her daughter and can not live independently in her own home.  Diabetes: she is follow with endocrine.  She is taking her medication daily as prescribed. She is compliant with a diabetic diet.   Hypertension: She is taking her medication daily. She is compliant with a low sodium diet.  She denies chest pain, palpitations, shortness of breath and regular headaches.   Her chronic leg edema is stable.    Medications and allergies reviewed with patient and updated if appropriate.  Patient Active Problem List   Diagnosis Date Noted  . Anxiety and depression 06/22/2016  . Intertrigo 06/12/2016  . Paronychia of fifth finger of right hand 06/12/2016  . History of total knee arthroplasty 07/19/2015  . At risk for sleep apnea 07/17/2015  . Memory loss 04/05/2014  . Chronic renal insufficiency, stage II (mild) 03/21/2013  . Encounter for long-term (current) use of other medications 07/29/2012  . Diabetes ([place]) 04/28/2012  . CVA (cerebral vascular accident) ([place])   . EDEMA- LOCALIZED 01/06/2011  . Chronic systolic heart failure ([place]) 12/26/2010  . GERD 04/30/2010  . TRANSIENT ISCHEMIC ATTACKS, HX OF 04/30/2010  . DYSPEPSIA 04/14/2010  . Generalized  weakness 03/19/2010  . CONSTIPATION 02/13/2010  . ANEMIA, MILD 01/14/2010  . Hyperlipidemia 09/16/2008  . HYPERTENSION, BENIGN 09/16/2008  . CAD, AUTOLOGOUS BYPASS GRAFT 09/16/2008  . SICK SINUS/ TACHY-BRADY SYNDROME 09/16/2008  . PACEMAKER, PERMANENT 09/16/2008  . Coronary atherosclerosis 10/25/2007  . Hypothyroidism 06/14/2007  . COLONIC POLYPS, HX OF 03/23/2007    Current Outpatient Prescriptions on File Prior to Visit  Medication Sig Dispense Refill  . acarbose (PRECOSE) 50 MG tablet Take 1 tablet (50 mg total) by mouth 2 (two) times daily with a meal. 60 tablet 11  . acetaminophen (TYLENOL) 500 MG tablet Take 500 mg by mouth every 8 (eight) hours as needed for mild pain. Take 2 tab = 1,000 mg PO Q 8 hours PRN    . amitriptyline (ELAVIL) 25 MG tablet Take 25 mg by mouth at bedtime.    Marland Kitchen amoxicillin-clavulanate (AUGMENTIN) 875-125 MG tablet Take 1 tablet by mouth 2 (two) times daily. 20 tablet 0  . atorvastatin (LIPITOR) 20 MG tablet TAKE 1 TABLET BY MOUTH EVERY DAY (TO REPLACE PRAVASTATIN) 90 tablet 3  . bromocriptine (PARLODEL) 2.5 MG tablet TAKE 1 TABLET BY MOUTH AT BEDTIME 30 tablet 10  . Calcium-Vitamin D 600-200 MG-UNIT per tablet Take 1 tablet by mouth daily.    . colesevelam (WELCHOL) 625 MG tablet Take 1,250 mg by mouth 3 (three) times daily.     Marland Kitchen diltiazem (CARDIZEM) 30  MG tablet Take 1 tablet (30 mg total) by mouth every 6 (six) hours as needed. 120 tablet 3  . docusate sodium (COLACE) 100 MG capsule Take 100 mg by mouth 2 (two) times daily.    Marland Kitchen donepezil (ARICEPT) 10 MG tablet TAKE 1 TABLET (10 MG TOTAL) BY MOUTH AT BEDTIME. 90 tablet 3  . ELIQUIS 2.5 MG TABS tablet TAKE 1 TABLET (2.5 MG TOTAL) BY MOUTH 2 (TWO) TIMES DAILY. 60 tablet 3  . enalapril (VASOTEC) 20 MG tablet TAKE 1 TABLET BY MOUTH DAILY 90 tablet 0  . fish oil-omega-3 fatty acids 1000 MG capsule Take 1 g by mouth daily.    . INVOKANA 100 MG TABS tablet TAKE 1 TABLET BY MOUTH EVERY DAY 90 tablet 1  .  levothyroxine (SYNTHROID, LEVOTHROID) 25 MCG tablet Take 1.5 tablets daily before breakfast. ---Pt Needs labs before further refills. 135 tablet 0  . meclizine (ANTIVERT) 25 MG tablet Take 1 tablet (25 mg total) by mouth 3 (three) times daily as needed for dizziness. 20 tablet 0  . memantine (NAMENDA XR) 28 MG CP24 24 hr capsule Take 1 capsule (28 mg total) by mouth daily. 90 capsule 3  . metoprolol (TOPROL-XL) 200 MG 24 hr tablet TAKE 1 TABLET (200 MG TOTAL) BY MOUTH DAILY. 30 tablet 11  . MYRBETRIQ 25 MG TB24 tablet TAKE 1 TABLET (25 MG TOTAL) BY MOUTH DAILY. 90 tablet 1  . niacin 500 MG tablet Take 500 mg by mouth every evening.     . nitroGLYCERIN (NITROSTAT) 0.3 MG SL tablet Place 1 tablet (0.3 mg total) under the tongue every 5 (five) minutes as needed. For chest pain 90 tablet 3  . nystatin (MYCOSTATIN/NYSTOP) powder Use as directed twice per day as needed 45 g 5  . nystatin cream (MYCOSTATIN) Apply 1 application topically 2 (two) times daily. To affected areas 90 g 3  . pantoprazole (PROTONIX) 40 MG tablet TAKE 1 TABLET BY MOUTH EVERY DAY 30 tablet 11  . repaglinide (PRANDIN) 2 MG tablet TAKE 1 TABLET (2 MG TOTAL) BY MOUTH 3 (THREE) TIMES DAILY BEFORE MEALS. 90 tablet 11  . sitaGLIPtin (JANUVIA) 100 MG tablet Take 50 mg by mouth daily. Take half tablet daily    . sertraline (ZOLOFT) 25 MG tablet Take 1 tablet (25 mg total) by mouth at bedtime. (Patient not taking: Reported on 08/04/2016) 30 tablet 5   Current Facility-Administered Medications on File Prior to Visit  Medication Dose Route Frequency Provider Last Rate Last Dose  . bupivacaine liposome (EXPAREL) 1.3 % injection 266 mg  20 mL Infiltration Once Ardeen Jourdain, PA-C        Past Medical History:  Diagnosis Date  . Adenomatous colon polyp 07/2002  . Arthritis   . Coronary artery disease    s/p CABG 2001 x 5  . CVA (cerebral vascular accident) [facility]) 2013 or 2014   Dr Evelena Leyden, Neurology  . Dementia   . Diabetes mellitus    Type  I  . Diverticulosis   . Erosive esophagitis   . GERD (gastroesophageal reflux disease)   . History of swelling of feet    props up at night, circulations has been checked inlegs in past and was told it was ok  . Hyperlipidemia   . Hypertension   . Hypothyroidism   . Obesity   . Paroxysmal atrial fibrillation ([place]) 02/04/15   discovered on PPM remote interrogation 6/16  . Peptic stricture of esophagus   . Sick sinus syndrome ([place])  s/p Medtronic DDD pacemaker implanted; generator change 02/2012  . Sliding hiatal hernia     Past Surgical History:  Procedure Laterality Date  . APPENDECTOMY    . CATARACT EXTRACTION  05/2005  . CHOLECYSTECTOMY    . COLONOSCOPY W/ POLYPECTOMY  2003  . CORONARY ARTERY BYPASS GRAFT  02/2000   5 vessel  . PACEMAKER PLACEMENT  03/25/2000   by Dr Olevia Perches  . PERMANENT PACEMAKER GENERATOR CHANGE  03/18/2012   PPM gen change by Dr Rayann Heman  . REPLACEMENT TOTAL KNEE  2004   Right  . ROTATOR CUFF REPAIR Right 2009  . TONSILLECTOMY AND ADENOIDECTOMY    . TOTAL ABDOMINAL HYSTERECTOMY W/ BILATERAL SALPINGOOPHORECTOMY     Endometiosis  . TOTAL KNEE ARTHROPLASTY Left 07/19/2015   Procedure: LEFT TOTAL KNEE ARTHROPLASTY;  Surgeon: Latanya Maudlin, MD;  Location: [facility];  Service: Orthopedics;  Laterality: Left;    Social History   Social History  . Marital status: Widowed    Spouse name: N/A  . Number of children: 1  . Years of education: N/A   Occupational History  . Retired Retired   Social History Main Topics  . Smoking status: Former Research scientist (life sciences)  . Smokeless tobacco: Former Systems developer    Quit date: 11/23/1981     Comment: quit 28+ yrs ago (as of 2012)  . Alcohol use No  . Drug use: No  . Sexual activity: No   Other Topics Concern  . Not on file   Social History Narrative   Patient lives at home alone. Patient's daughter was with her [name] ) [phone]   Retired.   Education college   Right handed.   Caffeine one cup of coffee daily.   .     Family History  Problem Relation Age of Onset  . Colon cancer Mother 63  . Breast cancer Mother   . Transient ischemic attack Mother   . Cancer Mother   . Hypertension Mother   . Stroke Mother   . Diabetes Mother   . Colon cancer Brother 55  . Cancer Brother   . Hypertension Brother   . Heart attack Brother   . Breast cancer Sister   . Cancer Sister   . Diabetes Sister   . Prostate cancer Brother   . Heart attack Brother   . Heart disease      3 brothers had MI; 1 pre 30  . Hypertension Sister   . Heart attack Daughter   . Diabetes Maternal Aunt     Review of Systems  Constitutional: Negative for fever.  HENT: Positive for postnasal drip and rhinorrhea.   Respiratory: Positive for cough (occ with PND). Negative for shortness of breath and wheezing.   Cardiovascular: Positive for leg swelling (chronic - no change). Negative for chest pain and palpitations.  Gastrointestinal: Negative for abdominal pain.  Neurological: Positive for dizziness (occasional, transient). Negative for headaches.  Psychiatric/Behavioral: Positive for dysphoric mood. Negative for sleep disturbance.       Objective:   Vitals:   08/04/16 1553  BP: (!) 104/52  Pulse: 70  Resp: 16  Temp: 97.5 F (36.4 C)   Filed Weights   08/04/16 1553  Weight: 201 lb (91.2 kg)   Body mass index is 33.45 kg/m.   Physical Exam    Constitutional: Appears well-developed and well-nourished. No distress.  HENT:  Head: Normocephalic and atraumatic.  Neck: Neck supple. No tracheal deviation present. No thyromegaly present.  Cardiovascular: Normal rate, regular rhythm and normal  heart sounds.   2/6 systolic murmur heard. No carotid bruit  Pulmonary/Chest: Effort normal and breath sounds normal. No respiratory distress. No has no wheezes. No rales.  Musculoskeletal: moderate b/l LE edema.  Lymphadenopathy: No cervical adenopathy.  Skin: Skin is warm and dry. Not diaphoretic.  Psychiatric: depressed  mood and affect. Behavior is normal.     Assessment & Plan:    See Problem List for Assessment and Plan of chronic medical problems.

## 2016-08-05 ENCOUNTER — Other Ambulatory Visit: Payer: Self-pay | Admitting: Internal Medicine

## 2016-08-06 ENCOUNTER — Other Ambulatory Visit: Payer: Self-pay | Admitting: Internal Medicine

## 2016-08-24 ENCOUNTER — Encounter: Payer: Self-pay | Admitting: Endocrinology

## 2016-08-24 ENCOUNTER — Ambulatory Visit (INDEPENDENT_AMBULATORY_CARE_PROVIDER_SITE_OTHER): Payer: Medicare Other | Admitting: Endocrinology

## 2016-08-24 VITALS — BP 128/64 | HR 73 | Ht 65.0 in | Wt 203.0 lb

## 2016-08-24 DIAGNOSIS — N183 Chronic kidney disease, stage 3 unspecified: Secondary | ICD-10-CM

## 2016-08-24 DIAGNOSIS — E1122 Type 2 diabetes mellitus with diabetic chronic kidney disease: Secondary | ICD-10-CM | POA: Diagnosis not present

## 2016-08-24 LAB — POCT GLYCOSYLATED HEMOGLOBIN (HGB A1C): HEMOGLOBIN A1C: 6.4

## 2016-08-24 MED ORDER — REPAGLINIDE 1 MG PO TABS: 1.0000 mg | ORAL_TABLET | Freq: Three times a day (TID) | ORAL | 3 refills | Status: DC

## 2016-08-24 NOTE — Patient Instructions (Addendum)
check your blood sugar once a day.  vary the time of day when you check, between before the 3 meals, and at bedtime.  also check if you have symptoms of your blood sugar being too high or too low.  please keep a record of the readings and bring it to your next appointment here.  please call us sooner if your blood sugar goes below 70, or if you have a lot of readings over 200.   Please reduce the repaglinide to 1 mg, 3 times a day (just before each meal).    Please come back for a follow-up appointment in 4-5 months.

## 2016-08-24 NOTE — Progress Notes (Signed)
Subjective:    Patient ID: [name], female    DOB: 09-09-1925, 82 y.o.   MRN: [id number]  HPI Pt returns for f/u of diabetes mellitus: DM type: 2 Dx'ed: 99991111 Complications: sensory neuropathy, CVA, renal insuff, and CAD.   Therapy: 6 oral meds.   GDM: never. DKA: never. Severe hypoglycemia: never.   Pancreatitis: never.   Other: she is not on metformin, due to CHF, she is not on actos, due to edema; she is off amaryl, due to renal insuff.  Pt says she'll take insulin if necessary;  She lives dtr [name]).   Interval history:  pt states she feels better in general, since recent TIA and UTI.  no cbg record, but states cbg's are well-controlled Past Medical History:  Diagnosis Date  . Adenomatous colon polyp 07/2002  . Arthritis   . Coronary artery disease    s/p CABG 2001 x 5  . CVA (cerebral vascular accident) [facility]) 2013 or 2014   Dr Evelena Leyden, Neurology  . Dementia   . Diabetes mellitus    Type I  . Diverticulosis   . Erosive esophagitis   . GERD (gastroesophageal reflux disease)   . History of swelling of feet    props up at night, circulations has been checked inlegs in past and was told it was ok  . Hyperlipidemia   . Hypertension   . Hypothyroidism   . Obesity   . Paroxysmal atrial fibrillation ([place]) 02/04/15   discovered on PPM remote interrogation 6/16  . Peptic stricture of esophagus   . Sick sinus syndrome [facility])    s/p Medtronic DDD pacemaker implanted; generator change 02/2012  . Sliding hiatal hernia     Past Surgical History:  Procedure Laterality Date  . APPENDECTOMY    . CATARACT EXTRACTION  05/2005  . CHOLECYSTECTOMY    . COLONOSCOPY W/ POLYPECTOMY  2003  . CORONARY ARTERY BYPASS GRAFT  02/2000   5 vessel  . PACEMAKER PLACEMENT  03/25/2000   by Dr Olevia Perches  . PERMANENT PACEMAKER GENERATOR CHANGE  03/18/2012   PPM gen change by Dr Rayann Heman  . REPLACEMENT TOTAL KNEE  2004   Right  . ROTATOR CUFF REPAIR Right 2009  . TONSILLECTOMY AND ADENOIDECTOMY     . TOTAL ABDOMINAL HYSTERECTOMY W/ BILATERAL SALPINGOOPHORECTOMY     Endometiosis  . TOTAL KNEE ARTHROPLASTY Left 07/19/2015   Procedure: LEFT TOTAL KNEE ARTHROPLASTY;  Surgeon: Latanya Maudlin, MD;  Location: [facility];  Service: Orthopedics;  Laterality: Left;    Social History   Social History  . Marital status: Widowed    Spouse name: N/A  . Number of children: 1  . Years of education: N/A   Occupational History  . Retired Retired   Social History Main Topics  . Smoking status: Former Research scientist (life sciences)  . Smokeless tobacco: Former Systems developer    Quit date: 11/23/1981     Comment: quit 28+ yrs ago (as of 2012)  . Alcohol use No  . Drug use: No  . Sexual activity: No   Other Topics Concern  . Not on file   Social History Narrative   Patient lives at home alone. Patient's daughter was with her [name] ) [phone]   Retired.   Education college   Right handed.   Caffeine one cup of coffee daily.   .    Current Outpatient Prescriptions on File Prior to Visit  Medication Sig Dispense Refill  . acarbose (PRECOSE) 50 MG tablet Take 1 tablet (  50 mg total) by mouth 2 (two) times daily with a meal. 60 tablet 11  . acetaminophen (TYLENOL) 500 MG tablet Take 500 mg by mouth every 8 (eight) hours as needed for mild pain. Take 2 tab = 1,000 mg PO Q 8 hours PRN    . atorvastatin (LIPITOR) 20 MG tablet TAKE 1 TABLET BY MOUTH EVERY DAY (TO REPLACE PRAVASTATIN) 90 tablet 3  . bromocriptine (PARLODEL) 2.5 MG tablet TAKE 1 TABLET BY MOUTH AT BEDTIME 30 tablet 10  . Calcium-Vitamin D 600-200 MG-UNIT per tablet Take 1 tablet by mouth daily.    . colesevelam (WELCHOL) 625 MG tablet Take 1,250 mg by mouth 3 (three) times daily.     Marland Kitchen diltiazem (CARDIZEM) 30 MG tablet Take 1 tablet (30 mg total) by mouth every 6 (six) hours as needed. 120 tablet 3  . donepezil (ARICEPT) 10 MG tablet TAKE 1 TABLET (10 MG TOTAL) BY MOUTH AT BEDTIME. 90 tablet 3  . ELIQUIS 2.5 MG TABS tablet TAKE 1 TABLET (2.5 MG TOTAL) BY  MOUTH 2 (TWO) TIMES DAILY. 60 tablet 3  . enalapril (VASOTEC) 20 MG tablet TAKE 1 TABLET BY MOUTH DAILY 90 tablet 0  . fish oil-omega-3 fatty acids 1000 MG capsule Take 1 g by mouth daily.    . INVOKANA 100 MG TABS tablet TAKE 1 TABLET BY MOUTH EVERY DAY 90 tablet 1  . levothyroxine (SYNTHROID, LEVOTHROID) 25 MCG tablet TAKE 1 AND 1/2 TABLET BY MOUTH EVERY DAY BEFORE BREAKFAST 135 tablet 0  . meclizine (ANTIVERT) 25 MG tablet Take 1 tablet (25 mg total) by mouth 3 (three) times daily as needed for dizziness. 20 tablet 0  . memantine (NAMENDA XR) 28 MG CP24 24 hr capsule Take 1 capsule (28 mg total) by mouth daily. 90 capsule 3  . metoprolol (TOPROL-XL) 200 MG 24 hr tablet TAKE 1 TABLET (200 MG TOTAL) BY MOUTH DAILY. 30 tablet 11  . MYRBETRIQ 25 MG TB24 tablet TAKE 1 TABLET (25 MG TOTAL) BY MOUTH DAILY. 90 tablet 3  . niacin 500 MG tablet Take 500 mg by mouth every evening.     . nitroGLYCERIN (NITROSTAT) 0.3 MG SL tablet Place 1 tablet (0.3 mg total) under the tongue every 5 (five) minutes as needed. For chest pain 90 tablet 3  . nystatin (MYCOSTATIN/NYSTOP) powder Use as directed twice per day as needed 45 g 5  . pantoprazole (PROTONIX) 40 MG tablet TAKE 1 TABLET BY MOUTH EVERY DAY 30 tablet 11  . sertraline (ZOLOFT) 25 MG tablet Take 1 tablet (25 mg total) by mouth at bedtime. 30 tablet 5  . sitaGLIPtin (JANUVIA) 100 MG tablet Take 50 mg by mouth daily. Take half tablet daily     No current facility-administered medications on file prior to visit.     Allergies  Allergen Reactions  . Codeine Rash  . Robaxin [Methocarbamol] Other (See Comments)    Too strong     Family History  Problem Relation Age of Onset  . Colon cancer Mother 57  . Breast cancer Mother   . Transient ischemic attack Mother   . Cancer Mother   . Hypertension Mother   . Stroke Mother   . Diabetes Mother   . Colon cancer Brother 75  . Cancer Brother   . Hypertension Brother   . Heart attack Brother   . Breast  cancer Sister   . Cancer Sister   . Diabetes Sister   . Prostate cancer Brother   . Heart  attack Brother   . Heart disease      3 brothers had MI; 1 pre 36  . Hypertension Sister   . Heart attack Daughter   . Diabetes Maternal Aunt     BP 128/64   Pulse 73   Ht 5\' 5"  (1.651 m)   Wt 203 lb (92.1 kg)   SpO2 98%   BMI 33.78 kg/m    Review of Systems She denies hypoglycemia    Objective:   Physical Exam GENERAL: no distress. VITAL SIGNS:  See vs page SKIN: no ulcer on the feet. feet are of normal color and temp. there is a vein harvest scar on the right leg.  EXTEMITIES: no deformity. no ulcer on the feet.  CV: Trace bilat leg edema. There are bilat varicosities.  PULSES: dorsalis pedis intact bilat.  NEURO: sensation is intact to touch on the feet, but decreased from normal.    A1c=6.4%    Assessment & Plan:  Type 2 DM: overcontrolled. Frail elderly state: goal a1c in this setting is in the 7's Edema: this limits oral dx rx options.

## 2016-09-08 ENCOUNTER — Other Ambulatory Visit: Payer: Self-pay | Admitting: *Deleted

## 2016-09-28 ENCOUNTER — Telehealth: Payer: Self-pay | Admitting: *Deleted

## 2016-09-28 ENCOUNTER — Telehealth: Payer: Self-pay | Admitting: Neurology

## 2016-09-28 ENCOUNTER — Telehealth: Payer: Self-pay | Admitting: Endocrinology

## 2016-09-28 MED ORDER — MEMANTINE HCL ER 28 MG PO CP24: 28.0000 mg | ORAL_CAPSULE | Freq: Every day | ORAL | 2 refills | Status: DC

## 2016-09-28 MED ORDER — LEVOTHYROXINE SODIUM 25 MCG PO TABS: ORAL_TABLET | ORAL | 2 refills | Status: DC

## 2016-09-28 MED ORDER — COLESEVELAM HCL 625 MG PO TABS: 1250.0000 mg | ORAL_TABLET | Freq: Three times a day (TID) | ORAL | 2 refills | Status: DC

## 2016-09-28 MED ORDER — DONEPEZIL HCL 10 MG PO TABS: ORAL_TABLET | ORAL | 2 refills | Status: DC

## 2016-09-28 MED ORDER — PANTOPRAZOLE SODIUM 40 MG PO TBEC
40.0000 mg | DELAYED_RELEASE_TABLET | Freq: Every day | ORAL | 2 refills | Status: DC
Start: 2016-09-28 — End: 2017-01-26

## 2016-09-28 MED ORDER — ATORVASTATIN CALCIUM 20 MG PO TABS: ORAL_TABLET | ORAL | 2 refills | Status: DC

## 2016-09-28 MED ORDER — MIRABEGRON ER 25 MG PO TB24: ORAL_TABLET | ORAL | 2 refills | Status: DC

## 2016-09-28 MED ORDER — SERTRALINE HCL 25 MG PO TABS: 25.0000 mg | ORAL_TABLET | Freq: Every day | ORAL | 2 refills | Status: DC

## 2016-09-28 NOTE — Telephone Encounter (Signed)
[name] called to advise they have changed pharmacy from [place] to [place]

## 2016-09-28 NOTE — Telephone Encounter (Signed)
Left msg on triage stating we have switch pharmacy from [facility] [address] to [facility] [address]. Would like 90 days on all meds. Updated chart.Marland KitchenJohny Chess

## 2016-09-28 NOTE — Telephone Encounter (Signed)
Preferred pharmacy changed to [place]. as requested/fim

## 2016-09-28 NOTE — Telephone Encounter (Signed)
Noted.   Pharmacy updated 

## 2016-09-28 NOTE — Telephone Encounter (Signed)
Pt daughter called to inform you that [facility] will [facility] [address].

## 2016-10-12 ENCOUNTER — Other Ambulatory Visit: Payer: Medicare Other

## 2016-10-12 ENCOUNTER — Encounter: Payer: Medicare Other | Admitting: Nurse Practitioner

## 2016-10-13 ENCOUNTER — Other Ambulatory Visit: Payer: Self-pay | Admitting: Internal Medicine

## 2016-10-25 ENCOUNTER — Other Ambulatory Visit: Payer: Self-pay | Admitting: Internal Medicine

## 2016-11-02 ENCOUNTER — Other Ambulatory Visit: Payer: Self-pay | Admitting: Internal Medicine

## 2016-11-02 ENCOUNTER — Other Ambulatory Visit: Payer: Self-pay | Admitting: Emergency Medicine

## 2016-11-02 MED ORDER — LEVOTHYROXINE SODIUM 25 MCG PO TABS: ORAL_TABLET | ORAL | 1 refills | Status: DC

## 2016-11-03 ENCOUNTER — Encounter: Payer: Self-pay | Admitting: Internal Medicine

## 2016-11-03 ENCOUNTER — Ambulatory Visit (INDEPENDENT_AMBULATORY_CARE_PROVIDER_SITE_OTHER): Payer: Medicare Other | Admitting: Internal Medicine

## 2016-11-03 VITALS — BP 144/72 | HR 68 | Temp 97.6°F | Resp 16 | Wt 212.0 lb

## 2016-11-03 DIAGNOSIS — R600 Localized edema: Secondary | ICD-10-CM | POA: Diagnosis not present

## 2016-11-03 DIAGNOSIS — R0982 Postnasal drip: Secondary | ICD-10-CM | POA: Insufficient documentation

## 2016-11-03 DIAGNOSIS — Z23 Encounter for immunization: Secondary | ICD-10-CM | POA: Diagnosis not present

## 2016-11-03 DIAGNOSIS — N183 Chronic kidney disease, stage 3 unspecified: Secondary | ICD-10-CM | POA: Insufficient documentation

## 2016-11-03 DIAGNOSIS — I10 Essential (primary) hypertension: Secondary | ICD-10-CM | POA: Diagnosis not present

## 2016-11-03 MED ORDER — FUROSEMIDE 20 MG PO TABS: 20.0000 mg | ORAL_TABLET | Freq: Every day | ORAL | 3 refills | Status: DC

## 2016-11-03 MED ORDER — ENALAPRIL MALEATE 20 MG PO TABS: 10.0000 mg | ORAL_TABLET | Freq: Every day | ORAL | 0 refills | Status: DC

## 2016-11-03 NOTE — Assessment & Plan Note (Signed)
Taking mucinex Can start taking claritin

## 2016-11-03 NOTE — Assessment & Plan Note (Signed)
Acute worsening of chronic edema Right anterior leg leaking clear fluid Start lasix 20 mg daily Home health referral for nurse to evaluate her at home cmp next week Low sodium diet Elevate legs

## 2016-11-03 NOTE — Progress Notes (Signed)
Pre visit review using our clinic review tool, if applicable. No additional management support is needed unless otherwise documented below in the visit note. 

## 2016-11-03 NOTE — Patient Instructions (Addendum)
  Medications reviewed and updated.  Changes include starting lasix 20 mg daily.  Decrease enalapril to 10 mg daily.   Your prescription(s) have been submitted to your pharmacy. Please take as directed and contact our office if you believe you are having problem(s) with the medication(s).    Please followup in 2 weeks +/-

## 2016-11-03 NOTE — Progress Notes (Signed)
Subjective:    Patient ID: [name], female    DOB: 06-03-1925, 82 y.o.   MRN: [id number]  HPI The patient is here for follow up.  Edema:  She has chronic leg swelling.  For the past couple of weeks her leg swelling has been worse and her right leg has been leaking clear fluid.  She denies any pain in leg.  She has chronic slight redness back of her calves - this is unchanged.  She denies any injuries to her leg, but may have scratched the leg.  She has not had any long car rides.  She sits a lot.  There are no changes in her diet - she is not always compliant with a low sodium diet.  She does not elevate her legs when sitting.     CKD:  She has chronic kidney disease.  She has not seen a kidney specialist.    PND:  She saw Dr Ernesto Rutherford and was advised to take mucinex.  It is clear drainage. There is no sign of infection.  She was wondering what else she can take.    Hypertension: She is taking her medication daily. She is not always compliant with a low sodium diet.  She denies chest pain, shortness of breath and regular headaches. She has occasional palps. She is not exercising regularly.  She does not monitor her blood pressure at home.      Medications and allergies reviewed with patient and updated if appropriate.  Patient Active Problem List   Diagnosis Date Noted  . Anxiety and depression 06/22/2016  . Intertrigo 06/12/2016  . Paronychia of fifth finger of right hand 06/12/2016  . History of total knee arthroplasty 07/19/2015  . At risk for sleep apnea 07/17/2015  . Memory loss 04/05/2014  . Chronic renal insufficiency, stage II (mild) 03/21/2013  . Encounter for long-term (current) use of other medications 07/29/2012  . Diabetes ([place]) 04/28/2012  . CVA (cerebral vascular accident) ([place])   . EDEMA- LOCALIZED 01/06/2011  . Chronic systolic heart failure ([place]) 12/26/2010  . GERD 04/30/2010  . TRANSIENT ISCHEMIC ATTACKS, HX OF 04/30/2010  . DYSPEPSIA 04/14/2010  .  Generalized weakness 03/19/2010  . CONSTIPATION 02/13/2010  . ANEMIA, MILD 01/14/2010  . Hyperlipidemia 09/16/2008  . HYPERTENSION, BENIGN 09/16/2008  . CAD, AUTOLOGOUS BYPASS GRAFT 09/16/2008  . SICK SINUS/ TACHY-BRADY SYNDROME 09/16/2008  . PACEMAKER, PERMANENT 09/16/2008  . Coronary atherosclerosis 10/25/2007  . Hypothyroidism 06/14/2007  . COLONIC POLYPS, HX OF 03/23/2007    Current Outpatient Prescriptions on File Prior to Visit  Medication Sig Dispense Refill  . acarbose (PRECOSE) 50 MG tablet Take 1 tablet (50 mg total) by mouth 2 (two) times daily with a meal. 60 tablet 11  . acetaminophen (TYLENOL) 500 MG tablet Take 500 mg by mouth every 8 (eight) hours as needed for mild pain. Take 2 tab = 1,000 mg PO Q 8 hours PRN    . atorvastatin (LIPITOR) 20 MG tablet TAKE 1 TABLET BY MOUTH EVERY DAY (TO REPLACE PRAVASTATIN) 90 tablet 2  . bromocriptine (PARLODEL) 2.5 MG tablet TAKE 1 TABLET BY MOUTH AT BEDTIME 30 tablet 10  . Calcium-Vitamin D 600-200 MG-UNIT per tablet Take 1 tablet by mouth daily.    . colesevelam (WELCHOL) 625 MG tablet Take 2 tablets (1,250 mg total) by mouth 3 (three) times daily. 360 tablet 2  . diltiazem (CARDIZEM) 30 MG tablet Take 1 tablet (30 mg total) by mouth every 6 (six) hours as  needed. 120 tablet 3  . donepezil (ARICEPT) 10 MG tablet TAKE 1 TABLET (10 MG TOTAL) BY MOUTH AT BEDTIME. 90 tablet 2  . ELIQUIS 2.5 MG TABS tablet TAKE 1 TABLET (2.5 MG TOTAL) BY MOUTH 2 (TWO) TIMES DAILY. 60 tablet 3  . enalapril (VASOTEC) 20 MG tablet TAKE 1 TABLET BY MOUTH DAILY 90 tablet 0  . fish oil-omega-3 fatty acids 1000 MG capsule Take 1 g by mouth daily.    . INVOKANA 100 MG TABS tablet TAKE 1 TABLET BY MOUTH EVERY DAY 90 tablet 1  . levothyroxine (SYNTHROID, LEVOTHROID) 25 MCG tablet TAKE 1 AND 1/2 TABLET BY MOUTH EVERY DAY BEFORE BREAKFAST 135 tablet 1  . meclizine (ANTIVERT) 25 MG tablet Take 1 tablet (25 mg total) by mouth 3 (three) times daily as needed for  dizziness. 20 tablet 0  . memantine (NAMENDA XR) 28 MG CP24 24 hr capsule Take 1 capsule (28 mg total) by mouth daily. 90 capsule 2  . metoprolol (TOPROL-XL) 200 MG 24 hr tablet TAKE 1 TABLET (200 MG TOTAL) BY MOUTH DAILY. 30 tablet 11  . mirabegron ER (MYRBETRIQ) 25 MG TB24 tablet TAKE 1 TABLET (25 MG TOTAL) BY MOUTH DAILY. 90 tablet 2  . niacin 500 MG tablet Take 500 mg by mouth every evening.     . nitroGLYCERIN (NITROSTAT) 0.3 MG SL tablet Place 1 tablet (0.3 mg total) under the tongue every 5 (five) minutes as needed. For chest pain 90 tablet 3  . nystatin (MYCOSTATIN/NYSTOP) powder Use as directed twice per day as needed 45 g 5  . pantoprazole (PROTONIX) 40 MG tablet Take 1 tablet (40 mg total) by mouth daily. 90 tablet 2  . repaglinide (PRANDIN) 1 MG tablet Take 1 tablet (1 mg total) by mouth 3 (three) times daily before meals. 270 tablet 3  . sertraline (ZOLOFT) 25 MG tablet Take 1 tablet (25 mg total) by mouth at bedtime. 90 tablet 2  . sitaGLIPtin (JANUVIA) 100 MG tablet Take 50 mg by mouth daily. Take half tablet daily     No current facility-administered medications on file prior to visit.     Past Medical History:  Diagnosis Date  . Adenomatous colon polyp 07/2002  . Arthritis   . Coronary artery disease    s/p CABG 2001 x 5  . CVA (cerebral vascular accident) [facility]) 2013 or 2014   Dr Evelena Leyden, Neurology  . Dementia   . Diabetes mellitus    Type I  . Diverticulosis   . Erosive esophagitis   . GERD (gastroesophageal reflux disease)   . History of swelling of feet    props up at night, circulations has been checked inlegs in past and was told it was ok  . Hyperlipidemia   . Hypertension   . Hypothyroidism   . Obesity   . Paroxysmal atrial fibrillation ([place]) 02/04/15   discovered on PPM remote interrogation 6/16  . Peptic stricture of esophagus   . Sick sinus syndrome [facility])    s/p Medtronic DDD pacemaker implanted; generator change 02/2012  . Sliding hiatal hernia     Past  Surgical History:  Procedure Laterality Date  . APPENDECTOMY    . CATARACT EXTRACTION  05/2005  . CHOLECYSTECTOMY    . COLONOSCOPY W/ POLYPECTOMY  2003  . CORONARY ARTERY BYPASS GRAFT  02/2000   5 vessel  . PACEMAKER PLACEMENT  03/25/2000   by Dr Olevia Perches  . PERMANENT PACEMAKER GENERATOR CHANGE  03/18/2012   PPM gen change by  Dr Rayann Heman  . REPLACEMENT TOTAL KNEE  2004   Right  . ROTATOR CUFF REPAIR Right 2009  . TONSILLECTOMY AND ADENOIDECTOMY    . TOTAL ABDOMINAL HYSTERECTOMY W/ BILATERAL SALPINGOOPHORECTOMY     Endometiosis  . TOTAL KNEE ARTHROPLASTY Left 07/19/2015   Procedure: LEFT TOTAL KNEE ARTHROPLASTY;  Surgeon: Latanya Maudlin, MD;  Location: [facility];  Service: Orthopedics;  Laterality: Left;    Social History   Social History  . Marital status: Widowed    Spouse name: N/A  . Number of children: 1  . Years of education: N/A   Occupational History  . Retired Retired   Social History Main Topics  . Smoking status: Former Research scientist (life sciences)  . Smokeless tobacco: Former Systems developer    Quit date: 11/23/1981     Comment: quit 28+ yrs ago (as of 2012)  . Alcohol use No  . Drug use: No  . Sexual activity: No   Other Topics Concern  . Not on file   Social History Narrative   Patient lives at home alone. Patient's daughter was with her [name] ) [phone]   Retired.   Education college   Right handed.   Caffeine one cup of coffee daily.   .    Family History  Problem Relation Age of Onset  . Colon cancer Mother 15  . Breast cancer Mother   . Transient ischemic attack Mother   . Cancer Mother   . Hypertension Mother   . Stroke Mother   . Diabetes Mother   . Colon cancer Brother 84  . Cancer Brother   . Hypertension Brother   . Heart attack Brother   . Breast cancer Sister   . Cancer Sister   . Diabetes Sister   . Prostate cancer Brother   . Heart attack Brother   . Heart disease      3 brothers had MI; 1 pre 27  . Hypertension Sister   . Heart attack Daughter   .  Diabetes Maternal Aunt     Review of Systems  Constitutional: Negative for chills and fever.  HENT: Positive for postnasal drip. Negative for sinus pressure.   Respiratory: Positive for cough (PND). Negative for shortness of breath and wheezing.   Cardiovascular: Positive for palpitations (occ) and leg swelling. Negative for chest pain.  Neurological: Positive for headaches (occ). Negative for light-headedness.       Objective:   Vitals:   11/03/16 1610  BP: (!) 144/72  Pulse: 68  Resp: 16  Temp: 97.6 F (36.4 C)   Filed Weights   11/03/16 1610  Weight: 212 lb (96.2 kg)   Body mass index is 35.28 kg/m.   Physical Exam    Constitutional: Appears well-developed and well-nourished. No distress.  HENT:  Head: Normocephalic and atraumatic.  Neck: Neck supple. No tracheal deviation present. No thyromegaly present.  No cervical lymphadenopathy Cardiovascular: Normal rate, regular rhythm and normal heart sounds.   No murmur heard. No carotid bruit .  2+ pitting b/l LE edema Pulmonary/Chest: Effort normal and breath sounds normal. No respiratory distress. No has no wheezes. No rales.  Skin: Skin is warm and dry. Not diaphoretic. left anterior lower leg with scratch mark with clear draining fluid Psychiatric: Normal mood and affect. Behavior is normal.      Assessment & Plan:    See Problem List for Assessment and Plan of chronic medical problems.

## 2016-11-03 NOTE — Assessment & Plan Note (Signed)
Decrease enalapril from 20 to 10 - will likely need to d/c if GFR decreases Start lasix 20 mg daily for fluid overload Will likely need to see nephrology cmp next week

## 2016-11-03 NOTE — Assessment & Plan Note (Signed)
BP elevated today Decrease enalapril from 20 mg to 10 mg due to CKD - may need to eliminate Start lasix 20 mg daily Continue metoprolol 200 mg daily, cardizem 30 mg 6 hr prn Follow up in 2 weeks cmp next week

## 2016-11-04 NOTE — Addendum Note (Signed)
Addended by: Karle Barr on: 11/04/2016 07:57 AM   Modules accepted: Orders

## 2016-11-11 ENCOUNTER — Encounter: Payer: Medicare Other | Admitting: Nurse Practitioner

## 2016-11-11 ENCOUNTER — Other Ambulatory Visit: Payer: Medicare Other

## 2016-11-24 ENCOUNTER — Telehealth: Payer: Self-pay | Admitting: Emergency Medicine

## 2016-11-24 NOTE — Progress Notes (Signed)
Electrophysiology Office Note Date: 11/25/2016  ID:  [name], [name] [birth year], MRN [id number]  PCP: Binnie Rail, MD Electrophysiologist: Allred  CC: routine PPM follow up  [name] is a 83 y.o. female seen today for Dr Rayann Heman. She presents today for routine electrophysiology follow-up.  Since last being seen in clinic, she reports doing reasonably well. She has had worsening LE edema and has been put on low dose Lasix by PCP.  Labs were drawn yesterday but not back yet. She has occasional palpitations, but denies chest pain, dyspnea, PND, orthopnea, nausea, vomiting, dizziness, syncope.  Last echo 2011 demonstrated EF 45-50%, hypokinesis of mid-distal inferior and posterior myocardium, grade 1 diastolic dysfunction, LA 42.   Device History: MDT dual chamber PPM implanted 2001 for SSS by Dr Olevia Perches; gen change 2013 Dr Rayann Heman   Past Medical History:  Diagnosis Date  . Adenomatous colon polyp 07/2002  . Arthritis   . Coronary artery disease    s/p CABG 2001 x 5  . CVA (cerebral vascular accident) [facility]) 2013 or 2014   Dr Evelena Leyden, Neurology  . Dementia   . Diabetes mellitus    Type I  . Diverticulosis   . Erosive esophagitis   . GERD (gastroesophageal reflux disease)   . History of swelling of feet    props up at night, circulations has been checked inlegs in past and was told it was ok  . Hyperlipidemia   . Hypertension   . Hypothyroidism   . Obesity   . Paroxysmal atrial fibrillation ([place]) 02/04/15   discovered on PPM remote interrogation 6/16  . Peptic stricture of esophagus   . Sick sinus syndrome [facility])    s/p Medtronic DDD pacemaker implanted; generator change 02/2012  . Sliding hiatal hernia    Past Surgical History:  Procedure Laterality Date  . APPENDECTOMY    . CATARACT EXTRACTION  05/2005  . CHOLECYSTECTOMY    . COLONOSCOPY W/ POLYPECTOMY  2003  . CORONARY ARTERY BYPASS GRAFT  02/2000   5 vessel  . PACEMAKER PLACEMENT  03/25/2000   by Dr Olevia Perches  .  PERMANENT PACEMAKER GENERATOR CHANGE  03/18/2012   PPM gen change by Dr Rayann Heman  . REPLACEMENT TOTAL KNEE  2004   Right  . ROTATOR CUFF REPAIR Right 2009  . TONSILLECTOMY AND ADENOIDECTOMY    . TOTAL ABDOMINAL HYSTERECTOMY W/ BILATERAL SALPINGOOPHORECTOMY     Endometiosis  . TOTAL KNEE ARTHROPLASTY Left 07/19/2015   Procedure: LEFT TOTAL KNEE ARTHROPLASTY;  Surgeon: Latanya Maudlin, MD;  Location: [facility];  Service: Orthopedics;  Laterality: Left;    Current Outpatient Prescriptions  Medication Sig Dispense Refill  . acarbose (PRECOSE) 50 MG tablet Take 1 tablet (50 mg total) by mouth 2 (two) times daily with a meal. 60 tablet 11  . acetaminophen (TYLENOL) 500 MG tablet Take 500 mg by mouth every 8 (eight) hours as needed for mild pain. Take 2 tab = 1,000 mg PO Q 8 hours PRN    . atorvastatin (LIPITOR) 20 MG tablet TAKE 1 TABLET BY MOUTH EVERY DAY (TO REPLACE PRAVASTATIN) 90 tablet 2  . bromocriptine (PARLODEL) 2.5 MG tablet TAKE 1 TABLET BY MOUTH AT BEDTIME 30 tablet 10  . Calcium-Vitamin D 600-200 MG-UNIT per tablet Take 1 tablet by mouth daily.    . colesevelam (WELCHOL) 625 MG tablet Take 2 tablets (1,250 mg total) by mouth 3 (three) times daily. 360 tablet 2  . diltiazem (CARDIZEM) 30 MG tablet Take 1 tablet (  30 mg total) by mouth every 6 (six) hours as needed. 120 tablet 3  . donepezil (ARICEPT) 10 MG tablet TAKE 1 TABLET (10 MG TOTAL) BY MOUTH AT BEDTIME. 90 tablet 2  . ELIQUIS 2.5 MG TABS tablet TAKE 1 TABLET (2.5 MG TOTAL) BY MOUTH 2 (TWO) TIMES DAILY. 60 tablet 3  . enalapril (VASOTEC) 20 MG tablet Take 0.5 tablets (10 mg total) by mouth daily. 45 tablet 0  . furosemide (LASIX) 20 MG tablet Take 1 tablet (20 mg total) by mouth daily. 30 tablet 3  . INVOKANA 100 MG TABS tablet TAKE 1 TABLET BY MOUTH EVERY DAY 90 tablet 1  . levothyroxine (SYNTHROID, LEVOTHROID) 25 MCG tablet TAKE 1 AND 1/2 TABLET BY MOUTH EVERY DAY BEFORE BREAKFAST 135 tablet 1  . meclizine (ANTIVERT) 25 MG tablet Take  1 tablet (25 mg total) by mouth 3 (three) times daily as needed for dizziness. 20 tablet 0  . memantine (NAMENDA XR) 28 MG CP24 24 hr capsule Take 1 capsule (28 mg total) by mouth daily. 90 capsule 2  . metoprolol (TOPROL-XL) 200 MG 24 hr tablet TAKE 1 TABLET (200 MG TOTAL) BY MOUTH DAILY. 30 tablet 11  . mirabegron ER (MYRBETRIQ) 25 MG TB24 tablet TAKE 1 TABLET (25 MG TOTAL) BY MOUTH DAILY. 90 tablet 2  . niacin 500 MG tablet Take 500 mg by mouth every evening.     . nitroGLYCERIN (NITROSTAT) 0.3 MG SL tablet Place 1 tablet (0.3 mg total) under the tongue every 5 (five) minutes as needed. For chest pain 90 tablet 3  . nystatin (MYCOSTATIN/NYSTOP) powder Use as directed twice per day as needed 45 g 5  . pantoprazole (PROTONIX) 40 MG tablet Take 1 tablet (40 mg total) by mouth daily. 90 tablet 2  . repaglinide (PRANDIN) 1 MG tablet Take 1 tablet (1 mg total) by mouth 3 (three) times daily before meals. 270 tablet 3  . sertraline (ZOLOFT) 25 MG tablet Take 1 tablet (25 mg total) by mouth at bedtime. 90 tablet 2  . sitaGLIPtin (JANUVIA) 100 MG tablet Take 50 mg by mouth daily. Take half tablet daily     No current facility-administered medications for this visit.     Allergies:   Codeine and Robaxin [methocarbamol]   Social History: Social History   Social History  . Marital status: Widowed    Spouse name: N/A  . Number of children: 1  . Years of education: N/A   Occupational History  . Retired Retired   Social History Main Topics  . Smoking status: Former Research scientist (life sciences)  . Smokeless tobacco: Former Systems developer    Quit date: 11/23/1981     Comment: quit 28+ yrs ago (as of 2012)  . Alcohol use No  . Drug use: No  . Sexual activity: No   Other Topics Concern  . Not on file   Social History Narrative   Patient lives at home alone. Patient's daughter was with her [name] ) [phone]   Retired.   Education college   Right handed.   Caffeine one cup of coffee daily.   .    Family  History: Family History  Problem Relation Age of Onset  . Colon cancer Mother 74  . Breast cancer Mother   . Transient ischemic attack Mother   . Cancer Mother   . Hypertension Mother   . Stroke Mother   . Diabetes Mother   . Colon cancer Brother 67  . Cancer Brother   . Hypertension  Brother   . Heart attack Brother   . Breast cancer Sister   . Cancer Sister   . Diabetes Sister   . Prostate cancer Brother   . Heart attack Brother   . Heart disease      3 brothers had MI; 1 pre 59  . Hypertension Sister   . Heart attack Daughter   . Diabetes Maternal Aunt      Review of Systems: All other systems reviewed and are otherwise negative except as noted above.   Physical Exam: VS:  BP (!) 100/52 (BP Location: Right Arm, Patient Position: Sitting, Cuff Size: Large)   Pulse 65   Ht 5\' 5"  (1.651 m)   Wt 204 lb 9.6 oz (92.8 kg)   SpO2 94%   BMI 34.05 kg/m  , BMI Body mass index is 34.05 kg/m.  GEN- The patient is elderly and obese appearing, alert and oriented x 3 today.   HEENT: normocephalic, atraumatic; sclera clear, conjunctiva pink; hearing intact; oropharynx clear; neck supple  Lungs- Clear to ausculation bilaterally, normal work of breathing.  No wheezes, rales, rhonchi Heart- Regular rate and rhythm (paced) GI- soft, non-tender, non-distended, bowel sounds present  Extremities- no clubbing, cyanosis, 1+ BLE edema  MS- no significant deformity or atrophy Skin- warm and dry, no rash or lesion; PPM pocket well healed Psych- euthymic mood, full affect Neuro- strength and sensation are intact  PPM Interrogation- reviewed in detail today,  See PACEART report  EKG:  EKG is not ordered today.  Recent Labs: 03/28/2016: ALT 26 06/09/2016: BUN 24; Creat 1.69; Hemoglobin 12.1; Platelets 158; Potassium 4.8; Sodium 140   Wt Readings from Last 3 Encounters:  11/25/16 204 lb 9.6 oz (92.8 kg)  11/03/16 212 lb (96.2 kg)  08/24/16 203 lb (92.1 kg)     Other studies  Reviewed: Additional studies/ records that were reviewed today include: Dr Jackalyn Lombard notes, cath reports  Assessment and Plan:  1.  Symptomatic bradycardia Normal PPM function See Pace Art report No changes today  2.  Paroxysmal atrial fibrillation Burden by device interrogation 0.7% V rates slightly elevated  Continue Eliquis (2.5mg  for age >48, creat>1.5) for CHADS2VASC score of at least 8 Recent BMET, CBC stable  3.  CAD s/p CABG No recent ischemic symptoms No ASA/Plavix with need for [place]   4.  HTN Stable No change required today  5.  LE edema Improved with low dose Lasix Compression hose recommended   Current medicines are reviewed at length with the patient today.   The patient does not have concerns regarding her medicines.  The following changes were made today:  none  Labs/ tests ordered today include: none    Disposition:   Follow up with [facility], Dr Rayann Heman 1 year    Signed, Kathy Marshall, NP 11/25/2016 10:29 AM  [place] [address] [place] [address] [phone] (office) [phone] (fax)

## 2016-11-24 NOTE — Telephone Encounter (Signed)
Spoke with Liji to give verbal orders per MD

## 2016-11-24 NOTE — Telephone Encounter (Signed)
[facility] called and needs verbal orders for PT. Frequency of 1 wk 1, 2 wk 3 and 1 wk 2. Please advise thanks.

## 2016-11-25 ENCOUNTER — Encounter: Payer: Self-pay | Admitting: Nurse Practitioner

## 2016-11-25 ENCOUNTER — Other Ambulatory Visit: Payer: Medicare Other | Admitting: *Deleted

## 2016-11-25 ENCOUNTER — Ambulatory Visit (INDEPENDENT_AMBULATORY_CARE_PROVIDER_SITE_OTHER): Payer: Medicare Other | Admitting: Nurse Practitioner

## 2016-11-25 VITALS — BP 100/52 | HR 65 | Ht 65.0 in | Wt 204.6 lb

## 2016-11-25 DIAGNOSIS — I10 Essential (primary) hypertension: Secondary | ICD-10-CM

## 2016-11-25 DIAGNOSIS — I480 Paroxysmal atrial fibrillation: Secondary | ICD-10-CM | POA: Diagnosis not present

## 2016-11-25 DIAGNOSIS — R600 Localized edema: Secondary | ICD-10-CM | POA: Diagnosis not present

## 2016-11-25 DIAGNOSIS — E7800 Pure hypercholesterolemia, unspecified: Secondary | ICD-10-CM

## 2016-11-25 DIAGNOSIS — I2510 Atherosclerotic heart disease of native coronary artery without angina pectoris: Secondary | ICD-10-CM

## 2016-11-25 DIAGNOSIS — R001 Bradycardia, unspecified: Secondary | ICD-10-CM

## 2016-11-25 LAB — CUP PACEART INCLINIC DEVICE CHECK
Implantable Lead Implant Date: 20010503
Implantable Lead Model: 5076
Implantable Lead Model: 5076
Implantable Pulse Generator Implant Date: 20130426
MDC IDC LEAD IMPLANT DT: 20010503
MDC IDC LEAD LOCATION: 753859
MDC IDC LEAD LOCATION: 753860
MDC IDC SESS DTM: 20180103104007

## 2016-11-25 NOTE — Patient Instructions (Signed)
Medication Instructions:  Your physician recommends that you continue on your current medications as directed. Please refer to the Current Medication list given to you today.   Labwork: None Ordered   Testing/Procedures: None Ordered   Follow-Up: Remote monitoring is used to monitor your Carelink from home. This monitoring reduces the number of office visits required to check your device to one time per year. It allows [place] to keep an eye on the functioning of your device to ensure it is working properly. You are scheduled for a device check from home on 02/24/17. You may send your transmission at any time that day. If you have a wireless device, the transmission will be sent automatically. After your physician reviews your transmission, you will receive a postcard with your next transmission date.  Your physician wants you to follow-up in: 1 year with Dr. Rayann Heman.  You will receive a reminder letter in the mail two months in advance. If you don't receive a letter, please call our office to schedule the follow-up appointment.   If you need a refill on your cardiac medications before your next appointment, please call your pharmacy.   Thank you for choosing [facility]! Christen Bame, RN [phone]

## 2016-11-30 ENCOUNTER — Other Ambulatory Visit: Payer: Self-pay | Admitting: Internal Medicine

## 2016-12-01 ENCOUNTER — Other Ambulatory Visit: Payer: Self-pay | Admitting: Endocrinology

## 2016-12-02 ENCOUNTER — Telehealth: Payer: Self-pay | Admitting: Internal Medicine

## 2016-12-02 NOTE — Telephone Encounter (Signed)
Spoke with pts daughter. I have LVM with Danielle with [facility] to fax over results from blood work. I do not recall seeing them.   Will fax to Dr Rayann Heman when we receive.

## 2016-12-02 NOTE — Telephone Encounter (Signed)
Pt daughter called in and would like someone to call her with results of labs

## 2016-12-03 ENCOUNTER — Ambulatory Visit (INDEPENDENT_AMBULATORY_CARE_PROVIDER_SITE_OTHER): Payer: Medicare Other | Admitting: Cardiovascular Disease

## 2016-12-03 NOTE — Progress Notes (Signed)
Pt was started on Eliquis for Afib on 05/20/15.  Pt is currently on 2.5mg  BID.   Reviewed patients medication list.  Pt is not  currently on any combined P-gp and strong CYP3A4 inhibitors/inducers (ketoconazole, traconazole, ritonavir, carbamazepine, phenytoin, rifampin, St. John's wort).  Reviewed labs.  SCr 1.41, Weight 92.8Kg, Age 83 yrs-old. Dose inappropriate based on age, weight, and SCr, thus Pharm D-Kelly states increase dose to 5mg  BID.   Hgb and HCT will be drawn in 87mths was not drawn at last OV.   A full discussion of the nature of anticoagulants has been carried out.  A benefit/risk analysis has been presented to the patient, so that they understand the justification for choosing anticoagulation with Eliquis at this time.  The need for compliance is stressed.  Daughter, [name] is aware to take the medication twice daily and increase dosage to 2 pills (5mg ) twice a day.   Side effects of potential bleeding are discussed, including unusual colored urine or stools, coughing up blood or coffee ground emesis, nose bleeds or serious fall or head trauma.  Discussed signs and symptoms of stroke. The patient should avoid any OTC items containing aspirin or ibuprofen.  Avoid alcohol consumption.   Call if any signs of abnormal bleeding.  Discussed financial obligations and resolved any difficulty in obtaining medication.  Next lab test test in 3 months, appt scheduled for 02/23/17 @12noon .

## 2016-12-14 NOTE — Telephone Encounter (Signed)
LVM with Andee Poles again, regarding Blood Work.

## 2016-12-15 ENCOUNTER — Telehealth: Payer: Self-pay | Admitting: Endocrinology

## 2016-12-15 ENCOUNTER — Telehealth: Payer: Self-pay | Admitting: Internal Medicine

## 2016-12-15 MED ORDER — APIXABAN 2.5 MG PO TABS: 5.0000 mg | ORAL_TABLET | Freq: Two times a day (BID) | ORAL | 3 refills | Status: DC

## 2016-12-15 NOTE — Telephone Encounter (Signed)
Med list updated

## 2016-12-15 NOTE — Telephone Encounter (Signed)
Pharmacy updated.

## 2016-12-15 NOTE — Telephone Encounter (Signed)
FYI: Pt has now Doubled Eliquis 2 tablets 2xs a daily per Kidney Doc.

## 2016-12-15 NOTE — Telephone Encounter (Signed)
Patient daughter stated this is the only pharmacy patient is now using.   [id number] - [place], [place] [phone] (Phone) [phone] (Fax)   Delete the other one, she asked

## 2016-12-15 NOTE — Telephone Encounter (Deleted)
[id number] - [place], [place] [phone] (Phone) [phone] (Fax)   This is the only pharmacy patient is now using.  Please take the other one out.

## 2016-12-16 ENCOUNTER — Telehealth: Payer: Self-pay | Admitting: Internal Medicine

## 2016-12-16 NOTE — Telephone Encounter (Signed)
Requesting verbal ok to add one visit to this week and an extra visit to next week due to missed visits during the bad weather before discharging patient.

## 2016-12-16 NOTE — Telephone Encounter (Signed)
Spoke with Liji to give verbal orders per MD.

## 2016-12-29 ENCOUNTER — Telehealth: Payer: Self-pay | Admitting: Internal Medicine

## 2016-12-29 NOTE — Telephone Encounter (Signed)
If there is no confusion an  Infection or something else going on seems less likely.   We can try increasing the sertraline (zoloft) to see if that helps.    We can have her see a psychiatrist or she could follow up with her neurologist a little if needed.    If we increased medication she should follow up with me in 4 weeks.

## 2016-12-29 NOTE — Telephone Encounter (Signed)
PLEASE NOTE: All timestamps contained within this report are represented as [place] Standard Time. CONFIDENTIALTY NOTICE: This fax transmission is intended only for the addressee. It contains information that is legally privileged, confidential or otherwise protected from use or disclosure. If you are not the intended recipient, you are strictly prohibited from reviewing, disclosing, copying using or disseminating any of this information or taking any action in reliance on or regarding this information. If you have received this fax in error, please notify us immediately by telephone so that we can arrange for its return to [place]. Phone: [phone], Toll-Free: [phone], Fax: [phone] Page: 1 of 1 Call Id: [id number] [facility] - Client [place] Patient Name: [name] DOB: [birth year] Initial Comment Caller states her mother is lashing out and just angry in general. Nurse Assessment Nurse: Andria Frames, RN, Aeriel Date/Time (Eastern Time): 12/29/2016 1:17:39 PM Confirm and document reason for call. If symptomatic, describe symptoms. ---Caller states, pt is lashing out. The doctor is aware. They had put her on zoloft to help. Today it has been really bad. Today she made accusations to her caretaker about the daughter. It has never bad as it is today. She states, she is going to call a cab and go back to her house when she is not there. She is not confused today. She is just angry. She says things like she just is waiting to die. She never says she has a plan or is going to kill herself. She cannot live at home alone. She just has bouts of anger. They can be calm and then all of a sudden she snaps sometimes. She is wondering if there is something they can do instead. Does the patient have any new or worsening symptoms? ---Yes Will a triage be completed? ---Yes Related visit to physician within the last 2 weeks? ---N/A Does the  PT have any chronic conditions? (i.e. diabetes, asthma, etc.) ---Yes List chronic conditions. ---TIA, stroke, heart attacks, dementia Is this a behavioral health or substance abuse call? ---No Guidelines Guideline Title Affirmed Question Affirmed Notes Depression Bizarre or confused behavior Final Disposition User Go to ED Now (or PCP triage) Andria Frames, RN, Aeriel Comments Caller disagrees states, pt will not go. Caller is really just looking for answers. She either wants an appt or to see if their is something else they can give her mother. Disagree/Comply: Disagree Disagree/Comply Reason: Disagree with instructions

## 2016-12-31 ENCOUNTER — Encounter: Payer: Self-pay | Admitting: Podiatry

## 2016-12-31 ENCOUNTER — Ambulatory Visit (INDEPENDENT_AMBULATORY_CARE_PROVIDER_SITE_OTHER): Payer: Medicare Other | Admitting: Podiatry

## 2016-12-31 VITALS — Ht 65.0 in | Wt 204.0 lb

## 2016-12-31 DIAGNOSIS — B351 Tinea unguium: Secondary | ICD-10-CM | POA: Diagnosis not present

## 2016-12-31 DIAGNOSIS — E1151 Type 2 diabetes mellitus with diabetic peripheral angiopathy without gangrene: Secondary | ICD-10-CM

## 2016-12-31 MED ORDER — SERTRALINE HCL 50 MG PO TABS: 50.0000 mg | ORAL_TABLET | Freq: Every day | ORAL | 1 refills | Status: DC

## 2016-12-31 NOTE — Telephone Encounter (Signed)
Increase to 50 mg daily. Sent new prescription to pharmacy-she can take 2 of what she has now. Follow-up with me in 4 weeks.

## 2016-12-31 NOTE — Telephone Encounter (Signed)
Spoke with pts daughter to inform. Appt for follow-up has been scheduled.

## 2016-12-31 NOTE — Progress Notes (Signed)
Complaint:  Visit Type: Patient returns to my office for continued preventative foot care services. Complaint: Patient states" my nails have grown long and thick and become painful to walk and wear shoes" Patient has been diagnosed with DM with vascular concerns.. The patient presents for preventative foot care services. No changes to ROS  Podiatric Exam: Vascular: dorsalis pedis and posterior tibial pulses are not  palpable bilateral due to excessive foot and ankle swelling. Capillary return is immediate. Temperature gradient is WNL. Skin turgor WNL  Sensorium: Normal Semmes Weinstein monofilament test. Normal tactile sensation bilaterally. Nail Exam: Pt has thick disfigured discolored nails with subungual debris noted bilateral entire nail hallux through fifth toenails.  Pincer hallux nails noted. Ulcer Exam: There is no evidence of ulcer or pre-ulcerative changes or infection. Orthopedic Exam: Muscle tone and strength are WNL. No limitations in general ROM. No crepitus or effusions noted. Foot type and digits show no abnormalities. Bony prominences are unremarkable. Skin: No Porokeratosis. No infection or ulcers  Diagnosis:  Onychomycosis, , Pain in right toe, pain in left toes  Treatment & Plan Procedures and Treatment: Consent by patient was obtained for treatment procedures. The patient understood the discussion of treatment and procedures well. All questions were answered thoroughly reviewed. Debridement of mycotic and hypertrophic toenails, 1 through 5 bilateral and clearing of subungual debris. No ulceration, no infection noted.  Return Visit-Office Procedure: Patient instructed to return to the office for a follow up visit 3 months for continued evaluation and treatment.    Deysi Soldo DPM 

## 2016-12-31 NOTE — Telephone Encounter (Signed)
Spoke with pts daughter. She would like to increase the Zoloft. Please advise.

## 2017-01-08 ENCOUNTER — Other Ambulatory Visit: Payer: Self-pay | Admitting: Nurse Practitioner

## 2017-01-08 ENCOUNTER — Other Ambulatory Visit: Payer: Self-pay | Admitting: *Deleted

## 2017-01-08 MED ORDER — APIXABAN 5 MG PO TABS: 5.0000 mg | ORAL_TABLET | Freq: Two times a day (BID) | ORAL | 0 refills | Status: DC

## 2017-01-08 NOTE — Telephone Encounter (Signed)
[name] is calling on behalf of [name], states that she is trying to have her [name] [name] medication refilled today before 5pm. Please call thanks.

## 2017-01-08 NOTE — Telephone Encounter (Signed)
New Message     *STAT* If patient is at the pharmacy, call can be transferred to refill team.   1. Which medications need to be refilled? (please list name of each medication and dose if known) apixaban (ELIQUIS) 2.5 MG TABS tablet  2. Which pharmacy/location (including street and city if local pharmacy) is medication to be sent to? [id number] [address], [place]  3. Do they need a 30 day or 90 day supply? 90 day

## 2017-01-08 NOTE — Telephone Encounter (Signed)
Patient does not have AM dose for today.

## 2017-01-26 ENCOUNTER — Encounter: Payer: Self-pay | Admitting: Endocrinology

## 2017-01-26 ENCOUNTER — Ambulatory Visit (INDEPENDENT_AMBULATORY_CARE_PROVIDER_SITE_OTHER): Payer: Medicare Other | Admitting: Endocrinology

## 2017-01-26 VITALS — BP 130/74 | HR 80 | Ht 65.0 in | Wt 208.0 lb

## 2017-01-26 DIAGNOSIS — N183 Chronic kidney disease, stage 3 unspecified: Secondary | ICD-10-CM

## 2017-01-26 DIAGNOSIS — E1122 Type 2 diabetes mellitus with diabetic chronic kidney disease: Secondary | ICD-10-CM | POA: Diagnosis not present

## 2017-01-26 LAB — POCT GLYCOSYLATED HEMOGLOBIN (HGB A1C): HEMOGLOBIN A1C: 6.3

## 2017-01-26 MED ORDER — REPAGLINIDE 0.5 MG PO TABS: 0.5000 mg | ORAL_TABLET | Freq: Three times a day (TID) | ORAL | 11 refills | Status: DC

## 2017-01-26 MED ORDER — REPAGLINIDE 0.5 MG PO TABS: 0.5000 mg | ORAL_TABLET | Freq: Three times a day (TID) | ORAL | 3 refills | Status: DC

## 2017-01-26 NOTE — Progress Notes (Signed)
Subjective:    Patient ID: [name], female    DOB: 1924/12/08, 83 y.o.   MRN: [id number]  HPI Pt returns for f/u of diabetes mellitus: DM type: 2 Dx'ed: 99991111 Complications: sensory neuropathy, CVA, renal insuff, and CAD.   Therapy: 6 oral meds.   GDM: never. DKA: never. Severe hypoglycemia: never.   Pancreatitis: never.   Other: she is not on metformin, due to CHF, she is not on actos, due to edema; she is off amaryl, due to renal insuff.  Pt says she'll take insulin if necessary;  She lives dtr [name]).   Interval history:  no cbg record, but states cbg's are well-controlled.   Past Medical History:  Diagnosis Date  . Adenomatous colon polyp 07/2002  . Arthritis   . Coronary artery disease    s/p CABG 2001 x 5  . CVA (cerebral vascular accident) [facility]) 2013 or 2014   Dr Evelena Leyden, Neurology  . Dementia   . Diabetes mellitus    Type I  . Diverticulosis   . Erosive esophagitis   . GERD (gastroesophageal reflux disease)   . History of swelling of feet    props up at night, circulations has been checked inlegs in past and was told it was ok  . Hyperlipidemia   . Hypertension   . Hypothyroidism   . Obesity   . Paroxysmal atrial fibrillation (IXL) 02/04/15   discovered on PPM remote interrogation 6/16  . Peptic stricture of esophagus   . Sick sinus syndrome [facility])    s/p Medtronic DDD pacemaker implanted; generator change 02/2012  . Sliding hiatal hernia     Past Surgical History:  Procedure Laterality Date  . APPENDECTOMY    . CATARACT EXTRACTION  05/2005  . CHOLECYSTECTOMY    . COLONOSCOPY W/ POLYPECTOMY  2003  . CORONARY ARTERY BYPASS GRAFT  02/2000   5 vessel  . PACEMAKER PLACEMENT  03/25/2000   by Dr Olevia Perches  . PERMANENT PACEMAKER GENERATOR CHANGE  03/18/2012   PPM gen change by Dr Rayann Heman  . REPLACEMENT TOTAL KNEE  2004   Right  . ROTATOR CUFF REPAIR Right 2009  . TONSILLECTOMY AND ADENOIDECTOMY    . TOTAL ABDOMINAL HYSTERECTOMY W/ BILATERAL  SALPINGOOPHORECTOMY     Endometiosis  . TOTAL KNEE ARTHROPLASTY Left 07/19/2015   Procedure: LEFT TOTAL KNEE ARTHROPLASTY;  Surgeon: Latanya Maudlin, MD;  Location: [facility] ORS;  Service: Orthopedics;  Laterality: Left;    Social History   Social History  . Marital status: Widowed    Spouse name: N/A  . Number of children: 1  . Years of education: N/A   Occupational History  . Retired Retired   Social History Main Topics  . Smoking status: Former Research scientist (life sciences)  . Smokeless tobacco: Former Systems developer    Quit date: 11/23/1981     Comment: quit 28+ yrs ago (as of 2012)  . Alcohol use No  . Drug use: No  . Sexual activity: No   Other Topics Concern  . Not on file   Social History Narrative   Patient lives at home alone. Patient's daughter was with her [name] ) [phone]   Retired.   Education college   Right handed.   Caffeine one cup of coffee daily.   .    Current Outpatient Prescriptions on File Prior to Visit  Medication Sig Dispense Refill  . acarbose (PRECOSE) 50 MG tablet Take 1 tablet (50 mg total) by mouth 2 (two) times daily with a  meal. 60 tablet 11  . acetaminophen (TYLENOL) 500 MG tablet Take 500 mg by mouth every 8 (eight) hours as needed for mild pain. Take 2 tab = 1,000 mg PO Q 8 hours PRN    . apixaban (ELIQUIS) 5 MG TABS tablet Take 1 tablet (5 mg total) by mouth 2 (two) times daily. 180 tablet 0  . atorvastatin (LIPITOR) 20 MG tablet TAKE 1 TABLET BY MOUTH EVERY DAY (TO REPLACE PRAVASTATIN) 90 tablet 2  . bromocriptine (PARLODEL) 2.5 MG tablet TAKE 1 TABLET BY MOUTH AT BEDTIME 30 tablet 10  . Calcium-Vitamin D 600-200 MG-UNIT per tablet Take 1 tablet by mouth daily.    . colesevelam (WELCHOL) 625 MG tablet Take 2 tablets (1,250 mg total) by mouth 3 (three) times daily. 360 tablet 2  . diltiazem (CARDIZEM) 30 MG tablet Take 1 tablet (30 mg total) by mouth every 6 (six) hours as needed. 120 tablet 3  . donepezil (ARICEPT) 10 MG tablet TAKE 1 TABLET (10 MG TOTAL) BY  MOUTH AT BEDTIME. 90 tablet 2  . enalapril (VASOTEC) 20 MG tablet Take 0.5 tablets (10 mg total) by mouth daily. 45 tablet 0  . furosemide (LASIX) 20 MG tablet Take 1 tablet (20 mg total) by mouth daily. 30 tablet 3  . INVOKANA 100 MG TABS tablet TAKE 1 TABLET BY MOUTH EVERY DAY 90 tablet 1  . levothyroxine (SYNTHROID, LEVOTHROID) 25 MCG tablet TAKE 1 AND 1/2 TABLET BY MOUTH EVERY DAY BEFORE BREAKFAST 135 tablet 1  . meclizine (ANTIVERT) 25 MG tablet Take 1 tablet (25 mg total) by mouth 3 (three) times daily as needed for dizziness. 20 tablet 0  . memantine (NAMENDA XR) 28 MG CP24 24 hr capsule Take 1 capsule (28 mg total) by mouth daily. 90 capsule 2  . metoprolol (TOPROL-XL) 200 MG 24 hr tablet TAKE 1 TABLET (200 MG TOTAL) BY MOUTH DAILY. 30 tablet 11  . mirabegron ER (MYRBETRIQ) 25 MG TB24 tablet TAKE 1 TABLET (25 MG TOTAL) BY MOUTH DAILY. 90 tablet 2  . niacin 500 MG tablet Take 500 mg by mouth every evening.     . nitroGLYCERIN (NITROSTAT) 0.3 MG SL tablet Place 1 tablet (0.3 mg total) under the tongue every 5 (five) minutes as needed. For chest pain 90 tablet 3  . nystatin (MYCOSTATIN/NYSTOP) powder Use as directed twice per day as needed 45 g 5  . pantoprazole (PROTONIX) 40 MG tablet TAKE 1 TABLET BY MOUTH EVERY DAY 30 tablet 5  . sertraline (ZOLOFT) 50 MG tablet Take 1 tablet (50 mg total) by mouth daily. 90 tablet 1  . sitaGLIPtin (JANUVIA) 100 MG tablet Take 50 mg by mouth daily. Take half tablet daily     No current facility-administered medications on file prior to visit.     Allergies  Allergen Reactions  . Codeine Rash  . Robaxin [Methocarbamol] Other (See Comments)    Too strong     Family History  Problem Relation Age of Onset  . Colon cancer Mother 75  . Breast cancer Mother   . Transient ischemic attack Mother   . Cancer Mother   . Hypertension Mother   . Stroke Mother   . Diabetes Mother   . Colon cancer Brother 85  . Cancer Brother   . Hypertension Brother     . Heart attack Brother   . Breast cancer Sister   . Cancer Sister   . Diabetes Sister   . Prostate cancer Brother   . Heart  attack Brother   . Heart disease      3 brothers had MI; 1 pre 20  . Hypertension Sister   . Heart attack Daughter   . Diabetes Maternal Aunt     BP 130/74   Pulse 80   Ht 5\' 5"  (1.651 m)   Wt 208 lb (94.3 kg)   SpO2 97%   BMI 34.61 kg/m   Review of Systems She denies hypoglycemia.     Objective:   Physical Exam GENERAL: no distress. VITAL SIGNS:  See vs page SKIN: no ulcer on the feet. feet are of normal color and temp. there is a vein harvest scar on the right leg.  EXTEMITIES: no deformity. no ulcer on the feet.  CV: 1+ bilat leg edema. There are bilat varicosities.  PULSES: dorsalis pedis intact bilat.  NEURO: sensation is intact to touch on the feet, but decreased from normal.  A1c=6.3%    Assessment & Plan:  Type 2 DM, with CAD: overcontrolled. Renal insufficiency: this limits rx options. Frail elderly state: she is not a candidate for aggressive glycemic control.  Patient is advised the following: Patient Instructions  check your blood sugar once a day.  vary the time of day when you check, between before the 3 meals, and at bedtime.  also check if you have symptoms of your blood sugar being too high or too low.  please keep a record of the readings and bring it to your next appointment here.  please call us sooner if your blood sugar goes below 70, or if you have a lot of readings over 200.   Please reduce the repaglinide to 0.5 mg, 3 times a day (just before each meal).  Please continue the same other diabetes medications.  Please come back for a follow-up appointment in 3-4 months.

## 2017-01-26 NOTE — Patient Instructions (Addendum)
check your blood sugar once a day.  vary the time of day when you check, between before the 3 meals, and at bedtime.  also check if you have symptoms of your blood sugar being too high or too low.  please keep a record of the readings and bring it to your next appointment here.  please call us sooner if your blood sugar goes below 70, or if you have a lot of readings over 200.   Please reduce the repaglinide to 0.5 mg, 3 times a day (just before each meal).  Please continue the same other diabetes medications.  Please come back for a follow-up appointment in 3-4 months.

## 2017-01-28 ENCOUNTER — Other Ambulatory Visit: Payer: Self-pay | Admitting: Internal Medicine

## 2017-01-28 NOTE — Telephone Encounter (Signed)
    Assessment & Plan Note by Binnie Rail, MD at 11/03/2016 8:51 PM   Author: Binnie Rail, MD Author Type: Physician Filed: 11/03/2016 8:51 PM  Note Status: Written Cosign: Cosign Not Required Encounter Date: 11/03/2016 8:51 PM  Problem: PND (post-nasal drip)  Editor: Binnie Rail, MD (Physician)    Taking mucinex Can start taking claritin    Assessment & Plan Note by Binnie Rail, MD at 11/03/2016 8:49 PM   Author: Binnie Rail, MD Author Type: Physician Filed: 11/03/2016 8:50 PM  Note Status: Written Cosign: Cosign Not Required Encounter Date: 11/03/2016 8:49 PM  Problem: CKD (chronic kidney disease) stage 3, GFR 30-59 ml/min  Editor: Binnie Rail, MD (Physician)    Decrease enalapril from 20 to 10 - will likely need to d/c if GFR decreases Start lasix 20 mg daily for fluid overload Will likely need to see nephrology cmp next week    Assessment & Plan Note by Binnie Rail, MD at 11/03/2016 8:46 PM   Author: Binnie Rail, MD Author Type: Physician Filed: 11/03/2016 8:48 PM  Note Status: Written Cosign: Cosign Not Required Encounter Date: 11/03/2016 8:46 PM  Problem: EDEMA- LOCALIZED  Editor: Binnie Rail, MD (Physician)    Acute worsening of chronic edema Right anterior leg leaking clear fluid Start lasix 20 mg daily Home health referral for nurse to evaluate her at home cmp next week Low sodium diet Elevate legs     Assessment & Plan Note by Binnie Rail, MD at 11/03/2016 8:45 PM   Author: Binnie Rail, MD Author Type: Physician Filed: 11/03/2016 8:46 PM  Note Status: Written Cosign: Cosign Not Required Encounter Date: 11/03/2016 8:45 PM  Problem: HYPERTENSION, BENIGN  Editor: Binnie Rail, MD (Physician)    BP elevated today Decrease enalapril from 20 mg to 10 mg due to CKD - may need to eliminate Start lasix 20 mg daily Continue metoprolol 200 mg daily, cardizem 30 mg 6 hr prn Follow up in 2 weeks cmp next week    Patient Instructions  by Binnie Rail, MD at 11/03/2016 4:00 PM   Author: Binnie Rail, MD Author Type: Physician Filed: 11/03/2016 4:57 PM  Note Status: Addendum Mickle Mallory: Cosign Not Required Encounter Date: 11/03/2016 4:00 PM  Editor: Binnie Rail, MD (Physician)  Prior Versions: 1. Binnie Rail, MD (Physician) at 11/03/2016 4:55 PM - Signed     Medications reviewed and updated.  Changes include starting lasix 20 mg daily.  Decrease enalapril to 10 mg daily.   Your prescription(s) have been submitted to your pharmacy. Please take as directed and contact our office if you believe you are having problem(s) with the medication(s).    Please followup in 2 weeks +/-

## 2017-01-29 ENCOUNTER — Telehealth: Payer: Self-pay | Admitting: Endocrinology

## 2017-01-29 MED ORDER — SITAGLIPTIN PHOSPHATE 100 MG PO TABS: 50.0000 mg | ORAL_TABLET | Freq: Every day | ORAL | 2 refills | Status: DC

## 2017-01-29 NOTE — Telephone Encounter (Signed)
Refills for januvia need to be called in for a [facility] day supply [facility] [address]  Pt took last pill this am

## 2017-02-02 ENCOUNTER — Ambulatory Visit: Payer: Medicare Other | Admitting: Internal Medicine

## 2017-02-02 DIAGNOSIS — N3281 Overactive bladder: Secondary | ICD-10-CM | POA: Insufficient documentation

## 2017-02-02 NOTE — Assessment & Plan Note (Deleted)
Taking myrbetriq

## 2017-02-02 NOTE — Progress Notes (Deleted)
Subjective:    Patient ID: [name], female    DOB: 05-31-1925, 83 y.o.   MRN: [id number]  HPI The patient is here for follow up.  Diabetes: She is following with Dr Loanne Drilling and just recently saw him.  She is taking her medication daily as prescribed. She is compliant with a diabetic diet. She is exercising regularly. She monitors her sugars and they have been running XXX. She checks her feet daily and denies foot lesions. She is up-to-date with an ophthalmology examination.   CAD, Hypertension: She is taking her medication daily. She is compliant with a low sodium diet.  She denies chest pain, palpitations, edema, shortness of breath and regular headaches. She is exercising regularly.  She does not monitor her blood pressure at home.    GERD:  She is taking her medication daily as prescribed.  She denies any GERD symptoms and feels her GERD is well controlled.   Depression, anxiety: She is taking her sertraline daily as prescribed. She denies any side effects from the medication. She feels her depression and anxiety are well controlled and she is happy with her current dose of medication.     Medications and allergies reviewed with patient and updated if appropriate.  Patient Active Problem List   Diagnosis Date Noted  . CKD (chronic kidney disease) stage 3, GFR 30-59 ml/min 11/03/2016  . PND (post-nasal drip) 11/03/2016  . Anxiety and depression 06/22/2016  . Intertrigo 06/12/2016  . History of total knee arthroplasty 07/19/2015  . At risk for sleep apnea 07/17/2015  . Memory loss 04/05/2014  . Encounter for long-term (current) use of other medications 07/29/2012  . Diabetes ([place]) 04/28/2012  . CVA (cerebral vascular accident) ([place])   . EDEMA- LOCALIZED 01/06/2011  . Chronic systolic heart failure ([place]) 12/26/2010  . GERD 04/30/2010  . TRANSIENT ISCHEMIC ATTACKS, HX OF 04/30/2010  . DYSPEPSIA 04/14/2010  . Generalized weakness 03/19/2010  . CONSTIPATION 02/13/2010  .  ANEMIA, MILD 01/14/2010  . Hyperlipidemia 09/16/2008  . HYPERTENSION, BENIGN 09/16/2008  . CAD, AUTOLOGOUS BYPASS GRAFT 09/16/2008  . SICK SINUS/ TACHY-BRADY SYNDROME 09/16/2008  . PACEMAKER, PERMANENT 09/16/2008  . Coronary atherosclerosis 10/25/2007  . Hypothyroidism 06/14/2007  . COLONIC POLYPS, HX OF 03/23/2007    Current Outpatient Prescriptions on File Prior to Visit  Medication Sig Dispense Refill  . acarbose (PRECOSE) 50 MG tablet Take 1 tablet (50 mg total) by mouth 2 (two) times daily with a meal. 60 tablet 11  . acetaminophen (TYLENOL) 500 MG tablet Take 500 mg by mouth every 8 (eight) hours as needed for mild pain. Take 2 tab = 1,000 mg PO Q 8 hours PRN    . apixaban (ELIQUIS) 5 MG TABS tablet Take 1 tablet (5 mg total) by mouth 2 (two) times daily. 180 tablet 0  . atorvastatin (LIPITOR) 20 MG tablet TAKE 1 TABLET BY MOUTH EVERY DAY (TO REPLACE PRAVASTATIN) 90 tablet 2  . bromocriptine (PARLODEL) 2.5 MG tablet TAKE 1 TABLET BY MOUTH AT BEDTIME 30 tablet 10  . Calcium-Vitamin D 600-200 MG-UNIT per tablet Take 1 tablet by mouth daily.    . colesevelam (WELCHOL) 625 MG tablet Take 2 tablets (1,250 mg total) by mouth 3 (three) times daily. 360 tablet 2  . diltiazem (CARDIZEM) 30 MG tablet Take 1 tablet (30 mg total) by mouth every 6 (six) hours as needed. 120 tablet 3  . donepezil (ARICEPT) 10 MG tablet TAKE 1 TABLET (10 MG TOTAL) BY MOUTH AT BEDTIME.  90 tablet 2  . enalapril (VASOTEC) 20 MG tablet Take 0.5 tablets (10 mg total) by mouth daily. 45 tablet 0  . furosemide (LASIX) 20 MG tablet Take 1 tablet (20 mg total) by mouth daily. 30 tablet 3  . INVOKANA 100 MG TABS tablet TAKE 1 TABLET BY MOUTH EVERY DAY 90 tablet 1  . levothyroxine (SYNTHROID, LEVOTHROID) 25 MCG tablet TAKE 1 AND 1/2 TABLET BY MOUTH EVERY DAY BEFORE BREAKFAST 135 tablet 1  . meclizine (ANTIVERT) 25 MG tablet Take 1 tablet (25 mg total) by mouth 3 (three) times daily as needed for dizziness. 20 tablet 0  .  memantine (NAMENDA XR) 28 MG CP24 24 hr capsule Take 1 capsule (28 mg total) by mouth daily. 90 capsule 2  . metoprolol (TOPROL-XL) 200 MG 24 hr tablet TAKE 1 TABLET (200 MG TOTAL) BY MOUTH DAILY. 30 tablet 11  . mirabegron ER (MYRBETRIQ) 25 MG TB24 tablet TAKE 1 TABLET (25 MG TOTAL) BY MOUTH DAILY. 90 tablet 2  . niacin 500 MG tablet Take 500 mg by mouth every evening.     . nitroGLYCERIN (NITROSTAT) 0.3 MG SL tablet Place 1 tablet (0.3 mg total) under the tongue every 5 (five) minutes as needed. For chest pain 90 tablet 3  . nystatin (MYCOSTATIN/NYSTOP) powder Use as directed twice per day as needed 45 g 5  . pantoprazole (PROTONIX) 40 MG tablet TAKE 1 TABLET BY MOUTH EVERY DAY 30 tablet 5  . repaglinide (PRANDIN) 0.5 MG tablet Take 1 tablet (0.5 mg total) by mouth 3 (three) times daily before meals. 270 tablet 3  . sertraline (ZOLOFT) 50 MG tablet Take 1 tablet (50 mg total) by mouth daily. 90 tablet 1  . sitaGLIPtin (JANUVIA) 100 MG tablet Take 0.5 tablets (50 mg total) by mouth daily. Take half tablet daily 45 tablet 2   No current facility-administered medications on file prior to visit.     Past Medical History:  Diagnosis Date  . Adenomatous colon polyp 07/2002  . Arthritis   . Coronary artery disease    s/p CABG 2001 x 5  . CVA (cerebral vascular accident) [facility]) 2013 or 2014   Dr Evelena Leyden, Neurology  . Dementia   . Diabetes mellitus    Type I  . Diverticulosis   . Erosive esophagitis   . GERD (gastroesophageal reflux disease)   . History of swelling of feet    props up at night, circulations has been checked inlegs in past and was told it was ok  . Hyperlipidemia   . Hypertension   . Hypothyroidism   . Obesity   . Paroxysmal atrial fibrillation ([place]) 02/04/15   discovered on PPM remote interrogation 6/16  . Peptic stricture of esophagus   . Sick sinus syndrome [facility])    s/p Medtronic DDD pacemaker implanted; generator change 02/2012  . Sliding hiatal hernia     Past Surgical  History:  Procedure Laterality Date  . APPENDECTOMY    . CATARACT EXTRACTION  05/2005  . CHOLECYSTECTOMY    . COLONOSCOPY W/ POLYPECTOMY  2003  . CORONARY ARTERY BYPASS GRAFT  02/2000   5 vessel  . PACEMAKER PLACEMENT  03/25/2000   by Dr Olevia Perches  . PERMANENT PACEMAKER GENERATOR CHANGE  03/18/2012   PPM gen change by Dr Rayann Heman  . REPLACEMENT TOTAL KNEE  2004   Right  . ROTATOR CUFF REPAIR Right 2009  . TONSILLECTOMY AND ADENOIDECTOMY    . TOTAL ABDOMINAL HYSTERECTOMY W/ BILATERAL SALPINGOOPHORECTOMY  Endometiosis  . TOTAL KNEE ARTHROPLASTY Left 07/19/2015   Procedure: LEFT TOTAL KNEE ARTHROPLASTY;  Surgeon: Latanya Maudlin, MD;  Location: [facility];  Service: Orthopedics;  Laterality: Left;    Social History   Social History  . Marital status: Widowed    Spouse name: N/A  . Number of children: 1  . Years of education: N/A   Occupational History  . Retired Retired   Social History Main Topics  . Smoking status: Former Research scientist (life sciences)  . Smokeless tobacco: Former Systems developer    Quit date: 11/23/1981     Comment: quit 28+ yrs ago (as of 2012)  . Alcohol use No  . Drug use: No  . Sexual activity: No   Other Topics Concern  . Not on file   Social History Narrative   Patient lives at home alone. Patient's daughter was with her [name] ) [phone]   Retired.   Education college   Right handed.   Caffeine one cup of coffee daily.   .    Family History  Problem Relation Age of Onset  . Colon cancer Mother 31  . Breast cancer Mother   . Transient ischemic attack Mother   . Cancer Mother   . Hypertension Mother   . Stroke Mother   . Diabetes Mother   . Colon cancer Brother 75  . Cancer Brother   . Hypertension Brother   . Heart attack Brother   . Breast cancer Sister   . Cancer Sister   . Diabetes Sister   . Prostate cancer Brother   . Heart attack Brother   . Heart disease      3 brothers had MI; 1 pre 3  . Hypertension Sister   . Heart attack Daughter   . Diabetes  Maternal Aunt     Review of Systems     Objective:  There were no vitals filed for this visit. Wt Readings from Last 3 Encounters:  01/26/17 208 lb (94.3 kg)  12/31/16 204 lb (92.5 kg)  11/25/16 204 lb 9.6 oz (92.8 kg)   There is no height or weight on file to calculate BMI.   Physical Exam    Constitutional: Appears well-developed and well-nourished. No distress.  HENT:  Head: Normocephalic and atraumatic.  Neck: Neck supple. No tracheal deviation present. No thyromegaly present.  No cervical lymphadenopathy Cardiovascular: Normal rate, regular rhythm and normal heart sounds.   No murmur heard. No carotid bruit .  No edema Pulmonary/Chest: Effort normal and breath sounds normal. No respiratory distress. No has no wheezes. No rales.  Skin: Skin is warm and dry. Not diaphoretic.  Psychiatric: Normal mood and affect. Behavior is normal.      Assessment & Plan:    See Problem List for Assessment and Plan of chronic medical problems.

## 2017-02-12 ENCOUNTER — Ambulatory Visit: Payer: Medicare Other | Admitting: Internal Medicine

## 2017-02-12 NOTE — Progress Notes (Deleted)
Subjective:    Patient ID: [name], female    DOB: [birth year], 83 y.o.   MRN: [id number]  HPI The patient is here for follow up.  Memory loss, Bouts of anger, confusion:  We increased her zofolt to 50 mg daily.    CKD:    Hypertension: She is taking her medication daily. She is compliant with a low sodium diet.  She denies chest pain, palpitations, edema, shortness of breath and regular headaches. She is exercising regularly.  She does not monitor her blood pressure at home.    Leg edema:  She is following with Dr Loanne Drilling for her hypothyroidism and diabetes.     Medications and allergies reviewed with patient and updated if appropriate.  Patient Active Problem List   Diagnosis Date Noted  . Overactive bladder 02/02/2017  . CKD (chronic kidney disease) stage 3, GFR 30-59 ml/min 11/03/2016  . PND (post-nasal drip) 11/03/2016  . Anxiety and depression 06/22/2016  . Intertrigo 06/12/2016  . History of total knee arthroplasty 07/19/2015  . At risk for sleep apnea 07/17/2015  . Memory loss 04/05/2014  . Encounter for long-term (current) use of other medications 07/29/2012  . Diabetes ([place]) 04/28/2012  . CVA (cerebral vascular accident) ([place])   . EDEMA- LOCALIZED 01/06/2011  . Chronic systolic heart failure ([place]) 12/26/2010  . GERD 04/30/2010  . TRANSIENT ISCHEMIC ATTACKS, HX OF 04/30/2010  . DYSPEPSIA 04/14/2010  . Generalized weakness 03/19/2010  . CONSTIPATION 02/13/2010  . ANEMIA, MILD 01/14/2010  . Hyperlipidemia 09/16/2008  . HYPERTENSION, BENIGN 09/16/2008  . CAD, AUTOLOGOUS BYPASS GRAFT 09/16/2008  . SICK SINUS/ TACHY-BRADY SYNDROME 09/16/2008  . PACEMAKER, PERMANENT 09/16/2008  . Hypothyroidism 06/14/2007  . COLONIC POLYPS, HX OF 03/23/2007    Current Outpatient Prescriptions on File Prior to Visit  Medication Sig Dispense Refill  . acarbose (PRECOSE) 50 MG tablet Take 1 tablet (50 mg total) by mouth 2 (two) times daily with a meal. 60 tablet 11  .  acetaminophen (TYLENOL) 500 MG tablet Take 500 mg by mouth every 8 (eight) hours as needed for mild pain. Take 2 tab = 1,000 mg PO Q 8 hours PRN    . apixaban (ELIQUIS) 5 MG TABS tablet Take 1 tablet (5 mg total) by mouth 2 (two) times daily. 180 tablet 0  . atorvastatin (LIPITOR) 20 MG tablet TAKE 1 TABLET BY MOUTH EVERY DAY (TO REPLACE PRAVASTATIN) 90 tablet 2  . bromocriptine (PARLODEL) 2.5 MG tablet TAKE 1 TABLET BY MOUTH AT BEDTIME 30 tablet 10  . Calcium-Vitamin D 600-200 MG-UNIT per tablet Take 1 tablet by mouth daily.    . colesevelam (WELCHOL) 625 MG tablet Take 2 tablets (1,250 mg total) by mouth 3 (three) times daily. 360 tablet 2  . diltiazem (CARDIZEM) 30 MG tablet Take 1 tablet (30 mg total) by mouth every 6 (six) hours as needed. 120 tablet 3  . donepezil (ARICEPT) 10 MG tablet TAKE 1 TABLET (10 MG TOTAL) BY MOUTH AT BEDTIME. 90 tablet 2  . enalapril (VASOTEC) 20 MG tablet Take 0.5 tablets (10 mg total) by mouth daily. 45 tablet 0  . furosemide (LASIX) 20 MG tablet Take 1 tablet (20 mg total) by mouth daily. 30 tablet 3  . INVOKANA 100 MG TABS tablet TAKE 1 TABLET BY MOUTH EVERY DAY 90 tablet 1  . levothyroxine (SYNTHROID, LEVOTHROID) 25 MCG tablet TAKE 1 AND 1/2 TABLET BY MOUTH EVERY DAY BEFORE BREAKFAST 135 tablet 1  . meclizine (ANTIVERT) 25 MG tablet  Take 1 tablet (25 mg total) by mouth 3 (three) times daily as needed for dizziness. 20 tablet 0  . memantine (NAMENDA XR) 28 MG CP24 24 hr capsule Take 1 capsule (28 mg total) by mouth daily. 90 capsule 2  . metoprolol (TOPROL-XL) 200 MG 24 hr tablet TAKE 1 TABLET (200 MG TOTAL) BY MOUTH DAILY. 30 tablet 11  . mirabegron ER (MYRBETRIQ) 25 MG TB24 tablet TAKE 1 TABLET (25 MG TOTAL) BY MOUTH DAILY. 90 tablet 2  . niacin 500 MG tablet Take 500 mg by mouth every evening.     . nitroGLYCERIN (NITROSTAT) 0.3 MG SL tablet Place 1 tablet (0.3 mg total) under the tongue every 5 (five) minutes as needed. For chest pain 90 tablet 3  . nystatin  (MYCOSTATIN/NYSTOP) powder Use as directed twice per day as needed 45 g 5  . pantoprazole (PROTONIX) 40 MG tablet TAKE 1 TABLET BY MOUTH EVERY DAY 30 tablet 5  . repaglinide (PRANDIN) 0.5 MG tablet Take 1 tablet (0.5 mg total) by mouth 3 (three) times daily before meals. 270 tablet 3  . sertraline (ZOLOFT) 50 MG tablet Take 1 tablet (50 mg total) by mouth daily. 90 tablet 1  . sitaGLIPtin (JANUVIA) 100 MG tablet Take 0.5 tablets (50 mg total) by mouth daily. Take half tablet daily 45 tablet 2   No current facility-administered medications on file prior to visit.     Past Medical History:  Diagnosis Date  . Adenomatous colon polyp 07/2002  . Arthritis   . Coronary artery disease    s/p CABG 2001 x 5  . CVA (cerebral vascular accident) [facility]) 2013 or 2014   Dr Evelena Leyden, Neurology  . Dementia   . Diabetes mellitus    Type I  . Diverticulosis   . Erosive esophagitis   . GERD (gastroesophageal reflux disease)   . History of swelling of feet    props up at night, circulations has been checked inlegs in past and was told it was ok  . Hyperlipidemia   . Hypertension   . Hypothyroidism   . Obesity   . Paroxysmal atrial fibrillation ([place]) 02/04/15   discovered on PPM remote interrogation 6/16  . Peptic stricture of esophagus   . Sick sinus syndrome [facility])    s/p Medtronic DDD pacemaker implanted; generator change 02/2012  . Sliding hiatal hernia     Past Surgical History:  Procedure Laterality Date  . APPENDECTOMY    . CATARACT EXTRACTION  05/2005  . CHOLECYSTECTOMY    . COLONOSCOPY W/ POLYPECTOMY  2003  . CORONARY ARTERY BYPASS GRAFT  02/2000   5 vessel  . PACEMAKER PLACEMENT  03/25/2000   by Dr Olevia Perches  . PERMANENT PACEMAKER GENERATOR CHANGE  03/18/2012   PPM gen change by Dr Rayann Heman  . REPLACEMENT TOTAL KNEE  2004   Right  . ROTATOR CUFF REPAIR Right 2009  . TONSILLECTOMY AND ADENOIDECTOMY    . TOTAL ABDOMINAL HYSTERECTOMY W/ BILATERAL SALPINGOOPHORECTOMY     Endometiosis  . TOTAL  KNEE ARTHROPLASTY Left 07/19/2015   Procedure: LEFT TOTAL KNEE ARTHROPLASTY;  Surgeon: Latanya Maudlin, MD;  Location: [facility];  Service: Orthopedics;  Laterality: Left;    Social History   Social History  . Marital status: Widowed    Spouse name: N/A  . Number of children: 1  . Years of education: N/A   Occupational History  . Retired Retired   Social History Main Topics  . Smoking status: Former Research scientist (life sciences)  . Smokeless tobacco:  Former Systems developer    Quit date: 11/23/1981     Comment: quit 28+ yrs ago (as of 2012)  . Alcohol use No  . Drug use: No  . Sexual activity: No   Other Topics Concern  . Not on file   Social History Narrative   Patient lives at home alone. Patient's daughter was with her [name] ) [phone]   Retired.   Education college   Right handed.   Caffeine one cup of coffee daily.   .    Family History  Problem Relation Age of Onset  . Colon cancer Mother 59  . Breast cancer Mother   . Transient ischemic attack Mother   . Cancer Mother   . Hypertension Mother   . Stroke Mother   . Diabetes Mother   . Colon cancer Brother 64  . Cancer Brother   . Hypertension Brother   . Heart attack Brother   . Breast cancer Sister   . Cancer Sister   . Diabetes Sister   . Prostate cancer Brother   . Heart attack Brother   . Heart disease      3 brothers had MI; 1 pre 26  . Hypertension Sister   . Heart attack Daughter   . Diabetes Maternal Aunt     Review of Systems     Objective:  There were no vitals filed for this visit. Wt Readings from Last 3 Encounters:  01/26/17 208 lb (94.3 kg)  12/31/16 204 lb (92.5 kg)  11/25/16 204 lb 9.6 oz (92.8 kg)   There is no height or weight on file to calculate BMI.   Physical Exam    Constitutional: Appears well-developed and well-nourished. No distress.  HENT:  Head: Normocephalic and atraumatic.  Neck: Neck supple. No tracheal deviation present. No thyromegaly present.  No cervical  lymphadenopathy Cardiovascular: Normal rate, regular rhythm and normal heart sounds.   No murmur heard. No carotid bruit .  No edema Pulmonary/Chest: Effort normal and breath sounds normal. No respiratory distress. No has no wheezes. No rales.  Skin: Skin is warm and dry. Not diaphoretic.  Psychiatric: Normal mood and affect. Behavior is normal.      Assessment & Plan:    See Problem List for Assessment and Plan of chronic medical problems.

## 2017-02-21 ENCOUNTER — Other Ambulatory Visit: Payer: Self-pay | Admitting: Internal Medicine

## 2017-02-23 ENCOUNTER — Other Ambulatory Visit: Payer: Self-pay | Admitting: *Deleted

## 2017-02-23 ENCOUNTER — Ambulatory Visit (INDEPENDENT_AMBULATORY_CARE_PROVIDER_SITE_OTHER): Payer: Medicare Other | Admitting: *Deleted

## 2017-02-23 DIAGNOSIS — I480 Paroxysmal atrial fibrillation: Secondary | ICD-10-CM

## 2017-02-23 MED ORDER — METOPROLOL SUCCINATE ER 200 MG PO TB24: ORAL_TABLET | ORAL | 2 refills | Status: DC

## 2017-02-24 ENCOUNTER — Telehealth: Payer: Self-pay | Admitting: Cardiology

## 2017-02-24 ENCOUNTER — Ambulatory Visit (INDEPENDENT_AMBULATORY_CARE_PROVIDER_SITE_OTHER): Payer: Medicare Other | Admitting: *Deleted

## 2017-02-24 ENCOUNTER — Other Ambulatory Visit: Payer: Self-pay | Admitting: *Deleted

## 2017-02-24 DIAGNOSIS — I495 Sick sinus syndrome: Secondary | ICD-10-CM

## 2017-02-24 LAB — BASIC METABOLIC PANEL
BUN / CREAT RATIO: 16 (ref 12–28)
BUN: 23 mg/dL (ref 8–27)
CO2: 23 mmol/L (ref 18–29)
Calcium: 9.2 mg/dL (ref 8.7–10.3)
Chloride: 104 mmol/L (ref 96–106)
Creatinine, Ser: 1.44 mg/dL — ABNORMAL HIGH (ref 0.57–1.00)
GFR, EST AFRICAN AMERICAN: 37 mL/min/{1.73_m2} — AB (ref >59)
GFR, EST NON AFRICAN AMERICAN: 32 mL/min/{1.73_m2} — AB (ref >59)
Glucose: 121 mg/dL — ABNORMAL HIGH (ref 65–99)
POTASSIUM: 4.8 mmol/L (ref 3.5–5.2)
SODIUM: 141 mmol/L (ref 134–144)

## 2017-02-24 LAB — CBC
HEMATOCRIT: 34.4 % (ref 34.0–46.6)
Hemoglobin: 11.3 g/dL (ref 11.1–15.9)
MCH: 28.5 pg (ref 26.6–33.0)
MCHC: 32.8 g/dL (ref 31.5–35.7)
MCV: 87 fL (ref 79–97)
Platelets: 166 10*3/uL (ref 150–379)
RBC: 3.96 x10E6/uL (ref 3.77–5.28)
RDW: 14.4 % (ref 12.3–15.4)
WBC: 5 10*3/uL (ref 3.4–10.8)

## 2017-02-24 MED ORDER — APIXABAN 5 MG PO TABS: 5.0000 mg | ORAL_TABLET | Freq: Two times a day (BID) | ORAL | 1 refills | Status: DC

## 2017-02-24 NOTE — Telephone Encounter (Signed)
Confirmed remote transmission w/ pt daughter.   

## 2017-02-24 NOTE — Progress Notes (Signed)
Pt was started on Eliquis for Afib on 05/20/15.   She was  Increased on 12/03/16 to 5mg  due to change in her serum creatine. Reviewed patients medication list.  Pt is not  currently on any combined P-gp and strong CYP3A4 inhibitors/inducers (ketoconazole, traconazole, ritonavir, carbamazepine, phenytoin, rifampin, St. John's wort).  Reviewed labs.  SCr 1.44, Weight 67Kg, Age 83 years old. Dosage is  appropriate based on age, weight, and SCr of 5mg  BID.  Hgb and HCT 11.3/34.4.   A full discussion of the nature of anticoagulants has been carried out with daughter.  A benefit/risk analysis has been presented to the patient, so that they understand the justification for choosing anticoagulation with Eliquis at this time.  The need for compliance is stressed.  Pt is aware to take the medication twice daily.  Side effects of potential bleeding are discussed, including unusual colored urine or stools, coughing up blood or coffee ground emesis, nose bleeds or serious fall or head trauma.  Discussed signs and symptoms of stroke. The patient should avoid any OTC items containing aspirin or ibuprofen.  Avoid alcohol consumption.   Call if any signs of abnormal bleeding.  Discussed financial obligations and resolved any difficulty in obtaining medication.  Next lab test in 6 months on 08/31/17.

## 2017-02-25 ENCOUNTER — Encounter: Payer: Self-pay | Admitting: Cardiology

## 2017-02-25 LAB — CUP PACEART REMOTE DEVICE CHECK
Battery Remaining Longevity: 103 mo
Brady Statistic AS VS Percent: 1 %
Implantable Lead Implant Date: 20010503
Implantable Lead Location: 753859
Implantable Lead Location: 753860
Implantable Lead Model: 5076
Lead Channel Impedance Value: 728 Ohm
Lead Channel Pacing Threshold Amplitude: 1.125 V
Lead Channel Pacing Threshold Pulse Width: 0.4 ms
Lead Channel Setting Pacing Amplitude: 2 V
Lead Channel Setting Pacing Amplitude: 2.5 V
Lead Channel Setting Sensing Sensitivity: 4 mV
MDC IDC LEAD IMPLANT DT: 20010503
MDC IDC MSMT BATTERY IMPEDANCE: 303 Ohm
MDC IDC MSMT BATTERY VOLTAGE: 2.78 V
MDC IDC MSMT LEADCHNL RA IMPEDANCE VALUE: 398 Ohm
MDC IDC MSMT LEADCHNL RA PACING THRESHOLD AMPLITUDE: 0.625 V
MDC IDC MSMT LEADCHNL RV PACING THRESHOLD PULSEWIDTH: 0.4 ms
MDC IDC PG IMPLANT DT: 20130426
MDC IDC SESS DTM: 20180404161523
MDC IDC SET LEADCHNL RV PACING PULSEWIDTH: 0.4 ms
MDC IDC STAT BRADY AP VP PERCENT: 2 %
MDC IDC STAT BRADY AP VS PERCENT: 96 %
MDC IDC STAT BRADY AS VP PERCENT: 0 %

## 2017-02-25 NOTE — Progress Notes (Signed)
Remote pacemaker transmission.   

## 2017-03-08 ENCOUNTER — Ambulatory Visit (INDEPENDENT_AMBULATORY_CARE_PROVIDER_SITE_OTHER): Payer: Medicare Other | Admitting: Internal Medicine

## 2017-03-08 ENCOUNTER — Encounter: Payer: Self-pay | Admitting: Internal Medicine

## 2017-03-08 VITALS — BP 124/70 | HR 78 | Temp 97.8°F | Resp 16 | Wt 211.0 lb

## 2017-03-08 DIAGNOSIS — F32A Depression, unspecified: Secondary | ICD-10-CM

## 2017-03-08 DIAGNOSIS — Z23 Encounter for immunization: Secondary | ICD-10-CM

## 2017-03-08 DIAGNOSIS — E1122 Type 2 diabetes mellitus with diabetic chronic kidney disease: Secondary | ICD-10-CM

## 2017-03-08 DIAGNOSIS — I10 Essential (primary) hypertension: Secondary | ICD-10-CM | POA: Diagnosis not present

## 2017-03-08 DIAGNOSIS — K219 Gastro-esophageal reflux disease without esophagitis: Secondary | ICD-10-CM

## 2017-03-08 DIAGNOSIS — N183 Chronic kidney disease, stage 3 unspecified: Secondary | ICD-10-CM

## 2017-03-08 DIAGNOSIS — F419 Anxiety disorder, unspecified: Secondary | ICD-10-CM

## 2017-03-08 DIAGNOSIS — F329 Major depressive disorder, single episode, unspecified: Secondary | ICD-10-CM

## 2017-03-08 DIAGNOSIS — E039 Hypothyroidism, unspecified: Secondary | ICD-10-CM

## 2017-03-08 MED ORDER — CETIRIZINE HCL 10 MG PO TABS: 10.0000 mg | ORAL_TABLET | Freq: Every day | ORAL | 11 refills | Status: DC

## 2017-03-08 NOTE — Assessment & Plan Note (Addendum)
Lab Results  Component Value Date   HGBA1C 6.3 01/26/2017   Well controlled Management per Dr Loanne Drilling

## 2017-03-08 NOTE — Assessment & Plan Note (Signed)
GERD controlled Continue daily medication  

## 2017-03-08 NOTE — Progress Notes (Signed)
Subjective:    Patient ID: [name], female    DOB: 1925-11-08, 83 y.o.   MRN: [id number]  HPI The patient is here for follow up.  She feels more wobbly than she used to.  She uses the walker daily.  She has a walker with two wheels and wants to go back to her walker with 4 wheels.    Chronic leg edema:  Her leg swelling is stable.  She denies discomfort.  There are no open wounds or weeping from her legs.   CKD:  Her kidney function has been stable - slightly better since last summer.  She recently had blood work done.  Hypertension: She is taking her medication daily. She is compliant with a low sodium diet.  She denies chest pain, palpitations, shortness of breath and regular headaches. She is not exercising regularly.      Diabetes:  She is seeing Dr Loanne Drilling. Her recent a1c was 6.3%.    Depression: She is taking her medication daily as prescribed. We increased her sertraline to 50 mg daily.  She denies any side effects from the medication. She feels her depression is well controlled and she is happy with her current dose of medication.   GERD:  She is taking her medication daily as prescribed.  She denies any GERD symptoms and feels her GERD is well controlled.    Medications and allergies reviewed with patient and updated if appropriate.  Patient Active Problem List   Diagnosis Date Noted  . Overactive bladder 02/02/2017  . CKD (chronic kidney disease) stage 3, GFR 30-59 ml/min 11/03/2016  . PND (post-nasal drip) 11/03/2016  . Anxiety and depression 06/22/2016  . Intertrigo 06/12/2016  . History of total knee arthroplasty 07/19/2015  . At risk for sleep apnea 07/17/2015  . Memory loss 04/05/2014  . Encounter for long-term (current) use of other medications 07/29/2012  . Diabetes ([place]) 04/28/2012  . CVA (cerebral vascular accident) ([place])   . EDEMA- LOCALIZED 01/06/2011  . Chronic systolic heart failure ([place]) 12/26/2010  . GERD 04/30/2010  . TRANSIENT ISCHEMIC  ATTACKS, HX OF 04/30/2010  . DYSPEPSIA 04/14/2010  . Generalized weakness 03/19/2010  . CONSTIPATION 02/13/2010  . ANEMIA, MILD 01/14/2010  . Hyperlipidemia 09/16/2008  . HYPERTENSION, BENIGN 09/16/2008  . CAD, AUTOLOGOUS BYPASS GRAFT 09/16/2008  . SICK SINUS/ TACHY-BRADY SYNDROME 09/16/2008  . PACEMAKER, PERMANENT 09/16/2008  . Hypothyroidism 06/14/2007  . COLONIC POLYPS, HX OF 03/23/2007    Current Outpatient Prescriptions on File Prior to Visit  Medication Sig Dispense Refill  . acarbose (PRECOSE) 50 MG tablet Take 1 tablet (50 mg total) by mouth 2 (two) times daily with a meal. 60 tablet 11  . acetaminophen (TYLENOL) 500 MG tablet Take 500 mg by mouth every 8 (eight) hours as needed for mild pain. Take 2 tab = 1,000 mg PO Q 8 hours PRN    . apixaban (ELIQUIS) 5 MG TABS tablet Take 1 tablet (5 mg total) by mouth 2 (two) times daily. 180 tablet 1  . atorvastatin (LIPITOR) 20 MG tablet TAKE 1 TABLET BY MOUTH EVERY DAY (TO REPLACE PRAVASTATIN) 90 tablet 2  . bromocriptine (PARLODEL) 2.5 MG tablet TAKE 1 TABLET BY MOUTH AT BEDTIME 30 tablet 10  . Calcium-Vitamin D 600-200 MG-UNIT per tablet Take 1 tablet by mouth daily.    . colesevelam (WELCHOL) 625 MG tablet Take 2 tablets (1,250 mg total) by mouth 3 (three) times daily. 360 tablet 2  . diltiazem (CARDIZEM) 30 MG  tablet Take 1 tablet (30 mg total) by mouth every 6 (six) hours as needed. 120 tablet 3  . donepezil (ARICEPT) 10 MG tablet TAKE 1 TABLET (10 MG TOTAL) BY MOUTH AT BEDTIME. 90 tablet 2  . enalapril (VASOTEC) 20 MG tablet Take 0.5 tablets (10 mg total) by mouth daily. 45 tablet 0  . furosemide (LASIX) 20 MG tablet TAKE 1 TABLET (20 MG TOTAL) BY MOUTH DAILY. 30 tablet 0  . INVOKANA 100 MG TABS tablet TAKE 1 TABLET BY MOUTH EVERY DAY 90 tablet 1  . levothyroxine (SYNTHROID, LEVOTHROID) 25 MCG tablet TAKE 1 AND 1/2 TABLET BY MOUTH EVERY DAY BEFORE BREAKFAST 135 tablet 1  . meclizine (ANTIVERT) 25 MG tablet Take 1 tablet (25 mg  total) by mouth 3 (three) times daily as needed for dizziness. 20 tablet 0  . memantine (NAMENDA XR) 28 MG CP24 24 hr capsule Take 1 capsule (28 mg total) by mouth daily. 90 capsule 2  . metoprolol (TOPROL-XL) 200 MG 24 hr tablet TAKE 1 TABLET (200 MG TOTAL) BY MOUTH DAILY. 90 tablet 2  . mirabegron ER (MYRBETRIQ) 25 MG TB24 tablet TAKE 1 TABLET (25 MG TOTAL) BY MOUTH DAILY. 90 tablet 2  . niacin 500 MG tablet Take 500 mg by mouth every evening.     . nitroGLYCERIN (NITROSTAT) 0.3 MG SL tablet Place 1 tablet (0.3 mg total) under the tongue every 5 (five) minutes as needed. For chest pain 90 tablet 3  . nystatin (MYCOSTATIN/NYSTOP) powder Use as directed twice per day as needed 45 g 5  . pantoprazole (PROTONIX) 40 MG tablet TAKE 1 TABLET BY MOUTH EVERY DAY 30 tablet 5  . repaglinide (PRANDIN) 0.5 MG tablet Take 1 tablet (0.5 mg total) by mouth 3 (three) times daily before meals. 270 tablet 3  . sertraline (ZOLOFT) 50 MG tablet Take 1 tablet (50 mg total) by mouth daily. 90 tablet 1  . sitaGLIPtin (JANUVIA) 100 MG tablet Take 0.5 tablets (50 mg total) by mouth daily. Take half tablet daily 45 tablet 2   No current facility-administered medications on file prior to visit.     Past Medical History:  Diagnosis Date  . Adenomatous colon polyp 07/2002  . Arthritis   . Coronary artery disease    s/p CABG 2001 x 5  . CVA (cerebral vascular accident) [facility]) 2013 or 2014   Dr Evelena Leyden, Neurology  . Dementia   . Diabetes mellitus    Type I  . Diverticulosis   . Erosive esophagitis   . GERD (gastroesophageal reflux disease)   . History of swelling of feet    props up at night, circulations has been checked inlegs in past and was told it was ok  . Hyperlipidemia   . Hypertension   . Hypothyroidism   . Obesity   . Paroxysmal atrial fibrillation ([place]) 02/04/15   discovered on PPM remote interrogation 6/16  . Peptic stricture of esophagus   . Sick sinus syndrome [facility])    s/p Medtronic DDD pacemaker  implanted; generator change 02/2012  . Sliding hiatal hernia     Past Surgical History:  Procedure Laterality Date  . APPENDECTOMY    . CATARACT EXTRACTION  05/2005  . CHOLECYSTECTOMY    . COLONOSCOPY W/ POLYPECTOMY  2003  . CORONARY ARTERY BYPASS GRAFT  02/2000   5 vessel  . PACEMAKER PLACEMENT  03/25/2000   by Dr Olevia Perches  . PERMANENT PACEMAKER GENERATOR CHANGE  03/18/2012   PPM gen change by Dr Rayann Heman  .  REPLACEMENT TOTAL KNEE  2004   Right  . ROTATOR CUFF REPAIR Right 2009  . TONSILLECTOMY AND ADENOIDECTOMY    . TOTAL ABDOMINAL HYSTERECTOMY W/ BILATERAL SALPINGOOPHORECTOMY     Endometiosis  . TOTAL KNEE ARTHROPLASTY Left 07/19/2015   Procedure: LEFT TOTAL KNEE ARTHROPLASTY;  Surgeon: Latanya Maudlin, MD;  Location: [facility];  Service: Orthopedics;  Laterality: Left;    Social History   Social History  . Marital status: Widowed    Spouse name: N/A  . Number of children: 1  . Years of education: N/A   Occupational History  . Retired Retired   Social History Main Topics  . Smoking status: Former Research scientist (life sciences)  . Smokeless tobacco: Former Systems developer    Quit date: 11/23/1981     Comment: quit 28+ yrs ago (as of 2012)  . Alcohol use No  . Drug use: No  . Sexual activity: No   Other Topics Concern  . None   Social History Narrative   Patient lives at home alone. Patient's daughter was with her [name] ) [phone]   Retired.   Education college   Right handed.   Caffeine one cup of coffee daily.   .    Family History  Problem Relation Age of Onset  . Colon cancer Mother 40  . Breast cancer Mother   . Transient ischemic attack Mother   . Cancer Mother   . Hypertension Mother   . Stroke Mother   . Diabetes Mother   . Colon cancer Brother 64  . Cancer Brother   . Hypertension Brother   . Heart attack Brother   . Breast cancer Sister   . Cancer Sister   . Diabetes Sister   . Prostate cancer Brother   . Heart attack Brother   . Heart disease      3 brothers had MI;  1 pre 29  . Hypertension Sister   . Heart attack Daughter   . Diabetes Maternal Aunt     Review of Systems  Constitutional: Negative for fever.  HENT: Positive for postnasal drip and rhinorrhea.   Respiratory: Positive for cough (allergy related). Negative for shortness of breath and wheezing.   Cardiovascular: Positive for leg swelling (chronic, stable). Negative for chest pain and palpitations.  Musculoskeletal: Positive for gait problem (uses walker).  Neurological: Positive for dizziness (intermittent). Negative for light-headedness and headaches.       Objective:   Vitals:   03/08/17 1557  BP: 124/70  Pulse: 78  Resp: 16  Temp: 97.8 F (36.6 C)   Wt Readings from Last 3 Encounters:  03/08/17 211 lb (95.7 kg)  01/26/17 208 lb (94.3 kg)  12/31/16 204 lb (92.5 kg)   Body mass index is 35.11 kg/m.   Physical Exam    Constitutional: Appears well-developed and well-nourished. No distress.  HENT:  Head: Normocephalic and atraumatic.  Neck: Neck supple. No tracheal deviation present. No thyromegaly present.  No cervical lymphadenopathy Cardiovascular: Normal rate, regular rhythm and normal heart sounds.   No murmur heard. No carotid bruit .  2+ b/l LE edema Pulmonary/Chest: Effort normal and breath sounds normal. No respiratory distress. No has no wheezes. No rales.  Skin: Skin is warm and dry. Not diaphoretic.  Psychiatric: Normal mood and affect. Behavior is normal.      Assessment & Plan:    See Problem List for Assessment and Plan of chronic medical problems.   FU in 6 months

## 2017-03-08 NOTE — Assessment & Plan Note (Signed)
Controlled, stable Continue current dose of medication  

## 2017-03-08 NOTE — Assessment & Plan Note (Signed)
Stable.       - Continue to monitor

## 2017-03-08 NOTE — Progress Notes (Signed)
Pre visit review using our clinic review tool, if applicable. No additional management support is needed unless otherwise documented below in the visit note. 

## 2017-03-08 NOTE — Assessment & Plan Note (Signed)
Due for tsh - will check with next blood work Clinically euthyroid Continue current medication dose

## 2017-03-08 NOTE — Assessment & Plan Note (Signed)
BP well controlled Current regimen effective and well tolerated Continue current medications at current doses  

## 2017-03-08 NOTE — Patient Instructions (Addendum)
Pneumovax vaccine today.  Medications reviewed and updated.  No changes recommended at this time.     Please followup in 6 months

## 2017-03-09 DIAGNOSIS — Z23 Encounter for immunization: Secondary | ICD-10-CM | POA: Diagnosis not present

## 2017-03-09 NOTE — Addendum Note (Signed)
Addended by: Terence Lux B on: 03/09/2017 07:48 AM   Modules accepted: Orders

## 2017-03-11 ENCOUNTER — Encounter: Payer: Self-pay | Admitting: Cardiology

## 2017-03-22 ENCOUNTER — Other Ambulatory Visit: Payer: Self-pay | Admitting: Internal Medicine

## 2017-03-30 ENCOUNTER — Encounter: Payer: Self-pay | Admitting: Podiatry

## 2017-03-30 ENCOUNTER — Ambulatory Visit (INDEPENDENT_AMBULATORY_CARE_PROVIDER_SITE_OTHER): Payer: Medicare Other | Admitting: Podiatry

## 2017-03-30 DIAGNOSIS — B351 Tinea unguium: Secondary | ICD-10-CM | POA: Diagnosis not present

## 2017-03-30 DIAGNOSIS — M79676 Pain in unspecified toe(s): Secondary | ICD-10-CM

## 2017-03-30 NOTE — Progress Notes (Signed)
Complaint:  Visit Type: Patient returns to my office for continued preventative foot care services. Complaint: Patient states" my nails have grown long and thick and become painful to walk and wear shoes" Patient has been diagnosed with DM with vascular concerns.. The patient presents for preventative foot care services. No changes to ROS  Podiatric Exam: Vascular: dorsalis pedis and posterior tibial pulses are not  palpable bilateral due to excessive foot and ankle swelling. Capillary return is immediate. Temperature gradient is WNL. Skin turgor WNL  Sensorium: Normal Semmes Weinstein monofilament test. Normal tactile sensation bilaterally. Nail Exam: Pt has thick disfigured discolored nails with subungual debris noted bilateral entire nail hallux through fifth toenails.  Pincer hallux nails noted. Ulcer Exam: There is no evidence of ulcer or pre-ulcerative changes or infection. Orthopedic Exam: Muscle tone and strength are WNL. No limitations in general ROM. No crepitus or effusions noted. Foot type and digits show no abnormalities. Bony prominences are unremarkable. Skin: No Porokeratosis. No infection or ulcers  Diagnosis:  Onychomycosis, , Pain in right toe, pain in left toes  Treatment & Plan Procedures and Treatment: Consent by patient was obtained for treatment procedures. The patient understood the discussion of treatment and procedures well. All questions were answered thoroughly reviewed. Debridement of mycotic and hypertrophic toenails, 1 through 5 bilateral and clearing of subungual debris. No ulceration, no infection noted.  Return Visit-Office Procedure: Patient instructed to return to the office for a follow up visit 3 months for continued evaluation and treatment.    Gardiner Barefoot DPM

## 2017-05-20 ENCOUNTER — Other Ambulatory Visit: Payer: Self-pay | Admitting: Endocrinology

## 2017-05-27 ENCOUNTER — Ambulatory Visit (INDEPENDENT_AMBULATORY_CARE_PROVIDER_SITE_OTHER): Payer: Medicare Other | Admitting: *Deleted

## 2017-05-27 ENCOUNTER — Telehealth: Payer: Self-pay | Admitting: Cardiology

## 2017-05-27 DIAGNOSIS — I495 Sick sinus syndrome: Secondary | ICD-10-CM | POA: Diagnosis not present

## 2017-05-27 NOTE — Telephone Encounter (Signed)
Confirmed remote transmission w/ pt wife.   

## 2017-05-28 ENCOUNTER — Other Ambulatory Visit: Payer: Self-pay | Admitting: Endocrinology

## 2017-05-28 LAB — CUP PACEART REMOTE DEVICE CHECK
Battery Remaining Longevity: 100 mo
Battery Voltage: 2.78 V
Brady Statistic AS VP Percent: 0 %
Date Time Interrogation Session: 20180705194102
Implantable Lead Location: 753859
Implantable Lead Location: 753860
Implantable Lead Model: 5076
Implantable Pulse Generator Implant Date: 20130426
Lead Channel Impedance Value: 812 Ohm
Lead Channel Pacing Threshold Pulse Width: 0.4 ms
Lead Channel Pacing Threshold Pulse Width: 0.4 ms
Lead Channel Setting Pacing Amplitude: 2.5 V
Lead Channel Setting Pacing Pulse Width: 0.4 ms
MDC IDC LEAD IMPLANT DT: 20010503
MDC IDC LEAD IMPLANT DT: 20010503
MDC IDC MSMT BATTERY IMPEDANCE: 327 Ohm
MDC IDC MSMT LEADCHNL RA IMPEDANCE VALUE: 384 Ohm
MDC IDC MSMT LEADCHNL RA PACING THRESHOLD AMPLITUDE: 0.625 V
MDC IDC MSMT LEADCHNL RV PACING THRESHOLD AMPLITUDE: 1.25 V
MDC IDC MSMT LEADCHNL RV SENSING INTR AMPL: 11.2 mV
MDC IDC SET LEADCHNL RA PACING AMPLITUDE: 2 V
MDC IDC SET LEADCHNL RV SENSING SENSITIVITY: 5.6 mV
MDC IDC STAT BRADY AP VP PERCENT: 3 %
MDC IDC STAT BRADY AP VS PERCENT: 96 %
MDC IDC STAT BRADY AS VS PERCENT: 1 %

## 2017-05-28 NOTE — Progress Notes (Signed)
Remote pacemaker transmission.   

## 2017-05-31 ENCOUNTER — Ambulatory Visit: Payer: Medicare Other | Admitting: Endocrinology

## 2017-06-01 ENCOUNTER — Telehealth: Payer: Self-pay | Admitting: *Deleted

## 2017-06-01 NOTE — Telephone Encounter (Signed)
Hello, this [name]'s dtr calling. Someone was returning my call where I called about my mother and it hung up. If you could, please call me back at [phone]. Thank you.

## 2017-06-01 NOTE — Telephone Encounter (Signed)
Pt's dtr, [name] states pt has an appt 06/29/2017 with Dr. Prudence Davidson, has a place on the bottom of her foot that looks like a plantar wart, is not painful.06/01/2017-Left message informing [name], if the area is not painful, red, swollen or draining then it would be okay to have pt wait until appt, but if the area became red, swollen, painful or drainage then to make an earlier appt, if red streaking, fever, increasing swelling or drainage go to the ED. Pt's dtr called back and apologized for missing the call, I gave her the information left on her voicemail. [name] described the area as hard raised borders with hard seeds at the plantar base of the big toe. I told her it sounded like a plantar wart and I would make a note on the schedule for Dr. Prudence Davidson to evaluate.

## 2017-06-04 ENCOUNTER — Other Ambulatory Visit: Payer: Self-pay | Admitting: Endocrinology

## 2017-06-04 ENCOUNTER — Encounter: Payer: Self-pay | Admitting: Cardiology

## 2017-06-12 ENCOUNTER — Other Ambulatory Visit: Payer: Self-pay | Admitting: Internal Medicine

## 2017-06-18 ENCOUNTER — Encounter: Payer: Self-pay | Admitting: Cardiology

## 2017-06-21 ENCOUNTER — Other Ambulatory Visit: Payer: Self-pay | Admitting: Internal Medicine

## 2017-06-26 ENCOUNTER — Other Ambulatory Visit: Payer: Self-pay | Admitting: Internal Medicine

## 2017-06-28 ENCOUNTER — Telehealth: Payer: Self-pay | Admitting: Neurology

## 2017-06-28 NOTE — Telephone Encounter (Signed)
Yes, we can do [name], she has appt on Sep 02 2017

## 2017-06-28 NOTE — Telephone Encounter (Signed)
Pt daughter calling re: upcoming appointment.  She is stating that pt is very much still sharp and remembers the line of questioning she is asked when she comes to appointments.  Pt daughter is wanting to know if maybe a different set of questions could be asked.  Daughter is open to a call if needed

## 2017-06-28 NOTE — Telephone Encounter (Signed)
Can we do a moca?  Last mmse 26.

## 2017-06-29 ENCOUNTER — Encounter: Payer: Self-pay | Admitting: Podiatry

## 2017-06-29 ENCOUNTER — Ambulatory Visit (INDEPENDENT_AMBULATORY_CARE_PROVIDER_SITE_OTHER): Payer: Medicare Other | Admitting: Podiatry

## 2017-06-29 DIAGNOSIS — I890 Lymphedema, not elsewhere classified: Secondary | ICD-10-CM

## 2017-06-29 DIAGNOSIS — B351 Tinea unguium: Secondary | ICD-10-CM

## 2017-06-29 DIAGNOSIS — L97521 Non-pressure chronic ulcer of other part of left foot limited to breakdown of skin: Secondary | ICD-10-CM

## 2017-06-29 DIAGNOSIS — E11621 Type 2 diabetes mellitus with foot ulcer: Secondary | ICD-10-CM

## 2017-06-29 DIAGNOSIS — L97511 Non-pressure chronic ulcer of other part of right foot limited to breakdown of skin: Secondary | ICD-10-CM

## 2017-06-29 DIAGNOSIS — M79676 Pain in unspecified toe(s): Secondary | ICD-10-CM

## 2017-06-29 MED ORDER — CEPHALEXIN 500 MG PO CAPS: 500.0000 mg | ORAL_CAPSULE | Freq: Three times a day (TID) | ORAL | 0 refills | Status: DC

## 2017-06-29 NOTE — Progress Notes (Signed)
Complaint:  Visit Type: Patient returns to my office for continued preventative foot care services. Complaint: Patient states" my nails have grown long and thick and become painful to walk and wear shoes" Patient has been diagnosed with DM with vascular concerns.. The patient presents for preventative foot care services. No changes to ROS.  This patient also says she has developed a severely painful callus under the big toe joint, right foot. She says this callus is painful walking and wearing her shoes.  She says her callus has become increasingly painful over the last week.  She denies any drainage from the callus site.  She presents the office today for preventative foot care services and examination of the painful callus.  Podiatric Exam: Vascular: dorsalis pedis and posterior tibial pulses are not  palpable bilateral due to excessive foot and ankle swelling. Capillary return is immediate. Temperature gradient is WNL. Skin turgor WNL  Sensorium: Normal Semmes Weinstein monofilament test. Normal tactile sensation bilaterally. Nail Exam: Pt has thick disfigured discolored nails with subungual debris noted bilateral entire nail hallux through fifth toenails.  Pincer hallux nails noted. Ulcer Exam: There is no evidence of ulcer or pre-ulcerative changes or infection. Orthopedic Exam: Muscle tone and strength are WNL. No limitations in general ROM. No crepitus or effusions noted. Foot type and digits show no abnormalities. Bony prominences are unremarkable. Skin: No Porokeratosis. There is a callus. under the big toe joint of the right foot.  No evidence of any fluctuance, redness or swelling.  Examination of the dorsal aspect of the right foot does reveal increase swelling and redness and increased temperature noted.  There is also redness, swelling and increased temperature up her right leg  Diagnosis:  Onychomycosis, , Pain in right toe, pain in left toes diabetic ulcer right foot.  Abscess right foot.   Diabetic ulcer.  Treatment & Plan Procedures and Treatment: Consent by patient was obtained for treatment procedures. The patient understood the discussion of treatment and procedures well. All questions were answered thoroughly reviewed. Debridement of mycotic and hypertrophic toenails, 1 through 5 bilateral and clearing of subungual debris. No ulceration, no infection noted. Debridement of the callus under the big toe joint reveals a localized abscess with a red, pus draining from a 2 x 2 mm ulcer.  No evidence of any infection noted around this draining abscess. The pus that was present in the diabetic ulcer was sent to the lab for culture and sensitivity.  Return Visit-Office Procedure: . Prescribe cephalexin 500 mg  I  Tid. Home soak instructions given to this patient.  I reinforced to this patient that she may developing a cellulitis in her right lower leg.  She needs to be brought to her medical doctor for an evaluation for possible cellulitis right foot and leg.  Patient to return to the office in one week for further evaluation and treatment of her diabetic ulcer.      Gardiner Barefoot DPM

## 2017-06-29 NOTE — Telephone Encounter (Signed)
I spoke to daughter and she relayed that pt is having hallucinations.  They are friendly not scary one (although did see snake).  She will let us know if things change.  Will do MOCA when in next.  Daughter is aware.

## 2017-06-30 ENCOUNTER — Telehealth: Payer: Self-pay | Admitting: Internal Medicine

## 2017-06-30 NOTE — Telephone Encounter (Signed)
Daughter called stating she wanted to call and make sure Dr. Quay Burow knew the pt is on [facility] 500mg , there was a spot on her leg that looked like Cellulitis and wanted to make sure that is enough medication to heal that.  The daughter Tahra would like a call back

## 2017-07-01 NOTE — Telephone Encounter (Signed)
Yes keflex should take care of that , but it should be monitored closely to make sure.

## 2017-07-01 NOTE — Telephone Encounter (Signed)
Spoke with pts daughter to inform. She will continue the treatment and come into office for OV if necessary.

## 2017-07-06 ENCOUNTER — Other Ambulatory Visit: Payer: Self-pay | Admitting: Internal Medicine

## 2017-07-06 ENCOUNTER — Ambulatory Visit (INDEPENDENT_AMBULATORY_CARE_PROVIDER_SITE_OTHER): Payer: Medicare Other | Admitting: Podiatry

## 2017-07-06 ENCOUNTER — Encounter: Payer: Self-pay | Admitting: Podiatry

## 2017-07-06 DIAGNOSIS — L97511 Non-pressure chronic ulcer of other part of right foot limited to breakdown of skin: Secondary | ICD-10-CM | POA: Diagnosis not present

## 2017-07-06 DIAGNOSIS — E11621 Type 2 diabetes mellitus with foot ulcer: Secondary | ICD-10-CM

## 2017-07-06 NOTE — Progress Notes (Signed)
This patient presents the office with chief complaint of a diabetic ulcer under the ball of her right foot as well as an abscess on the right foot.  The lab report for the culture has not been received.  She presents the office today wearing a bandage applied to the diabetic ulcer right foot  . This patient states that there is still pain and discomfort as she walks, but that her foot is better than last week  . She has been taking cephalexin 500 mg 1 3 times a day for the last week.  She was also instructed to perform home soaks..  Her guardian states that the infection in her foot has healed but it also has helped the redness and the swelling in her right lower leg.  She presents the office today for continued evaluation and treatment of her infected diabetic ulcer   GENERAL APPEARANCE: Alert, conversant. Appropriately groomed. No acute distress.  VASCULAR: Pedal pulses are not   palpable at  [facility] and PT bilateral due to her severe foot swelling.  Capillary refill time is immediate to all digits,  Normal temperature gradient.  Persistent lower leg swelling. NEUROLOGIC: sensation is normal to 5.07 monofilament at 5/5 sites bilateral.  Light touch is intact bilateral, Muscle strength normal.  MUSCULOSKELETAL: acceptable muscle strength, tone and stability bilateral.  Intrinsic muscluature intact bilateral.  Rectus appearance of foot and digits noted bilateral.  NAILS  . Patient has thick disfigured discolored nails with subungual debris noted entire nail HALLUX through fifth toenails. Pincer toenails is noted DERMATOLOGIC: skin color, texture, and turgor are within normal limits except for diabetic ulcer sub 1st MPJ right foot.  Examination of the ulcer with reveals that the ulcer has been covered by hyperkeratotic tissue.  No evidence of any pus or drainage noted  . There is a pinhole opening in the center of the callus at the site of the ulcer  . No evidence of any malodor.    Healing diabetic ulcer right  foot.  ROV.  Examination of the ulcer reveals that the ulcer is healing in closing except for a pinhole opening that has no drainage.  Since this ulcer is closing and healing, I proceeded to apply padding to her shoe in an effort to off weight bear big toe joint, right foot.  Marland Kitchen Upon leaving. She says she was pain free and was amazed at being pain free. RTC 4 weeks.   Gardiner Barefoot DPM

## 2017-07-08 ENCOUNTER — Encounter: Payer: Self-pay | Admitting: Endocrinology

## 2017-07-08 ENCOUNTER — Ambulatory Visit (INDEPENDENT_AMBULATORY_CARE_PROVIDER_SITE_OTHER): Payer: Medicare Other | Admitting: Endocrinology

## 2017-07-08 VITALS — BP 122/60 | HR 67 | Ht 65.0 in | Wt 214.0 lb

## 2017-07-08 DIAGNOSIS — E1122 Type 2 diabetes mellitus with diabetic chronic kidney disease: Secondary | ICD-10-CM | POA: Diagnosis not present

## 2017-07-08 DIAGNOSIS — N183 Chronic kidney disease, stage 3 (moderate): Secondary | ICD-10-CM | POA: Diagnosis not present

## 2017-07-08 LAB — POCT GLYCOSYLATED HEMOGLOBIN (HGB A1C): HEMOGLOBIN A1C: 6.7

## 2017-07-08 MED ORDER — COLESEVELAM HCL 625 MG PO TABS: 1250.0000 mg | ORAL_TABLET | Freq: Every day | ORAL | 3 refills | Status: DC

## 2017-07-08 MED ORDER — BROMOCRIPTINE MESYLATE 2.5 MG PO TABS: 2.5000 mg | ORAL_TABLET | Freq: Every day | ORAL | 3 refills | Status: DC

## 2017-07-08 NOTE — Progress Notes (Signed)
Subjective:    Patient ID: [name], female    DOB: 1925-08-15, 83 y.o.   MRN: [id number]  HPI Pt returns for f/u of diabetes mellitus: DM type: 2 Dx'ed: 5400 Complications: sensory neuropathy, CVA, renal insuff, foot ulcer, and CAD.   Therapy: 6 oral meds.   GDM: never. DKA: never. Severe hypoglycemia: never.   Pancreatitis: never.   Other: she is not on metformin, due to CHF, she is not on actos, due to edema; she is off amaryl, due to renal insuff.  Pt says she'll take insulin if necessary;  She lives dtr [name]).   Interval history:  no cbg record, but states cbg's are in the low-100's.  pt states she feels well in general.  Hx is from dtr.  Pt does not like taking the welchol Past Medical History:  Diagnosis Date  . Adenomatous colon polyp 07/2002  . Arthritis   . Coronary artery disease    s/p CABG 2001 x 5  . CVA (cerebral vascular accident) [facility]) 2013 or 2014   Dr Evelena Leyden, Neurology  . Dementia   . Diabetes mellitus    Type I  . Diverticulosis   . Erosive esophagitis   . GERD (gastroesophageal reflux disease)   . History of swelling of feet    props up at night, circulations has been checked inlegs in past and was told it was ok  . Hyperlipidemia   . Hypertension   . Hypothyroidism   . Obesity   . Paroxysmal atrial fibrillation ([place]) 02/04/15   discovered on PPM remote interrogation 6/16  . Peptic stricture of esophagus   . Sick sinus syndrome [facility])    s/p Medtronic DDD pacemaker implanted; generator change 02/2012  . Sliding hiatal hernia     Past Surgical History:  Procedure Laterality Date  . APPENDECTOMY    . CATARACT EXTRACTION  05/2005  . CHOLECYSTECTOMY    . COLONOSCOPY W/ POLYPECTOMY  2003  . CORONARY ARTERY BYPASS GRAFT  02/2000   5 vessel  . PACEMAKER PLACEMENT  03/25/2000   by Dr Olevia Perches  . PERMANENT PACEMAKER GENERATOR CHANGE  03/18/2012   PPM gen change by Dr Rayann Heman  . REPLACEMENT TOTAL KNEE  2004   Right  . ROTATOR CUFF REPAIR Right  2009  . TONSILLECTOMY AND ADENOIDECTOMY    . TOTAL ABDOMINAL HYSTERECTOMY W/ BILATERAL SALPINGOOPHORECTOMY     Endometiosis  . TOTAL KNEE ARTHROPLASTY Left 07/19/2015   Procedure: LEFT TOTAL KNEE ARTHROPLASTY;  Surgeon: Latanya Maudlin, MD;  Location: [facility];  Service: Orthopedics;  Laterality: Left;    Social History   Social History  . Marital status: Widowed    Spouse name: N/A  . Number of children: 1  . Years of education: N/A   Occupational History  . Retired Retired   Social History Main Topics  . Smoking status: Former Research scientist (life sciences)  . Smokeless tobacco: Former Systems developer    Quit date: 11/23/1981     Comment: quit 28+ yrs ago (as of 2012)  . Alcohol use No  . Drug use: No  . Sexual activity: No   Other Topics Concern  . Not on file   Social History Narrative   Patient lives at home alone. Patient's daughter was with her [name] ) [phone]   Retired.   Education college   Right handed.   Caffeine one cup of coffee daily.   .    Current Outpatient Prescriptions on File Prior to Visit  Medication Sig  Dispense Refill  . acarbose (PRECOSE) 50 MG tablet TAKE 1 TABLET BY MOUTH 3 TIMES A DAY WITH MEALS 90 tablet 3  . acetaminophen (TYLENOL) 500 MG tablet Take 500 mg by mouth every 8 (eight) hours as needed for mild pain. Take 2 tab = 1,000 mg PO Q 8 hours PRN    . apixaban (ELIQUIS) 5 MG TABS tablet Take 1 tablet (5 mg total) by mouth 2 (two) times daily. 180 tablet 1  . atorvastatin (LIPITOR) 20 MG tablet TAKE 1 TABLET BY MOUTH EVERY DAY (TO REPLACE PRAVASTATIN) 90 tablet 0  . Calcium-Vitamin D 600-200 MG-UNIT per tablet Take 1 tablet by mouth daily.    . cephALEXin (KEFLEX) 500 MG capsule Take 1 capsule (500 mg total) by mouth 3 (three) times daily. 20 capsule 0  . cetirizine (ZYRTEC) 10 MG tablet Take 1 tablet (10 mg total) by mouth daily. 30 tablet 11  . diltiazem (CARDIZEM) 30 MG tablet Take 1 tablet (30 mg total) by mouth every 6 (six) hours as needed. 120 tablet 3  .  donepezil (ARICEPT) 10 MG tablet TAKE 1 TABLET BY MOUTH AT BEDTIME 90 tablet 0  . enalapril (VASOTEC) 20 MG tablet Take 1/2 tablet (10 mg) by mouth daily 45 tablet 1  . furosemide (LASIX) 20 MG tablet TAKE 1 TABLET (20 MG TOTAL) BY MOUTH DAILY. 30 tablet 5  . INVOKANA 100 MG TABS tablet TAKE 1 TABLET BY MOUTH EVERY DAY 90 tablet 0  . levothyroxine (SYNTHROID, LEVOTHROID) 25 MCG tablet TAKE 1 AND 1/2 TABLET BY MOUTH EVERY DAY BEFORE BREAKFAST 135 tablet 1  . meclizine (ANTIVERT) 25 MG tablet Take 1 tablet (25 mg total) by mouth 3 (three) times daily as needed for dizziness. 20 tablet 0  . memantine (NAMENDA XR) 28 MG CP24 24 hr capsule TAKE ONE CAPSULE BY MOUTH DAILY 90 capsule 0  . metoprolol (TOPROL-XL) 200 MG 24 hr tablet TAKE 1 TABLET (200 MG TOTAL) BY MOUTH DAILY. 90 tablet 2  . mirabegron ER (MYRBETRIQ) 25 MG TB24 tablet TAKE 1 TABLET (25 MG TOTAL) BY MOUTH DAILY. 90 tablet 2  . niacin 500 MG tablet Take 500 mg by mouth every evening.     . nitroGLYCERIN (NITROSTAT) 0.3 MG SL tablet Place 1 tablet (0.3 mg total) under the tongue every 5 (five) minutes as needed. For chest pain 90 tablet 3  . nystatin (MYCOSTATIN/NYSTOP) powder Use as directed twice per day as needed 45 g 5  . pantoprazole (PROTONIX) 40 MG tablet TAKE 1 TABLET BY MOUTH EVERY DAY 30 tablet 2  . repaglinide (PRANDIN) 0.5 MG tablet Take 1 tablet (0.5 mg total) by mouth 3 (three) times daily before meals. 270 tablet 3  . sertraline (ZOLOFT) 25 MG tablet TAKE 1 TABLET BY MOUTH AT BEDTIME 90 tablet 0  . sertraline (ZOLOFT) 50 MG tablet Take 1 tablet (50 mg total) by mouth daily. 90 tablet 1  . sitaGLIPtin (JANUVIA) 100 MG tablet Take 0.5 tablets (50 mg total) by mouth daily. Take half tablet daily 45 tablet 2   No current facility-administered medications on file prior to visit.     Allergies  Allergen Reactions  . Codeine Rash  . Robaxin [Methocarbamol] Other (See Comments)    Too strong     Family History  Problem  Relation Age of Onset  . Colon cancer Mother 44  . Breast cancer Mother   . Transient ischemic attack Mother   . Cancer Mother   . Hypertension Mother   .  Stroke Mother   . Diabetes Mother   . Colon cancer Brother 15  . Cancer Brother   . Hypertension Brother   . Heart attack Brother   . Breast cancer Sister   . Cancer Sister   . Diabetes Sister   . Prostate cancer Brother   . Heart attack Brother   . Heart disease Unknown        3 brothers had MI; 1 pre 26  . Hypertension Sister   . Heart attack Daughter   . Diabetes Maternal Aunt     BP 122/60   Pulse 67   Ht 5\' 5"  (1.651 m)   Wt 214 lb (97.1 kg)   SpO2 95%   BMI 35.61 kg/m    Review of Systems drt denies pt has hypoglycemia.      Objective:   Physical Exam GENERAL: no distress. VITAL SIGNS:  See vs page SKIN: healingulcer on the plantar aspect of the right foot. feet are of normal color and temp. there is a vein harvest scar on the right leg.  EXTEMITIES: no deformity. no ulcer on the feet.  CV: trace bilat leg edema. There are bilat varicosities.  PULSES: dorsalis pedis intact bilat.  NEURO: sensation is intact to touch on the feet, but decreased from normal.  Lab Results  Component Value Date   CREATININE 1.44 (H) 02/23/2017   BUN 23 02/23/2017   NA 141 02/23/2017   K 4.8 02/23/2017   CL 104 02/23/2017   CO2 23 02/23/2017    Lab Results  Component Value Date   HGBA1C 6.7 07/08/2017      Assessment & Plan:  Type 2 DM: overcontrolled. Frail elderly state.  She is not a candidate for aggressive glycemic control Edema: this limits rx options Renal insuff: this also limits rx options.   Patient Instructions  Please reduce the welchol to once a day (if you take the thyroid pill in the morning, take this in the evening).  Please continue the same other diabetes medications. check your blood sugar once a day.  vary the time of day when you check, between before the 3 meals, and at bedtime.  also  check if you have symptoms of your blood sugar being too high or too low.  please keep a record of the readings and bring it to your next appointment here (or you can bring the meter itself).  You can write it on any piece of paper.  please call us sooner if your blood sugar goes below 70, or if you have a lot of readings over 200. Please come back for a follow-up appointment in 4-6 months.

## 2017-07-08 NOTE — Patient Instructions (Addendum)
Please reduce the welchol to once a day (if you take the thyroid pill in the morning, take this in the evening).  Please continue the same other diabetes medications. check your blood sugar once a day.  vary the time of day when you check, between before the 3 meals, and at bedtime.  also check if you have symptoms of your blood sugar being too high or too low.  please keep a record of the readings and bring it to your next appointment here (or you can bring the meter itself).  You can write it on any piece of paper.  please call us sooner if your blood sugar goes below 70, or if you have a lot of readings over 200. Please come back for a follow-up appointment in 4-6 months.

## 2017-07-20 ENCOUNTER — Other Ambulatory Visit: Payer: Self-pay | Admitting: Internal Medicine

## 2017-07-20 ENCOUNTER — Telehealth: Payer: Self-pay | Admitting: Endocrinology

## 2017-07-20 NOTE — Telephone Encounter (Signed)
Patient's daughter Edd Fabian called to ask if the colesevelam [facility]) 625 MG tablet may be causing the patient's leg to drain slowly. Edd Fabian has tried to call the PCP however, they will not answer. Call [facility] as soon as possible at the number provided to advise.

## 2017-07-21 ENCOUNTER — Telehealth: Payer: Self-pay

## 2017-07-21 NOTE — Telephone Encounter (Signed)
Called and submitted questions to MD.

## 2017-07-21 NOTE — Telephone Encounter (Signed)
Daughter is just wondering if she can increase welchol to 4 tablets daily, to help with the leg.  Or increase Furosamide to two tablets?   Please advise.  Thank you!

## 2017-07-21 NOTE — Telephone Encounter (Signed)
No, that is a separate problem.  Please ask your PCP.

## 2017-07-21 NOTE — Telephone Encounter (Signed)
Notified patient's daughter to contact PCP with any leg issues.

## 2017-07-21 NOTE — Telephone Encounter (Signed)
The welchol is for blood sugar and cholesterol.  Please see PCP for the leg problem

## 2017-07-22 ENCOUNTER — Other Ambulatory Visit: Payer: Self-pay | Admitting: Internal Medicine

## 2017-07-28 ENCOUNTER — Other Ambulatory Visit: Payer: Self-pay | Admitting: Emergency Medicine

## 2017-07-28 MED ORDER — FUROSEMIDE 20 MG PO TABS: 20.0000 mg | ORAL_TABLET | Freq: Every day | ORAL | 0 refills | Status: DC

## 2017-08-04 ENCOUNTER — Ambulatory Visit: Payer: Medicare Other | Admitting: Podiatry

## 2017-08-19 ENCOUNTER — Other Ambulatory Visit: Payer: Self-pay

## 2017-08-19 MED ORDER — ACARBOSE 50 MG PO TABS: ORAL_TABLET | ORAL | 3 refills | Status: DC

## 2017-08-26 ENCOUNTER — Ambulatory Visit (INDEPENDENT_AMBULATORY_CARE_PROVIDER_SITE_OTHER): Payer: Medicare Other | Admitting: *Deleted

## 2017-08-26 ENCOUNTER — Telehealth: Payer: Self-pay | Admitting: Cardiology

## 2017-08-26 DIAGNOSIS — I495 Sick sinus syndrome: Secondary | ICD-10-CM

## 2017-08-26 NOTE — Telephone Encounter (Signed)
Confirmed remote transmission w/ pt daughter.   

## 2017-08-26 NOTE — Progress Notes (Signed)
Remote pacemaker transmission.   

## 2017-08-27 LAB — CUP PACEART REMOTE DEVICE CHECK
Battery Remaining Longevity: 98 mo
Brady Statistic AS VP Percent: 0 %
Date Time Interrogation Session: 20181004152237
Implantable Lead Implant Date: 20010503
Implantable Lead Implant Date: 20010503
Implantable Lead Location: 753859
Implantable Pulse Generator Implant Date: 20130426
Lead Channel Impedance Value: 404 Ohm
Lead Channel Pacing Threshold Amplitude: 1.25 V
Lead Channel Setting Pacing Amplitude: 2 V
Lead Channel Setting Pacing Pulse Width: 0.4 ms
MDC IDC LEAD LOCATION: 753860
MDC IDC MSMT BATTERY IMPEDANCE: 352 Ohm
MDC IDC MSMT BATTERY VOLTAGE: 2.78 V
MDC IDC MSMT LEADCHNL RA PACING THRESHOLD AMPLITUDE: 0.625 V
MDC IDC MSMT LEADCHNL RA PACING THRESHOLD PULSEWIDTH: 0.4 ms
MDC IDC MSMT LEADCHNL RV IMPEDANCE VALUE: 833 Ohm
MDC IDC MSMT LEADCHNL RV PACING THRESHOLD PULSEWIDTH: 0.4 ms
MDC IDC MSMT LEADCHNL RV SENSING INTR AMPL: 11.2 mV
MDC IDC SET LEADCHNL RV PACING AMPLITUDE: 2.5 V
MDC IDC SET LEADCHNL RV SENSING SENSITIVITY: 5.6 mV
MDC IDC STAT BRADY AP VP PERCENT: 3 %
MDC IDC STAT BRADY AP VS PERCENT: 96 %
MDC IDC STAT BRADY AS VS PERCENT: 1 %

## 2017-08-29 ENCOUNTER — Other Ambulatory Visit: Payer: Self-pay | Admitting: Endocrinology

## 2017-09-02 ENCOUNTER — Encounter: Payer: Self-pay | Admitting: Cardiology

## 2017-09-02 ENCOUNTER — Ambulatory Visit: Payer: Medicare Other | Admitting: Neurology

## 2017-09-04 NOTE — Progress Notes (Signed)
Subjective:    Patient ID: [name], female    DOB: July 05, 1925, 83 y.o.   MRN: [id number]  HPI The patient is here for follow up.      Diabetes: She follows with Endo - Dr Loanne Drilling.  She is taking her medication daily as prescribed - Dr Loanne Drilling did decrease her wellchol because her sugars were well controlled.  Her legs swelled up once the dose went down.  Her daughter increased the dose back to 4 pills/day and her swelling improved.  She is compliant with a diabetic diet. She is exercising regularly. She monitors her sugars and they have been running XXX. She checks her feet daily and denies foot lesions. She is up-to-date with an ophthalmology examination.   Hyperlipidemia: She is taking her medication daily. She is compliant with a low fat/cholesterol diet. She is exercising regularly. She denies myalgias.   Hypertension: She is taking her medication daily. She is compliant with a low sodium diet.  She denies chest pain, palpitations, edema, shortness of breath and regular headaches. She is exercising regularly.  She does not monitor her blood pressure at home.    Anxiety, Depression: She is taking her medication daily as prescribed. She denies any side effects from the medication. She feels her depression and anxiety are well controlled and she is happy with her current dose of medication.   GERD:  She is taking her medication daily as prescribed.  She denies any GERD symptoms and feels her GERD is well controlled.   Neuropathy:  She has neuropathy in her feet and legs.  She has intermittent pain in her feet.  She has never been on anything for it.  She feels the pain is tolerable.   Medications and allergies reviewed with patient and updated if appropriate.  Patient Active Problem List   Diagnosis Date Noted  . Overactive bladder 02/02/2017  . CKD (chronic kidney disease) stage 3, GFR 30-59 ml/min (HCC) 11/03/2016  . PND (post-nasal drip) 11/03/2016  . Anxiety and depression  06/22/2016  . History of total knee arthroplasty 07/19/2015  . At risk for sleep apnea 07/17/2015  . Memory loss 04/05/2014  . Encounter for long-term (current) use of other medications 07/29/2012  . Diabetes ([place]) 04/28/2012  . CVA (cerebral vascular accident) ([place])   . EDEMA- LOCALIZED 01/06/2011  . Chronic systolic heart failure ([place]) 12/26/2010  . GERD 04/30/2010  . TRANSIENT ISCHEMIC ATTACKS, HX OF 04/30/2010  . DYSPEPSIA 04/14/2010  . Generalized weakness 03/19/2010  . CONSTIPATION 02/13/2010  . ANEMIA, MILD 01/14/2010  . Hyperlipidemia 09/16/2008  . HYPERTENSION, BENIGN 09/16/2008  . CAD, AUTOLOGOUS BYPASS GRAFT 09/16/2008  . SICK SINUS/ TACHY-BRADY SYNDROME 09/16/2008  . PACEMAKER, PERMANENT 09/16/2008  . Hypothyroidism 06/14/2007  . COLONIC POLYPS, HX OF 03/23/2007    Current Outpatient Prescriptions on File Prior to Visit  Medication Sig Dispense Refill  . acarbose (PRECOSE) 50 MG tablet TAKE 1 TABLET BY MOUTH 3 TIMES A DAY WITH MEALS (Patient taking differently: TAKE 1 TABLET BY MOUTH 2 TIMES A DAY WITH MEALS) 90 tablet 3  . acetaminophen (TYLENOL) 500 MG tablet Take 500 mg by mouth every 8 (eight) hours as needed for mild pain. Take 2 tab = 1,000 mg PO Q 8 hours PRN    . apixaban (ELIQUIS) 5 MG TABS tablet Take 1 tablet (5 mg total) by mouth 2 (two) times daily. 180 tablet 1  . atorvastatin (LIPITOR) 20 MG tablet TAKE 1 TABLET BY MOUTH EVERY DAY (  TO REPLACE PRAVASTATIN) 90 tablet 0  . bromocriptine (PARLODEL) 2.5 MG tablet Take 1 tablet (2.5 mg total) by mouth daily. 90 tablet 3  . Calcium-Vitamin D 600-200 MG-UNIT per tablet Take 1 tablet by mouth daily.    . cetirizine (ZYRTEC) 10 MG tablet Take 1 tablet (10 mg total) by mouth daily. 30 tablet 11  . colesevelam (WELCHOL) 625 MG tablet Take 2 tablets (1,250 mg total) by mouth daily. (Patient taking differently: Take 1,250 mg by mouth 2 (two) times daily with a meal. ) 180 tablet 3  . diltiazem (CARDIZEM) 30 MG tablet  Take 1 tablet (30 mg total) by mouth every 6 (six) hours as needed. 120 tablet 3  . donepezil (ARICEPT) 10 MG tablet TAKE 1 TABLET BY MOUTH AT BEDTIME 90 tablet 0  . enalapril (VASOTEC) 20 MG tablet Take 1/2 tablet (10 mg) by mouth daily 45 tablet 1  . furosemide (LASIX) 20 MG tablet Take 1 tablet (20 mg total) by mouth daily. 90 tablet 0  . INVOKANA 100 MG TABS tablet TAKE 1 TABLET BY MOUTH EVERY DAY 90 tablet 0  . levothyroxine (SYNTHROID, LEVOTHROID) 25 MCG tablet TAKE 1 AND 1/2 TABLET BY MOUTH EVERY DAY BEFORE BREAKFAST 135 tablet 1  . meclizine (ANTIVERT) 25 MG tablet Take 1 tablet (25 mg total) by mouth 3 (three) times daily as needed for dizziness. 20 tablet 0  . memantine (NAMENDA XR) 28 MG CP24 24 hr capsule TAKE ONE CAPSULE BY MOUTH DAILY 90 capsule 0  . metoprolol (TOPROL-XL) 200 MG 24 hr tablet TAKE 1 TABLET (200 MG TOTAL) BY MOUTH DAILY. 90 tablet 2  . MYRBETRIQ 25 MG TB24 tablet TAKE 1 TABLET (25 MG TOTAL) BY MOUTH DAILY. 90 tablet 0  . niacin 500 MG tablet Take 500 mg by mouth every evening.     . nitroGLYCERIN (NITROSTAT) 0.3 MG SL tablet Place 1 tablet (0.3 mg total) under the tongue every 5 (five) minutes as needed. For chest pain 90 tablet 3  . nystatin (MYCOSTATIN/NYSTOP) powder Use as directed twice per day as needed 45 g 5  . pantoprazole (PROTONIX) 40 MG tablet TAKE 1 TABLET BY MOUTH EVERY DAY 30 tablet 2  . repaglinide (PRANDIN) 0.5 MG tablet Take 1 tablet (0.5 mg total) by mouth 3 (three) times daily before meals. 270 tablet 3  . sertraline (ZOLOFT) 50 MG tablet Take 1 tablet (50 mg total) by mouth daily. 90 tablet 1  . sitaGLIPtin (JANUVIA) 100 MG tablet Take 0.5 tablets (50 mg total) by mouth daily. Take half tablet daily 45 tablet 2   No current facility-administered medications on file prior to visit.     Past Medical History:  Diagnosis Date  . Adenomatous colon polyp 07/2002  . Arthritis   . Coronary artery disease    s/p CABG 2001 x 5  . CVA (cerebral  vascular accident) [facility]) 2013 or 2014   Dr Evelena Leyden, Neurology  . Dementia   . Diabetes mellitus    Type I  . Diverticulosis   . Erosive esophagitis   . GERD (gastroesophageal reflux disease)   . History of swelling of feet    props up at night, circulations has been checked inlegs in past and was told it was ok  . Hyperlipidemia   . Hypertension   . Hypothyroidism   . Obesity   . Paroxysmal atrial fibrillation ([place]) 02/04/15   discovered on PPM remote interrogation 6/16  . Peptic stricture of esophagus   . Sick  sinus syndrome ([place])    s/p Medtronic DDD pacemaker implanted; generator change 02/2012  . Sliding hiatal hernia     Past Surgical History:  Procedure Laterality Date  . APPENDECTOMY    . CATARACT EXTRACTION  05/2005  . CHOLECYSTECTOMY    . COLONOSCOPY W/ POLYPECTOMY  2003  . CORONARY ARTERY BYPASS GRAFT  02/2000   5 vessel  . PACEMAKER PLACEMENT  03/25/2000   by Dr Olevia Perches  . PERMANENT PACEMAKER GENERATOR CHANGE  03/18/2012   PPM gen change by Dr Rayann Heman  . REPLACEMENT TOTAL KNEE  2004   Right  . ROTATOR CUFF REPAIR Right 2009  . TONSILLECTOMY AND ADENOIDECTOMY    . TOTAL ABDOMINAL HYSTERECTOMY W/ BILATERAL SALPINGOOPHORECTOMY     Endometiosis  . TOTAL KNEE ARTHROPLASTY Left 07/19/2015   Procedure: LEFT TOTAL KNEE ARTHROPLASTY;  Surgeon: Latanya Maudlin, MD;  Location: [facility];  Service: Orthopedics;  Laterality: Left;    Social History   Social History  . Marital status: Widowed    Spouse name: N/A  . Number of children: 1  . Years of education: N/A   Occupational History  . Retired Retired   Social History Main Topics  . Smoking status: Former Research scientist (life sciences)  . Smokeless tobacco: Former Systems developer    Quit date: 11/23/1981     Comment: quit 28+ yrs ago (as of 2012)  . Alcohol use No  . Drug use: No  . Sexual activity: No   Other Topics Concern  . None   Social History Narrative   Patient lives at home alone. Patient's daughter was with her [name] ) [phone]     Retired.   Education college   Right handed.   Caffeine one cup of coffee daily.   .    Family History  Problem Relation Age of Onset  . Colon cancer Mother 108  . Breast cancer Mother   . Transient ischemic attack Mother   . Cancer Mother   . Hypertension Mother   . Stroke Mother   . Diabetes Mother   . Colon cancer Brother 76  . Cancer Brother   . Hypertension Brother   . Heart attack Brother   . Breast cancer Sister   . Cancer Sister   . Diabetes Sister   . Prostate cancer Brother   . Heart attack Brother   . Heart disease Unknown        3 brothers had MI; 1 pre 72  . Hypertension Sister   . Heart attack Daughter   . Diabetes Maternal Aunt     Review of Systems  Constitutional: Negative for chills and fever.  HENT: Positive for postnasal drip.   Respiratory: Positive for cough (at night). Negative for shortness of breath and wheezing.   Cardiovascular: Positive for palpitations (occasional) and leg swelling (stable). Negative for chest pain.  Gastrointestinal: Negative for constipation and diarrhea.  Endocrine: Positive for cold intolerance.  Skin:       Skin itching       Objective:   Vitals:   09/06/17 1614  BP: 122/60  Pulse: 80  Resp: 16  Temp: 97.7 F (36.5 C)  SpO2: 98%   Wt Readings from Last 3 Encounters:  09/06/17 213 lb (96.6 kg)  07/08/17 214 lb (97.1 kg)  03/08/17 211 lb (95.7 kg)   Body mass index is 35.45 kg/m.   Physical Exam    Constitutional: Appears well-developed and well-nourished. No distress.  HENT:  Head: Normocephalic and atraumatic.  Neck: Neck  supple. No tracheal deviation present. No thyromegaly present.  No cervical lymphadenopathy Cardiovascular: Normal rate, regular rhythm and normal heart sounds.   No murmur heard. No carotid bruit .  No edema Pulmonary/Chest: Effort normal and breath sounds normal. No respiratory distress. No has no wheezes. No rales.  Skin: Skin is warm and dry. Not diaphoretic.   Psychiatric: Normal mood and affect. Behavior is normal.      Assessment & Plan:    See Problem List for Assessment and Plan of chronic medical problems.

## 2017-09-04 NOTE — Patient Instructions (Addendum)

## 2017-09-06 ENCOUNTER — Ambulatory Visit (INDEPENDENT_AMBULATORY_CARE_PROVIDER_SITE_OTHER): Payer: Medicare Other | Admitting: Internal Medicine

## 2017-09-06 ENCOUNTER — Encounter: Payer: Self-pay | Admitting: Internal Medicine

## 2017-09-06 ENCOUNTER — Other Ambulatory Visit (INDEPENDENT_AMBULATORY_CARE_PROVIDER_SITE_OTHER): Payer: Medicare Other

## 2017-09-06 VITALS — BP 122/60 | HR 80 | Temp 97.7°F | Resp 16 | Wt 213.0 lb

## 2017-09-06 DIAGNOSIS — I10 Essential (primary) hypertension: Secondary | ICD-10-CM

## 2017-09-06 DIAGNOSIS — E039 Hypothyroidism, unspecified: Secondary | ICD-10-CM | POA: Diagnosis not present

## 2017-09-06 DIAGNOSIS — E7800 Pure hypercholesterolemia, unspecified: Secondary | ICD-10-CM | POA: Diagnosis not present

## 2017-09-06 DIAGNOSIS — N183 Chronic kidney disease, stage 3 unspecified: Secondary | ICD-10-CM

## 2017-09-06 DIAGNOSIS — F419 Anxiety disorder, unspecified: Secondary | ICD-10-CM

## 2017-09-06 DIAGNOSIS — E1122 Type 2 diabetes mellitus with diabetic chronic kidney disease: Secondary | ICD-10-CM | POA: Diagnosis not present

## 2017-09-06 DIAGNOSIS — Z23 Encounter for immunization: Secondary | ICD-10-CM

## 2017-09-06 DIAGNOSIS — F329 Major depressive disorder, single episode, unspecified: Secondary | ICD-10-CM | POA: Diagnosis not present

## 2017-09-06 DIAGNOSIS — F32A Depression, unspecified: Secondary | ICD-10-CM

## 2017-09-06 DIAGNOSIS — K219 Gastro-esophageal reflux disease without esophagitis: Secondary | ICD-10-CM | POA: Diagnosis not present

## 2017-09-06 DIAGNOSIS — G629 Polyneuropathy, unspecified: Secondary | ICD-10-CM

## 2017-09-06 LAB — CBC WITH DIFFERENTIAL/PLATELET
BASOS ABS: 0.1 10*3/uL (ref 0.0–0.1)
Basophils Relative: 0.9 % (ref 0.0–3.0)
Eosinophils Absolute: 0.2 10*3/uL (ref 0.0–0.7)
Eosinophils Relative: 2.5 % (ref 0.0–5.0)
HCT: 37.7 % (ref 36.0–46.0)
Hemoglobin: 12.3 g/dL (ref 12.0–15.0)
LYMPHS PCT: 33.1 % (ref 12.0–46.0)
Lymphs Abs: 2.3 10*3/uL (ref 0.7–4.0)
MCHC: 32.7 g/dL (ref 30.0–36.0)
MCV: 88.1 fl (ref 78.0–100.0)
MONOS PCT: 9.2 % (ref 3.0–12.0)
Monocytes Absolute: 0.6 10*3/uL (ref 0.1–1.0)
NEUTROS PCT: 54.3 % (ref 43.0–77.0)
Neutro Abs: 3.8 10*3/uL (ref 1.4–7.7)
PLATELETS: 157 10*3/uL (ref 150.0–400.0)
RBC: 4.28 Mil/uL (ref 3.87–5.11)
RDW: 14.3 % (ref 11.5–15.5)
WBC: 7 10*3/uL (ref 4.0–10.5)

## 2017-09-06 LAB — COMPREHENSIVE METABOLIC PANEL
ALBUMIN: 4.2 g/dL (ref 3.5–5.2)
ALK PHOS: 41 U/L (ref 39–117)
ALT: 21 U/L (ref 0–35)
AST: 19 U/L (ref 0–37)
BILIRUBIN TOTAL: 0.4 mg/dL (ref 0.2–1.2)
BUN: 26 mg/dL — ABNORMAL HIGH (ref 6–23)
CALCIUM: 9.6 mg/dL (ref 8.4–10.5)
CO2: 28 meq/L (ref 19–32)
Chloride: 103 mEq/L (ref 96–112)
Creatinine, Ser: 1.71 mg/dL — ABNORMAL HIGH (ref 0.40–1.20)
GFR: 29.82 mL/min — AB (ref >60.00)
Glucose, Bld: 159 mg/dL — ABNORMAL HIGH (ref 70–99)
Potassium: 4.6 mEq/L (ref 3.5–5.1)
Sodium: 138 mEq/L (ref 135–145)
TOTAL PROTEIN: 7.2 g/dL (ref 6.0–8.3)

## 2017-09-06 MED ORDER — COLESEVELAM HCL 625 MG PO TABS: 1250.0000 mg | ORAL_TABLET | Freq: Two times a day (BID) | ORAL | 3 refills | Status: DC

## 2017-09-06 NOTE — Assessment & Plan Note (Signed)
Lipid well controlled Continue statin

## 2017-09-06 NOTE — Assessment & Plan Note (Signed)
Check tsh  Titrate med dose if needed  

## 2017-09-06 NOTE — Assessment & Plan Note (Signed)
B/l feet Intermittent pain Not taking anything now Discussed gabapentin and cymbalta - she did not want to try either now, but will let me know if she wants to try one

## 2017-09-06 NOTE — Assessment & Plan Note (Signed)
BP well controlled Current regimen effective and well tolerated Continue current medications at current doses cmp  

## 2017-09-06 NOTE — Assessment & Plan Note (Signed)
cmp

## 2017-09-06 NOTE — Assessment & Plan Note (Signed)
GERD controlled - requires the medication Continue daily medication

## 2017-09-06 NOTE — Assessment & Plan Note (Signed)
Controlled, stable Continue current dose of medication  

## 2017-09-06 NOTE — Assessment & Plan Note (Signed)
Well controlled Continue current medications at current doses

## 2017-09-07 LAB — TSH: TSH: 4.83 u[IU]/mL — AB (ref 0.35–4.50)

## 2017-09-08 ENCOUNTER — Ambulatory Visit (INDEPENDENT_AMBULATORY_CARE_PROVIDER_SITE_OTHER): Payer: Medicare Other | Admitting: *Deleted

## 2017-09-08 DIAGNOSIS — I4891 Unspecified atrial fibrillation: Secondary | ICD-10-CM

## 2017-09-08 NOTE — Progress Notes (Signed)
Pt was started on Eliquis 2.5mg  q 12 hours  for atrial fib on 05/20/2015 then in creased to Eliquis 5mg  q 12 hours in January 09/2017 due to improved SrCr.    Reviewed patients medication list.  Pt is not currently on any combined P-gp and strong CYP3A4 inhibitors/inducers (ketoconazole, traconazole, ritonavir, carbamazepine, phenytoin, rifampin, St. John's wort).  Reviewed labs.  SCr 1.71, Weight 96.81kg  Dose needs to be changed back to Eliquis 2.5mg  q 12 hours based on age, weight, and SCr.  Hgb 12.3 and HCT 37.7  A full discussion of the nature of anticoagulants has been carried out.  A benefit/risk analysis has been presented to the patient, so that they understand the justification for choosing anticoagulation with Eliquis at this time.  The need for compliance is stressed.  Pt is aware to take the medication twice daily.  Side effects of potential bleeding are discussed, including unusual colored urine or stools, coughing up blood or coffee ground emesis, nose bleeds or serious fall or head trauma.  Discussed signs and symptoms of stroke. The patient should avoid any OTC items containing aspirin or ibuprofen.  Avoid alcohol consumption.   Call if any signs of abnormal bleeding.  Discussed financial obligations and no problems in obtaining medication.  Next lab test in 3 months.  Pt has had no missed doses of Eliquis and no bleeding  Labs done on 09/06/2017 Hgb 12.3 HCT 37.7 SrCr 1.71 Wt on 09/06/2017 96.81 kg  Pt and her daughter instructed to take Eliquis 2.5mg  q 12 hours (1/2 tablet of 5 mg) Daughter verbalized understanding Spoke with [place] PharmD regarding dosing

## 2017-09-10 ENCOUNTER — Telehealth: Payer: Self-pay | Admitting: Internal Medicine

## 2017-09-10 MED ORDER — LEVOTHYROXINE SODIUM 50 MCG PO TABS: 50.0000 ug | ORAL_TABLET | Freq: Every day | ORAL | 1 refills | Status: DC

## 2017-09-10 NOTE — Telephone Encounter (Signed)
RX sent

## 2017-09-10 NOTE — Telephone Encounter (Signed)
Daughter has called back.  I have given her MD's response on labs.  Daughter states that patient is not interested in going to a kidney specialist right now.  States that patient did see the cardiologist yesterday and was informed of creatinine level being 1/71.  States cardiologist decreased dosage of eliquis to 1/2 tab in the am and 1/2 tab in the pm.  Also, please update levothyroxine script in system.

## 2017-09-11 ENCOUNTER — Other Ambulatory Visit: Payer: Self-pay | Admitting: Internal Medicine

## 2017-09-13 ENCOUNTER — Other Ambulatory Visit: Payer: Self-pay | Admitting: Emergency Medicine

## 2017-09-13 MED ORDER — ATORVASTATIN CALCIUM 20 MG PO TABS: ORAL_TABLET | ORAL | 1 refills | Status: DC

## 2017-09-15 ENCOUNTER — Other Ambulatory Visit: Payer: Self-pay | Admitting: Emergency Medicine

## 2017-09-15 MED ORDER — DONEPEZIL HCL 10 MG PO TABS: 10.0000 mg | ORAL_TABLET | Freq: Every day | ORAL | 1 refills | Status: DC

## 2017-09-15 MED ORDER — SERTRALINE HCL 50 MG PO TABS: 50.0000 mg | ORAL_TABLET | Freq: Every day | ORAL | 1 refills | Status: DC

## 2017-09-17 ENCOUNTER — Encounter: Payer: Self-pay | Admitting: Cardiology

## 2017-09-18 ENCOUNTER — Other Ambulatory Visit: Payer: Self-pay | Admitting: Internal Medicine

## 2017-09-19 ENCOUNTER — Other Ambulatory Visit: Payer: Self-pay | Admitting: Internal Medicine

## 2017-09-21 ENCOUNTER — Other Ambulatory Visit: Payer: Self-pay | Admitting: Emergency Medicine

## 2017-09-21 MED ORDER — MEMANTINE HCL ER 28 MG PO CP24: 28.0000 mg | ORAL_CAPSULE | Freq: Every day | ORAL | 1 refills | Status: DC

## 2017-09-30 ENCOUNTER — Other Ambulatory Visit: Payer: Self-pay | Admitting: Internal Medicine

## 2017-09-30 MED ORDER — ENALAPRIL MALEATE 20 MG PO TABS: ORAL_TABLET | ORAL | 0 refills | Status: DC

## 2017-10-07 ENCOUNTER — Other Ambulatory Visit: Payer: Self-pay | Admitting: *Deleted

## 2017-10-07 NOTE — Telephone Encounter (Signed)
Pharmacy requests a ninety day rx. 

## 2017-10-08 MED ORDER — APIXABAN 2.5 MG PO TABS: 2.5000 mg | ORAL_TABLET | Freq: Two times a day (BID) | ORAL | 1 refills | Status: DC

## 2017-10-08 NOTE — Telephone Encounter (Signed)
Eliquis 2.5mg  refill request received; pt is 83 yrs old, wt-96.6kg, Crea-1.71 on 09/06/17, last seen by Chanetta Marshall on 11/25/16. Will send in 90 day supply as requested to requested pharmacy.

## 2017-10-11 ENCOUNTER — Other Ambulatory Visit: Payer: Self-pay | Admitting: Internal Medicine

## 2017-10-19 ENCOUNTER — Other Ambulatory Visit: Payer: Self-pay

## 2017-10-19 ENCOUNTER — Other Ambulatory Visit: Payer: Self-pay | Admitting: Emergency Medicine

## 2017-10-19 MED ORDER — SITAGLIPTIN PHOSPHATE 100 MG PO TABS: 50.0000 mg | ORAL_TABLET | Freq: Every day | ORAL | 2 refills | Status: DC

## 2017-10-19 MED ORDER — MIRABEGRON ER 25 MG PO TB24: ORAL_TABLET | ORAL | 1 refills | Status: DC

## 2017-10-20 ENCOUNTER — Other Ambulatory Visit: Payer: Self-pay | Admitting: Emergency Medicine

## 2017-10-20 MED ORDER — FUROSEMIDE 20 MG PO TABS: 20.0000 mg | ORAL_TABLET | Freq: Every day | ORAL | 3 refills | Status: DC

## 2017-10-26 ENCOUNTER — Ambulatory Visit: Payer: Medicare Other | Admitting: Neurology

## 2017-10-26 ENCOUNTER — Encounter: Payer: Self-pay | Admitting: Neurology

## 2017-10-26 VITALS — BP 114/56 | HR 72 | Ht 65.0 in | Wt 214.5 lb

## 2017-10-26 DIAGNOSIS — R413 Other amnesia: Secondary | ICD-10-CM

## 2017-10-26 MED ORDER — MEMANTINE HCL-DONEPEZIL HCL ER 28-10 MG PO CP24: 1.0000 | ORAL_CAPSULE | Freq: Every day | ORAL | 11 refills | Status: DC

## 2017-10-26 NOTE — Progress Notes (Signed)
[facility]  PATIENT: [name] DOB: [birth year]   HISTORY OF PRESENT ILLNESS: HISTORY [name] 83 year old right-handed Caucasian female, accompanied by her daughter and son-in-law and daughter at today's clinical visit.  Initial evaluation was in May 2015  Patient has past medical history of coronary artery disease, status post bypass, and pacemaker placement for arrhythmia, chronic anticoagulation Eliquis 2.5 mg twice a day, hypertension, diabetes, hyperlipidemia, right knee replacement, baseline mild gait difficulty. she presenting with acute onset left hand weakness and numbness 2 weeks ago,.  She was dining out with her family, suddenly things began to drop off of her left hand, she also noticed left-hand numbness,she couldn't feel things slipping out off her hands.  Since its onset she has slight improvement, but still has intermittent left hand numbness and weakness. She already had some baseline gait difficulty due to her right knee replacement, there is no significant worsening.she denied left facial involvement, no dysarthria. ECHO EF 45-50%, no thrombus, [place] cartoid A: no significant stenosis. CT head showed small vessel disease. No acute lesions. She had 14 years of education, retired as a Optician, dispensing at age 23, took care of her grandchildren afterwards, she now lives alone at her townhouse of more than 20 years.  She is referred back by [name] in June 2013., she reported episodes of confusion, the most recent ones were 3 weeks ago, while driving on a famililar route, she got confused, but she was able to find her way back, also reported difficulty with people's name,word finding difficulties over the past couple years, more obvious now, also had difficulties figuring out her checkbook  She has mild hard of hearing, left worse than right, no significant visual difficulty, repeat CAT scan showed no acute lesion, atrophy, small vessel  disease, small right meningioma, no significant change, ultrasound carotid artery no significant stenosis Laboratory showed normal TSH, elevated creatinine 1.4, A1c 7.8  Last visit was in 06/16/2012: She continued to decline over the past few months, getting more confused, has difficulty managing her medications, increased gait difficulty over past couple weeks, with new left arm, leg weakness, reported most recent ultrasound of carotid arteries showed mild to moderate stenosis, she is taking Plavix, also bromocriptine 2.5 mg twice a day for reasons wasn't sure  She had a gradual onset memory loss, to the point of difficulty handling her medications, sometimes use walker crossing major [place] traffic, worsening gait abnormality due to low back pain, knee pain,  Last clinical visit was with [name] in May 2017, Mini-Mental Status Examination was 27 out of 30  CT head without contrast in May 2017 showed significant generalized atrophy, especially at bilateral perisylvian fissure, left worse than right,  Laboratory evaluation in October 2018 showed normal CBC, slight elevated TSH 4.8, was started on levothyroxine 50 mcg daily since September 10, 2017, elevated creatinine 1.7 1, A1c was 6.3 in May 2017,  B12 was 280 in 2011  She is on Aricept 10 mg daily, Namenda 28 mg daily,  She has moved in with [name] since 2015, she likes to read newspaper and word puzzle, eats well, sleeps well, use walker, but forget to use her walker, she has DM, toes paresthesia and pain, callus,   She complains of worsening memory loss, word finding difficulty, she also report hallucination, see people are watching her, going to call police, see snakes in her bedroom,   Not MRI candidate due to previous pacemaker, recent EKG in 2017 showed prolonged QT interval  of more than 510 ms  REVIEW OF SYSTEMS: Full 14 system review of systems performed and notable only for those listed, all others are neg:  As  above   ALLERGIES: Allergies  Allergen Reactions  . Codeine Rash  . Robaxin [Methocarbamol] Other (See Comments)    Too strong     HOME MEDICATIONS: Outpatient Medications Prior to Visit  Medication Sig Dispense Refill  . acarbose (PRECOSE) 50 MG tablet TAKE 1 TABLET BY MOUTH 3 TIMES A DAY WITH MEALS (Patient taking differently: TAKE 1 TABLET BY MOUTH 2 TIMES A DAY WITH MEALS) 90 tablet 3  . acetaminophen (TYLENOL) 500 MG tablet Take 500 mg by mouth every 8 (eight) hours as needed for mild pain. Take 2 tab = 1,000 mg PO Q 8 hours PRN    . apixaban (ELIQUIS) 2.5 MG TABS tablet Take 1 tablet (2.5 mg total) 2 (two) times daily by mouth. 180 tablet 1  . atorvastatin (LIPITOR) 20 MG tablet TAKE 1 TABLET BY MOUTH EVERY DAY 90 tablet 1  . bromocriptine (PARLODEL) 2.5 MG tablet Take 1 tablet (2.5 mg total) by mouth daily. 90 tablet 3  . Calcium-Vitamin D 600-200 MG-UNIT per tablet Take 1 tablet by mouth daily.    . cetirizine (ZYRTEC) 10 MG tablet Take 1 tablet (10 mg total) by mouth daily. 30 tablet 11  . colesevelam (WELCHOL) 625 MG tablet TAKE 2 TABLETS BY MOUTH 3 TIMES A DAY 360 tablet 2  . diltiazem (CARDIZEM) 30 MG tablet Take 1 tablet (30 mg total) by mouth every 6 (six) hours as needed. 120 tablet 3  . donepezil (ARICEPT) 10 MG tablet Take 1 tablet (10 mg total) by mouth at bedtime. 90 tablet 1  . enalapril (VASOTEC) 20 MG tablet Take 1/2 tablet (10 mg) by mouth daily. Please make appt with Dr. Rayann Heman for January before anymore refills. 1st attempt 15 tablet 0  . furosemide (LASIX) 20 MG tablet Take 1 tablet (20 mg total) by mouth daily. 90 tablet 3  . INVOKANA 100 MG TABS tablet TAKE 1 TABLET BY MOUTH EVERY DAY 90 tablet 0  . levothyroxine (SYNTHROID, LEVOTHROID) 50 MCG tablet Take 1 tablet (50 mcg total) by mouth daily. 90 tablet 1  . Loperamide HCl (ANTI-DIARRHEAL PO) Take by mouth daily.    . meclizine (ANTIVERT) 25 MG tablet Take 1 tablet (25 mg total) by mouth 3 (three) times daily  as needed for dizziness. 20 tablet 0  . memantine (NAMENDA XR) 28 MG CP24 24 hr capsule Take 1 capsule (28 mg total) by mouth daily. 90 capsule 1  . metoprolol (TOPROL-XL) 200 MG 24 hr tablet TAKE 1 TABLET (200 MG TOTAL) BY MOUTH DAILY. 90 tablet 2  . mirabegron ER (MYRBETRIQ) 25 MG TB24 tablet TAKE 1 TABLET (25 MG TOTAL) BY MOUTH DAILY. 90 tablet 1  . niacin 500 MG tablet Take 500 mg by mouth every evening.     . nitroGLYCERIN (NITROSTAT) 0.3 MG SL tablet Place 1 tablet (0.3 mg total) under the tongue every 5 (five) minutes as needed. For chest pain 90 tablet 3  . nystatin (MYCOSTATIN/NYSTOP) powder Use as directed twice per day as needed 45 g 5  . pantoprazole (PROTONIX) 40 MG tablet TAKE 1 TABLET BY MOUTH EVERY DAY 30 tablet 5  . repaglinide (PRANDIN) 0.5 MG tablet Take 1 tablet (0.5 mg total) by mouth 3 (three) times daily before meals. 270 tablet 3  . sertraline (ZOLOFT) 50 MG tablet Take 1 tablet (50 mg  total) by mouth daily. 90 tablet 1  . sitaGLIPtin (JANUVIA) 100 MG tablet Take 0.5 tablets (50 mg total) by mouth daily. Take half tablet daily 45 tablet 2  . colesevelam (WELCHOL) 625 MG tablet Take 2 tablets (1,250 mg total) by mouth 2 (two) times daily with a meal. 360 tablet 3   No facility-administered medications prior to visit.     PAST MEDICAL HISTORY: Past Medical History:  Diagnosis Date  . Adenomatous colon polyp 07/2002  . Arthritis   . Coronary artery disease    s/p CABG 2001 x 5  . CVA (cerebral vascular accident) [facility]) 2013 or 2014   Dr Evelena Leyden, Neurology  . Dementia   . Diabetes mellitus    Type I  . Diverticulosis   . Erosive esophagitis   . GERD (gastroesophageal reflux disease)   . History of swelling of feet    props up at night, circulations has been checked inlegs in past and was told it was ok  . Hyperlipidemia   . Hypertension   . Hypothyroidism   . Obesity   . Paroxysmal atrial fibrillation ([place]) 02/04/15   discovered on PPM remote interrogation 6/16  .  Peptic stricture of esophagus   . Sick sinus syndrome [facility])    s/p Medtronic DDD pacemaker implanted; generator change 02/2012  . Sliding hiatal hernia     PAST SURGICAL HISTORY: Past Surgical History:  Procedure Laterality Date  . APPENDECTOMY    . CATARACT EXTRACTION  05/2005  . CHOLECYSTECTOMY    . COLONOSCOPY W/ POLYPECTOMY  2003  . CORONARY ARTERY BYPASS GRAFT  02/2000   5 vessel  . PACEMAKER PLACEMENT  03/25/2000   by Dr Olevia Perches  . PERMANENT PACEMAKER GENERATOR CHANGE  03/18/2012   PPM gen change by Dr Rayann Heman  . REPLACEMENT TOTAL KNEE  2004   Right  . ROTATOR CUFF REPAIR Right 2009  . TONSILLECTOMY AND ADENOIDECTOMY    . TOTAL ABDOMINAL HYSTERECTOMY W/ BILATERAL SALPINGOOPHORECTOMY     Endometiosis  . TOTAL KNEE ARTHROPLASTY Left 07/19/2015   Procedure: LEFT TOTAL KNEE ARTHROPLASTY;  Surgeon: Latanya Maudlin, MD;  Location: [facility];  Service: Orthopedics;  Laterality: Left;    FAMILY HISTORY: Family History  Problem Relation Age of Onset  . Colon cancer Mother 61  . Breast cancer Mother   . Transient ischemic attack Mother   . Cancer Mother   . Hypertension Mother   . Stroke Mother   . Diabetes Mother   . Colon cancer Brother 24  . Cancer Brother   . Hypertension Brother   . Heart attack Brother   . Breast cancer Sister   . Cancer Sister   . Diabetes Sister   . Prostate cancer Brother   . Heart attack Brother   . Heart disease Unknown        3 brothers had MI; 1 pre 48  . Hypertension Sister   . Heart attack Daughter   . Diabetes Maternal Aunt     SOCIAL HISTORY: Social History   Socioeconomic History  . Marital status: Widowed    Spouse name: Not on file  . Number of children: 1  . Years of education: Not on file  . Highest education level: Not on file  Social Needs  . Financial resource strain: Not on file  . Food insecurity - worry: Not on file  . Food insecurity - inability: Not on file  . Transportation needs - medical: Not on file  .  Transportation needs -  non-medical: Not on file  Occupational History  . Occupation: Retired    Fish farm manager: RETIRED  Tobacco Use  . Smoking status: Former Research scientist (life sciences)  . Smokeless tobacco: Former Systems developer    Quit date: 11/23/1981  . Tobacco comment: quit 28+ yrs ago (as of 2012)  Substance and Sexual Activity  . Alcohol use: No  . Drug use: No  . Sexual activity: No  Other Topics Concern  . Not on file  Social History Narrative   Patient lives at home alone. Patient's daughter was with her [name] ) [phone]   Retired.   Education college   Right handed.   Caffeine one cup of coffee daily.   Marland Kitchen     PHYSICAL EXAM  Vitals:   10/26/17 1009  BP: (!) 114/56  Pulse: 72  Weight: 214 lb 8 oz (97.3 kg)  Height: '5\' 5"'$  (1.651 m)   Body mass index is 35.69 kg/m.  MMSE - Mini Mental State Exam 10/26/2017 04/09/2016  Orientation to time 5 3  Orientation to Place 5 5  Registration 3 3  Attention/ Calculation 4 5  Recall 3 2  Language- name 2 objects 2 2  Language- repeat 0 0  Language- follow 3 step command 3 3  Language- read & follow direction 1 1  Write a sentence 1 1  Copy design 0 0  Total score 27 25   Animal naming 9 PHYSICAL EXAMNIATION:  Gen: NAD, conversant, well nourised, obese, well groomed                     Cardiovascular: Regular rate rhythm, no peripheral edema, warm, nontender. Eyes: Conjunctivae clear without exudates or hemorrhage Neck: Supple, no carotid bruits. Pulmonary: Clear to auscultation bilaterally   NEUROLOGICAL EXAM:   CRANIAL NERVES: CN II: Visual fields are full to confrontation. Fundoscopic exam is normal with sharp discs and no vascular changes. Pupils are round equal and briskly reactive to light. CN III, IV, VI: extraocular movement are normal. No ptosis. CN V: Facial sensation is intact to pinprick in all 3 divisions bilaterally. Corneal responses are intact.  CN VII: Face is symmetric with normal eye closure and smile. CN VIII:  Hearing is normal to rubbing fingers CN IX, X: Palate elevates symmetrically. Phonation is normal. CN XI: Head turning and shoulder shrug are intact CN XII: Tongue is midline with normal movements and no atrophy.  MOTOR: There is no pronator drift of out-stretched arms. Muscle bulk and tone are normal. Muscle strength is normal.  REFLEXES: Reflexes are hypoactive and symmetric,  SENSORY: Bilateral lower extremity edema, length dependent sensory changes,  COORDINATION: Rapid alternating movements and fine finger movements are intact. There is no dysmetria on finger-to-nose and heel-knee-shin.    GAIT/STANCE: She needs push-up to getup from seated position, rely on her walker, small stride, cautious, unsteady Romberg is absent.    DIAGNOSTIC DATA (LABS, IMAGING, TESTING) - I reviewed patient records, labs, notes, testing and imaging myself where available.  Lab Results  Component Value Date   WBC 7.0 09/06/2017   HGB 12.3 09/06/2017   HCT 37.7 09/06/2017   MCV 88.1 09/06/2017   PLT 157.0 09/06/2017      Component Value Date/Time   NA 138 09/06/2017 1700   NA 141 02/23/2017 1233   K 4.6 09/06/2017 1700   CL 103 09/06/2017 1700   CO2 28 09/06/2017 1700   GLUCOSE 159 (H) 09/06/2017 1700   BUN 26 (H) 09/06/2017 1700   BUN  23 02/23/2017 1233   CREATININE 1.71 (H) 09/06/2017 1700   CREATININE 1.69 (H) 06/09/2016 1639   CALCIUM 9.6 09/06/2017 1700   PROT 7.2 09/06/2017 1700   ALBUMIN 4.2 09/06/2017 1700   AST 19 09/06/2017 1700   ALT 21 09/06/2017 1700   ALKPHOS 41 09/06/2017 1700   BILITOT 0.4 09/06/2017 1700   GFRNONAA 32 (L) 02/23/2017 1233   GFRAA 37 (L) 02/23/2017 1233    Lab Results  Component Value Date   HGBA1C 6.7 07/08/2017     ASSESSMENT AND PLAN  83 y.o. year old female  Dementia with visual hallucinations  Progressive worsening memory loss, tolerating Namenda xr 28 mg, Aricept 10 mg daily,  History of pacemaker, not MRI candidate, prolonged QT  interval on previous EKG,  Overall doing well on current Zoloft 20 mg daily  Laboratory evaluations B12, C-reactive protein and ESR to rule out treatable etiology   Try namzaric 28-'10mg'$  daily  Marcial Pacas, M.D. Ph.D.  [facility] [place], [place] [address] Phone: [phone] Fax:      [phone]

## 2017-10-27 LAB — SEDIMENTATION RATE: Sed Rate: 13 mm/hr (ref 0–40)

## 2017-10-27 LAB — VITAMIN B12: VITAMIN B 12: 356 pg/mL (ref 232–1245)

## 2017-10-27 LAB — C-REACTIVE PROTEIN: CRP: <0.3 mg/L (ref 0.0–4.9)

## 2017-11-02 ENCOUNTER — Other Ambulatory Visit: Payer: Self-pay | Admitting: Endocrinology

## 2017-11-02 ENCOUNTER — Other Ambulatory Visit: Payer: Self-pay | Admitting: Internal Medicine

## 2017-11-04 ENCOUNTER — Telehealth: Payer: Self-pay | Admitting: Internal Medicine

## 2017-11-04 NOTE — Telephone Encounter (Signed)
Copied from [place]. Topic: Quick Communication - See Telephone Encounter >> Nov 04, 2017  4:26 PM Ether Griffins B wrote: CRM for notification. See Telephone encounter for:  Dr. Rayann Heman was turning over enalapril to Dr. Quay Burow pt will be out of it after next Tuesday. Is Dr. Quay Burow going to continue her on this medication if so can it be refilled for 90 days.  11/04/17.

## 2017-11-05 MED ORDER — ENALAPRIL MALEATE 20 MG PO TABS: ORAL_TABLET | ORAL | 1 refills | Status: DC

## 2017-11-05 NOTE — Telephone Encounter (Signed)
Ok to send

## 2017-11-05 NOTE — Telephone Encounter (Signed)
Is this okay to send in under your name?

## 2017-11-09 ENCOUNTER — Other Ambulatory Visit: Payer: Self-pay | Admitting: Internal Medicine

## 2017-11-09 MED ORDER — METOPROLOL SUCCINATE ER 200 MG PO TB24: ORAL_TABLET | ORAL | 0 refills | Status: DC

## 2017-11-09 NOTE — Telephone Encounter (Signed)
Medication Detail    Disp Refills Start End   metoprolol (TOPROL-XL) 200 MG 24 hr tablet 30 tablet 0 11/09/2017    Sig: TAKE 1 TABLET (200 MG TOTAL) BY MOUTH DAILY. Please make yearly appt for January with Dr. Rayann Heman. 1st attempt   Sent to pharmacy as: metoprolol (TOPROL-XL) 200 MG 24 hr tablet   Notes to Pharmacy: Please call our office to schedule an yearly appointment with Dr. Rayann Heman for January before anymore refills. [phone]. Thank you 1st attempt   E-Prescribing Status: Receipt confirmed by pharmacy (11/09/2017 12:23 PM EST)   Pharmacy   [id number] - [place], [place] - [address]

## 2017-11-11 ENCOUNTER — Other Ambulatory Visit: Payer: Self-pay

## 2017-11-11 MED ORDER — ACARBOSE 50 MG PO TABS: ORAL_TABLET | ORAL | 0 refills | Status: DC

## 2017-11-19 ENCOUNTER — Telehealth: Payer: Self-pay | Admitting: Neurology

## 2017-11-19 NOTE — Telephone Encounter (Signed)
Pt daughter(on DPR) has called, she said Dr Krista Blue wanted her to call to inform if she wanted pt to remain on the Memantine HCl-Donepezil HCl (NAMZARIC) 28-10 MG CP24, daughter states that yes she would.  Daughter is asking for a 90 day supply to be called into  [id number] [address], [place] [phone] (Phone) [phone] (Fax)   Daughter is not asking for a call back, but welcomes one if there are questions re: her request.

## 2017-11-24 ENCOUNTER — Other Ambulatory Visit: Payer: Self-pay

## 2017-11-24 MED ORDER — MEMANTINE HCL-DONEPEZIL HCL ER 28-10 MG PO CP24: 1.0000 | ORAL_CAPSULE | Freq: Every day | ORAL | 3 refills | Status: DC

## 2017-11-24 MED ORDER — ACARBOSE 50 MG PO TABS: ORAL_TABLET | ORAL | 0 refills | Status: DC

## 2017-11-24 NOTE — Telephone Encounter (Signed)
Namzaric 28-10 mg changed to 90 day supply with 3 refills, [facility] [address] as daughter requested.

## 2017-11-24 NOTE — Addendum Note (Signed)
Addended by: Minna Antis on: 11/24/2017 09:14 AM   Modules accepted: Orders

## 2017-11-25 ENCOUNTER — Ambulatory Visit (INDEPENDENT_AMBULATORY_CARE_PROVIDER_SITE_OTHER): Payer: Medicare Other | Admitting: *Deleted

## 2017-11-25 ENCOUNTER — Telehealth: Payer: Self-pay | Admitting: Cardiology

## 2017-11-25 DIAGNOSIS — I495 Sick sinus syndrome: Secondary | ICD-10-CM

## 2017-11-25 NOTE — Telephone Encounter (Signed)
Confirmed remote transmission w/ pt daughter.   

## 2017-11-25 NOTE — Progress Notes (Signed)
Remote pacemaker transmission.   

## 2017-11-26 ENCOUNTER — Encounter: Payer: Self-pay | Admitting: Cardiology

## 2017-11-28 ENCOUNTER — Other Ambulatory Visit: Payer: Self-pay | Admitting: Endocrinology

## 2017-11-30 ENCOUNTER — Ambulatory Visit: Payer: Self-pay

## 2017-11-30 ENCOUNTER — Emergency Department (HOSPITAL_BASED_OUTPATIENT_CLINIC_OR_DEPARTMENT_OTHER): Payer: Medicare Other

## 2017-11-30 ENCOUNTER — Other Ambulatory Visit: Payer: Self-pay

## 2017-11-30 ENCOUNTER — Emergency Department (HOSPITAL_BASED_OUTPATIENT_CLINIC_OR_DEPARTMENT_OTHER)
Admission: EM | Admit: 2017-11-30 | Discharge: 2017-11-30 | Disposition: A | Discharge status: Home / Self Care | Payer: Medicare Other | Attending: Emergency Medicine | Admitting: Emergency Medicine

## 2017-11-30 ENCOUNTER — Encounter (HOSPITAL_BASED_OUTPATIENT_CLINIC_OR_DEPARTMENT_OTHER): Payer: Self-pay

## 2017-11-30 DIAGNOSIS — E1022 Type 1 diabetes mellitus with diabetic chronic kidney disease: Secondary | ICD-10-CM | POA: Diagnosis not present

## 2017-11-30 DIAGNOSIS — L03115 Cellulitis of right lower limb: Secondary | ICD-10-CM

## 2017-11-30 DIAGNOSIS — R441 Visual hallucinations: Secondary | ICD-10-CM | POA: Insufficient documentation

## 2017-11-30 DIAGNOSIS — E039 Hypothyroidism, unspecified: Secondary | ICD-10-CM | POA: Insufficient documentation

## 2017-11-30 DIAGNOSIS — I5022 Chronic systolic (congestive) heart failure: Secondary | ICD-10-CM | POA: Diagnosis not present

## 2017-11-30 DIAGNOSIS — Z951 Presence of aortocoronary bypass graft: Secondary | ICD-10-CM | POA: Insufficient documentation

## 2017-11-30 DIAGNOSIS — Z79899 Other long term (current) drug therapy: Secondary | ICD-10-CM | POA: Diagnosis not present

## 2017-11-30 DIAGNOSIS — F0390 Unspecified dementia without behavioral disturbance: Secondary | ICD-10-CM | POA: Diagnosis not present

## 2017-11-30 DIAGNOSIS — R410 Disorientation, unspecified: Secondary | ICD-10-CM | POA: Diagnosis present

## 2017-11-30 DIAGNOSIS — I130 Hypertensive heart and chronic kidney disease with heart failure and stage 1 through stage 4 chronic kidney disease, or unspecified chronic kidney disease: Secondary | ICD-10-CM | POA: Insufficient documentation

## 2017-11-30 DIAGNOSIS — Z87891 Personal history of nicotine dependence: Secondary | ICD-10-CM | POA: Insufficient documentation

## 2017-11-30 DIAGNOSIS — I2510 Atherosclerotic heart disease of native coronary artery without angina pectoris: Secondary | ICD-10-CM | POA: Insufficient documentation

## 2017-11-30 DIAGNOSIS — N183 Chronic kidney disease, stage 3 (moderate): Secondary | ICD-10-CM | POA: Insufficient documentation

## 2017-11-30 DIAGNOSIS — Z7901 Long term (current) use of anticoagulants: Secondary | ICD-10-CM | POA: Insufficient documentation

## 2017-11-30 DIAGNOSIS — R600 Localized edema: Secondary | ICD-10-CM

## 2017-11-30 LAB — COMPREHENSIVE METABOLIC PANEL
ALK PHOS: 46 U/L (ref 38–126)
ALT: 27 U/L (ref 14–54)
ANION GAP: 7 (ref 5–15)
AST: 30 U/L (ref 15–41)
Albumin: 3.8 g/dL (ref 3.5–5.0)
BILIRUBIN TOTAL: 0.6 mg/dL (ref 0.3–1.2)
BUN: 41 mg/dL — ABNORMAL HIGH (ref 6–20)
CALCIUM: 9.3 mg/dL (ref 8.9–10.3)
CO2: 23 mmol/L (ref 22–32)
Chloride: 108 mmol/L (ref 101–111)
Creatinine, Ser: 1.99 mg/dL — ABNORMAL HIGH (ref 0.44–1.00)
GFR, EST AFRICAN AMERICAN: 24 mL/min — AB (ref >60)
GFR, EST NON AFRICAN AMERICAN: 21 mL/min — AB (ref >60)
Glucose, Bld: 143 mg/dL — ABNORMAL HIGH (ref 65–99)
POTASSIUM: 4.3 mmol/L (ref 3.5–5.1)
Sodium: 138 mmol/L (ref 135–145)
TOTAL PROTEIN: 6.8 g/dL (ref 6.5–8.1)

## 2017-11-30 LAB — CBC WITH DIFFERENTIAL/PLATELET
BASOS ABS: 0 10*3/uL (ref 0.0–0.1)
BASOS PCT: 0 %
EOS ABS: 0.2 10*3/uL (ref 0.0–0.7)
Eosinophils Relative: 3 %
HEMATOCRIT: 36.2 % (ref 36.0–46.0)
HEMOGLOBIN: 11.8 g/dL — AB (ref 12.0–15.0)
Lymphocytes Relative: 29 %
Lymphs Abs: 1.7 10*3/uL (ref 0.7–4.0)
MCH: 28.9 pg (ref 26.0–34.0)
MCHC: 32.6 g/dL (ref 30.0–36.0)
MCV: 88.5 fL (ref 78.0–100.0)
Monocytes Absolute: 0.6 10*3/uL (ref 0.1–1.0)
Monocytes Relative: 11 %
NEUTROS ABS: 3.4 10*3/uL (ref 1.7–7.7)
NEUTROS PCT: 57 %
Platelets: 146 10*3/uL — ABNORMAL LOW (ref 150–400)
RBC: 4.09 MIL/uL (ref 3.87–5.11)
RDW: 14.6 % (ref 11.5–15.5)
WBC: 6 10*3/uL (ref 4.0–10.5)

## 2017-11-30 LAB — URINALYSIS, ROUTINE W REFLEX MICROSCOPIC
Bilirubin Urine: NEGATIVE
GLUCOSE, UA: 250 mg/dL — AB
Hgb urine dipstick: NEGATIVE
KETONES UR: NEGATIVE mg/dL
LEUKOCYTES UA: NEGATIVE
Nitrite: NEGATIVE
PROTEIN: NEGATIVE mg/dL
Specific Gravity, Urine: 1.02 (ref 1.005–1.030)
pH: 6 (ref 5.0–8.0)

## 2017-11-30 LAB — TROPONIN I

## 2017-11-30 LAB — LIPASE, BLOOD: LIPASE: 21 U/L (ref 11–51)

## 2017-11-30 LAB — BRAIN NATRIURETIC PEPTIDE: B Natriuretic Peptide: 116.8 pg/mL — ABNORMAL HIGH (ref 0.0–100.0)

## 2017-11-30 LAB — CBG MONITORING, ED: Glucose-Capillary: 126 mg/dL — ABNORMAL HIGH (ref 65–99)

## 2017-11-30 MED ORDER — SODIUM CHLORIDE 0.9 % IV BOLUS (SEPSIS)
500.0000 mL | Freq: Once | INTRAVENOUS | Status: AC
  Administered 2017-11-30: 500 mL via INTRAVENOUS

## 2017-11-30 MED ORDER — FLUCONAZOLE 150 MG PO TABS: 150.0000 mg | ORAL_TABLET | Freq: Every day | ORAL | 0 refills | Status: AC

## 2017-11-30 MED ORDER — CEPHALEXIN 500 MG PO CAPS: 500.0000 mg | ORAL_CAPSULE | Freq: Two times a day (BID) | ORAL | 0 refills | Status: AC

## 2017-11-30 MED FILL — FLUCONAZOLE 150 MG TABLET: 150 | 2 days supply | Qty: 2 | Fill #0

## 2017-11-30 MED FILL — CEPHALEXIN 500 MG CAPSULE: 500 | 10 days supply | Qty: 20 | Fill #0

## 2017-11-30 NOTE — ED Provider Notes (Signed)
Emergency Department Provider Note   I have reviewed the triage vital signs and the nursing notes.   HISTORY  Chief Complaint Multiple c/o   HPI [name] is a 84 y.o. female presents to the ED for evaluation of leg swelling, mild redness, increased confusion, and visual hallucinations. No new medications. No fever or chills. Family state that last night she was complaining of itching and then pain in the chest.  That did not continue today but family is concerned because she has had worsening visual hallucinations.  She has had these in the past but last night was the worst they have had yet.  Family state that they had to walk around the room and have the patient point out where she was seeing people in order to comfort her.  They are also complaining about some weeping and drainage from the bilateral lower extremities which are more edematous than normal.  She is been compliant with her Lasix.   Level 5 caveat: Dementia.    Past Medical History:  Diagnosis Date  . Adenomatous colon polyp 07/2002  . Arthritis   . Coronary artery disease    s/p CABG 2001 x 5  . CVA (cerebral vascular accident) [facility]) 2013 or 2014   Dr Evelena Leyden, Neurology  . Dementia   . Diabetes mellitus    Type I  . Diverticulosis   . Erosive esophagitis   . GERD (gastroesophageal reflux disease)   . History of swelling of feet    props up at night, circulations has been checked inlegs in past and was told it was ok  . Hyperlipidemia   . Hypertension   . Hypothyroidism   . Obesity   . Paroxysmal atrial fibrillation ([place]) 02/04/15   discovered on PPM remote interrogation 6/16  . Peptic stricture of esophagus   . Sick sinus syndrome [facility])    s/p Medtronic DDD pacemaker implanted; generator change 02/2012  . Sliding hiatal hernia     Patient Active Problem List   Diagnosis Date Noted  . Neuropathy 09/06/2017  . Overactive bladder 02/02/2017  . CKD (chronic kidney disease) stage 3, GFR 30-59 ml/min (HCC)  11/03/2016  . PND (post-nasal drip) 11/03/2016  . Anxiety and depression 06/22/2016  . History of total knee arthroplasty 07/19/2015  . At risk for sleep apnea 07/17/2015  . Memory loss 04/05/2014  . Encounter for Shauntee Karp-term (current) use of other medications 07/29/2012  . Diabetes ([place]) 04/28/2012  . CVA (cerebral vascular accident) ([place])   . EDEMA- LOCALIZED 01/06/2011  . Chronic systolic heart failure ([place]) 12/26/2010  . GERD 04/30/2010  . TRANSIENT ISCHEMIC ATTACKS, HX OF 04/30/2010  . DYSPEPSIA 04/14/2010  . Generalized weakness 03/19/2010  . CONSTIPATION 02/13/2010  . ANEMIA, MILD 01/14/2010  . Hyperlipidemia 09/16/2008  . HYPERTENSION, BENIGN 09/16/2008  . CAD, AUTOLOGOUS BYPASS GRAFT 09/16/2008  . SICK SINUS/ TACHY-BRADY SYNDROME 09/16/2008  . PACEMAKER, PERMANENT 09/16/2008  . Hypothyroidism 06/14/2007  . COLONIC POLYPS, HX OF 03/23/2007    Past Surgical History:  Procedure Laterality Date  . APPENDECTOMY    . CATARACT EXTRACTION  05/2005  . CHOLECYSTECTOMY    . COLONOSCOPY W/ POLYPECTOMY  2003  . CORONARY ARTERY BYPASS GRAFT  02/2000   5 vessel  . PACEMAKER PLACEMENT  03/25/2000   by Dr Olevia Perches  . PERMANENT PACEMAKER GENERATOR CHANGE  03/18/2012   PPM gen change by Dr Rayann Heman  . REPLACEMENT TOTAL KNEE  2004   Right  . ROTATOR CUFF REPAIR Right 2009  .  TONSILLECTOMY AND ADENOIDECTOMY    . TOTAL ABDOMINAL HYSTERECTOMY W/ BILATERAL SALPINGOOPHORECTOMY     Endometiosis  . TOTAL KNEE ARTHROPLASTY Left 07/19/2015   Procedure: LEFT TOTAL KNEE ARTHROPLASTY;  Surgeon: Latanya Maudlin, MD;  Location: [facility];  Service: Orthopedics;  Laterality: Left;    Current Outpatient Rx  . Order #: [id number] Class: Normal  . Order #: [id number] Class: Historical Med  . Order #: [id number] Class: Normal  . Order #: [id number] Class: Normal  . Order #: [id number] Class: Normal  . Order #: [id number] Class: Historical Med  . Order #: [id number] Class: Print  . Order #: [id number] Class: No Print  .  Order #: [id number] Class: Normal  . Order #: [id number] Class: Normal  . Order #: [id number] Class: Normal  . Order #: [id number] Class: Print  . Order #: [id number] Class: Normal  . Order #: [id number] Class: Normal  . Order #: [id number] Class: Normal  . Order #: [id number] Class: Historical Med  . Order #: [id number] Class: Print  . Order #: [id number] Class: Normal  . Order #: [id number] Class: Normal  . Order #: [id number] Class: Normal  . Order #: [id number] Class: Normal  . Order #: [id number] Class: Historical Med  . Order #: [id number] Class: Normal  . Order #: [id number] Class: Normal  . Order #: [id number] Class: Normal  . Order #: [id number] Class: Normal  . Order #: [id number] Class: Normal  . Order #: [id number] Class: Normal    Allergies Codeine and Robaxin [methocarbamol]  Family History  Problem Relation Age of Onset  . Colon cancer Mother 60  . Breast cancer Mother   . Transient ischemic attack Mother   . Cancer Mother   . Hypertension Mother   . Stroke Mother   . Diabetes Mother   . Colon cancer Brother 63  . Cancer Brother   . Hypertension Brother   . Heart attack Brother   . Breast cancer Sister   . Cancer Sister   . Diabetes Sister   . Prostate cancer Brother   . Heart attack Brother   . Heart disease Unknown        3 brothers had MI; 1 pre 52  . Hypertension Sister   . Heart attack Daughter   . Diabetes Maternal Aunt     Social History Social History   Tobacco Use  . Smoking status: Former Research scientist (life sciences)  . Smokeless tobacco: Former Systems developer    Quit date: 11/23/1981  . Tobacco comment: quit 28+ yrs ago (as of 2012)  Substance Use Topics  . Alcohol use: No  . Drug use: No    Review of Systems  Level 5 caveat: Dementia.   ____________________________________________   PHYSICAL EXAM:  VITAL SIGNS: ED Triage Vitals  Enc Vitals Group     BP 11/30/17 1158 117/60     Pulse Rate 11/30/17 1158 60     Resp 11/30/17 1158 18     Temp 11/30/17 1158 98.2 F (36.8 C)     Temp Source  11/30/17 1158 Oral     SpO2 11/30/17 1158 95 %     Weight 11/30/17 1159 214 lb (97.1 kg)     Height 11/30/17 1159 5\' 5"  (1.651 m)   Constitutional: Alert but confused (at baseline per family). Well appearing and in no acute distress. Eyes: Conjunctivae are normal.  Head: Atraumatic. Nose: No congestion/rhinnorhea. Mouth/Throat: Mucous membranes are slightly dry.  Neck: No stridor.   Cardiovascular: Normal rate, regular rhythm. Good peripheral circulation. Grossly normal heart sounds.   Respiratory: Normal respiratory effort.  No retractions. Lungs CTAB. Gastrointestinal: Soft and nontender. No distention.  Musculoskeletal: No lower  extremity tenderness. Bilateral 3+ pitting edema with some weeping and bilateral erythema (R>L). No abscess or ulceration. No gross deformities of extremities. Neurologic:  Normal speech and language. No gross focal neurologic deficits are appreciated. Normal CN exam 2-12. No pronator drift.  Skin:  Skin is warm, dry and intact. No rash noted.  ____________________________________________   LABS (all labs ordered are listed, but only abnormal results are displayed)  Labs Reviewed  COMPREHENSIVE METABOLIC PANEL - Abnormal; Notable for the following components:      Result Value   Glucose, Bld 143 (*)    BUN 41 (*)    Creatinine, Ser 1.99 (*)    GFR calc non Af Amer 21 (*)    GFR calc Af Amer 24 (*)    All other components within normal limits  CBC WITH DIFFERENTIAL/PLATELET - Abnormal; Notable for the following components:   Hemoglobin 11.8 (*)    Platelets 146 (*)    All other components within normal limits  BRAIN NATRIURETIC PEPTIDE - Abnormal; Notable for the following components:   B Natriuretic Peptide 116.8 (*)    All other components within normal limits  URINALYSIS, ROUTINE W REFLEX MICROSCOPIC - Abnormal; Notable for the following components:   Glucose, UA 250 (*)    All other components within normal limits  CBG MONITORING, ED -  Abnormal; Notable for the following components:   Glucose-Capillary 126 (*)    All other components within normal limits  URINE CULTURE  LIPASE, BLOOD  TROPONIN I   ____________________________________________  EKG   EKG Interpretation  Date/Time:  Tuesday November 30 2017 11:52:45 EST Ventricular Rate:  74 PR Interval:    QRS Duration: 88 QT Interval:  398 QTC Calculation: 441 R Axis:   19 Text Interpretation:  Atrial-paced rhythm with prolonged AV conduction with occasional supraventricular complexes and with occasional Premature ventricular complexes Cannot rule out Anterior infarct , age undetermined Abnormal ECG No STEMI  Confirmed by Nanda Quinton [phone]) on 11/30/2017 11:58:46 AM Also confirmed by Nanda Quinton [phone]), editor Philomena Doheny [phone])  on 11/30/2017 3:47:55 PM       ____________________________________________  RADIOLOGY  Dg Chest 2 View  Result Date: 11/30/2017 CLINICAL DATA:  Bilateral lower extremity swelling. Chest pain, weakness and fatigue for 3 days. EXAM: CHEST  2 VIEW COMPARISON:  Single-view of the chest 03/28/2016 and 09/09/2015. FINDINGS: The patient is status post CABG with a pacing device in place. There is cardiomegaly and pulmonary vascular congestion. No consolidative process, pneumothorax or pleural effusion. No acute bony abnormality. IMPRESSION: Cardiomegaly without acute disease. Atherosclerosis. Electronically Signed   By: Inge Rise M.D.   On: 11/30/2017 13:54   Dg Foot 2 Views Right  Result Date: 11/30/2017 CLINICAL DATA:  Bilateral leg swelling and weeping. Foot ulceration. EXAM: RIGHT FOOT - 2 VIEW COMPARISON:  No prior. FINDINGS: Diffuse severe soft tissue swelling. Diffuse osteopenia degenerative change. No acute bony or joint abnormality identified. No evidence of fracture or dislocation. Surgical clips in ankle. IMPRESSION: Diffuse severe soft tissue swelling. Diffuse osteopenia and degenerative change. No acute bony abnormality.  Electronically Signed   By: Marcello Moores  Register   On: 11/30/2017 13:56    ____________________________________________   PROCEDURES  Procedure(s) performed:   Procedures  None ____________________________________________   INITIAL IMPRESSION / ASSESSMENT AND PLAN / ED COURSE  Pertinent labs & imaging results that were available during my care of the patient were reviewed by me and considered in my medical decision making (see chart for details).  She presents to the emergency department for evaluation of leg swelling and drainage with some visual hallucinations and questionable chest pain yesterday.  Patient has underlying dementia which limits the HPI and ROS.  No new medications. No CP currently. Legs appear to be mostly venous stasis dermatitis but the right leg is slightly more swollen. Normal UA. Patient does have history of dementia with visual hallucinations as a feature. Patient not otherwise acting like someone with acute delirium. She is pleasantly confused.   Labs and imaging resulted. Plan for coverage of slight increased right leg redness with Keflex and have the patient f/u with PCP regarding this and the visual hallucinations. Discussed my plan with family in detail at bedside who are comfortable with this plan at discharge.   At this time, I do not feel there is any life-threatening condition present. I have reviewed and discussed all results (EKG, imaging, lab, urine as appropriate), exam findings with patient. I have reviewed nursing notes and appropriate previous records.  I feel the patient is safe to be discharged home without further emergent workup. Discussed usual and customary return precautions. Patient and family (if present) verbalize understanding and are comfortable with this plan.  Patient will follow-up with their primary care provider. If they do not have a primary care provider, information for follow-up has been provided to them. All questions have been  answered.  ____________________________________________  FINAL CLINICAL IMPRESSION(S) / ED DIAGNOSES  Final diagnoses:  Lower extremity edema  Visual hallucinations  Cellulitis of right lower extremity     MEDICATIONS GIVEN DURING THIS VISIT:  Medications  sodium chloride 0.9 % bolus 500 mL (0 mLs Intravenous Stopped 11/30/17 1515)     NEW OUTPATIENT MEDICATIONS STARTED DURING THIS VISIT:  Keflex   Note:  This document was prepared using Dragon voice recognition software and may include unintentional dictation errors.  Nanda Quinton, MD Emergency Medicine    Artur Winningham, [facility], MD 11/30/17 [phone]

## 2017-11-30 NOTE — Telephone Encounter (Signed)
Agree with ED evaluation. 

## 2017-11-30 NOTE — Discharge Instructions (Signed)
You were seen in the ED today with swelling and visual hallucinations. We are starting antibiotics and will have you follow up with your PCP regarding the swelling and visual hallucinations. Keep the legs wrapped and elevated while at home and continue taking your Furosemide.

## 2017-11-30 NOTE — ED Triage Notes (Signed)
Per daughter pt with "leg swelling and weeping",itching all over, hallucinations, chest pain, weakness, fatigue x 3 days-pt unable to answer all ?s appropriately which daughter states is her baseline-pt is alert to name, age

## 2017-11-30 NOTE — Telephone Encounter (Signed)
Daughter calling stating her mother is having weeping to bilateral lower legs and seems more disoriented and c/o chest pain yesterday. Denies fever. + SOB with exertion. Pt has a h/o 5 bypass and pacemaker. Called PCP office who recommends to go to ED or UCC. Daughter refusing UCC but is willing to go to ED.    Reason for Disposition . SEVERE leg swelling (e.g., swelling extends above knee, entire leg is swollen, weeping fluid)  Answer Assessment - Initial Assessment Questions 1. ONSET: "When did the swelling start?" (e.g., minutes, hours, days)     yesterday 2. LOCATION: "What part of the leg is swollen?"  "Are both legs swollen or just one leg?"     Bilateral lower legs and feet 3. SEVERITY: "How bad is the swelling?" (e.g., localized; mild, moderate, severe)  - Localized - small area of swelling localized to one leg  - MILD pedal edema - swelling limited to foot and ankle, pitting edema < 1/4 inch (6 mm) deep, rest and elevation eliminate most or all swelling  - MODERATE edema - swelling of lower leg to knee, pitting edema > 1/4 inch (6 mm) deep, rest and elevation only partially reduce swelling  - SEVERE edema - swelling extends above knee, facial or hand swelling present      moderate 4. REDNESS: "Does the swelling look red or infected?"     Right leg looks like bumps and abrasions and skin is weeping 5. PAIN: "Is the swelling painful to touch?" If so, ask: "How painful is it?"   (Scale 1-10; mild, moderate or severe)     Pt has dementia cannot quantify but says it hurts 6. FEVER: "Do you have a fever?" If so, ask: "What is it, how was it measured, and when did it start?"     97.2 7. CAUSE: "What do you think is causing the leg swelling?"     Pt with CHF 8. MEDICAL HISTORY: "Do you have a history of heart failure, kidney disease, liver failure, or cancer?"     5 bypass, pacemaker, watching kidneys on Eliquis 9. RECURRENT SYMPTOM: "Have you had leg swelling before?" If so, ask: "When  was the last time?" "What happened that time?"     Yes for years 10. OTHER SYMPTOMS: "Do you have any other symptoms?" (e.g., chest pain, difficulty breathing)       Pain back of her shoulders and left arm, and SOB with exertion 11. PREGNANCY: "Is there any chance you are pregnant?" "When was your last menstrual period?"       n/a  Protocols used: LEG SWELLING AND EDEMA-A-AH

## 2017-12-02 ENCOUNTER — Other Ambulatory Visit: Payer: Self-pay | Admitting: Endocrinology

## 2017-12-02 LAB — CUP PACEART REMOTE DEVICE CHECK
Battery Remaining Longevity: 96 mo
Battery Voltage: 2.78 V
Brady Statistic AP VS Percent: 96 %
Brady Statistic AS VP Percent: 0 %
Date Time Interrogation Session: 20190103171633
Implantable Lead Implant Date: 20010503
Implantable Lead Location: 753859
Implantable Lead Location: 753860
Implantable Lead Model: 5076
Lead Channel Impedance Value: 830 Ohm
Lead Channel Pacing Threshold Pulse Width: 0.4 ms
Lead Channel Pacing Threshold Pulse Width: 0.4 ms
Lead Channel Setting Pacing Amplitude: 2 V
Lead Channel Setting Pacing Amplitude: 2.5 V
Lead Channel Setting Pacing Pulse Width: 0.4 ms
Lead Channel Setting Sensing Sensitivity: 5.6 mV
MDC IDC LEAD IMPLANT DT: 20010503
MDC IDC MSMT BATTERY IMPEDANCE: 376 Ohm
MDC IDC MSMT LEADCHNL RA IMPEDANCE VALUE: 421 Ohm
MDC IDC MSMT LEADCHNL RA PACING THRESHOLD AMPLITUDE: 0.625 V
MDC IDC MSMT LEADCHNL RV PACING THRESHOLD AMPLITUDE: 1.125 V
MDC IDC PG IMPLANT DT: 20130426
MDC IDC STAT BRADY AP VP PERCENT: 3 %
MDC IDC STAT BRADY AS VS PERCENT: 1 %

## 2017-12-02 LAB — URINE CULTURE
CULTURE: NO GROWTH
SPECIAL REQUESTS: NORMAL

## 2017-12-14 ENCOUNTER — Other Ambulatory Visit: Payer: Self-pay | Admitting: Internal Medicine

## 2017-12-21 ENCOUNTER — Telehealth: Payer: Self-pay | Admitting: Internal Medicine

## 2017-12-21 ENCOUNTER — Encounter: Payer: Self-pay | Admitting: *Deleted

## 2017-12-21 NOTE — Telephone Encounter (Signed)
Spoke to patient's daughter - reviewed patient's medication with her.  She is aware that Namzaric includes both memantine and donepezil.  I inquired to see if her mother was exhibiting any signs of infection in light of her recent cellulitis.  Her daughter reported that her mother's legs are appearing red again.  She also started showing signs of a yeast infection today.  I instructed her to contact her PCP to rule out an infection as a cause for her hallucinations and confusion.  If infection is not the cause then per vo by Dr. Krista Blue, she would provide her with a prescription for Seroquel 25mg , 0.5 - 1 tablet at bedtime.  The daughter is going to contact Dr. Quay Burow to have her evaluated first.

## 2017-12-21 NOTE — Telephone Encounter (Addendum)
[name] said the pt is having an increase in hallucinations and they are more frequent, daily now. Daughter said pt was dx with cellulitis and given keflex about 3 wks ago. She finished the medication with no problems and even seemed to be more of herself. The past 1-1/2 weeks she's had an increase in hallucinations, she is seeing basketball players in the house, neighbors are playing basketball outside and other things, pt cannot dress herself, put in her dentures. She is wanting to know if the pt could go back on the donepezil. Please call to advise at [phone].

## 2017-12-21 NOTE — Telephone Encounter (Signed)
Pt daughter has called RN Sharyn Lull back, she is asking for a call back

## 2017-12-21 NOTE — Telephone Encounter (Signed)
Left message requesting her daughter to return my call.

## 2017-12-21 NOTE — Telephone Encounter (Signed)
Copied from [place] [phone]. Topic: Quick Communication - See Telephone Encounter >> Dec 21, 2017  4:12 PM Vernona Rieger wrote: CRM for notification. See Telephone encounter for:   12/21/17.  Patient's daughter called and stated that they took 3 weeks ago she went to the ER and had cellulitis in her legs. She took an antibiotic and it cleared it up, they gave her Keflex and that is what helped her. It took away the rash. She states that she knows her mom needs to come in and see Dr Quay Burow but she is refusing to come to the dr. Daughter said that the redness/rash is coming back on both legs. She wants to know if Dr Quay Burow would give her another round of Keflex, she said if not she really wants someone to call her @ [phone] [name])

## 2017-12-22 MED ORDER — FLUCONAZOLE 150 MG PO TABS: 150.0000 mg | ORAL_TABLET | Freq: Once | ORAL | 0 refills | Status: AC

## 2017-12-22 MED ORDER — CEPHALEXIN 500 MG PO CAPS: 500.0000 mg | ORAL_CAPSULE | Freq: Three times a day (TID) | ORAL | 0 refills | Status: DC

## 2017-12-22 NOTE — Telephone Encounter (Signed)
Yes, I will prescribe.  Keflex sent to p[facility].  Have her daughter monitor closely and bring her in if it does not improve/resolve

## 2017-12-22 NOTE — Telephone Encounter (Signed)
Spoke with pts daughter to inform RX had been sent, per Dr Quay Burow okay to send Diflucan in with keflex.

## 2017-12-28 ENCOUNTER — Emergency Department (HOSPITAL_COMMUNITY): Payer: Medicare Other

## 2017-12-28 ENCOUNTER — Emergency Department (HOSPITAL_COMMUNITY)
Admission: EM | Admit: 2017-12-28 | Discharge: 2017-12-28 | Disposition: A | Discharge status: Home / Self Care | Payer: Medicare Other | Attending: Emergency Medicine | Admitting: Emergency Medicine

## 2017-12-28 ENCOUNTER — Encounter (HOSPITAL_COMMUNITY): Payer: Self-pay | Admitting: Emergency Medicine

## 2017-12-28 DIAGNOSIS — N183 Chronic kidney disease, stage 3 (moderate): Secondary | ICD-10-CM | POA: Diagnosis not present

## 2017-12-28 DIAGNOSIS — I129 Hypertensive chronic kidney disease with stage 1 through stage 4 chronic kidney disease, or unspecified chronic kidney disease: Secondary | ICD-10-CM | POA: Diagnosis not present

## 2017-12-28 DIAGNOSIS — M545 Low back pain: Secondary | ICD-10-CM | POA: Diagnosis present

## 2017-12-28 DIAGNOSIS — Y929 Unspecified place or not applicable: Secondary | ICD-10-CM | POA: Diagnosis not present

## 2017-12-28 DIAGNOSIS — Z87891 Personal history of nicotine dependence: Secondary | ICD-10-CM | POA: Insufficient documentation

## 2017-12-28 DIAGNOSIS — F0390 Unspecified dementia without behavioral disturbance: Secondary | ICD-10-CM | POA: Insufficient documentation

## 2017-12-28 DIAGNOSIS — Y939 Activity, unspecified: Secondary | ICD-10-CM | POA: Diagnosis not present

## 2017-12-28 DIAGNOSIS — Z7901 Long term (current) use of anticoagulants: Secondary | ICD-10-CM | POA: Insufficient documentation

## 2017-12-28 DIAGNOSIS — I2510 Atherosclerotic heart disease of native coronary artery without angina pectoris: Secondary | ICD-10-CM | POA: Insufficient documentation

## 2017-12-28 DIAGNOSIS — E1022 Type 1 diabetes mellitus with diabetic chronic kidney disease: Secondary | ICD-10-CM | POA: Insufficient documentation

## 2017-12-28 DIAGNOSIS — Z7984 Long term (current) use of oral hypoglycemic drugs: Secondary | ICD-10-CM | POA: Diagnosis not present

## 2017-12-28 DIAGNOSIS — Y999 Unspecified external cause status: Secondary | ICD-10-CM | POA: Insufficient documentation

## 2017-12-28 DIAGNOSIS — S300XXA Contusion of lower back and pelvis, initial encounter: Secondary | ICD-10-CM | POA: Insufficient documentation

## 2017-12-28 DIAGNOSIS — W19XXXA Unspecified fall, initial encounter: Secondary | ICD-10-CM | POA: Diagnosis not present

## 2017-12-28 DIAGNOSIS — R51 Headache: Secondary | ICD-10-CM | POA: Diagnosis not present

## 2017-12-28 LAB — CBC WITH DIFFERENTIAL/PLATELET
BASOS ABS: 0 10*3/uL (ref 0.0–0.1)
BASOS PCT: 0 %
Eosinophils Absolute: 0.2 10*3/uL (ref 0.0–0.7)
Eosinophils Relative: 2 %
HEMATOCRIT: 36.1 % (ref 36.0–46.0)
HEMOGLOBIN: 11.7 g/dL — AB (ref 12.0–15.0)
Lymphocytes Relative: 31 %
Lymphs Abs: 2.2 10*3/uL (ref 0.7–4.0)
MCH: 28.6 pg (ref 26.0–34.0)
MCHC: 32.4 g/dL (ref 30.0–36.0)
MCV: 88.3 fL (ref 78.0–100.0)
MONO ABS: 0.6 10*3/uL (ref 0.1–1.0)
Monocytes Relative: 9 %
NEUTROS ABS: 4 10*3/uL (ref 1.7–7.7)
NEUTROS PCT: 58 %
Platelets: 154 10*3/uL (ref 150–400)
RBC: 4.09 MIL/uL (ref 3.87–5.11)
RDW: 14.5 % (ref 11.5–15.5)
WBC: 7 10*3/uL (ref 4.0–10.5)

## 2017-12-28 LAB — URINALYSIS, ROUTINE W REFLEX MICROSCOPIC
BILIRUBIN URINE: NEGATIVE
Bacteria, UA: NONE SEEN
Hgb urine dipstick: NEGATIVE
KETONES UR: NEGATIVE mg/dL
LEUKOCYTES UA: NEGATIVE
NITRITE: NEGATIVE
PH: 5 (ref 5.0–8.0)
Protein, ur: NEGATIVE mg/dL
RBC / HPF: NONE SEEN RBC/hpf (ref 0–5)
Specific Gravity, Urine: 1.009 (ref 1.005–1.030)
Squamous Epithelial / LPF: NONE SEEN

## 2017-12-28 LAB — BASIC METABOLIC PANEL
ANION GAP: 9 (ref 5–15)
BUN: 25 mg/dL — ABNORMAL HIGH (ref 6–20)
CALCIUM: 9.4 mg/dL (ref 8.9–10.3)
CO2: 26 mmol/L (ref 22–32)
Chloride: 105 mmol/L (ref 101–111)
Creatinine, Ser: 1.56 mg/dL — ABNORMAL HIGH (ref 0.44–1.00)
GFR, EST AFRICAN AMERICAN: 33 mL/min — AB (ref >60)
GFR, EST NON AFRICAN AMERICAN: 28 mL/min — AB (ref >60)
Glucose, Bld: 118 mg/dL — ABNORMAL HIGH (ref 65–99)
POTASSIUM: 4.5 mmol/L (ref 3.5–5.1)
SODIUM: 140 mmol/L (ref 135–145)

## 2017-12-28 MED ORDER — FENTANYL CITRATE (PF) 100 MCG/2ML IJ SOLN
100.0000 ug | Freq: Once | INTRAMUSCULAR | Status: AC
  Administered 2017-12-28: 100 ug via INTRAVENOUS
  Filled 2017-12-28: qty 2

## 2017-12-28 NOTE — ED Triage Notes (Signed)
Per [facility] pt coming from home, fell from chair to floor. C/o right lower back, small abrasion noted. No obvious deformity. Uses walker at home. No head or neck injury.

## 2017-12-28 NOTE — ED Provider Notes (Addendum)
[place] DEPT Provider Note   CSN: [id number] Arrival date & time: 12/28/17  1745     History   Chief Complaint Chief Complaint  Patient presents with  . Fall  . Back Pain    HPI [name] is a 84 y.o. female. level5 caveat dementia.  History is obtained from patient's daughter who accompanies her and who lives with her.  Patient fell this afternoon.  Unwitnessed fall.  Daughter ran to the room in which she fell immediately after hearing her fall to the ground.  Daughter reports that she struck her head.  She was dazed afterwards.  She complained of low back pain since the event.  She was brought by EMS.  No treatment prior to coming here. HPI  Past Medical History:  Diagnosis Date  . Adenomatous colon polyp 07/2002  . Arthritis   . Coronary artery disease    s/p CABG 2001 x 5  . CVA (cerebral vascular accident) [facility]) 2013 or 2014   Dr Evelena Leyden, Neurology  . Dementia   . Diabetes mellitus    Type I  . Diverticulosis   . Erosive esophagitis   . GERD (gastroesophageal reflux disease)   . History of swelling of feet    props up at night, circulations has been checked inlegs in past and was told it was ok  . Hyperlipidemia   . Hypertension   . Hypothyroidism   . Obesity   . Paroxysmal atrial fibrillation ([place]) 02/04/15   discovered on PPM remote interrogation 6/16  . Peptic stricture of esophagus   . Sick sinus syndrome [facility])    s/p Medtronic DDD pacemaker implanted; generator change 02/2012  . Sliding hiatal hernia     Patient Active Problem List   Diagnosis Date Noted  . Neuropathy 09/06/2017  . Overactive bladder 02/02/2017  . CKD (chronic kidney disease) stage 3, GFR 30-59 ml/min (HCC) 11/03/2016  . PND (post-nasal drip) 11/03/2016  . Anxiety and depression 06/22/2016  . History of total knee arthroplasty 07/19/2015  . At risk for sleep apnea 07/17/2015  . Memory loss 04/05/2014  . Encounter for long-term (current) use of other  medications 07/29/2012  . Diabetes ([place]) 04/28/2012  . CVA (cerebral vascular accident) ([place])   . EDEMA- LOCALIZED 01/06/2011  . Chronic systolic heart failure ([place]) 12/26/2010  . GERD 04/30/2010  . TRANSIENT ISCHEMIC ATTACKS, HX OF 04/30/2010  . DYSPEPSIA 04/14/2010  . Generalized weakness 03/19/2010  . CONSTIPATION 02/13/2010  . ANEMIA, MILD 01/14/2010  . Hyperlipidemia 09/16/2008  . HYPERTENSION, BENIGN 09/16/2008  . CAD, AUTOLOGOUS BYPASS GRAFT 09/16/2008  . SICK SINUS/ TACHY-BRADY SYNDROME 09/16/2008  . PACEMAKER, PERMANENT 09/16/2008  . Hypothyroidism 06/14/2007  . COLONIC POLYPS, HX OF 03/23/2007    Past Surgical History:  Procedure Laterality Date  . APPENDECTOMY    . CATARACT EXTRACTION  05/2005  . CHOLECYSTECTOMY    . COLONOSCOPY W/ POLYPECTOMY  2003  . CORONARY ARTERY BYPASS GRAFT  02/2000   5 vessel  . PACEMAKER PLACEMENT  03/25/2000   by Dr Olevia Perches  . PERMANENT PACEMAKER GENERATOR CHANGE  03/18/2012   PPM gen change by Dr Rayann Heman  . REPLACEMENT TOTAL KNEE  2004   Right  . ROTATOR CUFF REPAIR Right 2009  . TONSILLECTOMY AND ADENOIDECTOMY    . TOTAL ABDOMINAL HYSTERECTOMY W/ BILATERAL SALPINGOOPHORECTOMY     Endometiosis  . TOTAL KNEE ARTHROPLASTY Left 07/19/2015   Procedure: LEFT TOTAL KNEE ARTHROPLASTY;  Surgeon: Latanya Maudlin, MD;  Location: [facility];  Service: Orthopedics;  Laterality: Left;    OB History    No data available       Home Medications    Prior to Admission medications   Medication Sig Start Date End Date Taking? Authorizing Provider  acarbose (PRECOSE) 50 MG tablet TAKE 1 TABLET BY MOUTH THREE TIMES A DAY WITH MEALS 11/24/17   Renato Shin, MD  acetaminophen (TYLENOL) 500 MG tablet Take 500 mg by mouth every 8 (eight) hours as needed for mild pain. Take 2 tab = 1,000 mg PO Q 8 hours PRN    [provider]  apixaban (ELIQUIS) 2.5 MG TABS tablet Take 1 tablet (2.5 mg total) 2 (two) times daily by mouth. 10/08/17   Allred, Jeneen Rinks, MD    atorvastatin (LIPITOR) 20 MG tablet TAKE 1 TABLET BY MOUTH EVERY DAY 09/13/17   Binnie Rail, MD  bromocriptine (PARLODEL) 2.5 MG tablet Take 1 tablet (2.5 mg total) by mouth daily. 07/08/17   Renato Shin, MD  Calcium-Vitamin D 600-200 MG-UNIT per tablet Take 1 tablet by mouth daily.    [provider]  cephALEXin (KEFLEX) 500 MG capsule Take 1 capsule (500 mg total) by mouth 3 (three) times daily. 12/22/17   Binnie Rail, MD  cetirizine (ZYRTEC) 10 MG tablet Take 1 tablet (10 mg total) by mouth daily. 03/08/17   Binnie Rail, MD  colesevelam (WELCHOL) 625 MG tablet TAKE 2 TABLETS BY MOUTH 3 TIMES A DAY 09/20/17   Burns, Claudina Lick, MD  diltiazem (CARDIZEM) 30 MG tablet Take 1 tablet (30 mg total) by mouth every 6 (six) hours as needed. 05/20/15   Chanetta Marshall K, NP  enalapril (VASOTEC) 20 MG tablet Take 1/2 tablet (10 mg) by mouth daily. 11/05/17   Binnie Rail, MD  fluconazole (DIFLUCAN) 150 MG tablet TAKE 1 TABLET (150 MG TOTAL) BY MOUTH ONCE FOR 1 DOSE. MAY REPEAT 2ND TIME IF NEEDED. 12/22/17   [provider]  furosemide (LASIX) 20 MG tablet Take 1 tablet (20 mg total) by mouth daily. 10/20/17   Binnie Rail, MD  INVOKANA 100 MG TABS tablet TAKE 1 TABLET BY MOUTH EVERY DAY 11/28/17   Renato Shin, MD  levothyroxine (SYNTHROID, LEVOTHROID) 50 MCG tablet Take 1 tablet (50 mcg total) by mouth daily. 09/10/17   Binnie Rail, MD  Loperamide HCl (ANTI-DIARRHEAL PO) Take by mouth daily.    [provider]  meclizine (ANTIVERT) 25 MG tablet Take 1 tablet (25 mg total) by mouth 3 (three) times daily as needed for dizziness. 09/09/15   Blanchie Dessert, MD  Memantine HCl-Donepezil HCl (NAMZARIC) 28-10 MG CP24 Take 1 capsule by mouth daily. 11/24/17   Marcial Pacas, MD  metoprolol (TOPROL-XL) 200 MG 24 hr tablet TAKE 1 TABLET BY MOUTH DAILY. PLEASE MAKE YEARLY APPT FOR JANUARY WITH DR. ALLRED. 2nd  ATTEMPT 12/14/17   Allred, Jeneen Rinks, MD  mirabegron ER (MYRBETRIQ) 25 MG TB24  tablet TAKE 1 TABLET (25 MG TOTAL) BY MOUTH DAILY. 10/19/17   Binnie Rail, MD  niacin 500 MG tablet Take 500 mg by mouth every evening.     [provider]  nitroGLYCERIN (NITROSTAT) 0.3 MG SL tablet Place 1 tablet (0.3 mg total) under the tongue every 5 (five) minutes as needed. For chest pain 07/16/14   Thompson Grayer, MD  nystatin (MYCOSTATIN/NYSTOP) powder Use as directed twice per day as needed 06/12/16   Biagio Borg, MD  pantoprazole (PROTONIX) 40 MG tablet TAKE 1 TABLET  BY MOUTH EVERY DAY 09/11/17   Binnie Rail, MD  repaglinide (PRANDIN) 0.5 MG tablet Take 1 tablet (0.5 mg total) by mouth 3 (three) times daily before meals. 01/26/17   Renato Shin, MD  sertraline (ZOLOFT) 50 MG tablet Take 1 tablet (50 mg total) by mouth daily. 09/15/17   Binnie Rail, MD  sitaGLIPtin (JANUVIA) 100 MG tablet Take 0.5 tablets (50 mg total) by mouth daily. Take half tablet daily 10/19/17   Renato Shin, MD    Family History Family History  Problem Relation Age of Onset  . Colon cancer Mother 7  . Breast cancer Mother   . Transient ischemic attack Mother   . Cancer Mother   . Hypertension Mother   . Stroke Mother   . Diabetes Mother   . Colon cancer Brother 4  . Cancer Brother   . Hypertension Brother   . Heart attack Brother   . Breast cancer Sister   . Cancer Sister   . Diabetes Sister   . Prostate cancer Brother   . Heart attack Brother   . Heart disease Unknown        3 brothers had MI; 1 pre 68  . Hypertension Sister   . Heart attack Daughter   . Diabetes Maternal Aunt     Social History Social History   Tobacco Use  . Smoking status: Former Research scientist (life sciences)  . Smokeless tobacco: Former Systems developer    Quit date: 11/23/1981  . Tobacco comment: quit 28+ yrs ago (as of 2012)  Substance Use Topics  . Alcohol use: No  . Drug use: No     Allergies   Codeine and Robaxin [methocarbamol]   Review of Systems Review of Systems  Unable to perform ROS: Dementia  Musculoskeletal:  Positive for back pain and gait problem.       Walks with walker  Skin: Positive for color change.       Redness of bilateral lower extremities, treated with cellulitis which is improving according to her daughter     Physical Exam Updated Vital Signs BP (!) 139/55 (BP Location: Left Arm)   Pulse 78   Temp 98 F (36.7 C) (Oral)   Resp 16   SpO2 98%   Physical Exam  Constitutional:  Conically ill-appearing  HENT:  Head: Normocephalic and atraumatic.  Eyes: Conjunctivae are normal. Pupils are equal, round, and reactive to light.  Neck: Neck supple. No tracheal deviation present. No thyromegaly present.  Cardiovascular: Normal rate and regular rhythm.  No murmur heard. Pulmonary/Chest: Effort normal and breath sounds normal.  Abdominal: Soft. Bowel sounds are normal. She exhibits no distension. There is no tenderness.  Musculoskeletal: Normal range of motion. She exhibits no edema or tenderness.  Tired spine is nontender.  She is tender at right paralumbar area with mild soft tissue swelling  Neurological: She is alert. Coordination normal.  Follow simple commands, moves all extremities.  Cranial nerves II through XII grossly intact  Skin: Skin is warm and dry. No rash noted. There is erythema.  Erythematous at bilateral lower extremities.  DP pulses 2+ bilaterally.  Radial pulses 2+ bilaterally  Psychiatric: She has a normal mood and affect.  Nursing note and vitals reviewed.    ED Treatments / Results  Labs (all labs ordered are listed, but only abnormal results are displayed) Labs Reviewed  BASIC METABOLIC PANEL  CBC WITH DIFFERENTIAL/PLATELET  URINALYSIS, ROUTINE W REFLEX MICROSCOPIC    EKG  EKG Interpretation None  Radiology No results found.  Procedures Procedures (including critical care time)  Medications Ordered in ED Medications  fentaNYL (SUBLIMAZE) injection 100 mcg (not administered)   Results for orders placed or performed during the  hospital encounter of 69/67/89  Basic metabolic panel  Result Value Ref Range   Sodium 140 135 - 145 mmol/L   Potassium 4.5 3.5 - 5.1 mmol/L   Chloride 105 101 - 111 mmol/L   CO2 26 22 - 32 mmol/L   Glucose, Bld 118 (H) 65 - 99 mg/dL   BUN 25 (H) 6 - 20 mg/dL   Creatinine, Ser 1.56 (H) 0.44 - 1.00 mg/dL   Calcium 9.4 8.9 - 10.3 mg/dL   GFR calc non Af Amer 28 (L) >60 mL/min   GFR calc Af Amer 33 (L) >60 mL/min   Anion gap 9 5 - 15  CBC with Differential/Platelet  Result Value Ref Range   WBC 7.0 4.0 - 10.5 K/uL   RBC 4.09 3.87 - 5.11 MIL/uL   Hemoglobin 11.7 (L) 12.0 - 15.0 g/dL   HCT 36.1 36.0 - 46.0 %   MCV 88.3 78.0 - 100.0 fL   MCH 28.6 26.0 - 34.0 pg   MCHC 32.4 30.0 - 36.0 g/dL   RDW 14.5 11.5 - 15.5 %   Platelets 154 150 - 400 K/uL   Neutrophils Relative % 58 %   Neutro Abs 4.0 1.7 - 7.7 K/uL   Lymphocytes Relative 31 %   Lymphs Abs 2.2 0.7 - 4.0 K/uL   Monocytes Relative 9 %   Monocytes Absolute 0.6 0.1 - 1.0 K/uL   Eosinophils Relative 2 %   Eosinophils Absolute 0.2 0.0 - 0.7 K/uL   Basophils Relative 0 %   Basophils Absolute 0.0 0.0 - 0.1 K/uL  Urinalysis, Routine w reflex microscopic  Result Value Ref Range   Color, Urine STRAW (A) YELLOW   APPearance CLEAR CLEAR   Specific Gravity, Urine 1.009 1.005 - 1.030   pH 5.0 5.0 - 8.0   Glucose, UA >=500 (A) NEGATIVE mg/dL   Hgb urine dipstick NEGATIVE NEGATIVE   Bilirubin Urine NEGATIVE NEGATIVE   Ketones, ur NEGATIVE NEGATIVE mg/dL   Protein, ur NEGATIVE NEGATIVE mg/dL   Nitrite NEGATIVE NEGATIVE   Leukocytes, UA NEGATIVE NEGATIVE   RBC / HPF NONE SEEN 0 - 5 RBC/hpf   WBC, UA 0-5 0 - 5 WBC/hpf   Bacteria, UA NONE SEEN NONE SEEN   Squamous Epithelial / LPF NONE SEEN NONE SEEN   Dg Chest 2 View  Result Date: 11/30/2017 CLINICAL DATA:  Bilateral lower extremity swelling. Chest pain, weakness and fatigue for 3 days. EXAM: CHEST  2 VIEW COMPARISON:  Single-view of the chest 03/28/2016 and 09/09/2015. FINDINGS:  The patient is status post CABG with a pacing device in place. There is cardiomegaly and pulmonary vascular congestion. No consolidative process, pneumothorax or pleural effusion. No acute bony abnormality. IMPRESSION: Cardiomegaly without acute disease. Atherosclerosis. Electronically Signed   By: Inge Rise M.D.   On: 11/30/2017 13:54   Dg Thoracic Spine 2 View  Result Date: 12/28/2017 CLINICAL DATA:  Pain after a fall EXAM: THORACIC SPINE 2 VIEWS COMPARISON:  Radiograph 11/30/2017 FINDINGS: Post sternotomy changes and cardiac pacing leads. Surgical clips in the right upper quadrant. Thoracic alignment is within normal limits. Diffuse degenerative changes with osteophytes. Vertebral body heights are maintained. IMPRESSION: No definite acute osseous abnormality. Electronically Signed   By: Donavan Foil M.D.   On: 12/28/2017 21:14   Dg  Lumbar Spine Complete  Result Date: 12/28/2017 CLINICAL DATA:  Pain after fall EXAM: LUMBAR SPINE - COMPLETE 4+ VIEW COMPARISON:  None. FINDINGS: SI joints are symmetric. Surgical clips in the right upper quadrant. Aortic atherosclerosis. Lumbar alignment within normal limits. Vertebral body heights are normal. Mild degenerative disc changes at L1-L2 and L2-L3 with moderate degenerative changes at L4-L5 and L5-S1. Posterior facet arthropathy L3 through S1. IMPRESSION: 1. Multilevel degenerative changes.  No acute osseous abnormality. Electronically Signed   By: Donavan Foil M.D.   On: 12/28/2017 21:17   Ct Head Wo Contrast  Result Date: 12/28/2017 CLINICAL DATA:  Pain after fall. EXAM: CT HEAD WITHOUT CONTRAST CT CERVICAL SPINE WITHOUT CONTRAST TECHNIQUE: Multidetector CT imaging of the head and cervical spine was performed following the standard protocol without intravenous contrast. Multiplanar CT image reconstructions of the cervical spine were also generated. COMPARISON:  Mar 28, 2016 CT of the brain FINDINGS: CT HEAD FINDINGS Brain: No subdural, epidural, or  subarachnoid hemorrhage. Ventricles and sulci are stable. White matter changes are unchanged. No acute cortical ischemia or infarct. Cerebellum, brainstem, and basal cisterns are normal. No mass effect midline shift. Vascular: Calcified atherosclerosis in the intracranial carotids. Skull: Normal. Negative for fracture or focal lesion. Sinuses/Orbits: Chronic opacification of the left mastoid air cells and middle ear. Opacification of several superior right mastoid air cells is chronic as well. Paranasal sinuses and right middle ear are well aerated. Other: None. CT CERVICAL SPINE FINDINGS Alignment: Straightening of normal lordosis.  No other malalignment. Skull base and vertebrae: No acute fracture. No primary bone lesion or focal pathologic process. Soft tissues and spinal canal: Calcified atherosclerosis identified in the carotid arteries. The right thyroid lobe is enlarged and may contain a nodule as it is lower in attenuation than the left thyroid lobe. No other soft tissue abnormalities identified. No prevertebral soft tissue swelling. The cervical canal is grossly patent. Disc levels:  Multilevel degenerative changes. Upper chest: Negative. Other: No other abnormalities. IMPRESSION: 1. No acute intracranial abnormality identified. 2. No fracture or traumatic malalignment in the cervical spine. Degenerative changes. 3. Enlargement of the right thyroid lobe, which may contain a nodule/mass. Ultrasound could further evaluate if clinically warranted. Electronically Signed   By: Dorise Bullion III M.D   On: 12/28/2017 21:14   Ct Cervical Spine Wo Contrast  Result Date: 12/28/2017 CLINICAL DATA:  Pain after fall. EXAM: CT HEAD WITHOUT CONTRAST CT CERVICAL SPINE WITHOUT CONTRAST TECHNIQUE: Multidetector CT imaging of the head and cervical spine was performed following the standard protocol without intravenous contrast. Multiplanar CT image reconstructions of the cervical spine were also generated. COMPARISON:   Mar 28, 2016 CT of the brain FINDINGS: CT HEAD FINDINGS Brain: No subdural, epidural, or subarachnoid hemorrhage. Ventricles and sulci are stable. White matter changes are unchanged. No acute cortical ischemia or infarct. Cerebellum, brainstem, and basal cisterns are normal. No mass effect midline shift. Vascular: Calcified atherosclerosis in the intracranial carotids. Skull: Normal. Negative for fracture or focal lesion. Sinuses/Orbits: Chronic opacification of the left mastoid air cells and middle ear. Opacification of several superior right mastoid air cells is chronic as well. Paranasal sinuses and right middle ear are well aerated. Other: None. CT CERVICAL SPINE FINDINGS Alignment: Straightening of normal lordosis.  No other malalignment. Skull base and vertebrae: No acute fracture. No primary bone lesion or focal pathologic process. Soft tissues and spinal canal: Calcified atherosclerosis identified in the carotid arteries. The right thyroid lobe is enlarged and may contain a nodule  as it is lower in attenuation than the left thyroid lobe. No other soft tissue abnormalities identified. No prevertebral soft tissue swelling. The cervical canal is grossly patent. Disc levels:  Multilevel degenerative changes. Upper chest: Negative. Other: No other abnormalities. IMPRESSION: 1. No acute intracranial abnormality identified. 2. No fracture or traumatic malalignment in the cervical spine. Degenerative changes. 3. Enlargement of the right thyroid lobe, which may contain a nodule/mass. Ultrasound could further evaluate if clinically warranted. Electronically Signed   By: Dorise Bullion III M.D   On: 12/28/2017 21:14   Dg Foot 2 Views Right  Result Date: 11/30/2017 CLINICAL DATA:  Bilateral leg swelling and weeping. Foot ulceration. EXAM: RIGHT FOOT - 2 VIEW COMPARISON:  No prior. FINDINGS: Diffuse severe soft tissue swelling. Diffuse osteopenia degenerative change. No acute bony or joint abnormality identified. No  evidence of fracture or dislocation. Surgical clips in ankle. IMPRESSION: Diffuse severe soft tissue swelling. Diffuse osteopenia and degenerative change. No acute bony abnormality. Electronically Signed   By: Marcello Moores  Register   On: 11/30/2017 13:56    Initial Impression / Assessment and Plan / ED Course  I have reviewed the triage vital signs and the nursing notes.  Pertinent labs & imaging results that were available during my care of the patient were reviewed by me and considered in my medical decision making (see chart for details).     10:20 PM patient is resting comfortably after treatment with intravenous fentanyl.  She is able to ambulate without difficulty with a walker. X-rays viewed by me Renal insufficiency is chronic Tylenol for pain follow-up with PMD or return if concern for any reason Final Clinical Impressions(s) / ED Diagnoses  Diagnoses #1 fall #2 Minor closed head trauma #3 contusion to back Final diagnoses:  None   #4 chronic renal insufficiency ED Discharge Orders    None       Orlie Dakin, MD 12/28/17 2228    Orlie Dakin, MD 12/28/17 2228

## 2017-12-28 NOTE — Discharge Instructions (Signed)
[name] should use her walker at all times.  She should have Tylenol 650 mg every 4 hours for pain as needed.  Please call Dr. Quay Burow to discuss whether she should continue to be on blood thinners.  Return if concern for any reason

## 2018-01-03 ENCOUNTER — Other Ambulatory Visit: Payer: Self-pay | Admitting: Emergency Medicine

## 2018-01-03 MED ORDER — PANTOPRAZOLE SODIUM 40 MG PO TBEC: 40.0000 mg | DELAYED_RELEASE_TABLET | Freq: Every day | ORAL | 1 refills | Status: DC

## 2018-01-04 ENCOUNTER — Ambulatory Visit: Payer: Medicare Other | Admitting: Podiatry

## 2018-01-05 ENCOUNTER — Emergency Department (HOSPITAL_COMMUNITY): Payer: Medicare Other

## 2018-01-05 ENCOUNTER — Telehealth: Payer: Self-pay | Admitting: Internal Medicine

## 2018-01-05 ENCOUNTER — Inpatient Hospital Stay (HOSPITAL_COMMUNITY)
Admission: EM | Admit: 2018-01-05 | Discharge: 2018-01-10 | DRG: 070 | Disposition: A | Discharge status: Hospice | Payer: Medicare Other | Attending: Internal Medicine | Admitting: Internal Medicine

## 2018-01-05 DIAGNOSIS — I5022 Chronic systolic (congestive) heart failure: Secondary | ICD-10-CM | POA: Diagnosis present

## 2018-01-05 DIAGNOSIS — Z833 Family history of diabetes mellitus: Secondary | ICD-10-CM | POA: Diagnosis not present

## 2018-01-05 DIAGNOSIS — I480 Paroxysmal atrial fibrillation: Secondary | ICD-10-CM | POA: Diagnosis present

## 2018-01-05 DIAGNOSIS — Z823 Family history of stroke: Secondary | ICD-10-CM

## 2018-01-05 DIAGNOSIS — E1022 Type 1 diabetes mellitus with diabetic chronic kidney disease: Secondary | ICD-10-CM | POA: Diagnosis present

## 2018-01-05 DIAGNOSIS — Z7189 Other specified counseling: Secondary | ICD-10-CM | POA: Diagnosis not present

## 2018-01-05 DIAGNOSIS — K219 Gastro-esophageal reflux disease without esophagitis: Secondary | ICD-10-CM | POA: Diagnosis present

## 2018-01-05 DIAGNOSIS — I130 Hypertensive heart and chronic kidney disease with heart failure and stage 1 through stage 4 chronic kidney disease, or unspecified chronic kidney disease: Secondary | ICD-10-CM | POA: Diagnosis present

## 2018-01-05 DIAGNOSIS — R402352 Coma scale, best motor response, localizes pain, at arrival to emergency department: Secondary | ICD-10-CM | POA: Diagnosis present

## 2018-01-05 DIAGNOSIS — Z8249 Family history of ischemic heart disease and other diseases of the circulatory system: Secondary | ICD-10-CM

## 2018-01-05 DIAGNOSIS — R402122 Coma scale, eyes open, to pain, at arrival to emergency department: Secondary | ICD-10-CM | POA: Diagnosis present

## 2018-01-05 DIAGNOSIS — E041 Nontoxic single thyroid nodule: Secondary | ICD-10-CM | POA: Diagnosis present

## 2018-01-05 DIAGNOSIS — R402222 Coma scale, best verbal response, incomprehensible words, at arrival to emergency department: Secondary | ICD-10-CM | POA: Diagnosis present

## 2018-01-05 DIAGNOSIS — Z96652 Presence of left artificial knee joint: Secondary | ICD-10-CM | POA: Diagnosis present

## 2018-01-05 DIAGNOSIS — E039 Hypothyroidism, unspecified: Secondary | ICD-10-CM | POA: Diagnosis present

## 2018-01-05 DIAGNOSIS — E785 Hyperlipidemia, unspecified: Secondary | ICD-10-CM | POA: Diagnosis present

## 2018-01-05 DIAGNOSIS — G3183 Dementia with Lewy bodies: Secondary | ICD-10-CM | POA: Diagnosis not present

## 2018-01-05 DIAGNOSIS — Z515 Encounter for palliative care: Secondary | ICD-10-CM | POA: Diagnosis not present

## 2018-01-05 DIAGNOSIS — Z951 Presence of aortocoronary bypass graft: Secondary | ICD-10-CM

## 2018-01-05 DIAGNOSIS — J9601 Acute respiratory failure with hypoxia: Secondary | ICD-10-CM | POA: Diagnosis not present

## 2018-01-05 DIAGNOSIS — Z7901 Long term (current) use of anticoagulants: Secondary | ICD-10-CM | POA: Diagnosis not present

## 2018-01-05 DIAGNOSIS — R410 Disorientation, unspecified: Secondary | ICD-10-CM

## 2018-01-05 DIAGNOSIS — R404 Transient alteration of awareness: Secondary | ICD-10-CM | POA: Diagnosis not present

## 2018-01-05 DIAGNOSIS — F0281 Dementia in other diseases classified elsewhere with behavioral disturbance: Secondary | ICD-10-CM | POA: Diagnosis not present

## 2018-01-05 DIAGNOSIS — Z87891 Personal history of nicotine dependence: Secondary | ICD-10-CM | POA: Diagnosis not present

## 2018-01-05 DIAGNOSIS — I482 Chronic atrial fibrillation, unspecified: Secondary | ICD-10-CM

## 2018-01-05 DIAGNOSIS — G9341 Metabolic encephalopathy: Secondary | ICD-10-CM | POA: Diagnosis present

## 2018-01-05 DIAGNOSIS — Z950 Presence of cardiac pacemaker: Secondary | ICD-10-CM

## 2018-01-05 DIAGNOSIS — Z79890 Hormone replacement therapy (postmenopausal): Secondary | ICD-10-CM

## 2018-01-05 DIAGNOSIS — Z6834 Body mass index (BMI) 34.0-34.9, adult: Secondary | ICD-10-CM

## 2018-01-05 DIAGNOSIS — R443 Hallucinations, unspecified: Secondary | ICD-10-CM | POA: Diagnosis present

## 2018-01-05 DIAGNOSIS — J9600 Acute respiratory failure, unspecified whether with hypoxia or hypercapnia: Secondary | ICD-10-CM

## 2018-01-05 DIAGNOSIS — N183 Chronic kidney disease, stage 3 unspecified: Secondary | ICD-10-CM | POA: Diagnosis present

## 2018-01-05 DIAGNOSIS — F0390 Unspecified dementia without behavioral disturbance: Secondary | ICD-10-CM

## 2018-01-05 DIAGNOSIS — I2510 Atherosclerotic heart disease of native coronary artery without angina pectoris: Secondary | ICD-10-CM | POA: Diagnosis present

## 2018-01-05 DIAGNOSIS — Z66 Do not resuscitate: Secondary | ICD-10-CM | POA: Diagnosis present

## 2018-01-05 DIAGNOSIS — Z90710 Acquired absence of both cervix and uterus: Secondary | ICD-10-CM | POA: Diagnosis not present

## 2018-01-05 DIAGNOSIS — F0391 Unspecified dementia with behavioral disturbance: Secondary | ICD-10-CM | POA: Diagnosis not present

## 2018-01-05 DIAGNOSIS — I495 Sick sinus syndrome: Secondary | ICD-10-CM | POA: Diagnosis present

## 2018-01-05 DIAGNOSIS — I10 Essential (primary) hypertension: Secondary | ICD-10-CM | POA: Diagnosis present

## 2018-01-05 DIAGNOSIS — R4182 Altered mental status, unspecified: Secondary | ICD-10-CM | POA: Diagnosis present

## 2018-01-05 DIAGNOSIS — F419 Anxiety disorder, unspecified: Secondary | ICD-10-CM | POA: Diagnosis present

## 2018-01-05 DIAGNOSIS — E119 Type 2 diabetes mellitus without complications: Secondary | ICD-10-CM

## 2018-01-05 LAB — DIFFERENTIAL
BASOS PCT: 0 %
Basophils Absolute: 0 10*3/uL (ref 0.0–0.1)
EOS ABS: 0.2 10*3/uL (ref 0.0–0.7)
Eosinophils Relative: 3 %
LYMPHS ABS: 2 10*3/uL (ref 0.7–4.0)
Lymphocytes Relative: 32 %
MONO ABS: 0.6 10*3/uL (ref 0.1–1.0)
MONOS PCT: 9 %
Neutro Abs: 3.5 10*3/uL (ref 1.7–7.7)
Neutrophils Relative %: 56 %

## 2018-01-05 LAB — URINALYSIS, ROUTINE W REFLEX MICROSCOPIC
BACTERIA UA: NONE SEEN
BILIRUBIN URINE: NEGATIVE
Glucose, UA: >=500 mg/dL — AB
Ketones, ur: NEGATIVE mg/dL
Leukocytes, UA: NEGATIVE
Nitrite: NEGATIVE
PROTEIN: NEGATIVE mg/dL
Specific Gravity, Urine: 1.022 (ref 1.005–1.030)
Squamous Epithelial / LPF: NONE SEEN
pH: 6 (ref 5.0–8.0)

## 2018-01-05 LAB — COMPREHENSIVE METABOLIC PANEL
ALT: 27 U/L (ref 14–54)
AST: 39 U/L (ref 15–41)
Albumin: 3.8 g/dL (ref 3.5–5.0)
Alkaline Phosphatase: 45 U/L (ref 38–126)
Anion gap: 13 (ref 5–15)
BILIRUBIN TOTAL: 1.3 mg/dL — AB (ref 0.3–1.2)
BUN: 28 mg/dL — ABNORMAL HIGH (ref 6–20)
CHLORIDE: 105 mmol/L (ref 101–111)
CO2: 22 mmol/L (ref 22–32)
CREATININE: 1.59 mg/dL — AB (ref 0.44–1.00)
Calcium: 9.5 mg/dL (ref 8.9–10.3)
GFR, EST AFRICAN AMERICAN: 32 mL/min — AB (ref >60)
GFR, EST NON AFRICAN AMERICAN: 27 mL/min — AB (ref >60)
Glucose, Bld: 153 mg/dL — ABNORMAL HIGH (ref 65–99)
POTASSIUM: 5 mmol/L (ref 3.5–5.1)
Sodium: 140 mmol/L (ref 135–145)
TOTAL PROTEIN: 7 g/dL (ref 6.5–8.1)

## 2018-01-05 LAB — RAPID URINE DRUG SCREEN, HOSP PERFORMED
AMPHETAMINES: NOT DETECTED
BENZODIAZEPINES: NOT DETECTED
Barbiturates: NOT DETECTED
Cocaine: NOT DETECTED
OPIATES: NOT DETECTED
Tetrahydrocannabinol: NOT DETECTED

## 2018-01-05 LAB — I-STAT TROPONIN, ED: TROPONIN I, POC: 0.01 ng/mL (ref 0.00–0.08)

## 2018-01-05 LAB — I-STAT CHEM 8, ED
BUN: 38 mg/dL — ABNORMAL HIGH (ref 6–20)
CALCIUM ION: 1.18 mmol/L (ref 1.15–1.40)
Chloride: 104 mmol/L (ref 101–111)
Creatinine, Ser: 1.7 mg/dL — ABNORMAL HIGH (ref 0.44–1.00)
GLUCOSE: 148 mg/dL — AB (ref 65–99)
HCT: 38 % (ref 36.0–46.0)
HEMOGLOBIN: 12.9 g/dL (ref 12.0–15.0)
POTASSIUM: 4.9 mmol/L (ref 3.5–5.1)
Sodium: 140 mmol/L (ref 135–145)
TCO2: 29 mmol/L (ref 22–32)

## 2018-01-05 LAB — CBC
HEMATOCRIT: 39.4 % (ref 36.0–46.0)
HEMOGLOBIN: 12.2 g/dL (ref 12.0–15.0)
MCH: 28.4 pg (ref 26.0–34.0)
MCHC: 31 g/dL (ref 30.0–36.0)
MCV: 91.6 fL (ref 78.0–100.0)
Platelets: 161 10*3/uL (ref 150–400)
RBC: 4.3 MIL/uL (ref 3.87–5.11)
RDW: 14.5 % (ref 11.5–15.5)
WBC: 6.3 10*3/uL (ref 4.0–10.5)

## 2018-01-05 LAB — HEMOGLOBIN A1C
HEMOGLOBIN A1C: 7.1 % — AB (ref 4.8–5.6)
MEAN PLASMA GLUCOSE: 157.07 mg/dL

## 2018-01-05 LAB — ETHANOL

## 2018-01-05 LAB — APTT: aPTT: 31 seconds (ref 24–36)

## 2018-01-05 LAB — PROTIME-INR
INR: 1.26
Prothrombin Time: 15.7 seconds — ABNORMAL HIGH (ref 11.4–15.2)

## 2018-01-05 MED ORDER — SODIUM CHLORIDE 0.9 % IV BOLUS (SEPSIS)
500.0000 mL | Freq: Once | INTRAVENOUS | Status: AC
  Administered 2018-01-05: 500 mL via INTRAVENOUS

## 2018-01-05 MED ORDER — IOPAMIDOL (ISOVUE-370) INJECTION 76%
INTRAVENOUS | Status: AC
  Administered 2018-01-05: 50 mL
  Filled 2018-01-05: qty 100

## 2018-01-05 MED ORDER — FENTANYL 2500MCG IN NS 250ML (10MCG/ML) PREMIX INFUSION
0.0000 ug/h | INTRAVENOUS | Status: DC
  Administered 2018-01-05: 50 ug/h via INTRAVENOUS
  Administered 2018-01-05: 0 ug/h via INTRAVENOUS
  Administered 2018-01-05: 50 ug/h via INTRAVENOUS
  Administered 2018-01-06: 175 ug/h via INTRAVENOUS
  Filled 2018-01-05 (×3): qty 250

## 2018-01-05 MED ORDER — DEXTROSE IN LACTATED RINGERS 5 % IV SOLN
INTRAVENOUS | Status: DC
  Administered 2018-01-05 – 2018-01-06 (×2): via INTRAVENOUS

## 2018-01-05 MED ORDER — SODIUM CHLORIDE 0.9 % IV SOLN: 250.0000 mL | INTRAVENOUS | Status: DC | PRN

## 2018-01-05 MED ORDER — INSULIN ASPART 100 UNIT/ML ~~LOC~~ SOLN: 0.0000 [IU] | Freq: Three times a day (TID) | SUBCUTANEOUS | Status: DC

## 2018-01-05 MED ORDER — ROCURONIUM BROMIDE 50 MG/5ML IV SOLN
INTRAVENOUS | Status: AC | PRN
  Administered 2018-01-05: 100 mg via INTRAVENOUS

## 2018-01-05 MED ORDER — PANTOPRAZOLE SODIUM 40 MG IV SOLR
40.0000 mg | Freq: Every day | INTRAVENOUS | Status: DC
  Administered 2018-01-05: 40 mg via INTRAVENOUS
  Filled 2018-01-05: qty 40

## 2018-01-05 MED ORDER — ETOMIDATE 2 MG/ML IV SOLN
INTRAVENOUS | Status: AC | PRN
  Administered 2018-01-05: 20 mg via INTRAVENOUS

## 2018-01-05 MED ORDER — SODIUM CHLORIDE 0.9 % IV SOLN
500.0000 mg | Freq: Two times a day (BID) | INTRAVENOUS | Status: DC
  Administered 2018-01-05 – 2018-01-10 (×10): 500 mg via INTRAVENOUS
  Filled 2018-01-05 (×12): qty 5

## 2018-01-05 NOTE — H&P (Signed)
PULMONARY / CRITICAL CARE MEDICINE   Name: [name] MRN: [id number] DOB: 09-Oct-1925    ADMISSION DATE:  01/05/2018    CHIEF COMPLAINT: Altered mental status  HISTORY OF PRESENT ILLNESS:       This is a 84 year old with dementia with a significant component of hallucinations since December.  She was found unresponsive today with her face flat on the table.  There was blood from the corner of her mouth but she had no witnessed tonic-clonic activity and no loss of bowel control.  He has no prior history of seizure activity.  She is a diabetic but she had eaten breakfast and her blood sugar after breakfast was 148.  According to family she does not have difficulties with intermittent hypoglycemia.  She has a significant cardiac history and has a pacemaker in place but family is not aware that she never had tachyarrhythmias or syncopal episodes related to arrhythmias.  She was brought to the department of emergency medicine where she was intubated for airway protection.  A CT scan of the head showed no acute changes and a CTA did not show any large vessel occlusions.  She is not responsible for sorting or taking her own medications and takes no medications without supervision.  There is no possibility of confusion with medicines of other people in the household PAST MEDICAL HISTORY :  She  has a past medical history of Adenomatous colon polyp (07/2002), Arthritis, Coronary artery disease, CVA (cerebral vascular accident) ([place]) (2013 or 2014), Dementia, Diabetes mellitus, Diverticulosis, Erosive esophagitis, GERD (gastroesophageal reflux disease), History of swelling of feet, Hyperlipidemia, Hypertension, Hypothyroidism, Obesity, Paroxysmal atrial fibrillation ([place]) (02/04/15), Peptic stricture of esophagus, Sick sinus syndrome ([place]), and Sliding hiatal hernia.  PAST SURGICAL HISTORY: She  has a past surgical history that includes pacemaker placement (03/25/2000); Tonsillectomy and adenoidectomy;  Appendectomy; Cholecystectomy; Total abdominal hysterectomy w/ bilateral salpingoophorectomy; Replacement total knee (2004); Colonoscopy w/ polypectomy (2003); Cataract extraction (05/2005); Coronary artery bypass graft (02/2000); Rotator cuff repair (Right, 2009); Permanent pacemaker generator change (03/18/2012); and Total knee arthroplasty (Left, 07/19/2015).  Allergies  Allergen Reactions  . Codeine Rash  . Other Other (See Comments)    Patient cannot have a MRI because she has a pacemaker  . Robaxin [Methocarbamol] Other (See Comments)    Too strong and made the patient "loopy"    No current facility-administered medications on file prior to encounter.    Current Outpatient Medications on File Prior to Encounter  Medication Sig  . acarbose (PRECOSE) 50 MG tablet TAKE 1 TABLET BY MOUTH THREE TIMES A DAY WITH MEALS (Patient taking differently: Take 50 mg by mouth 2 (two) times daily. )  . acetaminophen (TYLENOL) 500 MG tablet Take 500-1,000 mg by mouth every 8 (eight) hours as needed (for pain or headaches).   Marland Kitchen apixaban (ELIQUIS) 2.5 MG TABS tablet Take 1 tablet (2.5 mg total) 2 (two) times daily by mouth.  Marland Kitchen atorvastatin (LIPITOR) 20 MG tablet TAKE 1 TABLET BY MOUTH EVERY DAY (Patient taking differently: Take 20 mg by mouth at bedtime. )  . bromocriptine (PARLODEL) 2.5 MG tablet Take 1 tablet (2.5 mg total) by mouth daily.  . colesevelam (WELCHOL) 625 MG tablet TAKE 2 TABLETS BY MOUTH 3 TIMES A DAY (Patient taking differently: Take 2 tablets (1,250 mg) by mouth two times a day)  . diltiazem (CARDIZEM) 30 MG tablet Take 1 tablet (30 mg total) by mouth every 6 (six) hours as needed. (Patient taking differently: Take 30 mg by mouth  every 6 (six) hours as needed (for irregular heart rate). )  . enalapril (VASOTEC) 20 MG tablet Take 1/2 tablet (10 mg) by mouth daily.  . furosemide (LASIX) 20 MG tablet Take 1 tablet (20 mg total) by mouth daily.  . INVOKANA 100 MG TABS tablet TAKE 1 TABLET BY  MOUTH EVERY DAY (Patient taking differently: Take 100 mg by mouth once a day)  . levothyroxine (SYNTHROID, LEVOTHROID) 50 MCG tablet Take 1 tablet (50 mcg total) by mouth daily.  . Loperamide HCl (ANTI-DIARRHEAL PO) Take 1 capsule by mouth 2 (two) times daily.   Marland Kitchen loratadine (CLARITIN) 10 MG tablet Take 10 mg by mouth daily.  . meclizine (ANTIVERT) 25 MG tablet Take 1 tablet (25 mg total) by mouth 3 (three) times daily as needed for dizziness.  . Memantine HCl-Donepezil HCl (NAMZARIC) 28-10 MG CP24 Take 1 capsule by mouth daily.  . metoprolol (TOPROL-XL) 200 MG 24 hr tablet TAKE 1 TABLET BY MOUTH DAILY. PLEASE MAKE YEARLY APPT FOR JANUARY WITH DR. ALLRED. 2nd  ATTEMPT (Patient taking differently: Take 200 mg by mouth daily. )  . mirabegron ER (MYRBETRIQ) 25 MG TB24 tablet TAKE 1 TABLET (25 MG TOTAL) BY MOUTH DAILY.  Marland Kitchen neomycin-bacitracin-polymyxin (NEOSPORIN) ointment Apply 1 application topically every 12 (twelve) hours. TO AFFECTED AREAS OF LEGS  . niacin 500 MG tablet Take 500 mg by mouth every evening.   . nitroGLYCERIN (NITROSTAT) 0.3 MG SL tablet Place 1 tablet (0.3 mg total) under the tongue every 5 (five) minutes as needed. For chest pain (Patient taking differently: Place 0.3 mg under the tongue every 5 (five) minutes as needed for chest pain. )  . nystatin (MYCOSTATIN/NYSTOP) powder Use as directed twice per day as needed (Patient taking differently: Apply to affected areas two times a day as needed for rashes and redness)  . pantoprazole (PROTONIX) 40 MG tablet Take 1 tablet (40 mg total) by mouth daily. (Patient taking differently: Take 40 mg by mouth at bedtime. )  . repaglinide (PRANDIN) 0.5 MG tablet Take 1 tablet (0.5 mg total) by mouth 3 (three) times daily before meals.  . sertraline (ZOLOFT) 50 MG tablet Take 1 tablet (50 mg total) by mouth daily. (Patient taking differently: Take 50 mg by mouth at bedtime. )  . sitaGLIPtin (JANUVIA) 100 MG tablet Take 0.5 tablets (50 mg total) by  mouth daily. Take half tablet daily (Patient taking differently: Take 50 mg by mouth daily. )  . cephALEXin (KEFLEX) 500 MG capsule Take 1 capsule (500 mg total) by mouth 3 (three) times daily. (Patient not taking: Reported on 01/05/2018)  . cetirizine (ZYRTEC) 10 MG tablet Take 1 tablet (10 mg total) by mouth daily. (Patient not taking: Reported on 01/05/2018)    FAMILY HISTORY:  Her indicated that her mother is deceased. She indicated that her father is deceased. She indicated that the status of her daughter is unknown. She indicated that the status of her maternal aunt is unknown. She indicated that the status of her unknown relative is unknown.   SOCIAL HISTORY: She  reports that she has quit smoking. She quit smokeless tobacco use about 36 years ago. She reports that she does not drink alcohol or use drugs.  REVIEW OF SYSTEMS:      Review of systems is obtained from the daughter.  Although she does have hallucinations she is able to do crosswords and spends most of her day watching TV.  She is essentially chair bound except for visits to the doctor.  He has been having visual and auditory hallucinations since December. No known history of prior lung disease, cardiac history is significant in that she had a myocardial infarction in 2001 followed by a 5 vessel CABG.  She had a pacemaker placed following a cardiopulmonary arrest.  As noted she does not have a known history of tachyarrhythmias or syncope.  Also was noted there has been no difficulties with hypoglycemia associated with her diabetes.  SUBJECTIVE:  As above VITAL SIGNS: BP (!) 188/83   Pulse 61   Resp 18   Ht 5\' 7"  (1.702 m)   SpO2 100%   BMI 33.52 kg/m   HEMODYNAMICS:    VENTILATOR SETTINGS: Vent Mode: PRVC FiO2 (%):  [100 %] 100 % Set Rate:  [18 bmp] 18 bmp Vt Set:  [500 mL] 500 mL PEEP:  [5 cmH20] 5 cmH20 Plateau Pressure:  [16 cmH20] 16 cmH20  INTAKE / OUTPUT: No intake/output data recorded.  PHYSICAL  EXAMINATION: General: This is an elderly female who is orally intubated and unresponsive.   Neuro: She does not respond to voice or noxious stimuli for me.  Pupils are equal at 3 mm and she has vertical nystagmus. Cardiovascular: S1 and S2 are regular regular with a 3 out of 6 systolic ejection murmur.  She has gross edema to above the knees bilaterally. Lungs: There is symmetric air movement and scattered rhonchi no wheezes. Abdomen: Abdomen is obese soft and nontender without any organomegaly or masses.  LABS:  BMET Recent Labs  Lab 01/05/18 1547 01/05/18 1551  NA 140 140  K 5.0 4.9  CL 105 104  CO2 22  --   BUN 28* 38*  CREATININE 1.59* 1.70*  GLUCOSE 153* 148*    Electrolytes Recent Labs  Lab 01/05/18 1547  CALCIUM 9.5    CBC Recent Labs  Lab 01/05/18 1547 01/05/18 1551  WBC 6.3  --   HGB 12.2 12.9  HCT 39.4 38.0  PLT 161  --     Coag's Recent Labs  Lab 01/05/18 1547  APTT 31  INR 1.26    Sepsis Markers No results for input(s): LATICACIDVEN, PROCALCITON, O2SATVEN in the last 168 hours.  ABG No results for input(s): PHART, PCO2ART, PO2ART in the last 168 hours.  Liver Enzymes Recent Labs  Lab 01/05/18 1547  AST 39  ALT 27  ALKPHOS 45  BILITOT 1.3*  ALBUMIN 3.8    Cardiac Enzymes No results for input(s): TROPONINI, PROBNP in the last 168 hours.  Glucose No results for input(s): GLUCAP in the last 168 hours.  Imaging Ct Angio Head W Or Wo Contrast  Result Date: 01/05/2018 CLINICAL DATA:  Right-sided gaze and unresponsive. EXAM: CT ANGIOGRAPHY HEAD AND NECK TECHNIQUE: Multidetector CT imaging of the head and neck was performed using the standard protocol during bolus administration of intravenous contrast. Multiplanar CT image reconstructions and MIPs were obtained to evaluate the vascular anatomy. Carotid stenosis measurements (when applicable) are obtained utilizing NASCET criteria, using the distal internal carotid diameter as the  denominator. CONTRAST:  18mL ISOVUE-370 IOPAMIDOL (ISOVUE-370) INJECTION 76% COMPARISON:  Head CT 221319 FINDINGS: CTA NECK FINDINGS Aortic arch: There is moderate calcific atherosclerosis of the aortic arch. There is no aneurysm, dissection or hemodynamically significant stenosis of the visualized ascending aorta and aortic arch. Conventional 3 vessel aortic branching pattern. The visualized proximal subclavian arteries are widely patent. Right carotid system: The right common carotid origin is widely patent. There is no common carotid or internal carotid artery dissection or  aneurysm. Moderate atherosclerotic calcification at the carotid bifurcation without hemodynamically significant stenosis. Left carotid system: The left common carotid origin is widely patent. There is no common carotid or internal carotid artery dissection or aneurysm. Moderate atherosclerotic calcification at the carotid bifurcation without hemodynamically significant stenosis. Vertebral arteries: The vertebral system is right dominant. Both vertebral artery origins are normal. Both vertebral arteries are normal to their confluence with the basilar artery. Skeleton: There is no bony spinal canal stenosis. No lytic or blastic lesions. Other neck: 3 cm right thyroid nodule. Upper chest: No pneumothorax or pleural effusion. No nodules or masses. Review of the MIP images confirms the above findings CTA HEAD FINDINGS Anterior circulation: --Intracranial internal carotid arteries: Atherosclerotic calcification of both internal carotid arteries at the skull base. --Anterior cerebral arteries: Normal. --Middle cerebral arteries: Normal. --Posterior communicating arteries: Present on the right, absent on the left. Posterior circulation: --Posterior cerebral arteries: Normal. --Superior cerebellar arteries: Normal. --Basilar artery: Normal. --Anterior inferior cerebellar arteries: Normal. --Posterior inferior cerebellar arteries: Normal. Venous  sinuses: As permitted by contrast timing, patent. Anatomic variants: Fetal origin of the right PCA. Delayed phase: Not performed. Review of the MIP images confirms the above findings. IMPRESSION: 1. No emergent large vessel occlusion or high-grade stenosis.  The 2. Bilateral carotid bifurcation atherosclerosis without hemodynamically significant stenosis. 3. Aortic Atherosclerosis (ICD10-I70.0).  The 4. 3 cm right thyroid nodule. Dedicated thyroid ultrasound recommended for further characterization on a nonemergent basis. Electronically Signed   By: Ulyses Jarred M.D.   On: 01/05/2018 16:53   Ct Angio Neck W Or Wo Contrast  Result Date: 01/05/2018 CLINICAL DATA:  Right-sided gaze and unresponsive. EXAM: CT ANGIOGRAPHY HEAD AND NECK TECHNIQUE: Multidetector CT imaging of the head and neck was performed using the standard protocol during bolus administration of intravenous contrast. Multiplanar CT image reconstructions and MIPs were obtained to evaluate the vascular anatomy. Carotid stenosis measurements (when applicable) are obtained utilizing NASCET criteria, using the distal internal carotid diameter as the denominator. CONTRAST:  49mL ISOVUE-370 IOPAMIDOL (ISOVUE-370) INJECTION 76% COMPARISON:  Head CT 221319 FINDINGS: CTA NECK FINDINGS Aortic arch: There is moderate calcific atherosclerosis of the aortic arch. There is no aneurysm, dissection or hemodynamically significant stenosis of the visualized ascending aorta and aortic arch. Conventional 3 vessel aortic branching pattern. The visualized proximal subclavian arteries are widely patent. Right carotid system: The right common carotid origin is widely patent. There is no common carotid or internal carotid artery dissection or aneurysm. Moderate atherosclerotic calcification at the carotid bifurcation without hemodynamically significant stenosis. Left carotid system: The left common carotid origin is widely patent. There is no common carotid or internal  carotid artery dissection or aneurysm. Moderate atherosclerotic calcification at the carotid bifurcation without hemodynamically significant stenosis. Vertebral arteries: The vertebral system is right dominant. Both vertebral artery origins are normal. Both vertebral arteries are normal to their confluence with the basilar artery. Skeleton: There is no bony spinal canal stenosis. No lytic or blastic lesions. Other neck: 3 cm right thyroid nodule. Upper chest: No pneumothorax or pleural effusion. No nodules or masses. Review of the MIP images confirms the above findings CTA HEAD FINDINGS Anterior circulation: --Intracranial internal carotid arteries: Atherosclerotic calcification of both internal carotid arteries at the skull base. --Anterior cerebral arteries: Normal. --Middle cerebral arteries: Normal. --Posterior communicating arteries: Present on the right, absent on the left. Posterior circulation: --Posterior cerebral arteries: Normal. --Superior cerebellar arteries: Normal. --Basilar artery: Normal. --Anterior inferior cerebellar arteries: Normal. --Posterior inferior cerebellar arteries: Normal. Venous sinuses: As  permitted by contrast timing, patent. Anatomic variants: Fetal origin of the right PCA. Delayed phase: Not performed. Review of the MIP images confirms the above findings. IMPRESSION: 1. No emergent large vessel occlusion or high-grade stenosis.  The 2. Bilateral carotid bifurcation atherosclerosis without hemodynamically significant stenosis. 3. Aortic Atherosclerosis (ICD10-I70.0).  The 4. 3 cm right thyroid nodule. Dedicated thyroid ultrasound recommended for further characterization on a nonemergent basis. Electronically Signed   By: Ulyses Jarred M.D.   On: 01/05/2018 16:53   Dg Chest Portable 1 View  Result Date: 01/05/2018 CLINICAL DATA:  Endotracheal tube and  gastric tube position EXAM: PORTABLE CHEST 1 VIEW COMPARISON:  11/30/2017 FINDINGS: Endotracheal tube in good position 2.5 cm  above the carina. Gastric tube in the stomach. Dual lead pacemaker unchanged. CABG. Mild cardiac enlargement. Negative for heart failure or edema. Mild left lower lobe atelectasis. Negative for pneumonia. Negative for pleural effusion IMPRESSION: Endotracheal tube in good position. Mild left lower lobe atelectasis. Otherwise the lungs are well aerated. Electronically Signed   By: Franchot Gallo M.D.   On: 01/05/2018 16:23   Ct Head Code Stroke Wo Contrast  Result Date: 01/05/2018 CLINICAL DATA:  Code stroke.  Unresponsive.  Right-sided gaze. EXAM: CT HEAD WITHOUT CONTRAST TECHNIQUE: Contiguous axial images were obtained from the base of the skull through the vertex without intravenous contrast. COMPARISON:  CT of the abdomen scratched at CT of the head 12/28/2017 FINDINGS: Brain: Moderate generalized atrophy and diffuse white matter disease is stable. No acute infarct, hemorrhage, or mass lesion is present. The ventricles are proportionate to the degree of atrophy. The sylvian fissure is asymmetric on the left, a chronic finding. Brainstem and cerebellum are normal. Vascular: Vascular calcifications are present within the cavernous internal carotid arteries bilaterally. There is no hyperdense vessel. Skull: Calvarium is intact. No focal lytic or blastic lesions are present. Sinuses/Orbits: The paranasal sinuses are clear. Bilateral lens replacements are present. Globes and orbits are within normal limits. ASPECTS [facility] Stroke Program Early CT Score) - Ganglionic level infarction (caudate, lentiform nuclei, internal capsule, insula, M1-M3 cortex): 7/7 - Supraganglionic infarction (M4-M6 cortex): 3/3 Total score (0-10 with 10 being normal): 10/10 IMPRESSION: 1. Stable atrophy and white matter disease. No acute intracranial abnormality. 2. ASPECTS is 10/10 The above was relayed via text pager to Dr. Kerney Elbe on 01/05/2018 at 16:03 . Electronically Signed   By: San Morelle M.D.   On: 01/05/2018 16:03       DISCUSSION:      This is a 84 year old with a sudden change in level of consciousness and a history of diabetes and atrial fibrillation for which she normally is on Xarelto.  Is no evidence of glycemia precipitating the altered mental status.  CTA did not show a large vessel occlusion.  I have ordered an EEG considering the possibility of seizures and initiated a modest dose of Keppra.  Rhythm monitoring will be in place to rule out a Stokes-Adams phenomenon.  ASSESSMENT / PLAN:  PULMONARY A: She is currently intubated and mechanically ventilated for airway protection.  CARDIOVASCULAR A: She has chronic atrial fibrillation, rhythm on the EKG appears to be 100% paced at present time.  She has a history of CAD.   ENDOCRINE A: Has both diabetes and hypothyroidism.  For now we will hold her oral hypoglycemic agents and treat her with subcutaneous insulin.  A TSH has been ordered for the morning to ensure that her repletion is adequate. NEUROLOGIC A: Acute alteration in mental status.  Very much doubt of infectious provocation.  Monitoring for arrhythmias, EEG being obtained for seizures.  Continue to monitor blood sugars.  [place]y with the daughter who holds power of attorney, she is aware that the patient has a living will and is asked the patient be made a DNR.  Greater than 32 minutes was spent in the care of this patient today who is acutely ill requiring interventions to preserve life.  Lars Masson, MD Pulmonary and [place] Pager: [phone]  01/05/2018, 6:32 PM

## 2018-01-05 NOTE — Telephone Encounter (Signed)
Claiborne Billings, RN called and spoke w/ pt's dtr. Dtr is just trying to make sure she is doing things in the right order.  She is "spinning" trying to get this all organized and not miss anything.  Dr. Krista Blue following pt for dementia. Dr. Eilleen Kempf is PCP. (Dr. Krista Blue cannot start med for dementia until cellulitis is clear)  Dtr reports that pt is unsteady and dementia is getting worse.  Pt recently at hospital for another fall and they recommended she see Dr. Rayann Heman to discuss stopping Eliquis.  Kelly advised dtr to: 1. Priority should be discussing w/ Allred tomorrow about stopping Eliquis 2. Call Dr. Eilleen Kempf today and get in to address cellulitis so that you can then f/u w/ Dr. Krista Blue for dementia medication.  Dtr appreciated Kelly's call and agreeable to plan.

## 2018-01-05 NOTE — ED Notes (Signed)
Family at bedside. 

## 2018-01-05 NOTE — ED Notes (Signed)
Waiting on RT to come help transport pt to [place].

## 2018-01-05 NOTE — ED Notes (Signed)
Please note: urine culture also obtained in case one is ordered.

## 2018-01-05 NOTE — Sedation Documentation (Signed)
Pt turning dusky and blue in CT. Back in ED room for intubation. Being bagged at this time.

## 2018-01-05 NOTE — Telephone Encounter (Signed)
Called to confirm appt, pt's dtr would like a nurse call back today. Pt has Dementia and for lack of better words per dtr she has been dealing with a lot of "craziness" lately and wanted Dr. Rayann Heman and the nurse to be aware and to see if there are any "suggestions or wisdom" to handle appt tomorrow she would great ly appreciate it. [name] [phone]

## 2018-01-05 NOTE — Sedation Documentation (Signed)
Pt intubated at this time.   

## 2018-01-05 NOTE — Progress Notes (Signed)
Pt was transported to [facility] w/o complications. Uneventful trip. Pt is stable at this time.

## 2018-01-05 NOTE — Consult Note (Signed)
Referring Physician: Dr. Melina Copa    Chief Complaint: AMS  HPI: [name] is an 84 y.o. female with a history of dementia who presents to the ED via EMS from home after found unresponsive at 2:20 PM. LKN was 2:00 PM. She has a diagnosis of dementia and has recently developed auditory and visual hallucinations, as well as significant mood lability and paranoia. She has waxing and waning symptoms based upon family description of her symptoms. Today she had a normal morning, interacting with her grandchildren. She then sat down to eat breakfast and was noted to be eating her cereal with her hand, which was abnormal for her. Family members left her alone for about 20 minutes and upon returning to the kitchen, she was found with her face and shoulders/arms down on the table, drooling and unresponsive. No evidence for items knocked over and no loud noises were heard from the adjacent room by family, so they feel that a seizure or a choking spell was unlikely.   On EMS arrival, she was severely obtunded, but would briefly shriek and move limbs spasmodically with noxious stimulation. She had blood emanating from her mouth per EMS, but family states that when she found her there was no blood. No seizure was witnessed by EMS during transport. She is on Eliquis at home. On arrival to the ED, she remained obtunded.   PMHx includes CAD s/p CABG x 5 in 2001, CVA, dementia, DM type 1, HLD, HTN, paroxysmal atrial fibrillation, hypothyroidism and SSS s/p Medtroninc pacemaker.  LSN: 2:00 PM tPA Given: No: Anticoagulated with DOAC  Past Medical History:  Diagnosis Date  . Adenomatous colon polyp 07/2002  . Arthritis   . Coronary artery disease    s/p CABG 2001 x 5  . CVA (cerebral vascular accident) [facility]) 2013 or 2014   Dr Evelena Leyden, Neurology  . Dementia   . Diabetes mellitus    Type I  . Diverticulosis   . Erosive esophagitis   . GERD (gastroesophageal reflux disease)   . History of swelling of feet    props up  at night, circulations has been checked inlegs in past and was told it was ok  . Hyperlipidemia   . Hypertension   . Hypothyroidism   . Obesity   . Paroxysmal atrial fibrillation ([place]) 02/04/15   discovered on PPM remote interrogation 6/16  . Peptic stricture of esophagus   . Sick sinus syndrome [facility])    s/p Medtronic DDD pacemaker implanted; generator change 02/2012  . Sliding hiatal hernia     Past Surgical History:  Procedure Laterality Date  . APPENDECTOMY    . CATARACT EXTRACTION  05/2005  . CHOLECYSTECTOMY    . COLONOSCOPY W/ POLYPECTOMY  2003  . CORONARY ARTERY BYPASS GRAFT  02/2000   5 vessel  . PACEMAKER PLACEMENT  03/25/2000   by Dr Olevia Perches  . PERMANENT PACEMAKER GENERATOR CHANGE  03/18/2012   PPM gen change by Dr Rayann Heman  . REPLACEMENT TOTAL KNEE  2004   Right  . ROTATOR CUFF REPAIR Right 2009  . TONSILLECTOMY AND ADENOIDECTOMY    . TOTAL ABDOMINAL HYSTERECTOMY W/ BILATERAL SALPINGOOPHORECTOMY     Endometiosis  . TOTAL KNEE ARTHROPLASTY Left 07/19/2015   Procedure: LEFT TOTAL KNEE ARTHROPLASTY;  Surgeon: Latanya Maudlin, MD;  Location: [facility];  Service: Orthopedics;  Laterality: Left;    Family History  Problem Relation Age of Onset  . Colon cancer Mother 17  . Breast cancer Mother   . Transient ischemic  attack Mother   . Cancer Mother   . Hypertension Mother   . Stroke Mother   . Diabetes Mother   . Colon cancer Brother 80  . Cancer Brother   . Hypertension Brother   . Heart attack Brother   . Breast cancer Sister   . Cancer Sister   . Diabetes Sister   . Prostate cancer Brother   . Heart attack Brother   . Heart disease Unknown        3 brothers had MI; 1 pre 45  . Hypertension Sister   . Heart attack Daughter   . Diabetes Maternal Aunt    Social History:  reports that she has quit smoking. She quit smokeless tobacco use about 36 years ago. She reports that she does not drink alcohol or use drugs.  Allergies:  Allergies  Allergen Reactions  .  Codeine Rash  . Robaxin [Methocarbamol] Other (See Comments)    Too strong     Home Medications:    ROS: Unable to obtain due to obtundation.   Physical Examination: General: Morbidly obese HEENT: [place]/AT. Dried blood in mouth Ext: Prominent pitting edema bilateral lower extremities with thin skin and generalized blanching redness  Neurologic Examination: Mental Status: Obtunded with eyes closed. Yells loudly and briefly opens eyes with mild to moderate noxious stimuli, then immediately becomes unconscious. Nonverbal other than yelling. Does not follow any commands. Does not reliably localize to pain, but will withdraw. Some spontaneous foot/ankle wiggling bilaterally as though agitated.  Cranial Nerves: II:  No blink to threat. Mild pupillary asymmetry with equal reactivity.  III,IV, VI: Keeps eyes closed. Will not gaze towards or away from visual stimuli. Eyes at midline when lids are raised. No nystagmus.   V,VII: Grimace symmetric to noxious. Blinks to eyelash stimulation bilaterally.  VIII: Not responding to auditory stimuli.  IX,X: Unable to visualize palate XI: Head at midline. XII: Does not extend tongue to command Motor/Sensory: Does not move extremities to command.  With arms raised, there is intermittent resistance then loss of tone, without definite asymmetry.  Will withdraw legs 2/5 to noxious.  Wiggles ankles and toes intermittently as though agitated.  Deep Tendon Reflexes:  2+ upper extremities.  1+ patellae 0 achilles Toes equivocal.  Cerebellar/Gait: Unable to assess  CT head and neck: 1. No acute intracranial abnormality identified. 2. No fracture or traumatic malalignment in the cervical spine. Degenerative changes. 3. Enlargement of the right thyroid lobe, which may contain a nodule/mass. Ultrasound could further evaluate if clinically warranted.  CTA head and neck: 1. No emergent large vessel occlusion or high-grade stenosis.   2. Bilateral carotid  bifurcation atherosclerosis without hemodynamically significant stenosis.   Assessment: 84 y.o. female with dementia presenting with acute onset of agitated delirium 1. No asymmetry on examination. Findings localize as diffuse bihemispheric dysfunction. Suspect that her presentation may be due to a severe cognitive fluctuation in the setting of possible Lewy body dementia (see #7 below). Also possible would be unwitnessed seizure with postictal state; of note, no jerking, twitching, tremor, nystagmus, forced eye deviation, posturing or other adventitous movements suggestive of seizure are seen on exam. Also possible would be small acute bithalamic strokes not yet visible on CT head.  2. CT head shows no acute intracranial abnormality 3. CTA shows no LVO 4. Paroxysmal atrial fibrillation on Eliquis 5. Additional Stroke Risk Factors -  CAD, prior CVA, HLD and HTN  6. SSS s/p Medtronic pacemaker. May not be able to obtain an MRI  unless the pacemaker is MRI compatible. Daughter states that she will bring the model number from home for review tomorrow morning. 7. Dementia. Given her recent development of visual hallucinations as well as cognitive fluctuations, Lewy body dementia is relatively high on the DDx. On memantine-donepezil combination pill at home.  8. Elevated BUN/Cr. Mild uremia is possible and could contribute to her picture, but is certainly not the only precipitating factor.   Plan: 1. Obtain MRI if model of pacemaker is compatible. Daughter will bring model number in the AM. 2. EEG 3. Continue Eliquis 4. Continue memantine-donepezil combination pill   5. Full toxic/metabolic work up. Basic labs should include TSH, ammonia, LFTs, RPR, ESR, C-reactive protein, B12 level 6. Gentle IV hydration 7. Telemetry monitoring 8. Frequent neuro checks    _0  signed: Dr. Kerney Elbe  01/05/2018, 3:52 PM

## 2018-01-05 NOTE — ED Provider Notes (Signed)
[facility] EMERGENCY DEPARTMENT Provider Note   CSN: [id number] Arrival date & time: 01/05/18  1549     History   Chief Complaint Chief Complaint  Patient presents with  . Code Stroke    HPI [name] is a 84 y.o. female.  HPI  Level 5 caveat for critical illness.  Patient was a code stroke activation in the field and when moved back to my area was being bagged.  Neurology had evaluated the patient had a negative Noncon CT.  She was minimally responsive and not protecting her airway.  She was emergently intubated for airway protection.  The initial plan is to get her back to CT for angios head and neck.  No other information is available at present.  Past Medical History:  Diagnosis Date  . Adenomatous colon polyp 07/2002  . Arthritis   . Coronary artery disease    s/p CABG 2001 x 5  . CVA (cerebral vascular accident) [facility]) 2013 or 2014   Dr Evelena Leyden, Neurology  . Dementia   . Diabetes mellitus    Type I  . Diverticulosis   . Erosive esophagitis   . GERD (gastroesophageal reflux disease)   . History of swelling of feet    props up at night, circulations has been checked inlegs in past and was told it was ok  . Hyperlipidemia   . Hypertension   . Hypothyroidism   . Obesity   . Paroxysmal atrial fibrillation ([place]) 02/04/15   discovered on PPM remote interrogation 6/16  . Peptic stricture of esophagus   . Sick sinus syndrome [facility])    s/p Medtronic DDD pacemaker implanted; generator change 02/2012  . Sliding hiatal hernia     Patient Active Problem List   Diagnosis Date Noted  . Neuropathy 09/06/2017  . Overactive bladder 02/02/2017  . CKD (chronic kidney disease) stage 3, GFR 30-59 ml/min (HCC) 11/03/2016  . PND (post-nasal drip) 11/03/2016  . Anxiety and depression 06/22/2016  . History of total knee arthroplasty 07/19/2015  . At risk for sleep apnea 07/17/2015  . Memory loss 04/05/2014  . Encounter for long-term (current) use of other  medications 07/29/2012  . Diabetes ([place]) 04/28/2012  . CVA (cerebral vascular accident) ([place])   . EDEMA- LOCALIZED 01/06/2011  . Chronic systolic heart failure ([place]) 12/26/2010  . GERD 04/30/2010  . TRANSIENT ISCHEMIC ATTACKS, HX OF 04/30/2010  . DYSPEPSIA 04/14/2010  . Generalized weakness 03/19/2010  . CONSTIPATION 02/13/2010  . ANEMIA, MILD 01/14/2010  . Hyperlipidemia 09/16/2008  . HYPERTENSION, BENIGN 09/16/2008  . CAD, AUTOLOGOUS BYPASS GRAFT 09/16/2008  . SICK SINUS/ TACHY-BRADY SYNDROME 09/16/2008  . PACEMAKER, PERMANENT 09/16/2008  . Hypothyroidism 06/14/2007  . COLONIC POLYPS, HX OF 03/23/2007    Past Surgical History:  Procedure Laterality Date  . APPENDECTOMY    . CATARACT EXTRACTION  05/2005  . CHOLECYSTECTOMY    . COLONOSCOPY W/ POLYPECTOMY  2003  . CORONARY ARTERY BYPASS GRAFT  02/2000   5 vessel  . PACEMAKER PLACEMENT  03/25/2000   by Dr Olevia Perches  . PERMANENT PACEMAKER GENERATOR CHANGE  03/18/2012   PPM gen change by Dr Rayann Heman  . REPLACEMENT TOTAL KNEE  2004   Right  . ROTATOR CUFF REPAIR Right 2009  . TONSILLECTOMY AND ADENOIDECTOMY    . TOTAL ABDOMINAL HYSTERECTOMY W/ BILATERAL SALPINGOOPHORECTOMY     Endometiosis  . TOTAL KNEE ARTHROPLASTY Left 07/19/2015   Procedure: LEFT TOTAL KNEE ARTHROPLASTY;  Surgeon: Latanya Maudlin, MD;  Location: [facility];  Service: Orthopedics;  Laterality: Left;    OB History    No data available       Home Medications    Prior to Admission medications   Medication Sig Start Date End Date Taking? Authorizing Provider  acarbose (PRECOSE) 50 MG tablet TAKE 1 TABLET BY MOUTH THREE TIMES A DAY WITH MEALS Patient taking differently: Take 50 mg by mouth 2 (two) times daily.  11/24/17   Renato Shin, MD  acetaminophen (TYLENOL) 500 MG tablet Take 500 mg by mouth every 8 (eight) hours as needed for mild pain. Take 2 tab = 1,000 mg PO Q 8 hours PRN    [provider]  apixaban (ELIQUIS) 2.5 MG TABS tablet Take 1 tablet (2.5  mg total) 2 (two) times daily by mouth. 10/08/17   Allred, Jeneen Rinks, MD  atorvastatin (LIPITOR) 20 MG tablet TAKE 1 TABLET BY MOUTH EVERY DAY 09/13/17   Binnie Rail, MD  bromocriptine (PARLODEL) 2.5 MG tablet Take 1 tablet (2.5 mg total) by mouth daily. 07/08/17   Renato Shin, MD  Calcium-Vitamin D 600-200 MG-UNIT per tablet Take 1 tablet by mouth daily.    [provider]  cephALEXin (KEFLEX) 500 MG capsule Take 1 capsule (500 mg total) by mouth 3 (three) times daily. 12/22/17   Binnie Rail, MD  cetirizine (ZYRTEC) 10 MG tablet Take 1 tablet (10 mg total) by mouth daily. Patient not taking: Reported on 12/28/2017 03/08/17   Binnie Rail, MD  colesevelam (WELCHOL) 625 MG tablet TAKE 2 TABLETS BY MOUTH 3 TIMES A DAY Patient taking differently: TAKE 2 TABLETS BY MOUTH 2 TIMES A DAY 09/20/17   Burns, Claudina Lick, MD  diltiazem (CARDIZEM) 30 MG tablet Take 1 tablet (30 mg total) by mouth every 6 (six) hours as needed. Patient taking differently: Take 30 mg by mouth every 6 (six) hours as needed (irregular heart rate).  05/20/15   Chanetta Marshall K, NP  enalapril (VASOTEC) 20 MG tablet Take 1/2 tablet (10 mg) by mouth daily. 11/05/17   Binnie Rail, MD  furosemide (LASIX) 20 MG tablet Take 1 tablet (20 mg total) by mouth daily. 10/20/17   Binnie Rail, MD  INVOKANA 100 MG TABS tablet TAKE 1 TABLET BY MOUTH EVERY DAY 11/28/17   Renato Shin, MD  levothyroxine (SYNTHROID, LEVOTHROID) 50 MCG tablet Take 1 tablet (50 mcg total) by mouth daily. 09/10/17   Binnie Rail, MD  Loperamide HCl (ANTI-DIARRHEAL PO) Take 2 capsules by mouth daily.     [provider]  loratadine (CLARITIN) 10 MG tablet Take 10 mg by mouth daily.    [provider]  meclizine (ANTIVERT) 25 MG tablet Take 1 tablet (25 mg total) by mouth 3 (three) times daily as needed for dizziness. 09/09/15   Blanchie Dessert, MD  Memantine HCl-Donepezil HCl (NAMZARIC) 28-10 MG CP24 Take 1 capsule by mouth daily. 11/24/17    Marcial Pacas, MD  metoprolol (TOPROL-XL) 200 MG 24 hr tablet TAKE 1 TABLET BY MOUTH DAILY. PLEASE MAKE YEARLY APPT FOR JANUARY WITH DR. ALLRED. 2nd  ATTEMPT 12/14/17   Allred, Jeneen Rinks, MD  mirabegron ER (MYRBETRIQ) 25 MG TB24 tablet TAKE 1 TABLET (25 MG TOTAL) BY MOUTH DAILY. 10/19/17   Binnie Rail, MD  niacin 500 MG tablet Take 500 mg by mouth every evening.     [provider]  nitroGLYCERIN (NITROSTAT) 0.3 MG SL tablet Place 1 tablet (0.3 mg total) under the tongue every 5 (five) minutes as needed.  For chest pain 07/16/14   Allred, Jeneen Rinks, MD  nystatin (MYCOSTATIN/NYSTOP) powder Use as directed twice per day as needed Patient taking differently: Apply 1 Bottle topically 2 (two) times daily as needed (rash and redness).  06/12/16   Biagio Borg, MD  pantoprazole (PROTONIX) 40 MG tablet Take 1 tablet (40 mg total) by mouth daily. 01/03/18   Binnie Rail, MD  repaglinide (PRANDIN) 0.5 MG tablet Take 1 tablet (0.5 mg total) by mouth 3 (three) times daily before meals. 01/26/17   Renato Shin, MD  sertraline (ZOLOFT) 50 MG tablet Take 1 tablet (50 mg total) by mouth daily. 09/15/17   Binnie Rail, MD  sitaGLIPtin (JANUVIA) 100 MG tablet Take 0.5 tablets (50 mg total) by mouth daily. Take half tablet daily 10/19/17   Renato Shin, MD    Family History Family History  Problem Relation Age of Onset  . Colon cancer Mother 36  . Breast cancer Mother   . Transient ischemic attack Mother   . Cancer Mother   . Hypertension Mother   . Stroke Mother   . Diabetes Mother   . Colon cancer Brother 29  . Cancer Brother   . Hypertension Brother   . Heart attack Brother   . Breast cancer Sister   . Cancer Sister   . Diabetes Sister   . Prostate cancer Brother   . Heart attack Brother   . Heart disease Unknown        3 brothers had MI; 1 pre 96  . Hypertension Sister   . Heart attack Daughter   . Diabetes Maternal Aunt     Social History Social History   Tobacco Use  . Smoking status:  Former Research scientist (life sciences)  . Smokeless tobacco: Former Systems developer    Quit date: 11/23/1981  . Tobacco comment: quit 28+ yrs ago (as of 2012)  Substance Use Topics  . Alcohol use: No  . Drug use: No     Allergies   Codeine and Robaxin [methocarbamol]   Review of Systems Review of Systems  Unable to perform ROS: Acuity of condition     Physical Exam Updated Vital Signs BP (!) 184/98   Pulse 61   Resp 19   SpO2 100%   Physical Exam  Constitutional: She appears well-developed and well-nourished.  HENT:  Head: Normocephalic and atraumatic.  Eyes: Pupils are equal, round, and reactive to light. Right eye exhibits no discharge. Left eye exhibits no discharge.  Neck: Normal range of motion. No tracheal deviation present.  Cardiovascular: Normal rate and regular rhythm.  Pulmonary/Chest: No respiratory distress.  BVM assist with breathing, sats 100%  Abdominal: Soft. She exhibits no mass. There is no tenderness.  Musculoskeletal: Normal range of motion. She exhibits edema. She exhibits no deformity.  Neurological: She is unresponsive. GCS eye subscore is 2. GCS verbal subscore is 2. GCS motor subscore is 5.  Skin: Skin is warm and dry. Capillary refill takes less than 2 seconds.     ED Treatments / Results  Labs (all labs ordered are listed, but only abnormal results are displayed) Labs Reviewed  PROTIME-INR - Abnormal; Notable for the following components:      Result Value   Prothrombin Time 15.7 (*)    All other components within normal limits  I-STAT CHEM 8, ED - Abnormal; Notable for the following components:   BUN 38 (*)    Creatinine, Ser 1.70 (*)    Glucose, Bld 148 (*)    All other components  within normal limits  APTT  CBC  DIFFERENTIAL  ETHANOL  COMPREHENSIVE METABOLIC PANEL  RAPID URINE DRUG SCREEN, HOSP PERFORMED  URINALYSIS, ROUTINE W REFLEX MICROSCOPIC  I-STAT TROPONIN, ED    EKG  EKG Interpretation  Date/Time:  Wednesday January 05 2018 15:58:59  EST Ventricular Rate:  78 PR Interval:    QRS Duration: 164 QT Interval:  456 QTC Calculation: 520 R Axis:   -76 Text Interpretation:  Accelerated junctional rhythm IVCD, consider atypical RBBB LVH with secondary repolarization abnormality new from prior 2/19 Confirmed by Aletta Edouard [phone]) on 01/05/2018 4:44:43 PM       Radiology Ct Head Code Stroke Wo Contrast  Result Date: 01/05/2018 CLINICAL DATA:  Code stroke.  Unresponsive.  Right-sided gaze. EXAM: CT HEAD WITHOUT CONTRAST TECHNIQUE: Contiguous axial images were obtained from the base of the skull through the vertex without intravenous contrast. COMPARISON:  CT of the abdomen scratched at CT of the head 12/28/2017 FINDINGS: Brain: Moderate generalized atrophy and diffuse white matter disease is stable. No acute infarct, hemorrhage, or mass lesion is present. The ventricles are proportionate to the degree of atrophy. The sylvian fissure is asymmetric on the left, a chronic finding. Brainstem and cerebellum are normal. Vascular: Vascular calcifications are present within the cavernous internal carotid arteries bilaterally. There is no hyperdense vessel. Skull: Calvarium is intact. No focal lytic or blastic lesions are present. Sinuses/Orbits: The paranasal sinuses are clear. Bilateral lens replacements are present. Globes and orbits are within normal limits. ASPECTS [facility] Stroke Program Early CT Score) - Ganglionic level infarction (caudate, lentiform nuclei, internal capsule, insula, M1-M3 cortex): 7/7 - Supraganglionic infarction (M4-M6 cortex): 3/3 Total score (0-10 with 10 being normal): 10/10 IMPRESSION: 1. Stable atrophy and white matter disease. No acute intracranial abnormality. 2. ASPECTS is 10/10 The above was relayed via text pager to Dr. Kerney Elbe on 01/05/2018 at 16:03 . Electronically Signed   By: San Morelle M.D.   On: 01/05/2018 16:03    Procedures Procedure Name: Intubation Date/Time: 01/05/2018 4:16  PM Performed by: Hayden Rasmussen, MD Pre-anesthesia Checklist: Emergency Drugs available, Patient identified, Suction available, Patient being monitored and Timeout performed Oxygen Delivery Method: Ambu bag Preoxygenation: Pre-oxygenation with 100% oxygen Induction Type: Rapid sequence Ventilation: Mask ventilation without difficulty Laryngoscope Size: Glidescope and 3 Grade View: Grade II Tube type: Subglottic suction tube Tube size: 7.5 mm Number of attempts: 1 Airway Equipment and Method: Video-laryngoscopy Placement Confirmation: ETT inserted through vocal cords under direct vision,  Positive ETCO2,  CO2 detector and Breath sounds checked- equal and bilateral Secured at: 24 cm Tube secured with: ETT holder Dental Injury: Teeth and Oropharynx as per pre-operative assessment      .Critical Care Performed by: Hayden Rasmussen, MD Authorized by: Hayden Rasmussen, MD   Critical care provider statement:    Critical care time (minutes):  45   Critical care time was exclusive of:  Separately billable procedures and treating other patients and teaching time   Critical care was necessary to treat or prevent imminent or life-threatening deterioration of the following conditions:  CNS failure or compromise and respiratory failure   Critical care was time spent personally by me on the following activities:  Development of treatment plan with patient or surrogate, discussions with consultants, evaluation of patient's response to treatment, examination of patient, obtaining history from patient or surrogate, ordering and performing treatments and interventions, ordering and review of laboratory studies, ordering and review of radiographic studies, pulse oximetry, re-evaluation of patient's  condition and review of old charts   I assumed direction of critical care for this patient from another provider in my specialty: no     (including critical care time)  Medications Ordered in  ED Medications  iopamidol (ISOVUE-370) 76 % injection (not administered)  fentaNYL 2566mcg in NS 277mL (28mcg/ml) infusion-PREMIX (not administered)  etomidate (AMIDATE) injection (20 mg Intravenous Given 01/05/18 1600)  rocuronium (ZEMURON) injection (100 mg Intravenous Given 01/05/18 1602)     Initial Impression / Assessment and Plan / ED Course  I have reviewed the triage vital signs and the nursing notes.  Pertinent labs & imaging results that were available during my care of the patient were reviewed by me and considered in my medical decision making (see chart for details).  Clinical Course as of Jan 06 1111  Wed Jan 05, 2018  1622 Per neurology patient was last known well at 2 PM had acute onset of change in mental status with intermittent shrieking.  Not following commands.  [MB]  1703 Patient's daughter who is her healthcare proxy and power of attorney is here.  I updated her on the current status.  She states she was there at home when the patient was found at the kitchen table face down.  Daughter felt like she might be sleeping but she was unarousable.  She called EMS and patient did open her eyes but did not seem to recognize her.  She was not following commands  [MB]  1704 She is on Eliquis, has a history of multiple bypasses.  She also has a history of some strokes.  Pacemaker.  [MB]  3710 Initial workup is been unrevealing for cause of the patient's change in mental status.  Neurology feels she would not benefit from any endovascular intervention currently.  She will need a bed in the ICU.  I have paged the intensivist.  [MB]    Clinical Course User Index [MB] Hayden Rasmussen, MD      Final Clinical Impressions(s) / ED Diagnoses   Final diagnoses:  Altered mental status, unspecified altered mental status type    ED Discharge Orders    None       Hayden Rasmussen, MD 01/06/18 1116

## 2018-01-05 NOTE — Telephone Encounter (Signed)
Called [name] -   She  thinks she had a stroke.   After she got off the phone with the nurse when she called earlier she went back to see her mother and she was placed on the table.  She was not responding, but was breathing.  She is currently in the emergency room and just had a CAT scan.  There have been multiple things going on in the past few weeks including her leg swelling, possible leg infection, falls, etc.  Will follow along while in the hospital.

## 2018-01-05 NOTE — Progress Notes (Signed)
Patient was transported to CT & back to C24 without any complications.

## 2018-01-05 NOTE — Telephone Encounter (Signed)
Copied from [place] [id number]. Topic: General - Other >> Jan 05, 2018  1:59 PM Cecelia Byars, NT wrote: Reason for CRM: Patients daughter called and said has  completed cephalexin,  500 mg right  leg still has swelling  and weeping , left leg still has a few bumps , she also has dementia and still has an infections  the ed Drs say the dementia is at another level ,she fell on  2/ 5/19 and she has a bruise on her back as a result ,there were no broken bones ,  the  daughter [name] would like to speak with Dr Quay Burow prior to the patients appointment on January 19, 2018 please call her at [phone]

## 2018-01-05 NOTE — ED Triage Notes (Signed)
Pt bib ems from home for Code Stroke. Per [facility], pt LSN 1400 today; on arrival pt had rt sided gaze and no response to painful stimuli. Pt on eliquis, has hx of pacemaker. Pt CT on arrival, rushed out of CT and intubated in ER room.

## 2018-01-05 NOTE — Code Documentation (Signed)
84yo female arriving to [facility] via [place] at 1549.  Patient from home where she was LKW at 1400.  Patient was sitting at the table for lunch and was later noted to have fallen forward on the table.  Family noted patient was not responding with trembling, "eyes rolling" and almost fell from her chair.  EMS called and assessed patient to be hypertensive, unresponsive with right gaze and bloody secretions.  Stroke team at the bedside on patient arrival.  Patient cleared for CT by Dr. Tomi Bamberger.  Patient to CT with team.  Patient suctioned prior to CT scan.  CT completed.  Patient transferred to [facility].  EDP to the bedside and patient intubated.  NIHSS 20 prior to intubation, see documentation for details and code stroke times.  Patient is contraindicated for tPA d/t taking Eliquis, dose this morning.  CTA head and neck completed.  Patient is not an endovascular intervention candidate.  Patient's family at the bedside.  Family reports patient with dementia at baseline requiring assistance and recent decline over the last month.  Bedside handoff with ED RN Janett Billow.

## 2018-01-06 ENCOUNTER — Telehealth: Payer: Self-pay | Admitting: Internal Medicine

## 2018-01-06 ENCOUNTER — Encounter: Payer: Medicare Other | Admitting: Internal Medicine

## 2018-01-06 ENCOUNTER — Inpatient Hospital Stay (HOSPITAL_COMMUNITY): Payer: Medicare Other

## 2018-01-06 DIAGNOSIS — Z7189 Other specified counseling: Secondary | ICD-10-CM

## 2018-01-06 DIAGNOSIS — J9600 Acute respiratory failure, unspecified whether with hypoxia or hypercapnia: Secondary | ICD-10-CM

## 2018-01-06 DIAGNOSIS — Z515 Encounter for palliative care: Secondary | ICD-10-CM

## 2018-01-06 DIAGNOSIS — R404 Transient alteration of awareness: Secondary | ICD-10-CM

## 2018-01-06 LAB — CBC
HEMATOCRIT: 40.4 % (ref 36.0–46.0)
HEMOGLOBIN: 12.7 g/dL (ref 12.0–15.0)
MCH: 28.6 pg (ref 26.0–34.0)
MCHC: 31.4 g/dL (ref 30.0–36.0)
MCV: 91 fL (ref 78.0–100.0)
Platelets: 145 10*3/uL — ABNORMAL LOW (ref 150–400)
RBC: 4.44 MIL/uL (ref 3.87–5.11)
RDW: 14.3 % (ref 11.5–15.5)
WBC: 9.2 10*3/uL (ref 4.0–10.5)

## 2018-01-06 LAB — HEPATIC FUNCTION PANEL
ALT: 63 U/L — AB (ref 14–54)
AST: 63 U/L — AB (ref 15–41)
Albumin: 3.6 g/dL (ref 3.5–5.0)
Alkaline Phosphatase: 59 U/L (ref 38–126)
BILIRUBIN INDIRECT: 0.8 mg/dL (ref 0.3–0.9)
Bilirubin, Direct: 0.2 mg/dL (ref 0.1–0.5)
TOTAL PROTEIN: 6.5 g/dL (ref 6.5–8.1)
Total Bilirubin: 1 mg/dL (ref 0.3–1.2)

## 2018-01-06 LAB — POCT I-STAT 3, ART BLOOD GAS (G3+)
BICARBONATE: 25.9 mmol/L (ref 20.0–28.0)
O2 Saturation: 100 %
PH ART: 7.37 (ref 7.350–7.450)
PO2 ART: 228 mmHg — AB (ref 83.0–108.0)
Patient temperature: 98
TCO2: 27 mmol/L (ref 22–32)
pCO2 arterial: 44.7 mmHg (ref 32.0–48.0)

## 2018-01-06 LAB — BASIC METABOLIC PANEL
Anion gap: 12 (ref 5–15)
BUN: 27 mg/dL — AB (ref 6–20)
CHLORIDE: 106 mmol/L (ref 101–111)
CO2: 23 mmol/L (ref 22–32)
Calcium: 9 mg/dL (ref 8.9–10.3)
Creatinine, Ser: 1.44 mg/dL — ABNORMAL HIGH (ref 0.44–1.00)
GFR calc Af Amer: 36 mL/min — ABNORMAL LOW (ref >60)
GFR calc non Af Amer: 31 mL/min — ABNORMAL LOW (ref >60)
GLUCOSE: 139 mg/dL — AB (ref 65–99)
Potassium: 4.7 mmol/L (ref 3.5–5.1)
SODIUM: 141 mmol/L (ref 135–145)

## 2018-01-06 LAB — GLUCOSE, CAPILLARY: GLUCOSE-CAPILLARY: 118 mg/dL — AB (ref 65–99)

## 2018-01-06 LAB — TSH: TSH: 4.827 u[IU]/mL — AB (ref 0.350–4.500)

## 2018-01-06 LAB — AMMONIA: AMMONIA: 42 umol/L — AB (ref 9–35)

## 2018-01-06 LAB — C-REACTIVE PROTEIN

## 2018-01-06 LAB — MRSA PCR SCREENING: MRSA BY PCR: NEGATIVE

## 2018-01-06 LAB — VITAMIN B12: Vitamin B-12: 286 pg/mL (ref 180–914)

## 2018-01-06 MED ORDER — IBUPROFEN 100 MG/5ML PO SUSP
200.0000 mg | Freq: Four times a day (QID) | ORAL | Status: DC | PRN
  Filled 2018-01-06 (×2): qty 10

## 2018-01-06 MED ORDER — MIDAZOLAM HCL 2 MG/2ML IJ SOLN: 2.0000 mg | Freq: Once | INTRAMUSCULAR | Status: DC

## 2018-01-06 MED ORDER — LEVOTHYROXINE SODIUM 100 MCG IV SOLR
25.0000 ug | Freq: Every day | INTRAVENOUS | Status: DC
  Administered 2018-01-06: 25 ug via INTRAVENOUS
  Filled 2018-01-06: qty 5

## 2018-01-06 MED ORDER — MORPHINE SULFATE (PF) 4 MG/ML IV SOLN
INTRAVENOUS | Status: AC
  Filled 2018-01-06: qty 1

## 2018-01-06 MED ORDER — MORPHINE SULFATE (PF) 4 MG/ML IV SOLN
1.0000 mg | INTRAVENOUS | Status: DC | PRN
  Administered 2018-01-06 – 2018-01-07 (×2): 1 mg via INTRAVENOUS
  Filled 2018-01-06: qty 1

## 2018-01-06 MED ORDER — ORAL CARE MOUTH RINSE
15.0000 mL | OROMUCOSAL | Status: DC
  Administered 2018-01-06 (×3): 15 mL via OROMUCOSAL

## 2018-01-06 MED ORDER — CHLORHEXIDINE GLUCONATE 0.12% ORAL RINSE (MEDLINE KIT)
15.0000 mL | Freq: Two times a day (BID) | OROMUCOSAL | Status: DC
  Administered 2018-01-06 – 2018-01-10 (×9): 15 mL via OROMUCOSAL

## 2018-01-06 MED ORDER — MIDAZOLAM HCL 2 MG/2ML IJ SOLN
INTRAMUSCULAR | Status: AC
  Filled 2018-01-06: qty 2

## 2018-01-06 NOTE — Progress Notes (Signed)
Patient unable to [facility] after being extubated today. Does have some purposeful movements in all extremities, but also has spastic movements of the arms. Patient has been minimally verbal and not tracking with eyes.  EEG from day time pending.  Family concerned that she intermittently appears to be uncomfortable.  Liquid ibprofen given during day to help with pain but she spit it back up.  Family requested I notify CCM about possibly giving her something to help with pain/discomfort.  Dr Elsworth Soho ordered morphine PRN.

## 2018-01-06 NOTE — Progress Notes (Signed)
200 cc IV Fentanyl wasted in sink, witnessed by Arcola Jansky, RN.

## 2018-01-06 NOTE — Procedures (Signed)
Extubation Procedure Note  Patient Details:   Name: KEYANNA SANDEFER DOB: 05-17-1925 MRN: [id number]   Airway Documentation:     Evaluation  O2 sats: stable throughout Complications: No apparent complications Patient did tolerate procedure well. Bilateral Breath Sounds: Clear, Diminished   Yes   Pt extubated to 3L [place] per MD order. Pt stable throughout with no complications. RT will continue to monitor.   Jesse Sans 01/06/2018, 11:03 AM

## 2018-01-06 NOTE — Progress Notes (Signed)
Subjective: Patient still intubated.  Patient does attempt to follow commands.  Plan of care discussed with patient who verbalized understanding with nodding her head to answer, and shrugs her shoulders if she does not know the answer  Exam: Vitals:   01/06/18 0810 01/06/18 0900  BP: (!) 111/53 (!) 108/57  Pulse: 60 62  Resp: 18 13  Temp:    SpO2: 100% 99%    Physical Exam  HEENT-  Normocephalic, no lesions, without obvious abnormality.  Normal external eye and conjunctiva.   Cardiovascular-V paced on monitor, with audible S1-S2, pacemaker in left chest Lungs-ventilator breath sounds, patient currently intubated. saturations within normal limits Musculoskeletal-no joint tenderness, deformity or swelling Skin-warm and dry  Neuro:  Mental Status: Patient seen 10 minutes off  Fentanyl sedation; intubated. She does open her eyes and attempts to follow commands as best she can. Able to follow 3 step commands without difficulty.  Nods head to answer yes and no.   Cranial Nerves: II: Visual fields grossly normal,  III,IV, VI: ptosis not present,  PERRL V,VII: Attempts to smile - smile symmetric, patient reacts to stimuli bilaterally VIII: hearing intact to voice IX,X: Unable to accurately assess XI: bilateral shoulder shrug XII: Attempt to stick out tongue, but no extension secondary to endotracheal tube  Motor: Patient able to raise extremities of the bed to command, minimally raises left arm off bed, but able to squeeze fingers and show thumb.  Wiggles her toes to command Right : Upper extremity   4/5    Left:     Upper extremity   5/5  Lower extremity   4/5     Lower extremity   5/5 Tone and bulk:normal tone throughout; no atrophy noted Sensory: Pinprick and light touch intact throughout, bilaterally Deep Tendon Reflexes: Hypoactive Plantars: Right: downgoing   Left: downgoing Cerebellar: Unable to accurately assess  Gait: Unable to assess due to patient being intubated     Medications:  . chlorhexidine gluconate (MEDLINE KIT)  15 mL Mouth Rinse BID  . insulin aspart  0-15 Units Subcutaneous TID WC  . levothyroxine  25 mcg Intravenous Daily  . mouth rinse  15 mL Mouth Rinse 10 times per day  . pantoprazole (PROTONIX) IV  40 mg Intravenous QHS    Pertinent Labs/Diagnostics:  Ct Angio Head/Neck W Or Wo Contrast  01/05/2018 IMPRESSION:  1. No emergent large vessel occlusion or high-grade stenosis.    2. Bilateral carotid bifurcation atherosclerosis without hemodynamically significant stenosis.  3. Aortic Atherosclerosis (ICD10-I70.0).   4. 3 cm right thyroid nodule. Dedicated thyroid ultrasound recommended for further characterization on a nonemergent basis.   Ct Head Code Stroke Wo Contrast 01/05/2018 IMPRESSION:  1. Stable atrophy and white matter disease. No acute intracranial abnormality.  2. ASPECTS is 10/10    Assessment: 84 y.o. female with dementia presenting with acute onset of agitated delirium 1.  Toxic/metabolic encephalopathy patient with acute onset of agitated delirium.  On exam did not note any asymmetry.  Differential diagnosis includes: severe cognitive fluctuation in the setting of possible Lewy body dementia , possible unwitnessed seizure with postictal state - EEG pending. ;  Per nurse reports overnight there was no jerking, twitching, tremor or posturing. Also on DDx is small acute bithalamic strokes not yet visible on CT head.  Patient with pacemaker.  CTA and CT of the head did not show any LVO.  Ammonia mildly elevated 2. Dementia. Given her recent development of visual hallucinations as well as cognitive fluctuations, Lewy  body dementia is relatively high on the DDx. On memantine-donepezil combination pill at home.  3. Paroxysmal atrial fibrillation on Eliquis.  4. SSS s/p [employer] pacemaker. May not be able to obtain an MRI unless the pacemaker is MRI compatible. Daughter states that she will bring the model number from home for  review  5. CKD 6. Thyroid Nodule -incidental finding on CTA head and neck, patient was elevated TSH please consider workup 7. Stroke Risk Factors -  CAD, prior CVA, HLD and HTN   Plan: 1. Obtain MRI if model of pacemaker is compatible.  2. EEG pending 3. Continue Eliquis 4. Continue memantine-donepezil combination pill   5. Full toxic/metabolic work up pending:  LFTs, RPR, ESR, C-reactive protein, B12 level ordered this am 6. Gentle IV hydration 7. Telemetry monitoring 8. Frequent neuro checks 9. Possible extubation today - Pt is DNR and doesn't want intubation   Jacob Moores DNP Neuro-hospitalist Team [phone] 01/06/2018, 10:38 AM  Electronically signed: Dr. Kerney Elbe

## 2018-01-06 NOTE — Progress Notes (Signed)
SLP Cancellation Note  Patient Details Name: CALLYN SEVERTSON MRN: [id number] DOB: April 24, 1925   Cancelled treatment:       Reason Eval/Treat Not Completed: Patient not medically ready   Rushie Brazel, Katherene Ponto 01/06/2018, 9:09 AM

## 2018-01-06 NOTE — Telephone Encounter (Signed)
[name] (patient daughter) calling to cancel appointment and wanted to make Dr. Rayann Heman aware that patient possibly had a stroke and is currently in hospital.

## 2018-01-06 NOTE — Progress Notes (Signed)
Bedside EEG completed, results pending. 

## 2018-01-06 NOTE — Telephone Encounter (Signed)
Informed patient's daughter Dr. Rayann Heman with be notified of patient's ED visit and sent well wishes to her and the patient. She was grateful for call.   OV today cancelled.

## 2018-01-06 NOTE — Progress Notes (Addendum)
PULMONARY / CRITICAL CARE MEDICINE   Name: [name] MRN: [id number] DOB: 29-Mar-1925    ADMISSION DATE:  01/05/2018   CHIEF COMPLAINT: Altered mental status  HISTORY OF PRESENT ILLNESS:       This is a 84 year old with dementia with a significant component of hallucinations since December.  She was found unresponsive today with her face flat on the table.  There was blood from the corner of her mouth but she had no witnessed tonic-clonic activity and no loss of bowel control.  He has no prior history of seizure activity.  She is a diabetic but she had eaten breakfast and her blood sugar after breakfast was 148.  According to family she does not have difficulties with intermittent hypoglycemia.  She has a significant cardiac history and has a pacemaker in place but family is not aware that she never had tachyarrhythmias or syncopal episodes related to arrhythmias.  She was brought to the department of emergency medicine where she was intubated for airway protection.  A CT scan of the head showed no acute changes and a CTA did not show any large vessel occlusions.  She is not responsible for sorting or taking her own medications and takes no medications without supervision.  There is no possibility of confusion with medicines of other people in the household.  PAST MEDICAL HISTORY :  She  has a past medical history of Adenomatous colon polyp (07/2002), Arthritis, Coronary artery disease, CVA (cerebral vascular accident) ([place]) (2013 or 2014), Dementia, Diabetes mellitus, Diverticulosis, Erosive esophagitis, GERD (gastroesophageal reflux disease), History of swelling of feet, Hyperlipidemia, Hypertension, Hypothyroidism, Obesity, Paroxysmal atrial fibrillation ([place]) (02/04/15), Peptic stricture of esophagus, Sick sinus syndrome ([place]), and Sliding hiatal hernia.  PAST SURGICAL HISTORY: She  has a past surgical history that includes pacemaker placement (03/25/2000); Tonsillectomy and adenoidectomy;  Appendectomy; Cholecystectomy; Total abdominal hysterectomy w/ bilateral salpingoophorectomy; Replacement total knee (2004); Colonoscopy w/ polypectomy (2003); Cataract extraction (05/2005); Coronary artery bypass graft (02/2000); Rotator cuff repair (Right, 2009); Permanent pacemaker generator change (03/18/2012); and Total knee arthroplasty (Left, 07/19/2015).  Allergies  Allergen Reactions  . Codeine Rash  . Other Other (See Comments)    Patient cannot have a MRI because she has a pacemaker  . Robaxin [Methocarbamol] Other (See Comments)    Too strong and made the patient "loopy"    No current facility-administered medications on file prior to encounter.    Current Outpatient Medications on File Prior to Encounter  Medication Sig  . acarbose (PRECOSE) 50 MG tablet TAKE 1 TABLET BY MOUTH THREE TIMES A DAY WITH MEALS (Patient taking differently: Take 50 mg by mouth 2 (two) times daily. )  . acetaminophen (TYLENOL) 500 MG tablet Take 500-1,000 mg by mouth every 8 (eight) hours as needed (for pain or headaches).   Marland Kitchen apixaban (ELIQUIS) 2.5 MG TABS tablet Take 1 tablet (2.5 mg total) 2 (two) times daily by mouth.  Marland Kitchen atorvastatin (LIPITOR) 20 MG tablet TAKE 1 TABLET BY MOUTH EVERY DAY (Patient taking differently: Take 20 mg by mouth at bedtime. )  . bromocriptine (PARLODEL) 2.5 MG tablet Take 1 tablet (2.5 mg total) by mouth daily.  . colesevelam (WELCHOL) 625 MG tablet TAKE 2 TABLETS BY MOUTH 3 TIMES A DAY (Patient taking differently: Take 2 tablets (1,250 mg) by mouth two times a day)  . diltiazem (CARDIZEM) 30 MG tablet Take 1 tablet (30 mg total) by mouth every 6 (six) hours as needed. (Patient taking differently: Take 30 mg by mouth  every 6 (six) hours as needed (for irregular heart rate). )  . enalapril (VASOTEC) 20 MG tablet Take 1/2 tablet (10 mg) by mouth daily.  . furosemide (LASIX) 20 MG tablet Take 1 tablet (20 mg total) by mouth daily.  . INVOKANA 100 MG TABS tablet TAKE 1 TABLET BY  MOUTH EVERY DAY (Patient taking differently: Take 100 mg by mouth once a day)  . levothyroxine (SYNTHROID, LEVOTHROID) 50 MCG tablet Take 1 tablet (50 mcg total) by mouth daily.  . Loperamide HCl (ANTI-DIARRHEAL PO) Take 1 capsule by mouth 2 (two) times daily.   Marland Kitchen loratadine (CLARITIN) 10 MG tablet Take 10 mg by mouth daily.  . meclizine (ANTIVERT) 25 MG tablet Take 1 tablet (25 mg total) by mouth 3 (three) times daily as needed for dizziness.  . Memantine HCl-Donepezil HCl (NAMZARIC) 28-10 MG CP24 Take 1 capsule by mouth daily.  . metoprolol (TOPROL-XL) 200 MG 24 hr tablet TAKE 1 TABLET BY MOUTH DAILY. PLEASE MAKE YEARLY APPT FOR JANUARY WITH DR. ALLRED. 2nd  ATTEMPT (Patient taking differently: Take 200 mg by mouth daily. )  . mirabegron ER (MYRBETRIQ) 25 MG TB24 tablet TAKE 1 TABLET (25 MG TOTAL) BY MOUTH DAILY.  Marland Kitchen neomycin-bacitracin-polymyxin (NEOSPORIN) ointment Apply 1 application topically every 12 (twelve) hours. TO AFFECTED AREAS OF LEGS  . niacin 500 MG tablet Take 500 mg by mouth every evening.   . nitroGLYCERIN (NITROSTAT) 0.3 MG SL tablet Place 1 tablet (0.3 mg total) under the tongue every 5 (five) minutes as needed. For chest pain (Patient taking differently: Place 0.3 mg under the tongue every 5 (five) minutes as needed for chest pain. )  . nystatin (MYCOSTATIN/NYSTOP) powder Use as directed twice per day as needed (Patient taking differently: Apply to affected areas two times a day as needed for rashes and redness)  . pantoprazole (PROTONIX) 40 MG tablet Take 1 tablet (40 mg total) by mouth daily. (Patient taking differently: Take 40 mg by mouth at bedtime. )  . repaglinide (PRANDIN) 0.5 MG tablet Take 1 tablet (0.5 mg total) by mouth 3 (three) times daily before meals.  . sertraline (ZOLOFT) 50 MG tablet Take 1 tablet (50 mg total) by mouth daily. (Patient taking differently: Take 50 mg by mouth at bedtime. )  . sitaGLIPtin (JANUVIA) 100 MG tablet Take 0.5 tablets (50 mg total) by  mouth daily. Take half tablet daily (Patient taking differently: Take 50 mg by mouth daily. )  . cephALEXin (KEFLEX) 500 MG capsule Take 1 capsule (500 mg total) by mouth 3 (three) times daily. (Patient not taking: Reported on 01/05/2018)  . cetirizine (ZYRTEC) 10 MG tablet Take 1 tablet (10 mg total) by mouth daily. (Patient not taking: Reported on 01/05/2018)    FAMILY HISTORY:  Her indicated that her mother is deceased. She indicated that her father is deceased. She indicated that the status of her daughter is unknown. She indicated that the status of her maternal aunt is unknown. She indicated that the status of her unknown relative is unknown.   SOCIAL HISTORY: She  reports that she has quit smoking. She quit smokeless tobacco use about 36 years ago. She reports that she does not drink alcohol or use drugs.  REVIEW OF SYSTEMS:   Unable to obtain as patient is intubated.  SUBJECTIVE:  More awake.  No acute events overnight  VITAL SIGNS: BP (!) 111/53   Pulse 61   Temp 97.8 F (36.6 C) (Oral)   Resp 18   Ht 5'  7" (1.702 m)   Wt 216 lb 7.9 oz (98.2 kg)   SpO2 100%   BMI 33.91 kg/m   HEMODYNAMICS:    VENTILATOR SETTINGS: Vent Mode: PRVC FiO2 (%):  [40 %-100 %] 40 % Set Rate:  [18 bmp] 18 bmp Vt Set:  [500 mL] 500 mL PEEP:  [5 cmH20] 5 cmH20 Plateau Pressure:  [16 cmH20-20 cmH20] 20 cmH20  INTAKE / OUTPUT: I/O last 3 completed shifts: In: 1448.9 [I.V.:738.9; IV Piggyback:710] Out: 1100 [Urine:450; Emesis/NG output:200; Other:450]  PHYSICAL EXAMINATION: Gen:      No acute distress HEENT:  EOMI, sclera anicteric, ET tube in place Neck:     No masses; no thyromegaly Lungs:    Clear to auscultation bilaterally; normal respiratory effort CV:         Regular rate and rhythm; no murmurs Abd:      + bowel sounds; soft, non-tender; no palpable masses, no distension Ext:    No edema; adequate peripheral perfusion Skin:      Warm and dry; no rash Neuro: Shaking movement of  her leg and shoulder which do not appear to be seizures Opens eyes and follows commands.  LABS:  BMET Recent Labs  Lab 01/05/18 1547 01/05/18 1551 01/06/18 0315  NA 140 140 141  K 5.0 4.9 4.7  CL 105 104 106  CO2 22  --  23  BUN 28* 38* 27*  CREATININE 1.59* 1.70* 1.44*  GLUCOSE 153* 148* 139*    Electrolytes Recent Labs  Lab 01/05/18 1547 01/06/18 0315  CALCIUM 9.5 9.0    CBC Recent Labs  Lab 01/05/18 1547 01/05/18 1551 01/06/18 0315  WBC 6.3  --  9.2  HGB 12.2 12.9 12.7  HCT 39.4 38.0 40.4  PLT 161  --  145*    Coag's Recent Labs  Lab 01/05/18 1547  APTT 31  INR 1.26    Sepsis Markers No results for input(s): LATICACIDVEN, PROCALCITON, O2SATVEN in the last 168 hours.  ABG Recent Labs  Lab 01/06/18 0027  PHART 7.370  PCO2ART 44.7  PO2ART 228.0*    Liver Enzymes Recent Labs  Lab 01/05/18 1547  AST 39  ALT 27  ALKPHOS 45  BILITOT 1.3*  ALBUMIN 3.8    Cardiac Enzymes No results for input(s): TROPONINI, PROBNP in the last 168 hours.  Glucose No results for input(s): GLUCAP in the last 168 hours.  Imaging Ct Angio Head W Or Wo Contrast  Result Date: 01/05/2018 CLINICAL DATA:  Right-sided gaze and unresponsive. EXAM: CT ANGIOGRAPHY HEAD AND NECK TECHNIQUE: Multidetector CT imaging of the head and neck was performed using the standard protocol during bolus administration of intravenous contrast. Multiplanar CT image reconstructions and MIPs were obtained to evaluate the vascular anatomy. Carotid stenosis measurements (when applicable) are obtained utilizing NASCET criteria, using the distal internal carotid diameter as the denominator. CONTRAST:  37mL ISOVUE-370 IOPAMIDOL (ISOVUE-370) INJECTION 76% COMPARISON:  Head CT 221319 FINDINGS: CTA NECK FINDINGS Aortic arch: There is moderate calcific atherosclerosis of the aortic arch. There is no aneurysm, dissection or hemodynamically significant stenosis of the visualized ascending aorta and aortic  arch. Conventional 3 vessel aortic branching pattern. The visualized proximal subclavian arteries are widely patent. Right carotid system: The right common carotid origin is widely patent. There is no common carotid or internal carotid artery dissection or aneurysm. Moderate atherosclerotic calcification at the carotid bifurcation without hemodynamically significant stenosis. Left carotid system: The left common carotid origin is widely patent. There is no common carotid or  internal carotid artery dissection or aneurysm. Moderate atherosclerotic calcification at the carotid bifurcation without hemodynamically significant stenosis. Vertebral arteries: The vertebral system is right dominant. Both vertebral artery origins are normal. Both vertebral arteries are normal to their confluence with the basilar artery. Skeleton: There is no bony spinal canal stenosis. No lytic or blastic lesions. Other neck: 3 cm right thyroid nodule. Upper chest: No pneumothorax or pleural effusion. No nodules or masses. Review of the MIP images confirms the above findings CTA HEAD FINDINGS Anterior circulation: --Intracranial internal carotid arteries: Atherosclerotic calcification of both internal carotid arteries at the skull base. --Anterior cerebral arteries: Normal. --Middle cerebral arteries: Normal. --Posterior communicating arteries: Present on the right, absent on the left. Posterior circulation: --Posterior cerebral arteries: Normal. --Superior cerebellar arteries: Normal. --Basilar artery: Normal. --Anterior inferior cerebellar arteries: Normal. --Posterior inferior cerebellar arteries: Normal. Venous sinuses: As permitted by contrast timing, patent. Anatomic variants: Fetal origin of the right PCA. Delayed phase: Not performed. Review of the MIP images confirms the above findings. IMPRESSION: 1. No emergent large vessel occlusion or high-grade stenosis.  The 2. Bilateral carotid bifurcation atherosclerosis without  hemodynamically significant stenosis. 3. Aortic Atherosclerosis (ICD10-I70.0).  The 4. 3 cm right thyroid nodule. Dedicated thyroid ultrasound recommended for further characterization on a nonemergent basis. Electronically Signed   By: Ulyses Jarred M.D.   On: 01/05/2018 16:53   Ct Angio Neck W Or Wo Contrast  Result Date: 01/05/2018 CLINICAL DATA:  Right-sided gaze and unresponsive. EXAM: CT ANGIOGRAPHY HEAD AND NECK TECHNIQUE: Multidetector CT imaging of the head and neck was performed using the standard protocol during bolus administration of intravenous contrast. Multiplanar CT image reconstructions and MIPs were obtained to evaluate the vascular anatomy. Carotid stenosis measurements (when applicable) are obtained utilizing NASCET criteria, using the distal internal carotid diameter as the denominator. CONTRAST:  29mL ISOVUE-370 IOPAMIDOL (ISOVUE-370) INJECTION 76% COMPARISON:  Head CT 221319 FINDINGS: CTA NECK FINDINGS Aortic arch: There is moderate calcific atherosclerosis of the aortic arch. There is no aneurysm, dissection or hemodynamically significant stenosis of the visualized ascending aorta and aortic arch. Conventional 3 vessel aortic branching pattern. The visualized proximal subclavian arteries are widely patent. Right carotid system: The right common carotid origin is widely patent. There is no common carotid or internal carotid artery dissection or aneurysm. Moderate atherosclerotic calcification at the carotid bifurcation without hemodynamically significant stenosis. Left carotid system: The left common carotid origin is widely patent. There is no common carotid or internal carotid artery dissection or aneurysm. Moderate atherosclerotic calcification at the carotid bifurcation without hemodynamically significant stenosis. Vertebral arteries: The vertebral system is right dominant. Both vertebral artery origins are normal. Both vertebral arteries are normal to their confluence with the basilar  artery. Skeleton: There is no bony spinal canal stenosis. No lytic or blastic lesions. Other neck: 3 cm right thyroid nodule. Upper chest: No pneumothorax or pleural effusion. No nodules or masses. Review of the MIP images confirms the above findings CTA HEAD FINDINGS Anterior circulation: --Intracranial internal carotid arteries: Atherosclerotic calcification of both internal carotid arteries at the skull base. --Anterior cerebral arteries: Normal. --Middle cerebral arteries: Normal. --Posterior communicating arteries: Present on the right, absent on the left. Posterior circulation: --Posterior cerebral arteries: Normal. --Superior cerebellar arteries: Normal. --Basilar artery: Normal. --Anterior inferior cerebellar arteries: Normal. --Posterior inferior cerebellar arteries: Normal. Venous sinuses: As permitted by contrast timing, patent. Anatomic variants: Fetal origin of the right PCA. Delayed phase: Not performed. Review of the MIP images confirms the above findings. IMPRESSION: 1. No  emergent large vessel occlusion or high-grade stenosis.  The 2. Bilateral carotid bifurcation atherosclerosis without hemodynamically significant stenosis. 3. Aortic Atherosclerosis (ICD10-I70.0).  The 4. 3 cm right thyroid nodule. Dedicated thyroid ultrasound recommended for further characterization on a nonemergent basis. Electronically Signed   By: Ulyses Jarred M.D.   On: 01/05/2018 16:53   Dg Chest Portable 1 View  Result Date: 01/05/2018 CLINICAL DATA:  Endotracheal tube and  gastric tube position EXAM: PORTABLE CHEST 1 VIEW COMPARISON:  11/30/2017 FINDINGS: Endotracheal tube in good position 2.5 cm above the carina. Gastric tube in the stomach. Dual lead pacemaker unchanged. CABG. Mild cardiac enlargement. Negative for heart failure or edema. Mild left lower lobe atelectasis. Negative for pneumonia. Negative for pleural effusion IMPRESSION: Endotracheal tube in good position. Mild left lower lobe atelectasis. Otherwise  the lungs are well aerated. Electronically Signed   By: Franchot Gallo M.D.   On: 01/05/2018 16:23   Ct Head Code Stroke Wo Contrast  Result Date: 01/05/2018 CLINICAL DATA:  Code stroke.  Unresponsive.  Right-sided gaze. EXAM: CT HEAD WITHOUT CONTRAST TECHNIQUE: Contiguous axial images were obtained from the base of the skull through the vertex without intravenous contrast. COMPARISON:  CT of the abdomen scratched at CT of the head 12/28/2017 FINDINGS: Brain: Moderate generalized atrophy and diffuse white matter disease is stable. No acute infarct, hemorrhage, or mass lesion is present. The ventricles are proportionate to the degree of atrophy. The sylvian fissure is asymmetric on the left, a chronic finding. Brainstem and cerebellum are normal. Vascular: Vascular calcifications are present within the cavernous internal carotid arteries bilaterally. There is no hyperdense vessel. Skull: Calvarium is intact. No focal lytic or blastic lesions are present. Sinuses/Orbits: The paranasal sinuses are clear. Bilateral lens replacements are present. Globes and orbits are within normal limits. ASPECTS [facility] Stroke Program Early CT Score) - Ganglionic level infarction (caudate, lentiform nuclei, internal capsule, insula, M1-M3 cortex): 7/7 - Supraganglionic infarction (M4-M6 cortex): 3/3 Total score (0-10 with 10 being normal): 10/10 IMPRESSION: 1. Stable atrophy and white matter disease. No acute intracranial abnormality. 2. ASPECTS is 10/10 The above was relayed via text pager to Dr. Kerney Elbe on 01/05/2018 at 16:03 . Electronically Signed   By: San Morelle M.D.   On: 01/05/2018 16:03      DISCUSSION: This is a 84 year old with a sudden change in level of consciousness and a history of diabetes and atrial fibrillation for which she normally is on Xarelto.  Is no evidence of glycemia precipitating the altered mental status.  CTA did not show a large vessel occlusion.  I have ordered an EEG considering  the possibility of seizures and initiated a modest dose of Keppra.  Rhythm monitoring will be in place to rule out a Stokes-Adams phenomenon.  ASSESSMENT / PLAN:  PULMONARY A: Intubated for respiratory protection Continue vent support Start weaning trials.  CARDIOVASCULAR A: Chronic A. fib, coronary artery disease Holding rate control agents and Eliquis for now.  ENDOCRINE A: Diabetes, hypothyroidism Holding oral diabetic medication.  Continue sliding scale insulin IV synthyroid. Follow TSH  INFECTIOUS DISEASE Lower leg cellulitis, treated with 2 courses of Keflex as an outpatient No evidence of active infection Monitor off antibiotics.  NEUROLOGIC A: Acute alteration in mental status.  ? Seizures Follow EEG Keppra MRI head if able. Need to see if pacemaker is compatible  FAMILY  -Patient was DNR prior to admission.  Dr. Pearline Cables spoke with the daughter yesterday and confirmed this.  No family at bedside on  2/14.  Addendum: Discussed with daughter. who arrived at the hospital.  She stated that patient is DNR.  No reintubation if she does get extubated.  Patient would not want to be on life support again They are open to palliative care discussion and consideration of hospice as the patient has had rapidly declining course as an outpatient.  Her mother-in-law had Alzheimer's and had gone through hospice so they are familiar with the process.  I will place a consult to palliative care.  The patient is critically ill with multiple organ system failure and requires high complexity decision making for assessment and support, frequent evaluation and titration of therapies, advanced monitoring, review of radiographic studies and interpretation of complex data.   Critical Care Time devoted to patient care services, exclusive of separately billable procedures, described in this note is 35 minutes.   Marshell Garfinkel MD [facility] Pager [phone] If no answer or  after 3pm call: [phone] 01/06/2018, 9:35 AM

## 2018-01-06 NOTE — Consult Note (Signed)
Palliative Care Progress Note  Ms. Croft is a 84 year old female with past medical history of dementia and  CAD status post pacemaker placement who presented after being found down at home and was subsequently intubated in the ED.  Since then, her family has been clear that her desire would not to be continue with mechanical ventilation and she was successfully extubated this morning.  I met today with the patient's daughter, son-in-law, and grandson. We discussed clinical course as well as wishes moving forward in regard to advanced directives.  Concepts specific to code status and care this hospitalization discussed.  We discussed difference between a aggressive medical intervention path and a palliative, comfort focused care path.  Values and goals of care important to patient and family were attempted to be elicited.  -DNR/DNI -Her family is clear that she is ready to die if it is her time.  Therefore, they would like to continue with current interventions but do not escalate care in the event of decompensation.  This includes not escalating past nasal cannula to more burdensome oxygen delivery methods such as nonrebreather or high flow nasal cannula. -We discussed that there is a good chance that she will worsen and may die this admission.  Her family is at peace with this as long as she is not suffering.  She has been clear with them in the past that she does not want to be kept alive by artificial means, nor would she want to pursue any overly aggressive measures.   -We are planning for a follow-up meeting tomorrow at 3 PM.  The goal is to see how she is doing following 24 hours after extubation to see if we can gain a better handle on her prognosis.  If she is continuing to decline, her family would like to shift to comfort and, at daughter's request, we discussed today the potential option of residential hospice if it appears that she is going to be in the last few weeks of her life and she is  stable enough to leave the hospital.  Questions and concerns addressed.   PMT will continue to support holistically.  Follow up family meeting tomorrow at 3PM  Total time: 60 minutes Greater than 50%  of this time was spent counseling and coordinating care related to the above assessment and plan.  Micheline Rough, MD [place] Team [phone]

## 2018-01-07 ENCOUNTER — Other Ambulatory Visit: Payer: Self-pay | Admitting: Internal Medicine

## 2018-01-07 ENCOUNTER — Inpatient Hospital Stay (HOSPITAL_COMMUNITY): Payer: Medicare Other

## 2018-01-07 DIAGNOSIS — R404 Transient alteration of awareness: Secondary | ICD-10-CM

## 2018-01-07 DIAGNOSIS — F0281 Dementia in other diseases classified elsewhere with behavioral disturbance: Secondary | ICD-10-CM

## 2018-01-07 DIAGNOSIS — G3183 Dementia with Lewy bodies: Secondary | ICD-10-CM

## 2018-01-07 LAB — MAGNESIUM: MAGNESIUM: 1.7 mg/dL (ref 1.7–2.4)

## 2018-01-07 LAB — BASIC METABOLIC PANEL
Anion gap: 12 (ref 5–15)
BUN: 21 mg/dL — AB (ref 6–20)
CALCIUM: 8.9 mg/dL (ref 8.9–10.3)
CO2: 24 mmol/L (ref 22–32)
CREATININE: 1.52 mg/dL — AB (ref 0.44–1.00)
Chloride: 107 mmol/L (ref 101–111)
GFR calc Af Amer: 34 mL/min — ABNORMAL LOW (ref >60)
GFR, EST NON AFRICAN AMERICAN: 29 mL/min — AB (ref >60)
GLUCOSE: 153 mg/dL — AB (ref 65–99)
Potassium: 4 mmol/L (ref 3.5–5.1)
SODIUM: 143 mmol/L (ref 135–145)

## 2018-01-07 LAB — CBC
HCT: 36.8 % (ref 36.0–46.0)
Hemoglobin: 11.3 g/dL — ABNORMAL LOW (ref 12.0–15.0)
MCH: 28.1 pg (ref 26.0–34.0)
MCHC: 30.7 g/dL (ref 30.0–36.0)
MCV: 91.5 fL (ref 78.0–100.0)
PLATELETS: 133 10*3/uL — AB (ref 150–400)
RBC: 4.02 MIL/uL (ref 3.87–5.11)
RDW: 14.6 % (ref 11.5–15.5)
WBC: 8.5 10*3/uL (ref 4.0–10.5)

## 2018-01-07 LAB — PHOSPHORUS: Phosphorus: 4.5 mg/dL (ref 2.5–4.6)

## 2018-01-07 LAB — RPR: RPR: NONREACTIVE

## 2018-01-07 LAB — GLUCOSE, CAPILLARY: Glucose-Capillary: 136 mg/dL — ABNORMAL HIGH (ref 65–99)

## 2018-01-07 MED ORDER — LORAZEPAM 1 MG PO TABS
1.0000 mg | ORAL_TABLET | ORAL | Status: DC | PRN
  Filled 2018-01-07: qty 1

## 2018-01-07 MED ORDER — MORPHINE SULFATE (PF) 4 MG/ML IV SOLN
1.0000 mg | INTRAVENOUS | Status: DC
  Administered 2018-01-07 – 2018-01-08 (×6): 1 mg via INTRAVENOUS
  Filled 2018-01-07 (×6): qty 1

## 2018-01-07 MED ORDER — ONDANSETRON HCL 4 MG/2ML IJ SOLN: 4.0000 mg | Freq: Four times a day (QID) | INTRAMUSCULAR | Status: DC | PRN

## 2018-01-07 MED ORDER — LORAZEPAM 2 MG/ML PO CONC: 1.0000 mg | ORAL | Status: DC | PRN

## 2018-01-07 MED ORDER — GLYCOPYRROLATE 0.2 MG/ML IJ SOLN
0.2000 mg | INTRAMUSCULAR | Status: DC | PRN
  Administered 2018-01-08 – 2018-01-10 (×3): 0.2 mg via INTRAVENOUS
  Filled 2018-01-07 (×2): qty 1

## 2018-01-07 MED ORDER — ONDANSETRON 4 MG PO TBDP: 4.0000 mg | ORAL_TABLET | Freq: Four times a day (QID) | ORAL | Status: DC | PRN

## 2018-01-07 MED ORDER — HALOPERIDOL LACTATE 5 MG/ML IJ SOLN
1.0000 mg | INTRAMUSCULAR | Status: DC | PRN
  Administered 2018-01-07: 1 mg via INTRAVENOUS
  Filled 2018-01-07: qty 1

## 2018-01-07 MED ORDER — LORAZEPAM 2 MG/ML IJ SOLN
1.0000 mg | INTRAMUSCULAR | Status: DC | PRN
  Administered 2018-01-07 – 2018-01-09 (×3): 1 mg via INTRAVENOUS
  Filled 2018-01-07 (×3): qty 1

## 2018-01-07 MED ORDER — HALOPERIDOL 1 MG PO TABS
1.0000 mg | ORAL_TABLET | ORAL | Status: DC | PRN
  Filled 2018-01-07: qty 1

## 2018-01-07 MED ORDER — ACETAMINOPHEN 325 MG PO TABS: 650.0000 mg | ORAL_TABLET | Freq: Four times a day (QID) | ORAL | Status: DC | PRN

## 2018-01-07 MED ORDER — GLYCOPYRROLATE 0.2 MG/ML IJ SOLN
0.2000 mg | INTRAMUSCULAR | Status: DC | PRN
  Filled 2018-01-07: qty 1

## 2018-01-07 MED ORDER — HALOPERIDOL LACTATE 2 MG/ML PO CONC
1.0000 mg | ORAL | Status: DC | PRN
  Filled 2018-01-07: qty 0.5

## 2018-01-07 MED ORDER — MORPHINE SULFATE (PF) 4 MG/ML IV SOLN
1.0000 mg | INTRAVENOUS | Status: DC | PRN
  Administered 2018-01-08 (×2): 1 mg via INTRAVENOUS
  Filled 2018-01-07 (×3): qty 1

## 2018-01-07 MED ORDER — GLYCOPYRROLATE 1 MG PO TABS
1.0000 mg | ORAL_TABLET | ORAL | Status: DC | PRN
  Filled 2018-01-07: qty 1

## 2018-01-07 MED ORDER — BIOTENE DRY MOUTH MT LIQD: 15.0000 mL | OROMUCOSAL | Status: DC | PRN

## 2018-01-07 MED ORDER — ACETAMINOPHEN 650 MG RE SUPP: 650.0000 mg | Freq: Four times a day (QID) | RECTAL | Status: DC | PRN

## 2018-01-07 MED ORDER — POLYVINYL ALCOHOL 1.4 % OP SOLN: 1.0000 [drp] | Freq: Four times a day (QID) | OPHTHALMIC | Status: DC | PRN

## 2018-01-07 MED ORDER — METOPROLOL SUCCINATE ER 200 MG PO TB24: ORAL_TABLET | ORAL | 0 refills | Status: DC

## 2018-01-07 NOTE — Progress Notes (Signed)
Palliative Care Progress Note  [name] is a 84 year old female with past medical history of dementia and  CAD status post pacemaker placement who presented after being found down at home and was subsequently intubated in the ED.  Since then, her family has been clear that her desire would not to be continue with mechanical ventilation and she was extubated yesterday.  I met today with the patient's daughter, son-in-law, grandson and granddaughter. Values and goals of care important to patient and family were attempted to be elicited.  We discussed clinical course over the past 24 hours.  Her daughter is clear that her mother has always maintained desire to "go" when it is her time.  Patient told her granddaughter that is was time for her to die and she has been speaking with deceased family, including her husband and brothers who died 30+ years ago.   Concepts specific to code status and care this hospitalization discussed. We discusseddifference between a aggressive medical intervention path and a palliative, comfort focused care path.  -DNR/DNI -Her family is clear if she were to understand her clinical condition her desire would  to be to focus on comfort if she were truly to understand her situation. -Plan for transition to full comfort care.  Will place order for transition from ICU.  If she stabilizes enough to consider transition from the hospital, we began discussion about residential hospice as a good option for her needs to ensure quality end-of-life care.   -Pain/shortness of breath: Plan for morphine 1 mg every 4 hours scheduled.  We will also plan for additional morphine 1-2 mg every 30 minutes as needed.  I would have a low threshold to initiate continuous infusion of morphine if needed to ensure her comfort. -Agitation: Haldol as needed -Anxiety: Ativan as needed -Secretions: Robinul as needed -We will continue with Keppra for seizure prophylaxis while inpatient. -We will  plan for follow-up tomorrow to continue evaluation for symptoms and discussion regarding possible residential hospice depending upon her clinical course overnight. -Discussed with PCCM.  Questions and concerns addressed. PMT will continue to support holistically.  Total time: 50 minutes Greater than 50% of this time was spent counseling and coordinating care related to the above assessment and plan.  Kathy Rough, MD [facility] Team [phone]

## 2018-01-07 NOTE — Procedures (Signed)
  ELECTROENCEPHALOGRAM REPORT  Date of Study: 01/07/18  Patient's Name: [name] MRN: [id number] Date of Birth: 1925-02-06  Referring Provider: Kerney Elbe, MD  Clinical History: [name] is a 84 y.o. 84 year old with dementia with a significant component of hallucinations since December who was recently admitted after found unresponsive. There was blood from the corner of her mouth but she had no witnessed tonic-clonic activity and no loss of bowel control.  He has no prior history of seizure activity.  She is a diabetic but she had eaten breakfast and her blood sugar after breakfast was 148.  According to family she does not have difficulties with intermittent hypoglycemia.  She has a significant cardiac history and has a pacemaker in place but family is not aware that she never had tachyarrhythmias or syncopal episodes related to arrhythmias.  She was brought to the department of emergency medicine where she was intubated for airway protection.  A CT scan of the head showed no acute changes and a CTA did not show any large vessel occlusions.    Medications: Scheduled Meds: . chlorhexidine gluconate (MEDLINE KIT)  15 mL Mouth Rinse BID  . insulin aspart  0-15 Units Subcutaneous TID WC  . midazolam  2 mg Intravenous Once   Continuous Infusions: . sodium chloride    . dextrose 5% lactated ringers 50 mL/hr at 01/06/18 2300  . levETIRAcetam Stopped (01/06/18 1703)   PRN Meds:.sodium chloride, ibuprofen, morphine injection            Technical Summary: This is a standard 16 channel EEG recording performed according to the international 10-20 electrode system.  AP bipolar, transverse bipolar, and referential montages were obtained, and digitally reformatted as necessary.  Duration of tracing: 24:42  Description: The patient is described as being semi-responsive for the study.  Background is disorganized and consists mostly of polymorphic 3-4 Hz delta activity, with  intermittent 5-6 Hz theta.  There is no well defined drowsiness or stage 2 sleep.  Background is occasionally interrupted by muscle artifact/movement.   Neither hyperventilation or photic stimulation were performed, however application of noxious stimulus does cause EEG muscle reactivity.  No definite epileptiform changes were noted.  EKG was monitored and noted to be sinus rhythym with an average heart rate of 90 bpm.  Impression: This is an abnormal EEG due to background slowing and disorganization seen throughout the tracing.  This is a non-specific finding that can be seen with toxic, metabolic, diffuse, or multifocal structural processes.  No definite epileptiform changes were noted.   A single EEG without epileptiform changes does not exclude the diagnosis of epilepsy. Clinical correlation advised.   Carvel Getting, M.D. Neurology Cell [phone]

## 2018-01-07 NOTE — Progress Notes (Signed)
1815 Received pt from 4N via bed. Non-verbal, involuntary movement of arms noted. Looks comfortable at this time. Oral care done. [place] on to cont low suction. Pt looks comfortable at this time.

## 2018-01-07 NOTE — Progress Notes (Signed)
PULMONARY / CRITICAL CARE MEDICINE   Name: [name] MRN: [id number] DOB: 1925-09-06    ADMISSION DATE:  01/05/2018   CHIEF COMPLAINT: Altered mental status  HISTORY OF PRESENT ILLNESS:   This is a 84 year old with dementia with a significant component of hallucinations since December.  She was found unresponsive today with her face flat on the table.  There was blood from the corner of her mouth but she had no witnessed tonic-clonic activity and no loss of bowel control.  He has no prior history of seizure activity.  She is a diabetic but she had eaten breakfast and her blood sugar after breakfast was 148.  According to family she does not have difficulties with intermittent hypoglycemia.  She has a significant cardiac history and has a pacemaker in place but family is not aware that she never had tachyarrhythmias or syncopal episodes related to arrhythmias.  She was brought to the department of emergency medicine where she was intubated for airway protection.  A CT scan of the head showed no acute changes and a CTA did not show any large vessel occlusions.  She is not responsible for sorting or taking her own medications and takes no medications without supervision.  There is no possibility of confusion with medicines of other people in the household.  SUBJECTIVE:  Pt. Arouses to call of name briefly , opens eyes, does not focus , does not follow any commands, does not track, she is calling out , reaching into the air  ? Hallucinations.She remains extubated on . Palliation is involved in goals of care discussion. Family expressing desire for comfort care  VITAL SIGNS: BP (!) 122/107 (BP Location: Right Arm)   Pulse (!) 58   Temp 98.3 F (36.8 C) (Oral)   Resp 14   Ht 5\' 7"  (1.702 m)   Wt 221 lb 1.9 oz (100.3 kg)   SpO2 97%   BMI 34.63 kg/m   HEMODYNAMICS:    VENTILATOR SETTINGS:    INTAKE / OUTPUT: I/O last 3 completed shifts: In: 2358.9 [I.V.:1938.9; IV  Piggyback:420] Out: 2950 [Urine:2300; Emesis/NG output:200; Other:450]  PHYSICAL EXAMINATION: Gen:     Elderly female supine in bed, somnolent, does not follow commands  HEENT: NCAT, negative JVD, nasal cannula Neck:     No masses; no thyromegaly Lungs:    Clear to auscultation bilaterally; occasional rhonchi which clears with cough, normal respiratory effort,  CV:         Regular rate and rhythm; no murmurs Abd:      + bowel sounds; soft, non-tender; no palpable masses, no distension Ext:    No edema; adequate peripheral perfusion, warm and dry, no mottling Skin:      Warm and dry; no rash Neuro: Arouses to call of name briefly,opens eyes briefly,  no focus or track, no following commands, speaking out incoherently, ? hallucinations    LABS:  BMET Recent Labs  Lab 01/05/18 1547 01/05/18 1551 01/06/18 0315 01/07/18 0502  NA 140 140 141 143  K 5.0 4.9 4.7 4.0  CL 105 104 106 107  CO2 22  --  23 24  BUN 28* 38* 27* 21*  CREATININE 1.59* 1.70* 1.44* 1.52*  GLUCOSE 153* 148* 139* 153*    Electrolytes Recent Labs  Lab 01/05/18 1547 01/06/18 0315 01/07/18 0502  CALCIUM 9.5 9.0 8.9  MG  --   --  1.7  PHOS  --   --  4.5    CBC Recent Labs  Lab  01/05/18 1547 01/05/18 1551 01/06/18 0315 01/07/18 0502  WBC 6.3  --  9.2 8.5  HGB 12.2 12.9 12.7 11.3*  HCT 39.4 38.0 40.4 36.8  PLT 161  --  145* 133*    Coag's Recent Labs  Lab 01/05/18 1547  APTT 31  INR 1.26    Sepsis Markers No results for input(s): LATICACIDVEN, PROCALCITON, O2SATVEN in the last 168 hours.  ABG Recent Labs  Lab 01/06/18 0027  PHART 7.370  PCO2ART 44.7  PO2ART 228.0*    Liver Enzymes Recent Labs  Lab 01/05/18 1547 01/06/18 0945  AST 39 63*  ALT 27 63*  ALKPHOS 45 59  BILITOT 1.3* 1.0  ALBUMIN 3.8 3.6    Cardiac Enzymes No results for input(s): TROPONINI, PROBNP in the last 168 hours.  Glucose Recent Labs  Lab 01/06/18 0858 01/07/18 0348  GLUCAP 118* 136*     Imaging Dg Chest Port 1 View  Result Date: 01/07/2018 CLINICAL DATA:  Respiratory failure EXAM: PORTABLE CHEST 1 VIEW COMPARISON:  January 05, 2018 FINDINGS: Endotracheal tube and nasogastric tube have been removed. Pacemaker leads are attached to the right atrium and right ventricle. No pneumothorax. There is cardiomegaly with pulmonary venous hypertension. There are small pleural effusions bilaterally. There are areas of patchy atelectasis in both mid and lower lung zones. There is no frank edema or consolidation. There is aortic atherosclerosis. No adenopathy. No bone lesions evident. IMPRESSION: Endotracheal tube and nasogastric tube removed. No pneumothorax. Pulmonary vascular congestion with small pleural effusions. Scattered areas of atelectatic change bilaterally. No frank edema or consolidation. Electronically Signed   By: Lowella Grip III M.D.   On: 01/07/2018 07:22      DISCUSSION: This is a 84 year old with a sudden change in level of consciousness and a history of diabetes and atrial fibrillation for which she normally is on Xarelto.  Is no evidence of glycemia precipitating the altered mental status.  CTA did not show a large vessel occlusion.  I have ordered an EEG considering the possibility of seizures and initiated a modest dose of Keppra.  Rhythm monitoring will be in place to rule out a Stokes-Adams phenomenon.  ASSESSMENT / PLAN:  PULMONARY A:  Extubated 2/14 with plans for no reintubation Remains on 2 L [place] with adequate saturations Plan: If Comfort Care is not initiated 2/15 after family conference then:  Do not escalate care, even to greater than  per palliation discussion 2/14 Maintain sats > 92% Anticipate comfort care after discussion with Palliation today at 3 pm         CARDIOVASCULAR A: Chronic A. fib, coronary artery disease Holding rate control agents and Eliquis for now DNR Rate controlled 2/15  Plan: If [place] is not initiated 2/15  after family conference then: Tele monitoring. Continue rhythm monitoring ? Transition to comfort care per Palliation Discussion 2/15 at 3 pm  Renal: Creatinine up trending Plan: If [place] is not initiated 2/15 after family conference then: Trend  BMET and urine output Replete electrolytes as needed  ENDOCRINE A: Diabetes, hypothyroidism  TSH 4.82 Plan: If [place] is not initiated 2/15 after family conference then: Continue Holding oral diabetic medication.   CBG's Q 4 Continue sliding scale insulin Add IV Synthroid 25 mcg daily Trend TSH  INFECTIOUS DISEASE T Max 100 Lower leg cellulitis, treated with 2 courses of Keflex as an outpatient No evidence of active infection Plan If [place] is not initiated 2/15 after family conference then: Monitor off antibiotics. Trend  CBC Culture as is clinically indicated  NEUROLOGIC A: Acute alteration in mental status.  ? Seizures 01/06/2018>> FBX:[id number]: This is an abnormal EEG due to background slowing and disorganization seen throughout the tracing.  This is a non-specific finding that can be seen with toxic, metabolic, diffuse, or multifocal structural processes.  No definite epileptiform changes were noted.   A single EEG without epileptiform changes does not exclude the diagnosis of epilepsy.  Plan:  If [place] is not initiated 2/15 after family conference then: Continue Keppra Monitor for seizures Doubt MRI is an option now patient is extubated  FAMILY  -Patient was DNR prior to admission.  Dr. Pearline Cables spoke with the daughter 2/14  and confirmed this. Palliation had goals of care discussion with family 2/14. Palliative to  discuss with family again 2/15. Plan is:  if no significant improvement to transition to comfort care with goal of  Hospice care.Based on my discussion this morning, fa,ily are leaning toward comfort care. Daughter states she is unable to take patient home. Will await goals of care  discussion to align plan of care appropriately.  Will transition to [facility] starting 01/08/2018. Please re-consult if further assistance needed.   Magdalen Spatz, AGACNP-BC [facility] Pager [phone] 01/07/2018, 9:43 AM

## 2018-01-07 NOTE — Progress Notes (Signed)
Subjective:  Patient in bed, with mental decline as compared to yesterday.  She is more obtunded and only minimally arousable with harsh stimuli.  Per family member at bedside, he states patient was much better yesterday and able to minimally converse with him. Right before family left she became very somnolent with abnormal movements of her extremities like she was having a nightmare and swatting at something - this occurred again today.  At baseline per her daughter at bedside, patient has visual and auditory hallucinations which started about 4 years ago.  She has waxing and waning episodes where she is either yelling and screaming at something or laying in bed minimally responsive. When she is not affected by an episode of cognitive fluctuation, she is able to ambulate and partake in activities of daily living with minmal assistance  Daughter at bedside states patient is DNR and does not want any further intensive medical management.  They are leaning towards comfort measures and hospice and will discuss with palliative care around 2 PM.  Exam: Vitals:   01/07/18 0800 01/07/18 0900  BP: (!) 122/107   Pulse: (!) 59 (!) 58  Resp: 14 14  Temp: 98.3 F (36.8 C)   SpO2: 100% 97%    Physical Exam  HEENT-  Normocephalic, no lesions, without obvious abnormality.  Normal external eye and conjunctiva.   Cardiovascular-V paced on monitor, with audible S1-S2, pacemaker in left chest Lungs-no increased work of breathing, clear breath sounds and diminished in the bases. Musculoskeletal-no joint tenderness, deformity or swelling Skin-warm and dry  Neuro:  Mental Status: Patient within 30 minutes of having been given morphine 4 mg.  Patient obtunded but appears to be having nightmares/visual hallucinations.  She has periods of clenching her teeth grimacing her face, swatting in the air with her arms.  Mental status has declined from yesterday, whereby she is only arousable with harsh or noxious stimuli  and then goes back to sleep immediately.  Cranial Nerves: II: Unable to definitively assess visual fields. PERRL III,IV, VI: Ptosis not present V,VII: Patient does grimace to harsh stimuli.   VIII: hearing intact to voice and does respond when you call her name IX,X: Unable to assess XI: Unable to assess XII: Unable to assess Motor: Patient not moving her extremities to command, but does move her arms and minimally moves her feet spontaneously. Tone and bulk: Decreased tone throughout; no atrophy noted Sensory: Exclaims and moves feet in response to plantar stimulation Deep Tendon Reflexes: Hypoactive Plantars: Right: downgoing   Left: downgoing Cerebellar: Unable to assess  Gait: Unable to assess     Current Facility-Administered Medications:  .  0.9 %  sodium chloride infusion, 250 mL, Intravenous, PRN, Sampson Goon, MD .  chlorhexidine gluconate (MEDLINE KIT) (PERIDEX) 0.12 % solution 15 mL, 15 mL, Mouth Rinse, BID, Deterding, Guadelupe Sabin, MD, 15 mL at 01/07/18 0827 .  dextrose 5 % in lactated ringers infusion, , Intravenous, Continuous, Sampson Goon, MD, Last Rate: 50 mL/hr at 01/07/18 1200 .  ibuprofen (ADVIL,MOTRIN) 100 MG/5ML suspension 200 mg, 200 mg, Oral, Q6H PRN, Mannam, Praveen, MD .  insulin aspart (novoLOG) injection 0-15 Units, 0-15 Units, Subcutaneous, TID WC, Sampson Goon, MD .  levETIRAcetam (KEPPRA) 500 mg in sodium chloride 0.9 % 100 mL IVPB, 500 mg, Intravenous, Q12H, Sampson Goon, MD, Stopped at 01/07/18 [phone] .  midazolam (VERSED) injection 2 mg, 2 mg, Intravenous, Once, Mannam, Praveen, MD .  morphine 4 MG/ML injection 1-2 mg, 1-2 mg,  Intravenous, Q3H PRN, Rigoberto Noel, MD, 1 mg at 01/07/18 0825   Pertinent Labs/Diagnostics:  Ct Angio Head/Neck W Or Wo Contrast  01/05/2018 IMPRESSION:  1. No emergent large vessel occlusion or high-grade stenosis.    2. Bilateral carotid bifurcation atherosclerosis without hemodynamically significant stenosis.  3.  Aortic Atherosclerosis (ICD10-I70.0).    4. 3 cm right thyroid nodule. Dedicated thyroid ultrasound recommended for further characterization on a nonemergent basis.   Ct Head Code Stroke Wo Contrast 01/05/2018 IMPRESSION:  1. Stable atrophy and white matter disease. No acute intracranial abnormality.  2. ASPECTS is 10/10   EEG 01/06/18 Description: The patient is described as being semi-responsive for the study.  Background is disorganized and consists mostly of polymorphic 3-4 Hz delta activity, with intermittent 5-6 Hz theta.  There is no well defined drowsiness or stage 2 sleep.  Background is occasionally interrupted by muscle artifact/movement.   Neither hyperventilation or photic stimulation were performed, however application of noxious stimulus does cause EEG muscle reactivity.  No definite epileptiform changes were noted.  EKG was monitored and noted to be sinus rhythym with an average heart rate of 90 bpm. Impression: This is an abnormal EEG due to background slowing and disorganization seen throughout the tracing.  This is a non-specific finding that can be seen with toxic, metabolic, diffuse, or multifocal structural processes.  No definite epileptiform changes were noted.   A single EEG without epileptiform changes does not exclude the diagnosis of epilepsy. Clinical correlation advised.   Assessment: 84 y.o. female with dementia presenting with acute onset of agitated delirium 1.  Acute onset of agitated delirium.  On exam did not note any asymmetry, the patient was obtunded, appears to be having nightmares/hallucinations.  She would clench her teeth or swat things with her hands, grimace and mumble a few words and then go back to sleep.  Patient did not appear to tense up her extremities.  Mental status has declined since yesterday night per family.  Differential diagnosis includes: severe cognitive fluctuation in the setting of possible Lewy body dementia; small acute bithalamic strokes  not yet visible on CT head.  Patient with pacemaker.  CTA and CT of the head did not show any LVO.  Ammonia mildly elevated. Seizures have been ruled out with EEG showing no epileptiform discharges.  Metabolic workup within normal limits.  Patient with mildly elevated ammonia levels. Patient's family leaning more to comfort measures and hospice with palliative care meeting scheduled for 2 p.m. this afternoon.   2. Dementia. Given her recent development of visual hallucinations as well as cognitive fluctuations, Lewy body dementia is relatively high on the DDx. On memantine-donepezil combination pill at home.  During assessment patient noted to be having hallucinations 3. Paroxysmal atrial fibrillation on Eliquis.  4. SSS s/p Medtronic pacemaker.  Documentation in chart patient with a [employer]onic AICD.  5. CKD-chronic 6. Thyroid Nodule -incidental finding on CTA head and neck, patient was elevated TSH please consider workup 7. Stroke Risk Factors -  CAD, prior CVA, HLD and HTN   Plan: - Continue Eliquis - Continue memantine-donepezil combination pill   - Gentle IV hydration - Telemetry monitoring - Frequent neuro checks - Pt is DNR and doesn't want intubation - Palliative care consult pending. Hold off on MRI and LTM EEG for now.  Jacob Moores DNP Neuro-hospitalist Team [phone] 01/07/2018, 10:12 AM    Electronically signed: Dr. Kerney Elbe

## 2018-01-07 NOTE — Progress Notes (Signed)
SLP Cancellation Note  Patient Details Name: [name] MRN: [id number] DOB: 03-25-1925   Cancelled treatment:       Reason Eval/Treat Not Completed: Patient not medically ready.    Sheyna Pettibone, Katherene Ponto 01/07/2018, 11:47 AM

## 2018-01-08 DIAGNOSIS — Z515 Encounter for palliative care: Secondary | ICD-10-CM

## 2018-01-08 DIAGNOSIS — J9600 Acute respiratory failure, unspecified whether with hypoxia or hypercapnia: Secondary | ICD-10-CM

## 2018-01-08 DIAGNOSIS — R4182 Altered mental status, unspecified: Secondary | ICD-10-CM

## 2018-01-08 MED ORDER — LORAZEPAM 2 MG/ML IJ SOLN
0.5000 mg | Freq: Three times a day (TID) | INTRAMUSCULAR | Status: DC
  Administered 2018-01-08 – 2018-01-09 (×3): 0.5 mg via INTRAVENOUS
  Filled 2018-01-08 (×3): qty 1

## 2018-01-08 MED ORDER — WHITE PETROLATUM EX OINT
TOPICAL_OINTMENT | CUTANEOUS | Status: AC
  Administered 2018-01-08
  Filled 2018-01-08: qty 28.35

## 2018-01-08 MED ORDER — MORPHINE SULFATE (PF) 4 MG/ML IV SOLN
2.0000 mg | INTRAVENOUS | Status: DC
  Administered 2018-01-08 – 2018-01-09 (×4): 2 mg via INTRAVENOUS
  Filled 2018-01-08 (×5): qty 1

## 2018-01-08 MED ORDER — GLYCOPYRROLATE 0.2 MG/ML IJ SOLN
0.2000 mg | Freq: Three times a day (TID) | INTRAMUSCULAR | Status: DC
  Administered 2018-01-08 – 2018-01-10 (×6): 0.2 mg via INTRAVENOUS
  Filled 2018-01-08 (×6): qty 1

## 2018-01-08 NOTE — Progress Notes (Signed)
Daily Progress Note   Patient Name: [name] [name] Gonzales       Date: 01/08/2018 DOB: [birth year]  Age: 84 y.o. MRN#: [id number] Attending Physician: Kathy Gravel, MD Primary Care Physician: [name] Rail, MD Admit Date: 01/05/2018  Reason for Consultation/Follow-up: Establishing goals of care, Inpatient hospice referral, Non pain symptom management, Pain control, Psychosocial/spiritual support and Terminal Care  Subjective: Patient seen, chart reviewed.  Patient's daughter and granddaughter, son-in-law, at the bedside.  Son-in-law shares that patient was restless, disrobing, last night.  Family shares they are worried that she is anxious and that her respiratory status appears labored  Length of Stay: 3  Current Medications: Scheduled Meds:  . chlorhexidine gluconate (MEDLINE KIT)  15 mL Mouth Rinse BID  . glycopyrrolate  0.2 mg Intravenous Q8H  . LORazepam  0.5 mg Intravenous Q8H  . midazolam  2 mg Intravenous Once  .  morphine injection  2 mg Intravenous Q4H    Continuous Infusions: . levETIRAcetam 500 mg (01/08/18 0602)    PRN Meds: acetaminophen **OR** acetaminophen, antiseptic oral rinse, glycopyrrolate **OR** glycopyrrolate **OR** glycopyrrolate, haloperidol **OR** haloperidol **OR** haloperidol lactate, ibuprofen, LORazepam **OR** [DISCONTINUED] LORazepam **OR** LORazepam, morphine injection, ondansetron **OR** ondansetron (ZOFRAN) IV, polyvinyl alcohol  Physical Exam  Constitutional: She appears well-developed and well-nourished.  Acutely ill-appearing elderly female; she appears to be transitioning towards end of life  HENT:  Head: Normocephalic and atraumatic.  Cardiovascular:  Irregular  Pulmonary/Chest:  Cheyne-Stokes respiratory pattern with frequent 10-15-second apnea    Abdominal: Soft.  Neurological:  Unresponsive  Skin: Skin is warm and dry. There is pallor.  Psychiatric:  Unable to test  Nursing note and vitals reviewed.           Vital Signs: BP 122/60 (BP Location: Left Arm)   Pulse 61   Temp 98.2 F (36.8 C) (Axillary)   Resp 19   Ht '5\' 7"'$  (1.702 m)   Wt 100.3 kg (221 lb 1.9 oz)   SpO2 92%   BMI 34.63 kg/m  SpO2: SpO2: 92 % O2 Device: O2 Device: Not Delivered O2 Flow Rate: O2 Flow Rate (L/min): 2 L/min  Intake/output summary:   Intake/Output Summary (Last 24 hours) at 01/08/2018 1647 Last data filed at 01/08/2018 1554 Gross per 24 hour  Intake 80 ml  Output 625 ml  Net -545  ml   LBM: Last BM Date: (pta) Baseline Weight: Weight: 98.2 kg (216 lb 7.9 oz) Most recent weight: Weight: 100.3 kg (221 lb 1.9 oz)       Palliative Assessment/Data:    Flowsheet Rows     Most Recent Value  Intake Tab  Referral Department  Critical care  Unit at Time of Referral  ICU  Palliative Care Primary Diagnosis  Pulmonary  Date Notified  01/06/18  Date of Admission  01/05/18  Date first seen by Palliative Care  01/06/18  # of days Palliative referral response time  0 Day(s)  # of days IP prior to Palliative referral  1  Clinical Assessment  Palliative Performance Scale Score  10%  Psychosocial & Spiritual Assessment  Palliative Care Outcomes  Patient/Family meeting held?  Yes  Who was at the meeting?  daughter, son in law, grandson      Patient Active Problem List   Diagnosis Date Noted  . Altered mental status   . Altered consciousness 01/05/2018  . Neuropathy 09/06/2017  . Overactive bladder 02/02/2017  . CKD (chronic kidney disease) stage 3, GFR 30-59 ml/min (HCC) 11/03/2016  . PND (post-nasal drip) 11/03/2016  . Anxiety and depression 06/22/2016  . History of total knee arthroplasty 07/19/2015  . At risk for sleep apnea 07/17/2015  . Memory loss 04/05/2014  . Encounter for long-term (current) use of other medications  07/29/2012  . Diabetes ([place]) 04/28/2012  . CVA (cerebral vascular accident) ([place])   . EDEMA- LOCALIZED 01/06/2011  . Chronic systolic heart failure ([place]) 12/26/2010  . GERD 04/30/2010  . TRANSIENT ISCHEMIC ATTACKS, HX OF 04/30/2010  . DYSPEPSIA 04/14/2010  . Generalized weakness 03/19/2010  . CONSTIPATION 02/13/2010  . ANEMIA, MILD 01/14/2010  . Hyperlipidemia 09/16/2008  . HYPERTENSION, BENIGN 09/16/2008  . CAD, AUTOLOGOUS BYPASS GRAFT 09/16/2008  . SICK SINUS/ TACHY-BRADY SYNDROME 09/16/2008  . PACEMAKER, PERMANENT 09/16/2008  . Hypothyroidism 06/14/2007  . COLONIC POLYPS, HX OF 03/23/2007    Palliative Care Assessment & Plan   Patient Profile: [name] Gonzales is a 84 year old female with past medical history of dementia and CAD status post pacemaker placement who presented after being found down at home and was subsequently intubated in the ED. Since then, her family has been clear that her desire would not to be continue with mechanical ventilation and she was extubated yesterday.  Consult for goals of care   Assessment: Patient exhibiting a Cheyne-Stokes respiratory pattern with frequent, 10-second apnea.  Airway initially sounded tight, wheezy but was relieved with head repositioning, moving patient up in the bed.  Ativan has been effective Per bedside RN, in eliminating involuntary movements of upper extremities Family shared that they would like to pursue residential hospice.  Offered choice per Medicare guidelines.  Their first choice would be [facility] but would take first bed availability between [facility] or [facility].  Unfortunately, neither facility has a bed today  Educated family on signs and symptoms that are seen as someone nears end of life, such as apnea, mottling, changes to level of consciousness  Recommendations/Plan:  Dyspnea: We will increase morphine to 2 mg every 4 hours and continue every 2 hours for breakthrough  symptoms.  Continue with targeted pulmonary treatments such as oxygen via nasal cannula only, nebulizer treatments  Anxiety: We will start low-dose scheduled Ativan 0.5 mg every 8 hours as well as continued as needed dosing  Secretions: Mild upper airway secretions noted.  Will start  scheduled Robinul 0.2 mg IV every 8 hours  Order placed to social work for residential hospice  Goals of Care and Additional Recommendations:  Limitations on Scope of Treatment: Full Comfort Care  Code Status:    Code Status Orders  (From admission, onward)        Start     Ordered   01/07/18 1521  Do not attempt resuscitation (DNR)  Continuous    Question Answer Comment  In the event of cardiac or respiratory ARREST Do not call a "code blue"   In the event of cardiac or respiratory ARREST Do not perform Intubation, CPR, defibrillation or ACLS   In the event of cardiac or respiratory ARREST Use medication by any route, position, wound care, and other measures to relive pain and suffering. May use oxygen, suction and manual treatment of airway obstruction as needed for comfort.   Comments NO CPR      01/07/18 1527    Code Status History    Date Active Date Inactive Code Status Order ID Comments User Context   01/05/2018 19:47 01/07/2018 15:27 DNR [id number]  Sampson Goon, MD ED   07/19/2015 12:15 07/22/2015 16:50 Full Code [id number]  Latanya Maudlin, MD Inpatient   03/18/2012 09:14 03/18/2012 15:32 Full Code [id number]  Garnette Gunner, RN Inpatient       Prognosis:   < 2 weeks dementia, coronary artery disease, persistent encephalopathy; patient is symptomatic with dyspnea, mild agitation, unable to take anything by mouth  Discharge Planning:  Hospice facility  Care plan was discussed with Dr Domingo Cocking  Thank you for allowing the Palliative Medicine Team to assist in the care of this patient.   Time In: 0830 Time Out: 930 Total Time 60 min Prolonged Time Billed  no       Greater than 50%   of this time was spent counseling and coordinating care related to the above assessment and plan.  Dory Horn, NP  Please contact Palliative Medicine Team phone at [phone] for questions and concerns.

## 2018-01-08 NOTE — Progress Notes (Signed)
Patient ID: [name], female   DOB: October 21, 1925, 84 y.o.   MRN: [id number]                                                                PROGRESS NOTE                                                                                                                                                                                                             Patient Demographics:    [name], is a 84 y.o. female, DOB - 1925-11-08, OEV:[id number]  Admit date - 01/05/2018   Admitting Physician Sampson Goon, MD  Outpatient Primary MD for the patient is Binnie Rail, MD  LOS - 3  Outpatient Specialists:    Chief Complaint  Patient presents with  . Code Stroke       Brief Narrative    84 year old with dementia with a significant component of hallucinations since December.  She was found unresponsive today with her face flat on the table.  There was blood from the corner of her mouth but she had no witnessed tonic-clonic activity and no loss of bowel control.  He has no prior history of seizure activity.  She is a diabetic but she had eaten breakfast and her blood sugar after breakfast was 148.  According to family she does not have difficulties with intermittent hypoglycemia.  She has a significant cardiac history and has a pacemaker in place but family is not aware that she never had tachyarrhythmias or syncopal episodes related to arrhythmias.  She was brought to the department of emergency medicine where she was intubated for airway protection.  A CT scan of the head showed no acute changes and a CTA did not show any large vessel occlusions.  She is not responsible for sorting or taking her own medications and takes no medications without supervision.  There is no possibility of confusion with medicines of other people in the household     Subjective:    [name] today is much more comfortable . The family is greatful.  Appreciate palliative care input.      Assessment  & Plan :     Active Problems:   Altered consciousness   AMS Family wants apparently CMO Appreciate palliative care input I  have asked the family whether they would like the pacer turned off.  Daughter would like to wait so that possibly some other family members can see her.  We are currently awaiting a bed at hospice, [facility] or in [facility].       Code Status : DNR  Family Communication  : w daughter  Disposition Plan  : hospice house  Barriers For Discharge : lack of bed at hospice house currently. Family would like her to be in hospital til transfer possible  Consults  :  Palliative, neurology  Procedures  :   DVT Prophylaxis  :  SCD  Lab Results  Component Value Date   PLT 133 (L) 01/07/2018    Antibiotics  :  none  Anti-infectives (From admission, onward)   None        Objective:   Vitals:   01/07/18 1500 01/07/18 1600 01/07/18 1815 01/08/18 0427  BP: 125/62 (!) 98/45 126/65 122/60  Pulse:  (!) 55 68 61  Resp: '15 16  19  '$ Temp:  99.3 F (37.4 C)  98.2 F (36.8 C)  TempSrc:  Axillary  Axillary  SpO2:   (!) 86% 92%  Weight:      Height:        Wt Readings from Last 3 Encounters:  01/07/18 100.3 kg (221 lb 1.9 oz)  11/30/17 97.1 kg (214 lb)  10/26/17 97.3 kg (214 lb 8 oz)     Intake/Output Summary (Last 24 hours) at 01/08/2018 0723 Last data filed at 01/08/2018 0435 Gross per 24 hour  Intake 400 ml  Output 575 ml  Net -175 ml     Physical Exam  Awake Alert, Oriented X 0, No new F.N deficits, Normal affect [place].AT,PERRAL Supple Neck,No JVD, No cervical lymphadenopathy appriciated.  Symmetrical Chest wall movement, Good air movement bilaterally, CTAB RRR,No Gallops,Rubs or new Murmurs, No Parasternal Heave +ve B.Sounds, Abd Soft, No tenderness, No organomegaly appriciated, No rebound - guarding or rigidity. No Cyanosis, Clubbing or edema, No new Rash or bruise      Data Review:    CBC Recent Labs  Lab 01/05/18 1547 01/05/18 1551  01/06/18 0315 01/07/18 0502  WBC 6.3  --  9.2 8.5  HGB 12.2 12.9 12.7 11.3*  HCT 39.4 38.0 40.4 36.8  PLT 161  --  145* 133*  MCV 91.6  --  91.0 91.5  MCH 28.4  --  28.6 28.1  MCHC 31.0  --  31.4 30.7  RDW 14.5  --  14.3 14.6  LYMPHSABS 2.0  --   --   --   MONOABS 0.6  --   --   --   EOSABS 0.2  --   --   --   BASOSABS 0.0  --   --   --     Chemistries  Recent Labs  Lab 01/05/18 1547 01/05/18 1551 01/06/18 0315 01/06/18 0945 01/07/18 0502  NA 140 140 141  --  143  K 5.0 4.9 4.7  --  4.0  CL 105 104 106  --  107  CO2 22  --  23  --  24  GLUCOSE 153* 148* 139*  --  153*  BUN 28* 38* 27*  --  21*  CREATININE 1.59* 1.70* 1.44*  --  1.52*  CALCIUM 9.5  --  9.0  --  8.9  MG  --   --   --   --  1.7  AST 39  --   --  63*  --   ALT 27  --   --  63*  --   ALKPHOS 45  --   --  59  --   BILITOT 1.3*  --   --  1.0  --    ------------------------------------------------------------------------------------------------------------------ No results for input(s): CHOL, HDL, LDLCALC, TRIG, CHOLHDL, LDLDIRECT in the last 72 hours.  Lab Results  Component Value Date   HGBA1C 7.1 (H) 01/05/2018   ------------------------------------------------------------------------------------------------------------------ Recent Labs    01/06/18 0315  TSH 4.827*   ------------------------------------------------------------------------------------------------------------------ Recent Labs    01/06/18 0945  VITAMINB12 286    Coagulation profile Recent Labs  Lab 01/05/18 1547  INR 1.26    No results for input(s): DDIMER in the last 72 hours.  Cardiac Enzymes No results for input(s): CKMB, TROPONINI, MYOGLOBIN in the last 168 hours.  Invalid input(s): CK ------------------------------------------------------------------------------------------------------------------    Component Value Date/Time   BNP 116.8 (H) 11/30/2017 1321    Inpatient Medications  Scheduled Meds: .  chlorhexidine gluconate (MEDLINE KIT)  15 mL Mouth Rinse BID  . midazolam  2 mg Intravenous Once  .  morphine injection  1 mg Intravenous Q4H   Continuous Infusions: . levETIRAcetam 500 mg (01/08/18 0602)   PRN Meds:.acetaminophen **OR** acetaminophen, antiseptic oral rinse, glycopyrrolate **OR** glycopyrrolate **OR** glycopyrrolate, haloperidol **OR** haloperidol **OR** haloperidol lactate, ibuprofen, LORazepam **OR** [DISCONTINUED] LORazepam **OR** LORazepam, morphine injection, ondansetron **OR** ondansetron (ZOFRAN) IV, polyvinyl alcohol  Micro Results Recent Results (from the past 240 hour(s))  MRSA PCR Screening     Status: None   Collection Time: 01/05/18 10:28 PM  Result Value Ref Range Status   MRSA by PCR NEGATIVE NEGATIVE Final    Comment:        The GeneXpert MRSA Assay (FDA approved for NASAL specimens only), is one component of a comprehensive MRSA colonization surveillance program. It is not intended to diagnose MRSA infection nor to guide or monitor treatment for MRSA infections. Performed at [facility], [place] [address], [place], [place] [address]     Radiology Reports Ct Angio Head W Or Wo Contrast  Result Date: 01/05/2018 CLINICAL DATA:  Right-sided gaze and unresponsive. EXAM: CT ANGIOGRAPHY HEAD AND NECK TECHNIQUE: Multidetector CT imaging of the head and neck was performed using the standard protocol during bolus administration of intravenous contrast. Multiplanar CT image reconstructions and MIPs were obtained to evaluate the vascular anatomy. Carotid stenosis measurements (when applicable) are obtained utilizing NASCET criteria, using the distal internal carotid diameter as the denominator. CONTRAST:  40m ISOVUE-370 IOPAMIDOL (ISOVUE-370) INJECTION 76% COMPARISON:  Head CT 221319 FINDINGS: CTA NECK FINDINGS Aortic arch: There is moderate calcific atherosclerosis of the aortic arch. There is no aneurysm, dissection or hemodynamically significant stenosis  of the visualized ascending aorta and aortic arch. Conventional 3 vessel aortic branching pattern. The visualized proximal subclavian arteries are widely patent. Right carotid system: The right common carotid origin is widely patent. There is no common carotid or internal carotid artery dissection or aneurysm. Moderate atherosclerotic calcification at the carotid bifurcation without hemodynamically significant stenosis. Left carotid system: The left common carotid origin is widely patent. There is no common carotid or internal carotid artery dissection or aneurysm. Moderate atherosclerotic calcification at the carotid bifurcation without hemodynamically significant stenosis. Vertebral arteries: The vertebral system is right dominant. Both vertebral artery origins are normal. Both vertebral arteries are normal to their confluence with the basilar artery. Skeleton: There is no bony spinal canal stenosis. No lytic or blastic lesions. Other neck: 3 cm  right thyroid nodule. Upper chest: No pneumothorax or pleural effusion. No nodules or masses. Review of the MIP images confirms the above findings CTA HEAD FINDINGS Anterior circulation: --Intracranial internal carotid arteries: Atherosclerotic calcification of both internal carotid arteries at the skull base. --Anterior cerebral arteries: Normal. --Middle cerebral arteries: Normal. --Posterior communicating arteries: Present on the right, absent on the left. Posterior circulation: --Posterior cerebral arteries: Normal. --Superior cerebellar arteries: Normal. --Basilar artery: Normal. --Anterior inferior cerebellar arteries: Normal. --Posterior inferior cerebellar arteries: Normal. Venous sinuses: As permitted by contrast timing, patent. Anatomic variants: Fetal origin of the right PCA. Delayed phase: Not performed. Review of the MIP images confirms the above findings. IMPRESSION: 1. No emergent large vessel occlusion or high-grade stenosis.  The 2. Bilateral carotid  bifurcation atherosclerosis without hemodynamically significant stenosis. 3. Aortic Atherosclerosis (ICD10-I70.0).  The 4. 3 cm right thyroid nodule. Dedicated thyroid ultrasound recommended for further characterization on a nonemergent basis. Electronically Signed   By: Ulyses Jarred M.D.   On: 01/05/2018 16:53   Dg Thoracic Spine 2 View  Result Date: 12/28/2017 CLINICAL DATA:  Pain after a fall EXAM: THORACIC SPINE 2 VIEWS COMPARISON:  Radiograph 11/30/2017 FINDINGS: Post sternotomy changes and cardiac pacing leads. Surgical clips in the right upper quadrant. Thoracic alignment is within normal limits. Diffuse degenerative changes with osteophytes. Vertebral body heights are maintained. IMPRESSION: No definite acute osseous abnormality. Electronically Signed   By: Donavan Foil M.D.   On: 12/28/2017 21:14   Dg Lumbar Spine Complete  Result Date: 12/28/2017 CLINICAL DATA:  Pain after fall EXAM: LUMBAR SPINE - COMPLETE 4+ VIEW COMPARISON:  None. FINDINGS: SI joints are symmetric. Surgical clips in the right upper quadrant. Aortic atherosclerosis. Lumbar alignment within normal limits. Vertebral body heights are normal. Mild degenerative disc changes at L1-L2 and L2-L3 with moderate degenerative changes at L4-L5 and L5-S1. Posterior facet arthropathy L3 through S1. IMPRESSION: 1. Multilevel degenerative changes.  No acute osseous abnormality. Electronically Signed   By: Donavan Foil M.D.   On: 12/28/2017 21:17   Ct Head Wo Contrast  Result Date: 12/28/2017 CLINICAL DATA:  Pain after fall. EXAM: CT HEAD WITHOUT CONTRAST CT CERVICAL SPINE WITHOUT CONTRAST TECHNIQUE: Multidetector CT imaging of the head and cervical spine was performed following the standard protocol without intravenous contrast. Multiplanar CT image reconstructions of the cervical spine were also generated. COMPARISON:  Mar 28, 2016 CT of the brain FINDINGS: CT HEAD FINDINGS Brain: No subdural, epidural, or subarachnoid hemorrhage. Ventricles  and sulci are stable. White matter changes are unchanged. No acute cortical ischemia or infarct. Cerebellum, brainstem, and basal cisterns are normal. No mass effect midline shift. Vascular: Calcified atherosclerosis in the intracranial carotids. Skull: Normal. Negative for fracture or focal lesion. Sinuses/Orbits: Chronic opacification of the left mastoid air cells and middle ear. Opacification of several superior right mastoid air cells is chronic as well. Paranasal sinuses and right middle ear are well aerated. Other: None. CT CERVICAL SPINE FINDINGS Alignment: Straightening of normal lordosis.  No other malalignment. Skull base and vertebrae: No acute fracture. No primary bone lesion or focal pathologic process. Soft tissues and spinal canal: Calcified atherosclerosis identified in the carotid arteries. The right thyroid lobe is enlarged and may contain a nodule as it is lower in attenuation than the left thyroid lobe. No other soft tissue abnormalities identified. No prevertebral soft tissue swelling. The cervical canal is grossly patent. Disc levels:  Multilevel degenerative changes. Upper chest: Negative. Other: No other abnormalities. IMPRESSION: 1. No acute intracranial abnormality  identified. 2. No fracture or traumatic malalignment in the cervical spine. Degenerative changes. 3. Enlargement of the right thyroid lobe, which may contain a nodule/mass. Ultrasound could further evaluate if clinically warranted. Electronically Signed   By: Dorise Bullion III M.D   On: 12/28/2017 21:14   Ct Angio Neck W Or Wo Contrast  Result Date: 01/05/2018 CLINICAL DATA:  Right-sided gaze and unresponsive. EXAM: CT ANGIOGRAPHY HEAD AND NECK TECHNIQUE: Multidetector CT imaging of the head and neck was performed using the standard protocol during bolus administration of intravenous contrast. Multiplanar CT image reconstructions and MIPs were obtained to evaluate the vascular anatomy. Carotid stenosis measurements (when  applicable) are obtained utilizing NASCET criteria, using the distal internal carotid diameter as the denominator. CONTRAST:  68m ISOVUE-370 IOPAMIDOL (ISOVUE-370) INJECTION 76% COMPARISON:  Head CT 221319 FINDINGS: CTA NECK FINDINGS Aortic arch: There is moderate calcific atherosclerosis of the aortic arch. There is no aneurysm, dissection or hemodynamically significant stenosis of the visualized ascending aorta and aortic arch. Conventional 3 vessel aortic branching pattern. The visualized proximal subclavian arteries are widely patent. Right carotid system: The right common carotid origin is widely patent. There is no common carotid or internal carotid artery dissection or aneurysm. Moderate atherosclerotic calcification at the carotid bifurcation without hemodynamically significant stenosis. Left carotid system: The left common carotid origin is widely patent. There is no common carotid or internal carotid artery dissection or aneurysm. Moderate atherosclerotic calcification at the carotid bifurcation without hemodynamically significant stenosis. Vertebral arteries: The vertebral system is right dominant. Both vertebral artery origins are normal. Both vertebral arteries are normal to their confluence with the basilar artery. Skeleton: There is no bony spinal canal stenosis. No lytic or blastic lesions. Other neck: 3 cm right thyroid nodule. Upper chest: No pneumothorax or pleural effusion. No nodules or masses. Review of the MIP images confirms the above findings CTA HEAD FINDINGS Anterior circulation: --Intracranial internal carotid arteries: Atherosclerotic calcification of both internal carotid arteries at the skull base. --Anterior cerebral arteries: Normal. --Middle cerebral arteries: Normal. --Posterior communicating arteries: Present on the right, absent on the left. Posterior circulation: --Posterior cerebral arteries: Normal. --Superior cerebellar arteries: Normal. --Basilar artery: Normal. --Anterior  inferior cerebellar arteries: Normal. --Posterior inferior cerebellar arteries: Normal. Venous sinuses: As permitted by contrast timing, patent. Anatomic variants: Fetal origin of the right PCA. Delayed phase: Not performed. Review of the MIP images confirms the above findings. IMPRESSION: 1. No emergent large vessel occlusion or high-grade stenosis.  The 2. Bilateral carotid bifurcation atherosclerosis without hemodynamically significant stenosis. 3. Aortic Atherosclerosis (ICD10-I70.0).  The 4. 3 cm right thyroid nodule. Dedicated thyroid ultrasound recommended for further characterization on a nonemergent basis. Electronically Signed   By: KUlyses JarredM.D.   On: 01/05/2018 16:53   Ct Cervical Spine Wo Contrast  Result Date: 12/28/2017 CLINICAL DATA:  Pain after fall. EXAM: CT HEAD WITHOUT CONTRAST CT CERVICAL SPINE WITHOUT CONTRAST TECHNIQUE: Multidetector CT imaging of the head and cervical spine was performed following the standard protocol without intravenous contrast. Multiplanar CT image reconstructions of the cervical spine were also generated. COMPARISON:  Mar 28, 2016 CT of the brain FINDINGS: CT HEAD FINDINGS Brain: No subdural, epidural, or subarachnoid hemorrhage. Ventricles and sulci are stable. White matter changes are unchanged. No acute cortical ischemia or infarct. Cerebellum, brainstem, and basal cisterns are normal. No mass effect midline shift. Vascular: Calcified atherosclerosis in the intracranial carotids. Skull: Normal. Negative for fracture or focal lesion. Sinuses/Orbits: Chronic opacification of the left mastoid air cells and  middle ear. Opacification of several superior right mastoid air cells is chronic as well. Paranasal sinuses and right middle ear are well aerated. Other: None. CT CERVICAL SPINE FINDINGS Alignment: Straightening of normal lordosis.  No other malalignment. Skull base and vertebrae: No acute fracture. No primary bone lesion or focal pathologic process. Soft  tissues and spinal canal: Calcified atherosclerosis identified in the carotid arteries. The right thyroid lobe is enlarged and may contain a nodule as it is lower in attenuation than the left thyroid lobe. No other soft tissue abnormalities identified. No prevertebral soft tissue swelling. The cervical canal is grossly patent. Disc levels:  Multilevel degenerative changes. Upper chest: Negative. Other: No other abnormalities. IMPRESSION: 1. No acute intracranial abnormality identified. 2. No fracture or traumatic malalignment in the cervical spine. Degenerative changes. 3. Enlargement of the right thyroid lobe, which may contain a nodule/mass. Ultrasound could further evaluate if clinically warranted. Electronically Signed   By: Dorise Bullion III M.D   On: 12/28/2017 21:14   Dg Chest Port 1 View  Result Date: 01/07/2018 CLINICAL DATA:  Respiratory failure EXAM: PORTABLE CHEST 1 VIEW COMPARISON:  January 05, 2018 FINDINGS: Endotracheal tube and nasogastric tube have been removed. Pacemaker leads are attached to the right atrium and right ventricle. No pneumothorax. There is cardiomegaly with pulmonary venous hypertension. There are small pleural effusions bilaterally. There are areas of patchy atelectasis in both mid and lower lung zones. There is no frank edema or consolidation. There is aortic atherosclerosis. No adenopathy. No bone lesions evident. IMPRESSION: Endotracheal tube and nasogastric tube removed. No pneumothorax. Pulmonary vascular congestion with small pleural effusions. Scattered areas of atelectatic change bilaterally. No frank edema or consolidation. Electronically Signed   By: Lowella Grip III M.D.   On: 01/07/2018 07:22   Dg Chest Portable 1 View  Result Date: 01/05/2018 CLINICAL DATA:  Endotracheal tube and  gastric tube position EXAM: PORTABLE CHEST 1 VIEW COMPARISON:  11/30/2017 FINDINGS: Endotracheal tube in good position 2.5 cm above the carina. Gastric tube in the stomach.  Dual lead pacemaker unchanged. CABG. Mild cardiac enlargement. Negative for heart failure or edema. Mild left lower lobe atelectasis. Negative for pneumonia. Negative for pleural effusion IMPRESSION: Endotracheal tube in good position. Mild left lower lobe atelectasis. Otherwise the lungs are well aerated. Electronically Signed   By: Franchot Gallo M.D.   On: 01/05/2018 16:23   Ct Head Code Stroke Wo Contrast  Result Date: 01/05/2018 CLINICAL DATA:  Code stroke.  Unresponsive.  Right-sided gaze. EXAM: CT HEAD WITHOUT CONTRAST TECHNIQUE: Contiguous axial images were obtained from the base of the skull through the vertex without intravenous contrast. COMPARISON:  CT of the abdomen scratched at CT of the head 12/28/2017 FINDINGS: Brain: Moderate generalized atrophy and diffuse white matter disease is stable. No acute infarct, hemorrhage, or mass lesion is present. The ventricles are proportionate to the degree of atrophy. The sylvian fissure is asymmetric on the left, a chronic finding. Brainstem and cerebellum are normal. Vascular: Vascular calcifications are present within the cavernous internal carotid arteries bilaterally. There is no hyperdense vessel. Skull: Calvarium is intact. No focal lytic or blastic lesions are present. Sinuses/Orbits: The paranasal sinuses are clear. Bilateral lens replacements are present. Globes and orbits are within normal limits. ASPECTS [facility] Stroke Program Early CT Score) - Ganglionic level infarction (caudate, lentiform nuclei, internal capsule, insula, M1-M3 cortex): 7/7 - Supraganglionic infarction (M4-M6 cortex): 3/3 Total score (0-10 with 10 being normal): 10/10 IMPRESSION: 1. Stable atrophy and white matter disease.  No acute intracranial abnormality. 2. ASPECTS is 10/10 The above was relayed via text pager to Dr. Kerney Elbe on 01/05/2018 at 16:03 . Electronically Signed   By: San Morelle M.D.   On: 01/05/2018 16:03    Time Spent in minutes  30   Jani Gravel  M.D on 01/08/2018 at 7:23 AM  Between 7am to 7pm - Pager - [phone]    After 7pm go to [email or web address] - password [facility]  [facility] -  Office  [phone]

## 2018-01-09 MED ORDER — LORAZEPAM 2 MG/ML IJ SOLN
1.0000 mg | INTRAMUSCULAR | Status: DC | PRN
  Administered 2018-01-09: 2 mg via INTRAVENOUS
  Administered 2018-01-10: 1 mg via INTRAVENOUS
  Administered 2018-01-10: 2 mg via INTRAVENOUS
  Filled 2018-01-09 (×2): qty 1

## 2018-01-09 MED ORDER — LORAZEPAM 2 MG/ML IJ SOLN: 0.5000 mg | INTRAMUSCULAR | Status: DC

## 2018-01-09 MED ORDER — LORAZEPAM 1 MG PO TABS: 1.0000 mg | ORAL_TABLET | ORAL | Status: DC | PRN

## 2018-01-09 MED ORDER — LORAZEPAM 2 MG/ML IJ SOLN
1.0000 mg | INTRAMUSCULAR | Status: DC
  Administered 2018-01-09 – 2018-01-10 (×5): 1 mg via INTRAVENOUS
  Filled 2018-01-09 (×6): qty 1

## 2018-01-09 MED ORDER — SODIUM CHLORIDE 0.9 % IV SOLN
2.0000 mg/h | INTRAVENOUS | Status: DC
  Administered 2018-01-09: 1 mg/h via INTRAVENOUS
  Filled 2018-01-09: qty 10

## 2018-01-09 MED ORDER — MORPHINE BOLUS VIA INFUSION
4.0000 mg | INTRAVENOUS | Status: DC | PRN
  Administered 2018-01-10: 1 mg via INTRAVENOUS
  Administered 2018-01-10: 4 mg via INTRAVENOUS
  Administered 2018-01-10: 2 mg via INTRAVENOUS
  Filled 2018-01-09: qty 4

## 2018-01-09 MED ORDER — MORPHINE BOLUS VIA INFUSION
2.0000 mg | INTRAVENOUS | Status: DC | PRN
  Administered 2018-01-09 (×3): 2 mg via INTRAVENOUS
  Filled 2018-01-09: qty 2

## 2018-01-09 NOTE — Progress Notes (Signed)
Daily Progress Note   Patient Name: [name]       Date: 01/09/2018 DOB: [birth year]  Age: 84 y.o. MRN#: [id number] Attending Physician: Jani Gravel, MD Primary Care Physician: Binnie Rail, MD Admit Date: 01/05/2018  Reason for Consultation/Follow-up: Inpatient hospice referral, Non pain symptom management, Pain control, Psychosocial/spiritual support and Terminal Care  Subjective: Patient seen, chart reviewed.  Patient and grandson are in the room.  Patient's granddaughter was able to visit on 01/08/2018 and did well according to her father.  They verbalized they still are seeing some breakthrough agitation and shortness of breath  Length of Stay: 4  Current Medications: Scheduled Meds:  . chlorhexidine gluconate (MEDLINE KIT)  15 mL Mouth Rinse BID  . glycopyrrolate  0.2 mg Intravenous Q8H  . LORazepam  1 mg Intravenous Q4H  . midazolam  2 mg Intravenous Once    Continuous Infusions: . levETIRAcetam Stopped (01/09/18 0559)  . morphine      PRN Meds: acetaminophen **OR** acetaminophen, antiseptic oral rinse, glycopyrrolate **OR** glycopyrrolate **OR** glycopyrrolate, haloperidol **OR** haloperidol **OR** haloperidol lactate, LORazepam **OR** [DISCONTINUED] LORazepam **OR** LORazepam, morphine, ondansetron **OR** ondansetron (ZOFRAN) IV, polyvinyl alcohol  Physical Exam  Constitutional: She appears well-developed and well-nourished.  Acutely ill-appearing elderly female; she is actively dying  HENT:  Head: Normocephalic and atraumatic.  Neck: Normal range of motion.  Cardiovascular:  Atrial fib  Pulmonary/Chest:  Increased work of breathing  Abdominal: Soft.  Neurological:  Unresponsive to voice and light touch  Skin: Skin is warm and dry. There is pallor.  No mottling   Psychiatric:  Psychomotor restlessness observed Patient disrobing, trying to throw her legs over the side of the bed  Nursing note and vitals reviewed.           Vital Signs: BP 132/66 (BP Location: Left Arm)   Pulse 87   Temp (!) 97.4 F (36.3 C) (Axillary)   Resp 19   Ht '5\' 7"'$  (1.702 m)   Wt 100.3 kg (221 lb 1.9 oz)   SpO2 (!) 86%   BMI 34.63 kg/m  SpO2: SpO2: (!) 86 % O2 Device: O2 Device: Not Delivered O2 Flow Rate: O2 Flow Rate (L/min): 2 L/min  Intake/output summary:   Intake/Output Summary (Last 24 hours) at 01/09/2018 1203 Last data filed at 01/09/2018 0559 Gross per 24  hour  Intake 100 ml  Output 900 ml  Net -800 ml   LBM: Last BM Date: (pta) Baseline Weight: Weight: 98.2 kg (216 lb 7.9 oz) Most recent weight: Weight: 100.3 kg (221 lb 1.9 oz)       Palliative Assessment/Data:    Flowsheet Rows     Most Recent Value  Intake Tab  Referral Department  Critical care  Unit at Time of Referral  ICU  Palliative Care Primary Diagnosis  Pulmonary  Date Notified  01/06/18  Date of Admission  01/05/18  Date first seen by Palliative Care  01/06/18  # of days Palliative referral response time  0 Day(s)  # of days IP prior to Palliative referral  1  Clinical Assessment  Palliative Performance Scale Score  10%  Psychosocial & Spiritual Assessment  Palliative Care Outcomes  Patient/Family meeting held?  Yes  Who was at the meeting?  daughter, son in law, grandson      Patient Active Problem List   Diagnosis Date Noted  . Altered mental status   . Acute respiratory failure ([place])   . Palliative care by specialist   . Altered consciousness 01/05/2018  . Neuropathy 09/06/2017  . Overactive bladder 02/02/2017  . CKD (chronic kidney disease) stage 3, GFR 30-59 ml/min ([facility]) 11/03/2016  . PND (post-nasal drip) 11/03/2016  . Anxiety and depression 06/22/2016  . History of total knee arthroplasty 07/19/2015  . At risk for sleep apnea 07/17/2015  . Memory loss  04/05/2014  . Goals of care, counseling/discussion 07/29/2012  . Diabetes ([place]) 04/28/2012  . CVA (cerebral vascular accident) ([place])   . EDEMA- LOCALIZED 01/06/2011  . Chronic systolic heart failure ([place]) 12/26/2010  . GERD 04/30/2010  . TRANSIENT ISCHEMIC ATTACKS, HX OF 04/30/2010  . DYSPEPSIA 04/14/2010  . Generalized weakness 03/19/2010  . CONSTIPATION 02/13/2010  . ANEMIA, MILD 01/14/2010  . Hyperlipidemia 09/16/2008  . HYPERTENSION, BENIGN 09/16/2008  . CAD, AUTOLOGOUS BYPASS GRAFT 09/16/2008  . SICK SINUS/ TACHY-BRADY SYNDROME 09/16/2008  . PACEMAKER, PERMANENT 09/16/2008  . Hypothyroidism 06/14/2007  . COLONIC POLYPS, HX OF 03/23/2007    Palliative Care Assessment & Plan   Patient Profile: Ms. League is a 84 year old female with past medical history of dementia and CAD status post pacemaker placement who presented after being found down at home and was subsequently intubated in the ED. Since then, her family has been clear that her desire would not to be continue with mechanical ventilation and she wasextubated yesterday.  Consult for goals of care Patient is now full comfort care     Assessment: Patient exhibiting restlessness, questionable end-of-life delirium as well as increased work of breathing.  Spoke to hospice representatives from [facility] as well as hospice and palliative care of [facility] and there is no bed availability in either facility today  Recommendations/Plan:  Dyspnea: We will start morphine continuous infusion at 1 mg an hour and 2 mg every 20 minutes as needed for breakthrough; continue oxygen via nasal cannula, nebulizer treatments  Secretions: Improving continue with scheduled Robinul  Agitation: Needs improvement; increase Ativan IV 1 mg every 4 hours scheduled as well as every 4 hours as needed  Pain: We will start morphine continuous infusion as noted above  Goals of Care and Additional Recommendations:  Limitations  on Scope of Treatment: Full Comfort Care  Code Status:    Code Status Orders  (From admission, onward)        Start     Ordered  01/07/18 1521  Do not attempt resuscitation (DNR)  Continuous    Question Answer Comment  In the event of cardiac or respiratory ARREST Do not call a "code blue"   In the event of cardiac or respiratory ARREST Do not perform Intubation, CPR, defibrillation or ACLS   In the event of cardiac or respiratory ARREST Use medication by any route, position, wound care, and other measures to relive pain and suffering. May use oxygen, suction and manual treatment of airway obstruction as needed for comfort.   Comments NO CPR      01/07/18 1527    Code Status History    Date Active Date Inactive Code Status Order ID Comments User Context   01/05/2018 19:47 01/07/2018 15:27 DNR [id number]  Sampson Goon, MD ED   07/19/2015 12:15 07/22/2015 16:50 Full Code [id number]  Latanya Maudlin, MD Inpatient   03/18/2012 09:14 03/18/2012 15:32 Full Code [id number]  Garnette Gunner, RN Inpatient       Prognosis:   Hours - Days  Discharge Planning:  Anticipated Hospital Death  Care plan was discussed with Dr. Maudie Mercury  Thank you for allowing the Palliative Medicine Team to assist in the care of this patient.   Time In: 1000 Time Out: 1035 Total Time 35 min Prolonged Time Billed  no       Greater than 50%  of this time was spent counseling and coordinating care related to the above assessment and plan.  Dory Horn, NP  Please contact Palliative Medicine Team phone at [phone] for questions and concerns.

## 2018-01-09 NOTE — Plan of Care (Signed)
  Clinical Measurements: Cardiovascular complication will be avoided 01/09/2018 0123 - Progressing by Irish Lack, RN   Clinical Measurements: Respiratory complications will improve 01/09/2018 0123 - Progressing by Irish Lack, RN

## 2018-01-09 NOTE — Progress Notes (Signed)
Patient ID: [name], female   DOB: [birth year], 84 y.o.   MRN: [id number]                                                                PROGRESS NOTE                                                                                                                                                                                                             Patient Demographics:    [name], is a 84 y.o. female, DOB - 02/22/1925, JTT:[id number]  Admit date - 01/05/2018   Admitting Physician Sampson Goon, MD  Outpatient Primary MD for the patient is Binnie Rail, MD  LOS - 4  Outpatient Specialists:      Chief Complaint  Patient presents with  . Code Stroke       Brief Narrative   84 year old with dementia with a significant component of hallucinations since December. She was found unresponsive today with her face flat on the table. There was blood from the corner of her mouth but she had no witnessed tonic-clonic activity and no loss of bowel control. He has no prior history of seizure activity. She is a diabetic but she had eaten breakfast and her blood sugar after breakfast was 148. According to family she does not have difficulties with intermittent hypoglycemia. She has a significant cardiac history and has a pacemaker in place but family is not aware that she never had tachyarrhythmias or syncopal episodes related to arrhythmias. She was brought to the department of emergency medicine where she was intubated for airway protection. A CT scan of the head showed no acute changes and a CTA did not show any large vessel occlusions. She is not responsible for sorting or taking her own medications and takes no medications without supervision. There is no possibility of confusion with medicines of other people in the household       Subjective:    [name] today has been comfortable over nite. NP from palliative recommended continuous morphine gtt.  Pt family in agreement.  CMO,  Awaiting placement at possibly [facility] if.  Pt is not eating.  Unresponsive.  At baseline severe dementia w hallucination/ agitation    Assessment  & Plan :  Active Problems:   Goals of care, counseling/discussion   Altered consciousness   Altered mental status   Acute respiratory failure (HCC)   Palliative care by specialist      AMS, Severe dementia w hallucinations and unresponsivenss Family wants CMO Start Morphine Gtt today Appreciate palliative care input I have asked the family whether they would like the pacer turned off. Family prefers to keep on for now Palliative doesn't think the pacer will make a difference We are currently awaiting a bed at hospice, at [facility] or in [facility].       Code Status : DNR  Family Communication  : w daughter  Disposition Plan  : hospice house  Barriers For Discharge : lack of bed at hospice house currently. Family would like her to be in hospital til transfer possible  Consults  :  Palliative, neurology  Procedures  :   DVT Prophylaxis  :  SCD  RecentLabs       Lab Results  Component Value Date   PLT 133 (L) 01/07/2018      Antibiotics  :  none         Objective:   Vitals:   01/07/18 1600 01/07/18 1815 01/08/18 0427 01/09/18 0431  BP: (!) 98/45 126/65 122/60 132/66  Pulse: (!) 55 68 61 87  Resp: _0 Temp: 99.3 F (37.4 C)  98.2 F (36.8 C) (!) 97.4 F (36.3 C)  TempSrc: Axillary  Axillary Axillary  SpO2:  (!) 86% 92% (!) 86%  Weight:      Height:        Wt Readings from Last 3 Encounters:  01/07/18 100.3 kg (221 lb 1.9 oz)  11/30/17 97.1 kg (214 lb)  10/26/17 97.3 kg (214 lb 8 oz)     Intake/Output Summary (Last 24 hours) at 01/09/2018 1102 Last data filed at 01/09/2018 0559 Gross per 24 hour  Intake 100 ml  Output 900 ml  Net -800 ml     Physical Exam  Awake Alert, Oriented X 0, No new F.N deficits, Normal affect [place].AT,PERRAL Supple Neck,No  JVD, No cervical lymphadenopathy appriciated.  Symmetrical Chest wall movement, Good air movement bilaterally, CTAB RRR,No Gallops,Rubs or new Murmurs, No Parasternal Heave +ve B.Sounds, Abd Soft, No tenderness, No organomegaly appriciated, No rebound - guarding or rigidity. No Cyanosis, Clubbing or edema, No new Rash or bruise      Data Review:    CBC Recent Labs  Lab 01/05/18 1547 01/05/18 1551 01/06/18 0315 01/07/18 0502  WBC 6.3  --  9.2 8.5  HGB 12.2 12.9 12.7 11.3*  HCT 39.4 38.0 40.4 36.8  PLT 161  --  145* 133*  MCV 91.6  --  91.0 91.5  MCH 28.4  --  28.6 28.1  MCHC 31.0  --  31.4 30.7  RDW 14.5  --  14.3 14.6  LYMPHSABS 2.0  --   --   --   MONOABS 0.6  --   --   --   EOSABS 0.2  --   --   --   BASOSABS 0.0  --   --   --     Chemistries  Recent Labs  Lab 01/05/18 1547 01/05/18 1551 01/06/18 0315 01/06/18 0945 01/07/18 0502  NA 140 140 141  --  143  K 5.0 4.9 4.7  --  4.0  CL 105 104 106  --  107  CO2 22  --  23  --  24  GLUCOSE 153* 148* 139*  --  153*  BUN 28* 38* 27*  --  21*  CREATININE 1.59* 1.70* 1.44*  --  1.52*  CALCIUM 9.5  --  9.0  --  8.9  MG  --   --   --   --  1.7  AST 39  --   --  63*  --   ALT 27  --   --  63*  --   ALKPHOS 45  --   --  59  --   BILITOT 1.3*  --   --  1.0  --    ------------------------------------------------------------------------------------------------------------------ No results for input(s): CHOL, HDL, LDLCALC, TRIG, CHOLHDL, LDLDIRECT in the last 72 hours.  Lab Results  Component Value Date   HGBA1C 7.1 (H) 01/05/2018   ------------------------------------------------------------------------------------------------------------------ No results for input(s): TSH, T4TOTAL, T3FREE, THYROIDAB in the last 72 hours.  Invalid input(s): FREET3 ------------------------------------------------------------------------------------------------------------------ No results for input(s): VITAMINB12, FOLATE, FERRITIN,  TIBC, IRON, RETICCTPCT in the last 72 hours.  Coagulation profile Recent Labs  Lab 01/05/18 1547  INR 1.26    No results for input(s): DDIMER in the last 72 hours.  Cardiac Enzymes No results for input(s): CKMB, TROPONINI, MYOGLOBIN in the last 168 hours.  Invalid input(s): CK ------------------------------------------------------------------------------------------------------------------    Component Value Date/Time   BNP 116.8 (H) 11/30/2017 1321    Inpatient Medications  Scheduled Meds: . chlorhexidine gluconate (MEDLINE KIT)  15 mL Mouth Rinse BID  . glycopyrrolate  0.2 mg Intravenous Q8H  . LORazepam  1 mg Intravenous Q4H  . midazolam  2 mg Intravenous Once   Continuous Infusions: . levETIRAcetam Stopped (01/09/18 0559)  . morphine     PRN Meds:.acetaminophen **OR** acetaminophen, antiseptic oral rinse, glycopyrrolate **OR** glycopyrrolate **OR** glycopyrrolate, haloperidol **OR** haloperidol **OR** haloperidol lactate, LORazepam **OR** [DISCONTINUED] LORazepam **OR** LORazepam, morphine, ondansetron **OR** ondansetron (ZOFRAN) IV, polyvinyl alcohol  Micro Results Recent Results (from the past 240 hour(s))  MRSA PCR Screening     Status: None   Collection Time: 01/05/18 10:28 PM  Result Value Ref Range Status   MRSA by PCR NEGATIVE NEGATIVE Final    Comment:        The GeneXpert MRSA Assay (FDA approved for NASAL specimens only), is one component of a comprehensive MRSA colonization surveillance program. It is not intended to diagnose MRSA infection nor to guide or monitor treatment for MRSA infections. Performed at [facility], [place] [address] [place], [place] [address]     Radiology Reports Ct Angio Head W Or Wo Contrast  Result Date: 01/05/2018 CLINICAL DATA:  Right-sided gaze and unresponsive. EXAM: CT ANGIOGRAPHY HEAD AND NECK TECHNIQUE: Multidetector CT imaging of the head and neck was performed using the standard protocol during bolus  administration of intravenous contrast. Multiplanar CT image reconstructions and MIPs were obtained to evaluate the vascular anatomy. Carotid stenosis measurements (when applicable) are obtained utilizing NASCET criteria, using the distal internal carotid diameter as the denominator. CONTRAST:  39m ISOVUE-370 IOPAMIDOL (ISOVUE-370) INJECTION 76% COMPARISON:  Head CT 221319 FINDINGS: CTA NECK FINDINGS Aortic arch: There is moderate calcific atherosclerosis of the aortic arch. There is no aneurysm, dissection or hemodynamically significant stenosis of the visualized ascending aorta and aortic arch. Conventional 3 vessel aortic branching pattern. The visualized proximal subclavian arteries are widely patent. Right carotid system: The right common carotid origin is widely patent. There is no common carotid or internal carotid artery dissection or aneurysm. Moderate atherosclerotic calcification at the carotid bifurcation without hemodynamically significant stenosis.  Left carotid system: The left common carotid origin is widely patent. There is no common carotid or internal carotid artery dissection or aneurysm. Moderate atherosclerotic calcification at the carotid bifurcation without hemodynamically significant stenosis. Vertebral arteries: The vertebral system is right dominant. Both vertebral artery origins are normal. Both vertebral arteries are normal to their confluence with the basilar artery. Skeleton: There is no bony spinal canal stenosis. No lytic or blastic lesions. Other neck: 3 cm right thyroid nodule. Upper chest: No pneumothorax or pleural effusion. No nodules or masses. Review of the MIP images confirms the above findings CTA HEAD FINDINGS Anterior circulation: --Intracranial internal carotid arteries: Atherosclerotic calcification of both internal carotid arteries at the skull base. --Anterior cerebral arteries: Normal. --Middle cerebral arteries: Normal. --Posterior communicating arteries: Present on  the right, absent on the left. Posterior circulation: --Posterior cerebral arteries: Normal. --Superior cerebellar arteries: Normal. --Basilar artery: Normal. --Anterior inferior cerebellar arteries: Normal. --Posterior inferior cerebellar arteries: Normal. Venous sinuses: As permitted by contrast timing, patent. Anatomic variants: Fetal origin of the right PCA. Delayed phase: Not performed. Review of the MIP images confirms the above findings. IMPRESSION: 1. No emergent large vessel occlusion or high-grade stenosis.  The 2. Bilateral carotid bifurcation atherosclerosis without hemodynamically significant stenosis. 3. Aortic Atherosclerosis (ICD10-I70.0).  The 4. 3 cm right thyroid nodule. Dedicated thyroid ultrasound recommended for further characterization on a nonemergent basis. Electronically Signed   By: Ulyses Jarred M.D.   On: 01/05/2018 16:53   Dg Thoracic Spine 2 View  Result Date: 12/28/2017 CLINICAL DATA:  Pain after a fall EXAM: THORACIC SPINE 2 VIEWS COMPARISON:  Radiograph 11/30/2017 FINDINGS: Post sternotomy changes and cardiac pacing leads. Surgical clips in the right upper quadrant. Thoracic alignment is within normal limits. Diffuse degenerative changes with osteophytes. Vertebral body heights are maintained. IMPRESSION: No definite acute osseous abnormality. Electronically Signed   By: Donavan Foil M.D.   On: 12/28/2017 21:14   Dg Lumbar Spine Complete  Result Date: 12/28/2017 CLINICAL DATA:  Pain after fall EXAM: LUMBAR SPINE - COMPLETE 4+ VIEW COMPARISON:  None. FINDINGS: SI joints are symmetric. Surgical clips in the right upper quadrant. Aortic atherosclerosis. Lumbar alignment within normal limits. Vertebral body heights are normal. Mild degenerative disc changes at L1-L2 and L2-L3 with moderate degenerative changes at L4-L5 and L5-S1. Posterior facet arthropathy L3 through S1. IMPRESSION: 1. Multilevel degenerative changes.  No acute osseous abnormality. Electronically Signed   By:  Donavan Foil M.D.   On: 12/28/2017 21:17   Ct Head Wo Contrast  Result Date: 12/28/2017 CLINICAL DATA:  Pain after fall. EXAM: CT HEAD WITHOUT CONTRAST CT CERVICAL SPINE WITHOUT CONTRAST TECHNIQUE: Multidetector CT imaging of the head and cervical spine was performed following the standard protocol without intravenous contrast. Multiplanar CT image reconstructions of the cervical spine were also generated. COMPARISON:  Mar 28, 2016 CT of the brain FINDINGS: CT HEAD FINDINGS Brain: No subdural, epidural, or subarachnoid hemorrhage. Ventricles and sulci are stable. White matter changes are unchanged. No acute cortical ischemia or infarct. Cerebellum, brainstem, and basal cisterns are normal. No mass effect midline shift. Vascular: Calcified atherosclerosis in the intracranial carotids. Skull: Normal. Negative for fracture or focal lesion. Sinuses/Orbits: Chronic opacification of the left mastoid air cells and middle ear. Opacification of several superior right mastoid air cells is chronic as well. Paranasal sinuses and right middle ear are well aerated. Other: None. CT CERVICAL SPINE FINDINGS Alignment: Straightening of normal lordosis.  No other malalignment. Skull base and vertebrae: No acute fracture. No  primary bone lesion or focal pathologic process. Soft tissues and spinal canal: Calcified atherosclerosis identified in the carotid arteries. The right thyroid lobe is enlarged and may contain a nodule as it is lower in attenuation than the left thyroid lobe. No other soft tissue abnormalities identified. No prevertebral soft tissue swelling. The cervical canal is grossly patent. Disc levels:  Multilevel degenerative changes. Upper chest: Negative. Other: No other abnormalities. IMPRESSION: 1. No acute intracranial abnormality identified. 2. No fracture or traumatic malalignment in the cervical spine. Degenerative changes. 3. Enlargement of the right thyroid lobe, which may contain a nodule/mass. Ultrasound  could further evaluate if clinically warranted. Electronically Signed   By: Dorise Bullion III M.D   On: 12/28/2017 21:14   Ct Angio Neck W Or Wo Contrast  Result Date: 01/05/2018 CLINICAL DATA:  Right-sided gaze and unresponsive. EXAM: CT ANGIOGRAPHY HEAD AND NECK TECHNIQUE: Multidetector CT imaging of the head and neck was performed using the standard protocol during bolus administration of intravenous contrast. Multiplanar CT image reconstructions and MIPs were obtained to evaluate the vascular anatomy. Carotid stenosis measurements (when applicable) are obtained utilizing NASCET criteria, using the distal internal carotid diameter as the denominator. CONTRAST:  63m ISOVUE-370 IOPAMIDOL (ISOVUE-370) INJECTION 76% COMPARISON:  Head CT 221319 FINDINGS: CTA NECK FINDINGS Aortic arch: There is moderate calcific atherosclerosis of the aortic arch. There is no aneurysm, dissection or hemodynamically significant stenosis of the visualized ascending aorta and aortic arch. Conventional 3 vessel aortic branching pattern. The visualized proximal subclavian arteries are widely patent. Right carotid system: The right common carotid origin is widely patent. There is no common carotid or internal carotid artery dissection or aneurysm. Moderate atherosclerotic calcification at the carotid bifurcation without hemodynamically significant stenosis. Left carotid system: The left common carotid origin is widely patent. There is no common carotid or internal carotid artery dissection or aneurysm. Moderate atherosclerotic calcification at the carotid bifurcation without hemodynamically significant stenosis. Vertebral arteries: The vertebral system is right dominant. Both vertebral artery origins are normal. Both vertebral arteries are normal to their confluence with the basilar artery. Skeleton: There is no bony spinal canal stenosis. No lytic or blastic lesions. Other neck: 3 cm right thyroid nodule. Upper chest: No pneumothorax  or pleural effusion. No nodules or masses. Review of the MIP images confirms the above findings CTA HEAD FINDINGS Anterior circulation: --Intracranial internal carotid arteries: Atherosclerotic calcification of both internal carotid arteries at the skull base. --Anterior cerebral arteries: Normal. --Middle cerebral arteries: Normal. --Posterior communicating arteries: Present on the right, absent on the left. Posterior circulation: --Posterior cerebral arteries: Normal. --Superior cerebellar arteries: Normal. --Basilar artery: Normal. --Anterior inferior cerebellar arteries: Normal. --Posterior inferior cerebellar arteries: Normal. Venous sinuses: As permitted by contrast timing, patent. Anatomic variants: Fetal origin of the right PCA. Delayed phase: Not performed. Review of the MIP images confirms the above findings. IMPRESSION: 1. No emergent large vessel occlusion or high-grade stenosis.  The 2. Bilateral carotid bifurcation atherosclerosis without hemodynamically significant stenosis. 3. Aortic Atherosclerosis (ICD10-I70.0).  The 4. 3 cm right thyroid nodule. Dedicated thyroid ultrasound recommended for further characterization on a nonemergent basis. Electronically Signed   By: KUlyses JarredM.D.   On: 01/05/2018 16:53   Ct Cervical Spine Wo Contrast  Result Date: 12/28/2017 CLINICAL DATA:  Pain after fall. EXAM: CT HEAD WITHOUT CONTRAST CT CERVICAL SPINE WITHOUT CONTRAST TECHNIQUE: Multidetector CT imaging of the head and cervical spine was performed following the standard protocol without intravenous contrast. Multiplanar CT image reconstructions of the cervical  spine were also generated. COMPARISON:  Mar 28, 2016 CT of the brain FINDINGS: CT HEAD FINDINGS Brain: No subdural, epidural, or subarachnoid hemorrhage. Ventricles and sulci are stable. White matter changes are unchanged. No acute cortical ischemia or infarct. Cerebellum, brainstem, and basal cisterns are normal. No mass effect midline shift.  Vascular: Calcified atherosclerosis in the intracranial carotids. Skull: Normal. Negative for fracture or focal lesion. Sinuses/Orbits: Chronic opacification of the left mastoid air cells and middle ear. Opacification of several superior right mastoid air cells is chronic as well. Paranasal sinuses and right middle ear are well aerated. Other: None. CT CERVICAL SPINE FINDINGS Alignment: Straightening of normal lordosis.  No other malalignment. Skull base and vertebrae: No acute fracture. No primary bone lesion or focal pathologic process. Soft tissues and spinal canal: Calcified atherosclerosis identified in the carotid arteries. The right thyroid lobe is enlarged and may contain a nodule as it is lower in attenuation than the left thyroid lobe. No other soft tissue abnormalities identified. No prevertebral soft tissue swelling. The cervical canal is grossly patent. Disc levels:  Multilevel degenerative changes. Upper chest: Negative. Other: No other abnormalities. IMPRESSION: 1. No acute intracranial abnormality identified. 2. No fracture or traumatic malalignment in the cervical spine. Degenerative changes. 3. Enlargement of the right thyroid lobe, which may contain a nodule/mass. Ultrasound could further evaluate if clinically warranted. Electronically Signed   By: Dorise Bullion III M.D   On: 12/28/2017 21:14   Dg Chest Port 1 View  Result Date: 01/07/2018 CLINICAL DATA:  Respiratory failure EXAM: PORTABLE CHEST 1 VIEW COMPARISON:  January 05, 2018 FINDINGS: Endotracheal tube and nasogastric tube have been removed. Pacemaker leads are attached to the right atrium and right ventricle. No pneumothorax. There is cardiomegaly with pulmonary venous hypertension. There are small pleural effusions bilaterally. There are areas of patchy atelectasis in both mid and lower lung zones. There is no frank edema or consolidation. There is aortic atherosclerosis. No adenopathy. No bone lesions evident. IMPRESSION:  Endotracheal tube and nasogastric tube removed. No pneumothorax. Pulmonary vascular congestion with small pleural effusions. Scattered areas of atelectatic change bilaterally. No frank edema or consolidation. Electronically Signed   By: Lowella Grip III M.D.   On: 01/07/2018 07:22   Dg Chest Portable 1 View  Result Date: 01/05/2018 CLINICAL DATA:  Endotracheal tube and  gastric tube position EXAM: PORTABLE CHEST 1 VIEW COMPARISON:  11/30/2017 FINDINGS: Endotracheal tube in good position 2.5 cm above the carina. Gastric tube in the stomach. Dual lead pacemaker unchanged. CABG. Mild cardiac enlargement. Negative for heart failure or edema. Mild left lower lobe atelectasis. Negative for pneumonia. Negative for pleural effusion IMPRESSION: Endotracheal tube in good position. Mild left lower lobe atelectasis. Otherwise the lungs are well aerated. Electronically Signed   By: Franchot Gallo M.D.   On: 01/05/2018 16:23   Ct Head Code Stroke Wo Contrast  Result Date: 01/05/2018 CLINICAL DATA:  Code stroke.  Unresponsive.  Right-sided gaze. EXAM: CT HEAD WITHOUT CONTRAST TECHNIQUE: Contiguous axial images were obtained from the base of the skull through the vertex without intravenous contrast. COMPARISON:  CT of the abdomen scratched at CT of the head 12/28/2017 FINDINGS: Brain: Moderate generalized atrophy and diffuse white matter disease is stable. No acute infarct, hemorrhage, or mass lesion is present. The ventricles are proportionate to the degree of atrophy. The sylvian fissure is asymmetric on the left, a chronic finding. Brainstem and cerebellum are normal. Vascular: Vascular calcifications are present within the cavernous internal carotid arteries bilaterally.  There is no hyperdense vessel. Skull: Calvarium is intact. No focal lytic or blastic lesions are present. Sinuses/Orbits: The paranasal sinuses are clear. Bilateral lens replacements are present. Globes and orbits are within normal limits. ASPECTS  [facility] Stroke Program Early CT Score) - Ganglionic level infarction (caudate, lentiform nuclei, internal capsule, insula, M1-M3 cortex): 7/7 - Supraganglionic infarction (M4-M6 cortex): 3/3 Total score (0-10 with 10 being normal): 10/10 IMPRESSION: 1. Stable atrophy and white matter disease. No acute intracranial abnormality. 2. ASPECTS is 10/10 The above was relayed via text pager to Dr. Kerney Elbe on 01/05/2018 at 16:03 . Electronically Signed   By: San Morelle M.D.   On: 01/05/2018 16:03    Time Spent in minutes  30   Jani Gravel M.D on 01/09/2018 at 11:02 AM  Between 7am to 7pm - Pager - [phone]  After 7pm go to [email or web address] - password [facility]  [facility] -  Office  [phone]

## 2018-01-10 ENCOUNTER — Telehealth: Payer: Self-pay | Admitting: Internal Medicine

## 2018-01-10 ENCOUNTER — Ambulatory Visit: Payer: Medicare Other | Admitting: Endocrinology

## 2018-01-10 ENCOUNTER — Ambulatory Visit: Payer: Medicare Other | Admitting: Internal Medicine

## 2018-01-10 ENCOUNTER — Telehealth: Payer: Self-pay | Admitting: Neurology

## 2018-01-10 DIAGNOSIS — R410 Disorientation, unspecified: Secondary | ICD-10-CM

## 2018-01-10 DIAGNOSIS — F0390 Unspecified dementia without behavioral disturbance: Secondary | ICD-10-CM

## 2018-01-10 DIAGNOSIS — I482 Chronic atrial fibrillation, unspecified: Secondary | ICD-10-CM

## 2018-01-11 ENCOUNTER — Other Ambulatory Visit: Payer: Self-pay | Admitting: Internal Medicine

## 2018-01-11 ENCOUNTER — Telehealth: Payer: Self-pay | Admitting: *Deleted

## 2018-01-11 NOTE — Telephone Encounter (Signed)
Pt was on TCM report admitted 01/05/18 to ICU and was intubated for respiratory protection. Once family arrived, they were in agreement for DNR. Palliative care was consulted. Family decided for comfort care and hospice. D/C patient 28-Jan-2018 to hospice and was extubated, started on morphine gtt for comfort.Marland KitchenJohny Chess.

## 2018-01-17 ENCOUNTER — Ambulatory Visit: Payer: Medicare Other | Admitting: Endocrinology

## 2018-01-21 NOTE — Telephone Encounter (Signed)
New message  Pt daughter verbalized that she is calling for the RN  Pt Is not in hospice care

## 2018-01-21 NOTE — Progress Notes (Signed)
Chaplain came to Patient's room by way of Spiritual Care consult and rounding. The Patient is nearing the End of Life. Present in the room at the time of visit were the patient's daughter, daughter's husband and two grandchildren all adults over the age of 81.  Family shared stories and fond recollections of [name] "Grandma Patty." Faith is central to their lives. Daughter expressed that one of the last things she heard her mother say after extubation was "God said love everybody." They are in a good place of acceptance as they have connected this time when death is near to how Ms. Sgroi would have liked to die. Chaplain offered empathic listening, Ministry of Presence, Words of Comfort, Scripture and Prayers.

## 2018-01-21 NOTE — Telephone Encounter (Signed)
Noted.  I have been following her hospital course and aware that she is going to inpatient hospice

## 2018-01-21 NOTE — Consult Note (Signed)
[facility]  Met with pt's family daughter Lenard Forth and DIL and grandson. Discussed  Hospice care/ hospice home service agreement. Family in agreement with pt transferring to the [facility] for end of life care. Discussed with our medical director and she was approved. Offered the bed and family accepted. [facility] called to pick pt up at 100pm. MD made aware and in agreement as well. Webb Silversmith RN   [phone]

## 2018-01-21 NOTE — Telephone Encounter (Signed)
Spoke with daughter, [name], and she said that patient has been referred to Hospice and she would like to cancel all of her appointments. Daughter advised that her doctor would be informed and all appointment will be canceled per her request.

## 2018-01-21 NOTE — Telephone Encounter (Signed)
Patient's daughter calling to advise patient is now in Hospice care.

## 2018-01-21 NOTE — Social Work (Addendum)
CSW aware that [facility] ([place]) is pt family first choice for hospice placement. [facility] liaison is aware, and going to meet with pt daughter at bedside.  11:25am- Clinical Social Worker facilitated patient discharge including contacting patient family and facility to confirm patient discharge plans.  Clinical information faxed to facility and family agreeable with plan.  CSW arranged ambulance transport via [facility] to [facility]).   RN to call [phone] with report prior to discharge.  Clinical Social Worker will sign off for now as social work intervention is no longer needed. Please consult [place] again if new need arises.  Alexander Mt, LCSWA Clinical Social Worker [phone]

## 2018-01-21 NOTE — Progress Notes (Signed)
Report called to Butch Penny at [facility] hospice.

## 2018-01-21 NOTE — Discharge Summary (Signed)
Physician Discharge Summary  MALEIGHA COLVARD Gonzales:[id number] DOB: 26-Dec-1924 DOA: 01/05/2018  PCP: Binnie Rail, MD  Admit date: 01/05/2018 Discharge date: 2018-01-20  Admitted From: Home  Disposition:  Hospice   Discharge Condition: Terminal CODE STATUS: DNR  Diet recommendation: Comfort   Brief/Interim Summary: [name] is a 84 year old with dementia with a significant component of hallucinations since December. She was found unresponsive today with her face flat on the table. There was blood from the corner of her mouth but she had no witnessed tonic-clonic activity and no loss of bowel control. He has no prior history of seizure activity. She is a diabetic but she had eaten breakfast and her blood sugar after breakfast was 148. According to family she does not have difficulties with intermittent hypoglycemia. She has a significant cardiac history and has a pacemaker in place but family is not aware that she never had tachyarrhythmias or syncopal episodes related to arrhythmias. She was brought to the department of emergency medicine where she was intubated for airway protection. A CT scan of the head showed no acute changes and a CTA did not show any large vessel occlusions. She is not responsible for sorting or taking her own medications and takes no medications without supervision. There is no possibility of confusion with medicines of other people in the household. She was admitted to ICU and was intubated for respiratory protection. Once family arrived, they were in agreement for DNR. Palliative care was consulted. Family decided for comfort care and hospice. Patient was extubated, started on morphine gtt for comfort.   Discharge Diagnoses:  Principal Problem:   Altered mental status Active Problems:   Hypothyroidism   Hyperlipidemia   HYPERTENSION, BENIGN   SICK SINUS/ TACHY-BRADY SYNDROME   GERD   PACEMAKER, PERMANENT   Chronic systolic heart failure (HCC)   Diabetes  (HCC)   Goals of care, counseling/discussion   CKD (chronic kidney disease) stage 3, GFR 30-59 ml/min (HCC)   Altered consciousness   Acute respiratory failure (HCC)   Palliative care by specialist   Dementia   Delirium   Atrial fibrillation, chronic ([place])   Discharge Instructions   Allergies as of 01/20/2018      Reactions   Other Other (See Comments)   Patient cannot have a MRI because she has a pacemaker   Codeine Rash   Robaxin [methocarbamol] Other (See Comments)   Too strong and made the patient "loopy"      Medication List    STOP taking these medications   acarbose 50 MG tablet Commonly known as:  PRECOSE   acetaminophen 500 MG tablet Commonly known as:  TYLENOL   ANTI-DIARRHEAL PO   apixaban 2.5 MG Tabs tablet Commonly known as:  ELIQUIS   atorvastatin 20 MG tablet Commonly known as:  LIPITOR   bromocriptine 2.5 MG tablet Commonly known as:  PARLODEL   cephALEXin 500 MG capsule Commonly known as:  KEFLEX   cetirizine 10 MG tablet Commonly known as:  ZYRTEC   colesevelam 625 MG tablet Commonly known as:  WELCHOL   diltiazem 30 MG tablet Commonly known as:  CARDIZEM   enalapril 20 MG tablet Commonly known as:  VASOTEC   furosemide 20 MG tablet Commonly known as:  LASIX   INVOKANA 100 MG Tabs tablet Generic drug:  canagliflozin   levothyroxine 50 MCG tablet Commonly known as:  SYNTHROID, LEVOTHROID   loratadine 10 MG tablet Commonly known as:  CLARITIN   meclizine 25 MG tablet Commonly  known as:  ANTIVERT   Memantine HCl-Donepezil HCl 28-10 MG Cp24 Commonly known as:  NAMZARIC   metoprolol 200 MG 24 hr tablet Commonly known as:  TOPROL-XL   mirabegron ER 25 MG Tb24 tablet Commonly known as:  MYRBETRIQ   neomycin-bacitracin-polymyxin ointment Commonly known as:  NEOSPORIN   niacin 500 MG tablet   nitroGLYCERIN 0.3 MG SL tablet Commonly known as:  NITROSTAT   nystatin powder Commonly known as:  MYCOSTATIN/NYSTOP    pantoprazole 40 MG tablet Commonly known as:  PROTONIX   repaglinide 0.5 MG tablet Commonly known as:  PRANDIN   sertraline 50 MG tablet Commonly known as:  ZOLOFT   sitaGLIPtin 100 MG tablet Commonly known as:  JANUVIA       Allergies  Allergen Reactions  . Other Other (See Comments)    Patient cannot have a MRI because she has a pacemaker  . Codeine Rash  . Robaxin [Methocarbamol] Other (See Comments)    Too strong and made the patient "loopy"    Consultations:  PCCM  Neurology  Palliative Care    Procedures/Studies: Ct Angio Head W Or Wo Contrast  Result Date: 01/05/2018 CLINICAL DATA:  Right-sided gaze and unresponsive. EXAM: CT ANGIOGRAPHY HEAD AND NECK TECHNIQUE: Multidetector CT imaging of the head and neck was performed using the standard protocol during bolus administration of intravenous contrast. Multiplanar CT image reconstructions and MIPs were obtained to evaluate the vascular anatomy. Carotid stenosis measurements (when applicable) are obtained utilizing NASCET criteria, using the distal internal carotid diameter as the denominator. CONTRAST:  57mL ISOVUE-370 IOPAMIDOL (ISOVUE-370) INJECTION 76% COMPARISON:  Head CT 221319 FINDINGS: CTA NECK FINDINGS Aortic arch: There is moderate calcific atherosclerosis of the aortic arch. There is no aneurysm, dissection or hemodynamically significant stenosis of the visualized ascending aorta and aortic arch. Conventional 3 vessel aortic branching pattern. The visualized proximal subclavian arteries are widely patent. Right carotid system: The right common carotid origin is widely patent. There is no common carotid or internal carotid artery dissection or aneurysm. Moderate atherosclerotic calcification at the carotid bifurcation without hemodynamically significant stenosis. Left carotid system: The left common carotid origin is widely patent. There is no common carotid or internal carotid artery dissection or aneurysm.  Moderate atherosclerotic calcification at the carotid bifurcation without hemodynamically significant stenosis. Vertebral arteries: The vertebral system is right dominant. Both vertebral artery origins are normal. Both vertebral arteries are normal to their confluence with the basilar artery. Skeleton: There is no bony spinal canal stenosis. No lytic or blastic lesions. Other neck: 3 cm right thyroid nodule. Upper chest: No pneumothorax or pleural effusion. No nodules or masses. Review of the MIP images confirms the above findings CTA HEAD FINDINGS Anterior circulation: --Intracranial internal carotid arteries: Atherosclerotic calcification of both internal carotid arteries at the skull base. --Anterior cerebral arteries: Normal. --Middle cerebral arteries: Normal. --Posterior communicating arteries: Present on the right, absent on the left. Posterior circulation: --Posterior cerebral arteries: Normal. --Superior cerebellar arteries: Normal. --Basilar artery: Normal. --Anterior inferior cerebellar arteries: Normal. --Posterior inferior cerebellar arteries: Normal. Venous sinuses: As permitted by contrast timing, patent. Anatomic variants: Fetal origin of the right PCA. Delayed phase: Not performed. Review of the MIP images confirms the above findings. IMPRESSION: 1. No emergent large vessel occlusion or high-grade stenosis.  The 2. Bilateral carotid bifurcation atherosclerosis without hemodynamically significant stenosis. 3. Aortic Atherosclerosis (ICD10-I70.0).  The 4. 3 cm right thyroid nodule. Dedicated thyroid ultrasound recommended for further characterization on a nonemergent basis. Electronically Signed   By:  Ulyses Jarred M.D.   On: 01/05/2018 16:53   Dg Thoracic Spine 2 View  Result Date: 12/28/2017 CLINICAL DATA:  Pain after a fall EXAM: THORACIC SPINE 2 VIEWS COMPARISON:  Radiograph 11/30/2017 FINDINGS: Post sternotomy changes and cardiac pacing leads. Surgical clips in the right upper quadrant.  Thoracic alignment is within normal limits. Diffuse degenerative changes with osteophytes. Vertebral body heights are maintained. IMPRESSION: No definite acute osseous abnormality. Electronically Signed   By: Donavan Foil M.D.   On: 12/28/2017 21:14   Dg Lumbar Spine Complete  Result Date: 12/28/2017 CLINICAL DATA:  Pain after fall EXAM: LUMBAR SPINE - COMPLETE 4+ VIEW COMPARISON:  None. FINDINGS: SI joints are symmetric. Surgical clips in the right upper quadrant. Aortic atherosclerosis. Lumbar alignment within normal limits. Vertebral body heights are normal. Mild degenerative disc changes at L1-L2 and L2-L3 with moderate degenerative changes at L4-L5 and L5-S1. Posterior facet arthropathy L3 through S1. IMPRESSION: 1. Multilevel degenerative changes.  No acute osseous abnormality. Electronically Signed   By: Donavan Foil M.D.   On: 12/28/2017 21:17   Ct Head Wo Contrast  Result Date: 12/28/2017 CLINICAL DATA:  Pain after fall. EXAM: CT HEAD WITHOUT CONTRAST CT CERVICAL SPINE WITHOUT CONTRAST TECHNIQUE: Multidetector CT imaging of the head and cervical spine was performed following the standard protocol without intravenous contrast. Multiplanar CT image reconstructions of the cervical spine were also generated. COMPARISON:  Mar 28, 2016 CT of the brain FINDINGS: CT HEAD FINDINGS Brain: No subdural, epidural, or subarachnoid hemorrhage. Ventricles and sulci are stable. White matter changes are unchanged. No acute cortical ischemia or infarct. Cerebellum, brainstem, and basal cisterns are normal. No mass effect midline shift. Vascular: Calcified atherosclerosis in the intracranial carotids. Skull: Normal. Negative for fracture or focal lesion. Sinuses/Orbits: Chronic opacification of the left mastoid air cells and middle ear. Opacification of several superior right mastoid air cells is chronic as well. Paranasal sinuses and right middle ear are well aerated. Other: None. CT CERVICAL SPINE FINDINGS Alignment:  Straightening of normal lordosis.  No other malalignment. Skull base and vertebrae: No acute fracture. No primary bone lesion or focal pathologic process. Soft tissues and spinal canal: Calcified atherosclerosis identified in the carotid arteries. The right thyroid lobe is enlarged and may contain a nodule as it is lower in attenuation than the left thyroid lobe. No other soft tissue abnormalities identified. No prevertebral soft tissue swelling. The cervical canal is grossly patent. Disc levels:  Multilevel degenerative changes. Upper chest: Negative. Other: No other abnormalities. IMPRESSION: 1. No acute intracranial abnormality identified. 2. No fracture or traumatic malalignment in the cervical spine. Degenerative changes. 3. Enlargement of the right thyroid lobe, which may contain a nodule/mass. Ultrasound could further evaluate if clinically warranted. Electronically Signed   By: Dorise Bullion III M.D   On: 12/28/2017 21:14   Ct Angio Neck W Or Wo Contrast  Result Date: 01/05/2018 CLINICAL DATA:  Right-sided gaze and unresponsive. EXAM: CT ANGIOGRAPHY HEAD AND NECK TECHNIQUE: Multidetector CT imaging of the head and neck was performed using the standard protocol during bolus administration of intravenous contrast. Multiplanar CT image reconstructions and MIPs were obtained to evaluate the vascular anatomy. Carotid stenosis measurements (when applicable) are obtained utilizing NASCET criteria, using the distal internal carotid diameter as the denominator. CONTRAST:  61mL ISOVUE-370 IOPAMIDOL (ISOVUE-370) INJECTION 76% COMPARISON:  Head CT 221319 FINDINGS: CTA NECK FINDINGS Aortic arch: There is moderate calcific atherosclerosis of the aortic arch. There is no aneurysm, dissection or hemodynamically significant stenosis  of the visualized ascending aorta and aortic arch. Conventional 3 vessel aortic branching pattern. The visualized proximal subclavian arteries are widely patent. Right carotid system: The  right common carotid origin is widely patent. There is no common carotid or internal carotid artery dissection or aneurysm. Moderate atherosclerotic calcification at the carotid bifurcation without hemodynamically significant stenosis. Left carotid system: The left common carotid origin is widely patent. There is no common carotid or internal carotid artery dissection or aneurysm. Moderate atherosclerotic calcification at the carotid bifurcation without hemodynamically significant stenosis. Vertebral arteries: The vertebral system is right dominant. Both vertebral artery origins are normal. Both vertebral arteries are normal to their confluence with the basilar artery. Skeleton: There is no bony spinal canal stenosis. No lytic or blastic lesions. Other neck: 3 cm right thyroid nodule. Upper chest: No pneumothorax or pleural effusion. No nodules or masses. Review of the MIP images confirms the above findings CTA HEAD FINDINGS Anterior circulation: --Intracranial internal carotid arteries: Atherosclerotic calcification of both internal carotid arteries at the skull base. --Anterior cerebral arteries: Normal. --Middle cerebral arteries: Normal. --Posterior communicating arteries: Present on the right, absent on the left. Posterior circulation: --Posterior cerebral arteries: Normal. --Superior cerebellar arteries: Normal. --Basilar artery: Normal. --Anterior inferior cerebellar arteries: Normal. --Posterior inferior cerebellar arteries: Normal. Venous sinuses: As permitted by contrast timing, patent. Anatomic variants: Fetal origin of the right PCA. Delayed phase: Not performed. Review of the MIP images confirms the above findings. IMPRESSION: 1. No emergent large vessel occlusion or high-grade stenosis.  The 2. Bilateral carotid bifurcation atherosclerosis without hemodynamically significant stenosis. 3. Aortic Atherosclerosis (ICD10-I70.0).  The 4. 3 cm right thyroid nodule. Dedicated thyroid ultrasound recommended  for further characterization on a nonemergent basis. Electronically Signed   By: Ulyses Jarred M.D.   On: 01/05/2018 16:53   Ct Cervical Spine Wo Contrast  Result Date: 12/28/2017 CLINICAL DATA:  Pain after fall. EXAM: CT HEAD WITHOUT CONTRAST CT CERVICAL SPINE WITHOUT CONTRAST TECHNIQUE: Multidetector CT imaging of the head and cervical spine was performed following the standard protocol without intravenous contrast. Multiplanar CT image reconstructions of the cervical spine were also generated. COMPARISON:  Mar 28, 2016 CT of the brain FINDINGS: CT HEAD FINDINGS Brain: No subdural, epidural, or subarachnoid hemorrhage. Ventricles and sulci are stable. White matter changes are unchanged. No acute cortical ischemia or infarct. Cerebellum, brainstem, and basal cisterns are normal. No mass effect midline shift. Vascular: Calcified atherosclerosis in the intracranial carotids. Skull: Normal. Negative for fracture or focal lesion. Sinuses/Orbits: Chronic opacification of the left mastoid air cells and middle ear. Opacification of several superior right mastoid air cells is chronic as well. Paranasal sinuses and right middle ear are well aerated. Other: None. CT CERVICAL SPINE FINDINGS Alignment: Straightening of normal lordosis.  No other malalignment. Skull base and vertebrae: No acute fracture. No primary bone lesion or focal pathologic process. Soft tissues and spinal canal: Calcified atherosclerosis identified in the carotid arteries. The right thyroid lobe is enlarged and may contain a nodule as it is lower in attenuation than the left thyroid lobe. No other soft tissue abnormalities identified. No prevertebral soft tissue swelling. The cervical canal is grossly patent. Disc levels:  Multilevel degenerative changes. Upper chest: Negative. Other: No other abnormalities. IMPRESSION: 1. No acute intracranial abnormality identified. 2. No fracture or traumatic malalignment in the cervical spine. Degenerative changes.  3. Enlargement of the right thyroid lobe, which may contain a nodule/mass. Ultrasound could further evaluate if clinically warranted. Electronically Signed   By: Shanon Brow  Mee Hives M.D   On: 12/28/2017 21:14   Dg Chest Port 1 View  Result Date: 01/07/2018 CLINICAL DATA:  Respiratory failure EXAM: PORTABLE CHEST 1 VIEW COMPARISON:  January 05, 2018 FINDINGS: Endotracheal tube and nasogastric tube have been removed. Pacemaker leads are attached to the right atrium and right ventricle. No pneumothorax. There is cardiomegaly with pulmonary venous hypertension. There are small pleural effusions bilaterally. There are areas of patchy atelectasis in both mid and lower lung zones. There is no frank edema or consolidation. There is aortic atherosclerosis. No adenopathy. No bone lesions evident. IMPRESSION: Endotracheal tube and nasogastric tube removed. No pneumothorax. Pulmonary vascular congestion with small pleural effusions. Scattered areas of atelectatic change bilaterally. No frank edema or consolidation. Electronically Signed   By: Lowella Grip III M.D.   On: 01/07/2018 07:22   Dg Chest Portable 1 View  Result Date: 01/05/2018 CLINICAL DATA:  Endotracheal tube and  gastric tube position EXAM: PORTABLE CHEST 1 VIEW COMPARISON:  11/30/2017 FINDINGS: Endotracheal tube in good position 2.5 cm above the carina. Gastric tube in the stomach. Dual lead pacemaker unchanged. CABG. Mild cardiac enlargement. Negative for heart failure or edema. Mild left lower lobe atelectasis. Negative for pneumonia. Negative for pleural effusion IMPRESSION: Endotracheal tube in good position. Mild left lower lobe atelectasis. Otherwise the lungs are well aerated. Electronically Signed   By: Franchot Gallo M.D.   On: 01/05/2018 16:23   Ct Head Code Stroke Wo Contrast  Result Date: 01/05/2018 CLINICAL DATA:  Code stroke.  Unresponsive.  Right-sided gaze. EXAM: CT HEAD WITHOUT CONTRAST TECHNIQUE: Contiguous axial images were  obtained from the base of the skull through the vertex without intravenous contrast. COMPARISON:  CT of the abdomen scratched at CT of the head 12/28/2017 FINDINGS: Brain: Moderate generalized atrophy and diffuse white matter disease is stable. No acute infarct, hemorrhage, or mass lesion is present. The ventricles are proportionate to the degree of atrophy. The sylvian fissure is asymmetric on the left, a chronic finding. Brainstem and cerebellum are normal. Vascular: Vascular calcifications are present within the cavernous internal carotid arteries bilaterally. There is no hyperdense vessel. Skull: Calvarium is intact. No focal lytic or blastic lesions are present. Sinuses/Orbits: The paranasal sinuses are clear. Bilateral lens replacements are present. Globes and orbits are within normal limits. ASPECTS [facility] Stroke Program Early CT Score) - Ganglionic level infarction (caudate, lentiform nuclei, internal capsule, insula, M1-M3 cortex): 7/7 - Supraganglionic infarction (M4-M6 cortex): 3/3 Total score (0-10 with 10 being normal): 10/10 IMPRESSION: 1. Stable atrophy and white matter disease. No acute intracranial abnormality. 2. ASPECTS is 10/10 The above was relayed via text pager to Dr. Kerney Elbe on 01/05/2018 at 16:03 . Electronically Signed   By: San Morelle M.D.   On: 01/05/2018 16:03    EEG Impression:  This is an abnormal EEG due to background slowing and disorganization seen throughout the tracing.  This is a non-specific finding that can be seen with toxic, metabolic, diffuse, or multifocal structural processes.  No definite epileptiform changes were noted.   A single EEG without epileptiform changes does not exclude the diagnosis of epilepsy. Clinical correlation advised.    Discharge Exam: Vitals:   01/09/18 0431 01-20-2018 0429  BP: 132/66 128/88  Pulse: 87 (!) 115  Resp: 19 19  Temp: (!) 97.4 F (36.3 C) 98.3 F (36.8 C)  SpO2: (!) 86% (!) 80%   Vitals:   01/07/18 1815  01/08/18 0427 01/09/18 0431 01/20/2018 0429  BP: 126/65 122/60 132/66  128/88  Pulse: 68 61 87 (!) 115  Resp:  19 19 19   Temp:  98.2 F (36.8 C) (!) 97.4 F (36.3 C) 98.3 F (36.8 C)  TempSrc:  Axillary Axillary Oral  SpO2: (!) 86% 92% (!) 86% (!) 80%  Weight:      Height:        General: Pt is sleeping, somnolent, comfortable appearing     The results of significant diagnostics from this hospitalization (including imaging, microbiology, ancillary and laboratory) are listed below for reference.     Microbiology: Recent Results (from the past 240 hour(s))  MRSA PCR Screening     Status: None   Collection Time: 01/05/18 10:28 PM  Result Value Ref Range Status   MRSA by PCR NEGATIVE NEGATIVE Final    Comment:        The GeneXpert MRSA Assay (FDA approved for NASAL specimens only), is one component of a comprehensive MRSA colonization surveillance program. It is not intended to diagnose MRSA infection nor to guide or monitor treatment for MRSA infections. Performed at [facility], [place] [address], [place], [place] [address]      Labs: BNP (last 3 results) Recent Labs    11/30/17 1321  BNP 301.6*   Basic Metabolic Panel: Recent Labs  Lab 01/05/18 1547 01/05/18 1551 01/06/18 0315 01/07/18 0502  NA 140 140 141 143  K 5.0 4.9 4.7 4.0  CL 105 104 106 107  CO2 22  --  23 24  GLUCOSE 153* 148* 139* 153*  BUN 28* 38* 27* 21*  CREATININE 1.59* 1.70* 1.44* 1.52*  CALCIUM 9.5  --  9.0 8.9  MG  --   --   --  1.7  PHOS  --   --   --  4.5   Liver Function Tests: Recent Labs  Lab 01/05/18 1547 01/06/18 0945  AST 39 63*  ALT 27 63*  ALKPHOS 45 59  BILITOT 1.3* 1.0  PROT 7.0 6.5  ALBUMIN 3.8 3.6   No results for input(s): LIPASE, AMYLASE in the last 168 hours. Recent Labs  Lab 01/06/18 0945  AMMONIA 42*   CBC: Recent Labs  Lab 01/05/18 1547 01/05/18 1551 01/06/18 0315 01/07/18 0502  WBC 6.3  --  9.2 8.5  NEUTROABS 3.5  --   --   --   HGB 12.2  12.9 12.7 11.3*  HCT 39.4 38.0 40.4 36.8  MCV 91.6  --  91.0 91.5  PLT 161  --  145* 133*   Cardiac Enzymes: No results for input(s): CKTOTAL, CKMB, CKMBINDEX, TROPONINI in the last 168 hours. BNP: Invalid input(s): POCBNP CBG: Recent Labs  Lab 01/06/18 0858 01/07/18 0348  GLUCAP 118* 136*   D-Dimer No results for input(s): DDIMER in the last 72 hours. Hgb A1c No results for input(s): HGBA1C in the last 72 hours. Lipid Profile No results for input(s): CHOL, HDL, LDLCALC, TRIG, CHOLHDL, LDLDIRECT in the last 72 hours. Thyroid function studies No results for input(s): TSH, T4TOTAL, T3FREE, THYROIDAB in the last 72 hours.  Invalid input(s): FREET3 Anemia work up No results for input(s): VITAMINB12, FOLATE, FERRITIN, TIBC, IRON, RETICCTPCT in the last 72 hours. Urinalysis    Component Value Date/Time   COLORURINE YELLOW 01/05/2018 1802   APPEARANCEUR CLEAR 01/05/2018 1802   LABSPEC 1.022 01/05/2018 1802   PHURINE 6.0 01/05/2018 1802   GLUCOSEU >=500 (A) 01/05/2018 1802   HGBUR SMALL (A) 01/05/2018 1802   HGBUR negative 12/25/2010 [place] 01/05/2018  [place] 04/03/2013 1516   KETONESUR NEGATIVE 01/05/2018 1802   PROTEINUR NEGATIVE 01/05/2018 1802   UROBILINOGEN 0.2 09/09/2015 1555   NITRITE NEGATIVE 01/05/2018 1802   LEUKOCYTESUR NEGATIVE 01/05/2018 1802   Sepsis Labs Invalid input(s): PROCALCITONIN,  WBC,  LACTICIDVEN Microbiology Recent Results (from the past 240 hour(s))  MRSA PCR Screening     Status: None   Collection Time: 01/05/18 10:28 PM  Result Value Ref Range Status   MRSA by PCR NEGATIVE NEGATIVE Final    Comment:        The GeneXpert MRSA Assay (FDA approved for NASAL specimens only), is one component of a comprehensive MRSA colonization surveillance program. It is not intended to diagnose MRSA infection nor to guide or monitor treatment for MRSA infections. Performed at [facility], [place] [address]  [place], [place] [address]      Time coordinating discharge: 30 minutes  SIGNED:  Dessa Phi, DO [facility] Pager [phone]  If 7PM-7AM, please contact night-coverage [email or web address] Password [facility] 2018-01-29, 11:28 AM

## 2018-01-21 NOTE — Telephone Encounter (Signed)
Copied from [place]. Topic: Quick Communication - See Telephone Encounter >> 02-03-18  2:38 PM Robina Ade, Helene Kelp D wrote: CRM for notification. See Telephone encounter for: 2018/02/03. Patient daughter [name] called to let Dr. Quay Burow that pt has been taken to Hospice and if she had any questions to call her [phone], thanks.

## 2018-01-21 NOTE — Progress Notes (Signed)
Nutrition Brief Note  Chart reviewed. Pt now transitioning to comfort care.  No further nutrition interventions warranted at this time.  Please re-consult as needed.   Baptiste Littler A. Daenerys Buttram, RD, LDN, CDE Pager: [phone] After hours Pager: [phone]  

## 2018-01-21 DEATH — deceased

## 2018-02-18 ENCOUNTER — Other Ambulatory Visit: Payer: Self-pay

## 2018-02-21 ENCOUNTER — Telehealth: Payer: Self-pay | Admitting: Endocrinology

## 2018-02-21 ENCOUNTER — Other Ambulatory Visit: Payer: Self-pay

## 2018-02-21 NOTE — Telephone Encounter (Signed)
I have been advised this pt is deceased.

## 2018-02-21 NOTE — Telephone Encounter (Signed)
[facility] is calling In regards to a refill request sent for Acarbose 50 mg. I didn't see anything in patients medication list.    Please advise [facility]  [phone]

## 2018-02-21 NOTE — Telephone Encounter (Signed)
I see no current meds for this patient? Please advise on if refill is appropriate?

## 2018-02-22 NOTE — Telephone Encounter (Signed)
I called and informed pharmacy that patient was deceased. They stated that this had already been noted there as well, so we are unsure what the call was for.

## 2018-02-24 ENCOUNTER — Encounter: Payer: Medicare Other | Admitting: *Deleted

## 2018-02-24 ENCOUNTER — Telehealth: Payer: Self-pay | Admitting: Cardiology

## 2018-02-24 NOTE — Telephone Encounter (Signed)
Confirmed remote transmission w/ pt daughter. She informed me that pt passed away on 01/23/2018.

## 2018-03-04 ENCOUNTER — Other Ambulatory Visit: Payer: Self-pay

## 2018-03-08 ENCOUNTER — Ambulatory Visit: Payer: Medicare Other | Admitting: Internal Medicine

## 2018-10-26 ENCOUNTER — Ambulatory Visit: Payer: Medicare Other | Admitting: Nurse Practitioner

## 2018-11-08 IMAGING — DX DG CHEST 1V PORT
1 series · 1 of 1 positions shown · non-contrast
Comparison: 11/30/2017

CLINICAL DATA: Endotracheal tube and  gastric tube position

EXAM:
PORTABLE CHEST 1 VIEW

[chest ap]
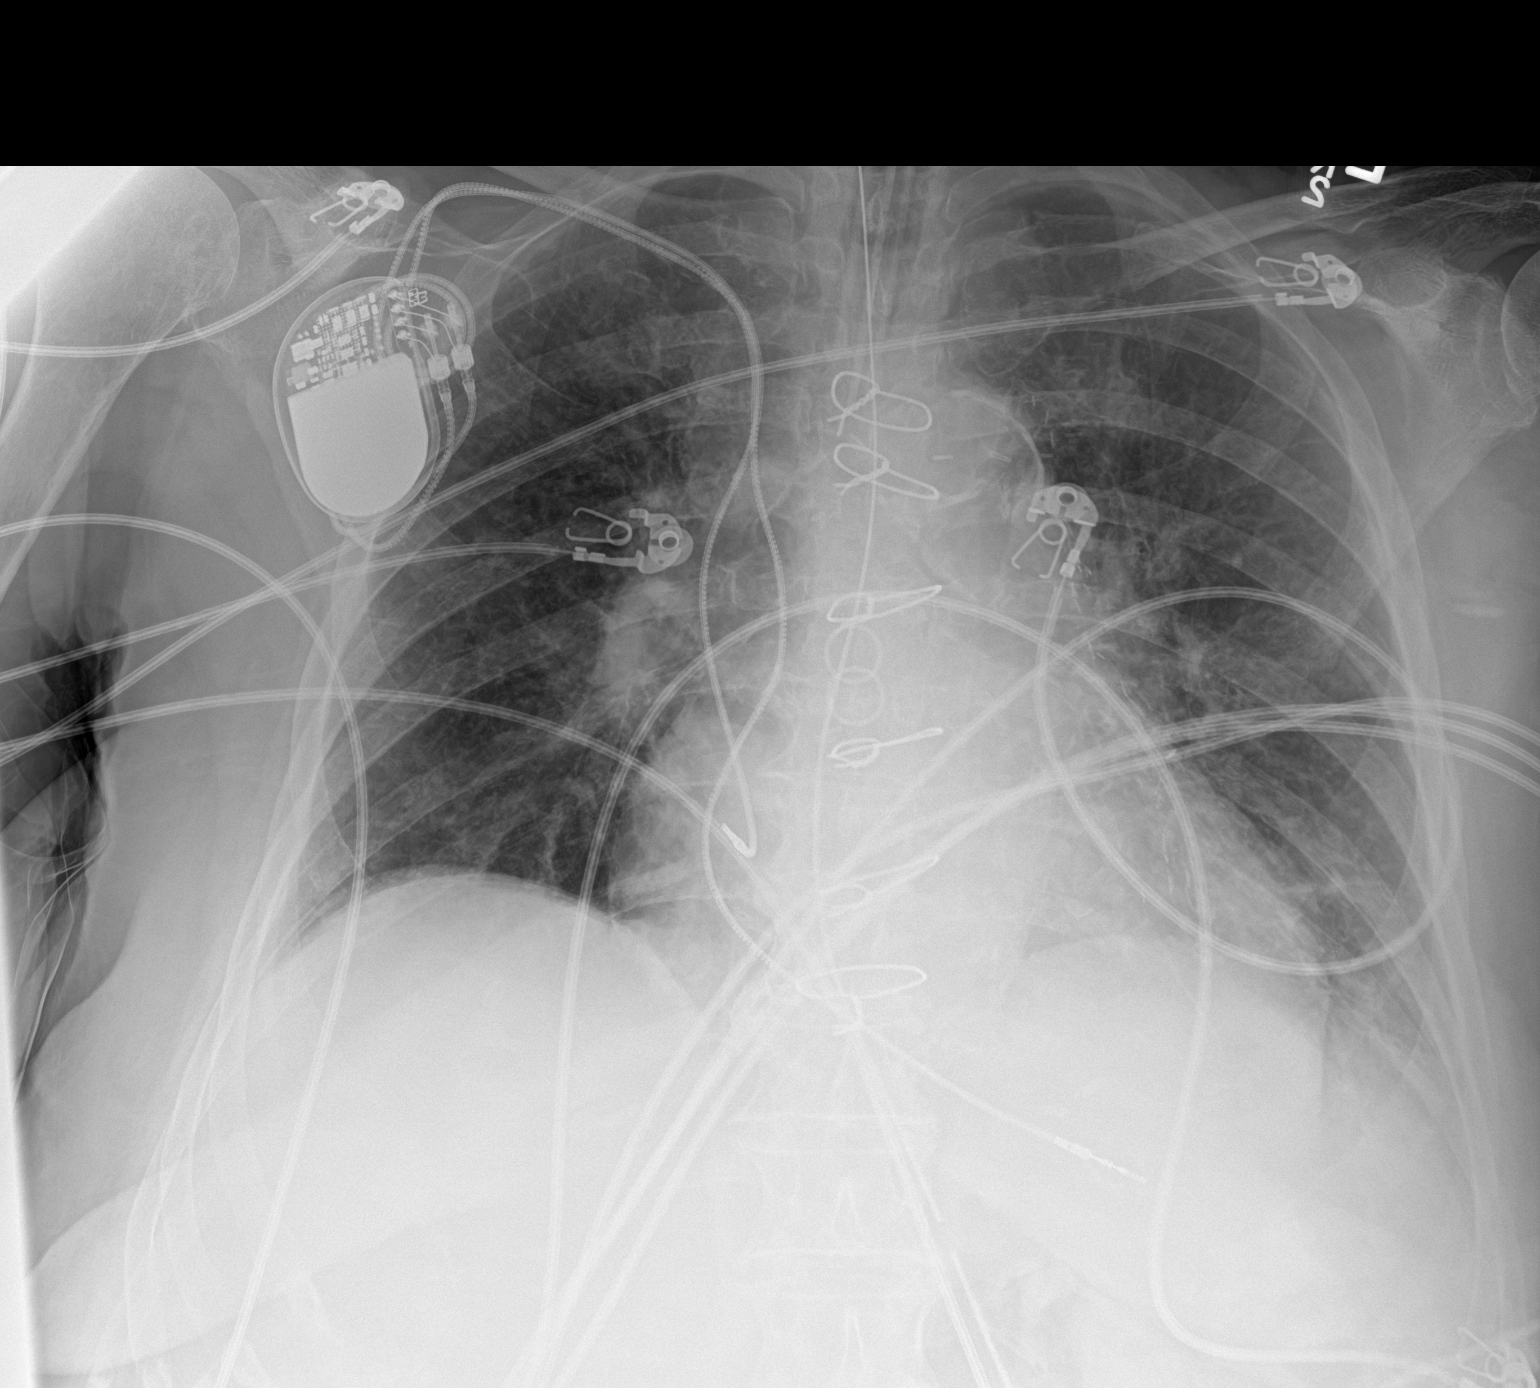

[1 of 1 positions shown; findings below may reference images not displayed]

FINDINGS: Endotracheal tube in good position 2.5 cm above the carina. Gastric
tube in the stomach.

Dual lead pacemaker unchanged. CABG. Mild cardiac enlargement.
Negative for heart failure or edema. Mild left lower lobe
atelectasis. Negative for pneumonia. Negative for pleural effusion
IMPRESSION: Endotracheal tube in good position.

Mild left lower lobe atelectasis. Otherwise the lungs are well
aerated.

## 2018-11-10 IMAGING — DX DG CHEST 1V PORT
1 series · 1 of 1 positions shown · non-contrast
Comparison: January 05, 2018

CLINICAL DATA: Respiratory failure

EXAM:
PORTABLE CHEST 1 VIEW

[chest ap]
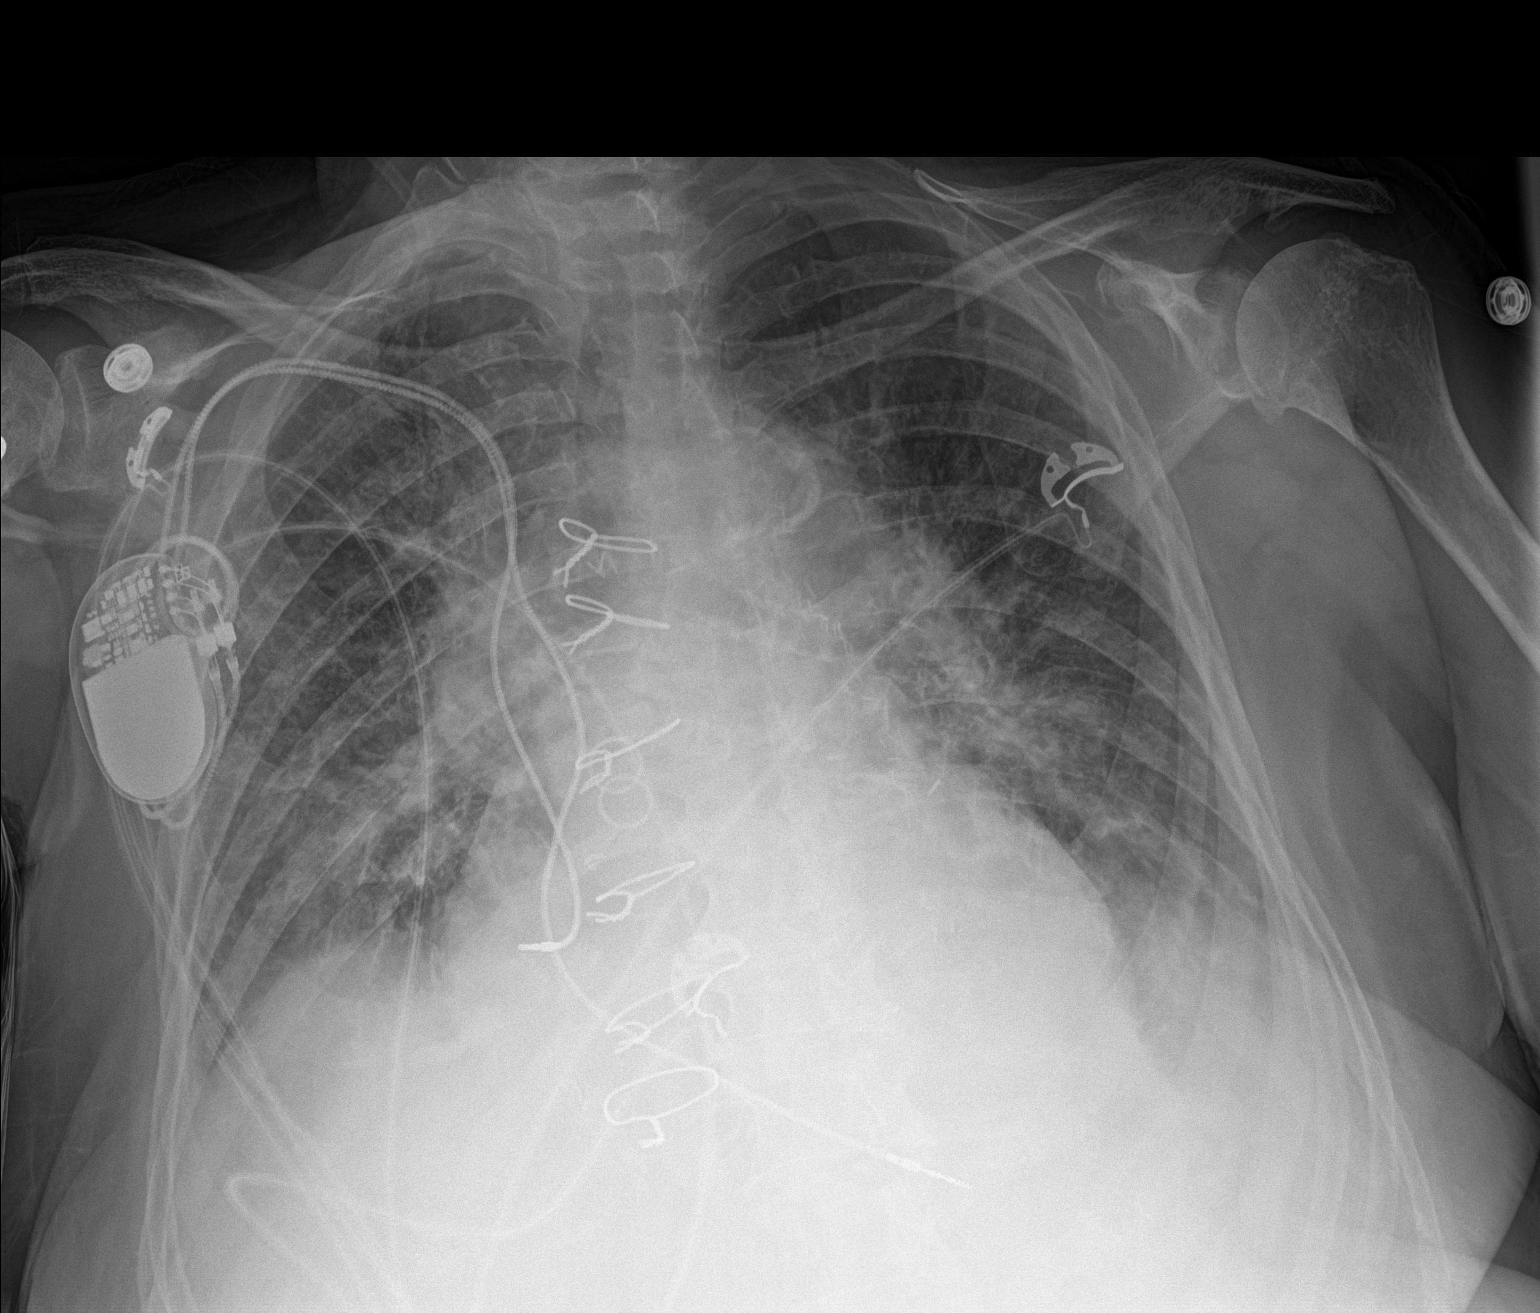

[1 of 1 positions shown; findings below may reference images not displayed]

FINDINGS: Endotracheal tube and nasogastric tube have been removed. Pacemaker
leads are attached to the right atrium and right ventricle. No
pneumothorax. There is cardiomegaly with pulmonary venous
hypertension. There are small pleural effusions bilaterally. There
are areas of patchy atelectasis in both mid and lower lung zones.
There is no frank edema or consolidation. There is aortic
atherosclerosis. No adenopathy. No bone lesions evident.
IMPRESSION: Endotracheal tube and nasogastric tube removed. No pneumothorax.
Pulmonary vascular congestion with small pleural effusions.
Scattered areas of atelectatic change bilaterally. No frank edema or
consolidation.
